# Patient Record
Sex: Female | Born: 1935
Health system: Southern US, Community
[De-identification: ages and names within clinical notes are randomized; demographics above are authoritative.]

## PROBLEM LIST (undated history)

## (undated) DIAGNOSIS — M4800 Spinal stenosis, site unspecified: Secondary | ICD-10-CM

## (undated) DIAGNOSIS — I493 Ventricular premature depolarization: Secondary | ICD-10-CM

## (undated) DIAGNOSIS — N1831 Chronic kidney disease, stage 3a: Secondary | ICD-10-CM

## (undated) DIAGNOSIS — R0600 Dyspnea, unspecified: Secondary | ICD-10-CM

## (undated) DIAGNOSIS — G629 Polyneuropathy, unspecified: Secondary | ICD-10-CM

## (undated) DIAGNOSIS — I447 Left bundle-branch block, unspecified: Secondary | ICD-10-CM

## (undated) DIAGNOSIS — E7800 Pure hypercholesterolemia, unspecified: Secondary | ICD-10-CM

## (undated) DIAGNOSIS — R7303 Prediabetes: Secondary | ICD-10-CM

## (undated) DIAGNOSIS — C801 Malignant (primary) neoplasm, unspecified: Secondary | ICD-10-CM

## (undated) DIAGNOSIS — T8859XA Other complications of anesthesia, initial encounter: Secondary | ICD-10-CM

## (undated) DIAGNOSIS — Z95810 Presence of automatic (implantable) cardiac defibrillator: Secondary | ICD-10-CM

## (undated) DIAGNOSIS — I509 Heart failure, unspecified: Secondary | ICD-10-CM

## (undated) DIAGNOSIS — I5022 Chronic systolic (congestive) heart failure: Secondary | ICD-10-CM

## (undated) DIAGNOSIS — I428 Other cardiomyopathies: Secondary | ICD-10-CM

## (undated) DIAGNOSIS — K219 Gastro-esophageal reflux disease without esophagitis: Secondary | ICD-10-CM

## (undated) DIAGNOSIS — Z950 Presence of cardiac pacemaker: Secondary | ICD-10-CM

## (undated) DIAGNOSIS — I10 Essential (primary) hypertension: Secondary | ICD-10-CM

## (undated) DIAGNOSIS — R079 Chest pain, unspecified: Secondary | ICD-10-CM

## (undated) DIAGNOSIS — E039 Hypothyroidism, unspecified: Secondary | ICD-10-CM

## (undated) DIAGNOSIS — M48062 Spinal stenosis, lumbar region with neurogenic claudication: Secondary | ICD-10-CM

## (undated) DIAGNOSIS — T4145XA Adverse effect of unspecified anesthetic, initial encounter: Secondary | ICD-10-CM

## (undated) DIAGNOSIS — T82198A Other mechanical complication of other cardiac electronic device, initial encounter: Secondary | ICD-10-CM

## (undated) DIAGNOSIS — F32A Depression, unspecified: Secondary | ICD-10-CM

## (undated) DIAGNOSIS — M1990 Unspecified osteoarthritis, unspecified site: Secondary | ICD-10-CM

## (undated) HISTORY — PX: KNEE ARTHROSCOPY: SHX127

## (undated) HISTORY — DX: Spinal stenosis, lumbar region with neurogenic claudication: M48.062

## (undated) HISTORY — DX: Other cardiomyopathies: I42.8

## (undated) HISTORY — PX: VAGINAL HYSTERECTOMY: SUR661

## (undated) HISTORY — PX: FOOT ARTHRODESIS, MODIFIED MCBRIDE: SUR52

## (undated) HISTORY — PX: EXCISIONAL HEMORRHOIDECTOMY: SHX1541

## (undated) HISTORY — PX: APPENDECTOMY: SHX54

## (undated) HISTORY — DX: Unspecified osteoarthritis, unspecified site: M19.90

## (undated) HISTORY — DX: Chest pain, unspecified: R07.9

## (undated) HISTORY — DX: Pure hypercholesterolemia, unspecified: E78.00

## (undated) HISTORY — DX: Spinal stenosis, site unspecified: M48.00

## (undated) HISTORY — DX: Other mechanical complication of other cardiac electronic device, initial encounter: T82.198A

## (undated) HISTORY — DX: Essential (primary) hypertension: I10

## (undated) HISTORY — PX: TONSILLECTOMY: SUR1361

## (undated) HISTORY — PX: BLEPHAROPLASTY: SUR158

## (undated) HISTORY — DX: Left bundle-branch block, unspecified: I44.7

## (undated) HISTORY — DX: Gastro-esophageal reflux disease without esophagitis: K21.9

## (undated) HISTORY — DX: Hypothyroidism, unspecified: E03.9

## (undated) HISTORY — PX: TUBAL LIGATION: SHX77

---

## 1997-08-13 ENCOUNTER — Ambulatory Visit (HOSPITAL_COMMUNITY): Admission: RE | Admit: 1997-08-13 | Discharge: 1997-08-13 | Payer: Self-pay | Admitting: Family Medicine

## 1997-12-03 ENCOUNTER — Ambulatory Visit (HOSPITAL_COMMUNITY): Admission: RE | Admit: 1997-12-03 | Discharge: 1997-12-03 | Payer: Self-pay | Admitting: Gastroenterology

## 1998-12-19 ENCOUNTER — Encounter: Admission: RE | Admit: 1998-12-19 | Discharge: 1998-12-19 | Payer: Self-pay | Admitting: Family Medicine

## 1998-12-19 ENCOUNTER — Encounter: Payer: Self-pay | Admitting: Family Medicine

## 1999-01-05 ENCOUNTER — Encounter: Payer: Self-pay | Admitting: Emergency Medicine

## 1999-01-05 ENCOUNTER — Inpatient Hospital Stay (HOSPITAL_COMMUNITY): Admission: EM | Admit: 1999-01-05 | Discharge: 1999-01-07 | Payer: Self-pay | Admitting: Emergency Medicine

## 2000-05-28 ENCOUNTER — Emergency Department (HOSPITAL_COMMUNITY): Admission: EM | Admit: 2000-05-28 | Discharge: 2000-05-28 | Payer: Self-pay

## 2000-05-29 ENCOUNTER — Emergency Department (HOSPITAL_COMMUNITY): Admission: EM | Admit: 2000-05-29 | Discharge: 2000-05-29 | Payer: Self-pay | Admitting: Emergency Medicine

## 2000-06-01 ENCOUNTER — Ambulatory Visit (HOSPITAL_COMMUNITY): Admission: RE | Admit: 2000-06-01 | Discharge: 2000-06-01 | Payer: Self-pay | Admitting: Family Medicine

## 2000-06-01 ENCOUNTER — Encounter: Payer: Self-pay | Admitting: Family Medicine

## 2001-01-13 ENCOUNTER — Other Ambulatory Visit: Admission: RE | Admit: 2001-01-13 | Discharge: 2001-01-13 | Payer: Self-pay | Admitting: *Deleted

## 2001-02-16 ENCOUNTER — Encounter: Payer: Self-pay | Admitting: Family Medicine

## 2001-02-16 ENCOUNTER — Encounter: Admission: RE | Admit: 2001-02-16 | Discharge: 2001-02-16 | Payer: Self-pay | Admitting: Family Medicine

## 2001-03-09 ENCOUNTER — Ambulatory Visit (HOSPITAL_COMMUNITY): Admission: RE | Admit: 2001-03-09 | Discharge: 2001-03-09 | Payer: Self-pay | Admitting: Gastroenterology

## 2001-03-09 ENCOUNTER — Encounter (INDEPENDENT_AMBULATORY_CARE_PROVIDER_SITE_OTHER): Payer: Self-pay | Admitting: Specialist

## 2001-09-29 ENCOUNTER — Encounter: Payer: Self-pay | Admitting: Family Medicine

## 2001-09-29 ENCOUNTER — Ambulatory Visit (HOSPITAL_COMMUNITY): Admission: RE | Admit: 2001-09-29 | Discharge: 2001-09-29 | Payer: Self-pay | Admitting: Family Medicine

## 2002-09-20 ENCOUNTER — Encounter: Payer: Self-pay | Admitting: General Surgery

## 2002-09-21 ENCOUNTER — Encounter: Payer: Self-pay | Admitting: General Surgery

## 2002-09-21 ENCOUNTER — Ambulatory Visit (HOSPITAL_COMMUNITY): Admission: RE | Admit: 2002-09-21 | Discharge: 2002-09-21 | Payer: Self-pay | Admitting: General Surgery

## 2002-09-26 ENCOUNTER — Encounter (INDEPENDENT_AMBULATORY_CARE_PROVIDER_SITE_OTHER): Payer: Self-pay

## 2002-09-26 ENCOUNTER — Ambulatory Visit (HOSPITAL_COMMUNITY): Admission: RE | Admit: 2002-09-26 | Discharge: 2002-09-26 | Payer: Self-pay | Admitting: General Surgery

## 2002-12-21 ENCOUNTER — Encounter: Admission: RE | Admit: 2002-12-21 | Discharge: 2002-12-21 | Payer: Self-pay | Admitting: Family Medicine

## 2004-02-14 ENCOUNTER — Other Ambulatory Visit: Admission: RE | Admit: 2004-02-14 | Discharge: 2004-02-14 | Payer: Self-pay | Admitting: Family Medicine

## 2004-03-03 ENCOUNTER — Ambulatory Visit (HOSPITAL_COMMUNITY): Admission: RE | Admit: 2004-03-03 | Discharge: 2004-03-03 | Payer: Self-pay | Admitting: Gastroenterology

## 2004-03-04 ENCOUNTER — Encounter: Admission: RE | Admit: 2004-03-04 | Discharge: 2004-03-04 | Payer: Self-pay | Admitting: Family Medicine

## 2005-03-11 ENCOUNTER — Encounter: Admission: RE | Admit: 2005-03-11 | Discharge: 2005-03-11 | Payer: Self-pay | Admitting: Family Medicine

## 2005-04-02 ENCOUNTER — Other Ambulatory Visit: Admission: RE | Admit: 2005-04-02 | Discharge: 2005-04-02 | Payer: Self-pay | Admitting: Family Medicine

## 2005-04-23 ENCOUNTER — Inpatient Hospital Stay (HOSPITAL_COMMUNITY): Admission: EM | Admit: 2005-04-23 | Discharge: 2005-04-29 | Payer: Self-pay | Admitting: Emergency Medicine

## 2005-04-27 ENCOUNTER — Encounter (INDEPENDENT_AMBULATORY_CARE_PROVIDER_SITE_OTHER): Payer: Self-pay | Admitting: *Deleted

## 2005-04-29 ENCOUNTER — Encounter: Payer: Self-pay | Admitting: *Deleted

## 2005-06-20 ENCOUNTER — Emergency Department (HOSPITAL_COMMUNITY): Admission: EM | Admit: 2005-06-20 | Discharge: 2005-06-20 | Payer: Self-pay | Admitting: Emergency Medicine

## 2006-03-12 ENCOUNTER — Encounter: Admission: RE | Admit: 2006-03-12 | Discharge: 2006-03-12 | Payer: Self-pay | Admitting: Family Medicine

## 2006-11-03 ENCOUNTER — Inpatient Hospital Stay (HOSPITAL_COMMUNITY): Admission: RE | Admit: 2006-11-03 | Discharge: 2006-11-07 | Payer: Self-pay | Admitting: Neurosurgery

## 2006-11-03 HISTORY — PX: LUMBAR LAMINECTOMY: SHX95

## 2007-04-21 ENCOUNTER — Encounter: Admission: RE | Admit: 2007-04-21 | Discharge: 2007-04-21 | Payer: Self-pay | Admitting: Family Medicine

## 2008-04-20 ENCOUNTER — Encounter: Admission: RE | Admit: 2008-04-20 | Discharge: 2008-04-20 | Payer: Self-pay | Admitting: Gastroenterology

## 2008-04-23 ENCOUNTER — Encounter: Admission: RE | Admit: 2008-04-23 | Discharge: 2008-04-23 | Payer: Self-pay | Admitting: Family Medicine

## 2009-03-25 ENCOUNTER — Encounter: Admission: RE | Admit: 2009-03-25 | Discharge: 2009-03-25 | Payer: Self-pay | Admitting: Family Medicine

## 2009-03-28 ENCOUNTER — Encounter: Admission: RE | Admit: 2009-03-28 | Discharge: 2009-03-28 | Payer: Self-pay | Admitting: Family Medicine

## 2009-05-15 ENCOUNTER — Encounter: Admission: RE | Admit: 2009-05-15 | Discharge: 2009-05-15 | Payer: Self-pay | Admitting: Family Medicine

## 2010-01-02 ENCOUNTER — Ambulatory Visit
Admission: RE | Admit: 2010-01-02 | Discharge: 2010-01-02 | Payer: Self-pay | Source: Home / Self Care | Admitting: Orthopedic Surgery

## 2010-01-02 ENCOUNTER — Encounter (INDEPENDENT_AMBULATORY_CARE_PROVIDER_SITE_OTHER): Payer: Self-pay | Admitting: Orthopedic Surgery

## 2010-01-08 ENCOUNTER — Emergency Department (HOSPITAL_COMMUNITY)
Admission: EM | Admit: 2010-01-08 | Discharge: 2010-01-08 | Payer: Self-pay | Source: Home / Self Care | Admitting: Emergency Medicine

## 2010-02-22 ENCOUNTER — Encounter: Payer: Self-pay | Admitting: Family Medicine

## 2010-04-14 LAB — CBC
HCT: 40.5 % (ref 36.0–46.0)
Hemoglobin: 13.8 g/dL (ref 12.0–15.0)
MCH: 30.8 pg (ref 26.0–34.0)
MCHC: 34.1 g/dL (ref 30.0–36.0)
MCV: 90.4 fL (ref 78.0–100.0)
Platelets: 192 10*3/uL (ref 150–400)
RBC: 4.48 MIL/uL (ref 3.87–5.11)
RDW: 13.4 % (ref 11.5–15.5)
WBC: 5.5 10*3/uL (ref 4.0–10.5)

## 2010-04-14 LAB — URINALYSIS, ROUTINE W REFLEX MICROSCOPIC
Bilirubin Urine: NEGATIVE
Glucose, UA: NEGATIVE mg/dL
Hgb urine dipstick: NEGATIVE
Nitrite: NEGATIVE
pH: 5.5 (ref 5.0–8.0)

## 2010-04-14 LAB — URINE CULTURE

## 2010-04-14 LAB — COMPREHENSIVE METABOLIC PANEL
ALT: 19 U/L (ref 0–35)
AST: 21 U/L (ref 0–37)
Albumin: 4.1 g/dL (ref 3.5–5.2)
CO2: 27 mEq/L (ref 19–32)
Chloride: 105 mEq/L (ref 96–112)
Creatinine, Ser: 0.94 mg/dL (ref 0.4–1.2)
GFR calc Af Amer: >60 mL/min (ref >60)
GFR calc non Af Amer: 58 mL/min — ABNORMAL LOW (ref >60)
Potassium: 4.5 mEq/L (ref 3.5–5.1)
Sodium: 140 mEq/L (ref 135–145)
Total Bilirubin: 0.9 mg/dL (ref 0.3–1.2)

## 2010-04-14 LAB — DIFFERENTIAL
Basophils Absolute: 0 10*3/uL (ref 0.0–0.1)
Eosinophils Absolute: 0.1 10*3/uL (ref 0.0–0.7)
Eosinophils Relative: 1 % (ref 0–5)
Lymphocytes Relative: 40 % (ref 12–46)
Monocytes Absolute: 0.6 10*3/uL (ref 0.1–1.0)

## 2010-06-17 NOTE — H&P (Signed)
NAMELANAE, FEDERER NO.:  [id number]   MEDICAL RECORD NO.:  [id number]          PATIENT TYPE:  INP   LOCATION:  3172                         FACILITY:  [facility]   PHYSICIAN:  Hilda Lias, M.D.   DATE OF BIRTH:  [birth year]   DATE OF ADMISSION:  11/03/2006  DATE OF DISCHARGE:                              HISTORY & PHYSICAL   HISTORY:  [name] is a lady who had been complaining of back pain  that radiates to both legs.  The patient would sit down for long period  of time and associated with weakness when she walks.  The pain is  getting worse to the point that she had been gaining weight due to low  activity.  She complains of pain in both knees.   PAST MEDICAL HISTORY:  1. Tubal ligation.  2. Foot surgery.   ALLERGIES:  BENADRYL.   SOCIAL HISTORY:  Negative.   FAMILY HISTORY:  Unremarkable.   REVIEW OF SYSTEMS:  Positive for high blood pressure, high cholesterol,  thyroid disease.   PHYSICAL EXAMINATION:  HEENT:  Normal.  NECK:  Normal.  LUNGS: Clear.  HEART: Sounds normal.  ABDOMEN:  Normal.  EXTREMITIES:  Normal.  NEUROLOGIC:  Showed that she had weakness on dorsiflexion and palpation  of both legs.  Straight leg raising (SLR) is positive about 45 degrees  with stretch, positive bilateral.   X-RAYS:  Lumbar x-ray as well as the MRI showed that she has lumbar  stenosis, from L3 down to L5-S1.  Question about the level L2-L3.   CLINICAL IMPRESSION:  Lumbar stenosis with a neurogenic claudication.   RECOMMENDATIONS:  The patient being admitted for surgery.  The procedure  will be bilateral L3, L4 L5 and  probably L2 laminectomy with a  posterolateral arthrodesis and autograft.  The surgery was well  explained to her and her husband with the possibility of infection, CSF  leak, damage to the distal vertebra and infection, need for further  surgery, and no improvement whatsoever.           ______________________________  Hilda Lias,  M.D.     EB/MEDQ  D:  11/03/2006  T:  11/03/2006  Job:  [id number]

## 2010-06-17 NOTE — Op Note (Signed)
NAMEARIEAL, CUOCO NO.:  [id number]   MEDICAL RECORD NO.:  [id number]          PATIENT TYPE:  INP   LOCATION:  3172                         FACILITY:  [facility]   PHYSICIAN:  Hilda Lias, M.D.   DATE OF BIRTH:  February 23, 1932   DATE OF PROCEDURE:  11/03/2006  DATE OF DISCHARGE:                               OPERATIVE REPORT   PREOPERATIVE DIAGNOSIS:  Lumbar stenosis, L3-L5, with neurogenic  claudication.   POSTOPERATIVE DIAGNOSIS:  Lumbar stenosis, L3-L5, with neurogenic  claudication.   PROCEDURE:  Bilateral 3, 4, 5 laminectomy, partial L2, decompression of  the thecal sac, foraminotomy, posterolateral arthrodesis L3-S1 with  autograft.   SURGEON:  Hilda Lias, M.D.   ASSISTANT:  Danae Orleans. Venetia Maxon, M.D.   CLINICAL HISTORY:  Heather Smith is admitted because of back pain  radiating to both legs.  She has a typical neurogenic claudication.  X-  rays show stenosis of L3 down to L5-S1.  It was moderate between L2 and  23.  Surgery was advised.  The risks were explained to her in the  history and physical.   PROCEDURE:  The patient was taken to the OR, and she was positioned in  prone manner.  The skin was cleaned with DuraPrep.  Drapes were applied.  Midline incision from the L2-L3 down to L5-S1 was made.  Muscles were  retracted laterally until we were able to see the lateral aspect of the  facet.  From then on with the [place], we removed the spinous process of  5, 4, 3 and partial of 2.  With the drill, we did a laminectomy.  The  worst area of narrowing was between 4-5 and 3-4 to the point that the  dura mater was quite thin.  The patient had quite a bit of overgrowth of  the yellow ligament, and using the Kerrison punch, we were able to  remove the ligament.  At the end, we had good wide decompression.  We  looked at the level of L2-3, and we undermined the spinous process of  the lamina of L2, and we were able to decompress the thecal sac in that  area.   Then, using the 2- and 3-mm Kerrison punch, we decompressed the  3, 4, 5 and S1 nerve root.  Having good decompression, the same bone  that was removed from the spinous process was cleaned.  The lateral  aspect of the facet of 3-4, 4-5, 5-1 were removed from the periosteum,  and the bone graft was used for posterolateral arthrodesis bilaterally.  The area was irrigated.  A Valsalva maneuver was negative.  Fentanyl was  left in the epidural space, and the wound was closed with Vicryl and  Steri-Strips.           ______________________________  Hilda Lias, M.D.     EB/MEDQ  D:  11/03/2006  T:  11/03/2006  Job:  [id number]

## 2010-06-17 NOTE — Discharge Summary (Signed)
NAMEZIMAL, WEISENSEL NO.:  [id number]   MEDICAL RECORD NO.:  [id number]          PATIENT TYPE:  INP   LOCATION:  5124                         FACILITY:  [facility]   PHYSICIAN:  Coletta Memos, M.D.     DATE OF BIRTH:  Jun 27, 1932   DATE OF ADMISSION:  11/03/2006  DATE OF DISCHARGE:  11/07/2006                               DISCHARGE SUMMARY   ADMITTING DIAGNOSIS:  Lumbar stenosis with neurogenic claudication.   PROCEDURES:  1. Lumbar decompression, L2-L3.  2. L4-L5 laminectomy.  3. L3-S1 posterolateral arthrodesis with morselized autograft.   COMPLICATIONS:  None.   DISPOSITION:  Heather Smith will be discharged home today.  She is  voiding and ambulating without difficulty.  She has pain in the lumbar  spine, but that is to be expected.  She is otherwise doing well.  She  will have return appointment to see Dr. Venetia Maxon in 3-4 weeks.  Wound is  clean, dry and no signs of infection.           ______________________________  Coletta Memos, M.D.     KC/MEDQ  D:  11/07/2006  T:  11/08/2006  Job:  [id number]

## 2010-06-20 NOTE — Consult Note (Signed)
NAMEHARLY, PIPKINS              ACCOUNT NO.:  [id number]   MEDICAL RECORD NO.:  [id number]          PATIENT TYPE:  OBV   LOCATION:  1011                         FACILITY:  [facility]   PHYSICIAN:  Pramod P. Pearlean Brownie, MD    DATE OF BIRTH:  07/04/1932   DATE OF CONSULTATION:  DATE OF DISCHARGE:                                   CONSULTATION   REASON FOR REFERRAL:  Dizziness.   HISTORY OF PRESENT ILLNESS:  Mrs. Heather Smith is a 77 year old pleasant  Caucasian lady who developed sudden onset of dizziness, nausea and vomiting  and gait ataxia with leaning to the right side on Monday evening.  She  noticed this when she was getting ready for bed and turning to the right.  She denied true vertigo, blurred vision, double vision, or focal extremity  weakness.  When her husband helped her walk to the car to come to the  hospital, he noticed that she was leaning to the right.  Subsequently, also,  when she got several times to go to the restroom in the hospital, she was  leaning consistently to the right side.  She had been treated for the last  couple of days with meclizine and Valium which seems to be helping and today  her dizziness is much better.  She has been seen by ENT who did not find any  evidence of labyrinthitis.  She has no complaints of ringing in the ears,  decreased hearing, or previous history of vertigo.  She has no known history  of stroke, but does have vascular  type of hypertension, hyperlipidemia.   PAST MEDICAL HISTORY:  Hypothyroidism, arthritis, gastroesophageal reflux  disease, hypertension, hyperlipidemia.   HOME MEDICATIONS:  Altace, Synthroid, Mobic, and aspirin.   MEDICATION ALLERGIES:  BENADRYL.   SOCIAL HISTORY:  Patient is married, lives with her husband, does not smoke  or drink.   FAMILY HISTORY:  Significant for ischemic heart disease, but nobody with a  stroke.   REVIEW OF SYSTEMS:  Not significant for any chest pain, fever, cough,  shortness of breath,  diarrheal illness.   PHYSICAL EXAM:  GENERAL:  Physical exam reveals a pleasant, elderly  Caucasian lady who is not in distress.  VITAL SIGNS:  She is afebrile.  Pulse rate is 78 per minute, regular.  Respiratory 16 per minute.  Distal pulses are well felt.  HEENT:  Head is nontraumatic.  Neck is supple without bruit.  ENT EXAM:  At present, is unremarkable.  CARDIAC EXAM:  Regular heart sounds.  NEUROLOGICAL EXAM:  She is pleasant, awake, alert, cooperative.  There is no  aphasia, apraxia or dysarthria.  Pupils are equal, reactive.  Eye movements  are full range without any nystagmus.  Head shaking was also negative for  any dizziness or vertigo or nystagmus.  Face is symmetric.  Palatal  movements are normal.  Tongue is midline.  Motor system exam reveals no  upper extremity deficits; symmetric strength, tone, reflexes, coordination,  sensation.  Plantars are downgoing.  She walks with a slow, cautious gait.  She is not unsteady or narrow-based while walking  on her heels or toes.  She  can stand on either foot unsupported.   DATA REVIEW:  MRI scan of the brain done yesterday reveals no acute infarct.  Mild degree of nonspecific white matter microangiopathic changes and  generalized atrophy seen.  A CSF __________ lesion is seen in the subinsular  region, which may represent a benign CSF cyst.  A small lacunar infarct was  noted in the ventral left thalamus.   IMPRESSION:  [age]-year-old lady with sudden onset of gait imbalance,  nausea, leaning to the right, and headache possibly from a small right-sided  brainstem or cerebellar infarct not visualized on the MRI. Vascular risk  factors of age, sex, hypertension, hyperlipidemia, and previous silent  cerebrovascular disease.  The etiology is most likely small vessel disease  from above.   PLAN:  I would recommend further evaluation with MRI of the brain and neck  to rule out any high-grade intracranial or extracranial vascular  stenosis.  Strict control of hypertension with systolic blood pressure goals below 130,  and hyperlipidemia with total cholesterol goal below 200, and LDL goal below  100.  Change aspirin to Aggrenox for secondary stroke prevention.  Also,  repeat a limited diffusion weighted imaging, MRI scan of the brain, to look  for small infarct missed on previous MRI.  I had a long discussion with the  patient and her husband.  She is to continue meclizine for symptomatic  relief and taper it gradually over the next several weeks as tolerated.  She  may follow up with me relatively as an outpatient in 2 months or call  earlier if necessary.  Thank you for the referral.           ______________________________  Sunny Schlein. Pearlean Brownie, MD     PPS/MEDQ  D:  04/23/2005  T:  04/25/2005  Job:  [id number]

## 2010-06-20 NOTE — Procedures (Signed)
[facility]  Patient:    Heather Smith, Heather Smith Visit Number: [id number] MRN: [id number]          Service Type: END Location: ENDO Attending Physician:  Orland Mustard Dictated by:   Llana Aliment. Randa Evens, M.D. Proc. Date: 03/09/01 Admit Date:  03/09/2001   CC:         Arvella Merles, M.D.   Procedure Report  PROCEDURE PERFORMED:  Colonoscopy and coagulation of polyp.  ENDOSCOPIST:  Llana Aliment. Randa Evens, M.D.  MEDICATIONS USED:  Versed 70 mg, fentanyl 125 mcg IV  INSTRUMENT:  INDICATIONS:  Rectal bleeding.  Strong family history of colon cancer.  DESCRIPTION OF PROCEDURE:  The procedure had been explained to the patient and consent obtained.  With the patient in the left lateral decubitus position, the Olympus pediatric video colonoscope was inserted and advanced under direct visualization.  The prep was quite good and we were able to advance to the cecum without difficulty.  The ileocecal valve and appendiceal orifice were seen.  The scope was withdrawn.  The cecum, ascending colon, hepatic flexure, transverse colon, splenic flexure, descending colon were seen well well.  In the middescending colon, a polyp was seen, 2 to 3 mm in diameter and this was cauterized.  The remainder of the descending colon and sigmoid colon were seen well and were unremarkable.  There were internal hemorrhoids seen in the rectum upon removal of the scope.  The scope withdrawn, patient tolerated the procedure well.  Maintained on low flow oxygen and pulse oximeter throughout the procedure.  ASSESSMENT:  Ascending colon polyp removed.  PLAN: 1. Routine post polypectomy instructions. 2. Will recommend repeating in three years due to patients strong family    history of colon cancer and anticipation this will prove to be an    adenomatous polyp. Dictated by:   Llana Aliment. Randa Evens, M.D. Attending Physician:  Orland Mustard DD:  03/09/01 TD:  03/09/01 Job:  [id number]

## 2010-06-20 NOTE — H&P (Signed)
Heather Smith, Heather Smith              ACCOUNT NO.:  [id number]   MEDICAL RECORD NO.:  [id number]          PATIENT TYPE:  INP   LOCATION:  1428                         FACILITY:  [facility]   PHYSICIAN:  Melissa L. Ladona Ridgel, MD  DATE OF BIRTH:  [birth year]   DATE OF ADMISSION:  04/22/2005  DATE OF DISCHARGE:  04/29/2005                                HISTORY & PHYSICAL   CHIEF COMPLAINT ON ADMISSION:  Dizziness.   DISCHARGE DIAGNOSES:  1.  Possible small right cerebellar stroke.  This is based on an exam      finding by neurology as gait disturbance on the right with dizziness.      Concurrent MRIs failed to show a true defect.  This is presumption based      on physical examination and symptomatology at the time of her complaint.      The patient was seen and evaluated by ENT and neurology for      symptomatology.  ENT did not feel this was an inner ear issue and noted      that the patient's blood pressure was quite elevated during the      episodes.  Neurology has decided that this was probable right cerebellar      cerebrovascular accident with complications of not tolerating AGGRENOX.      She, therefore, will be sent home on aspirin and intensive blood      pressure control as well as cholesterol control.  She can follow up with      Dr. Pearlean Brownie in the outpatient setting in 3 to 4 weeks.  His phone number      has been provided.  2.  Severe headaches.  This is associated with hypertensive urgency.  A      formal workup, therefore, was undertaken.  The differential diagnoses      included the [facility] which was discontinued.  The patient was placed on      aspirin only for possible stroke. Further workup of the headache and      hypertensive urgency included MRA of the kidneys to rule out renal      artery stenosis.  This was negative.  Cardiolite stress testing was      negative, and 2-D echocardiogram showed no significant defects.  3.  Hypertensive urgency. As stated, her renal artery  stenosis workup was      negative.  Cardiolite was essentially negative.  I have increased ACE      inhibitor to twice daily dosing to better control her blood pressure.  I      would appreciate if she could have a creatinine drawn next week to make      sure that her renal function is okay on the extra ACE inhibitor.  I      would like her to limit her NSAIDs to prevent renal failure.  4.  Hypercholesterolemia. We have increased Zocor to 40 mg nightly.  That      will continue.  5.  Constipation. We will continue her on Metamucil and over-the-counter      stool softeners.  6.  Hypothyroidism. She is to continue Synthroid.  7.  Gastroesophageal reflux disease.  She will resume Aciphex at home.  8.  Chronic left bundle branch block.  Cardiolite stress testing was      negative, and she can follow up with Dr. Fraser Din as an outpatient as      needed.   DISCHARGE MEDICATIONS:  1.  Altace 10 mg twice daily.  2.  Synthroid 50 mcg daily.  3.  Aciphex 20 mg.  4.  Xanax 0.25 mg every 6 hours p.r.n.  5.  Mobic as previously. I have requested that she please hold the Mobic if      she is using Toradol, and I am limiting the Toradol to only 2 days'      worth of medicine.  I would like to decrease the NSAID effect on her      kidney function.  6.  Metamucil can be used as needed.  7.  Aspirin 325 mg once daily.  8.  Zocor 40 mg once at bedtime.  9.  Toradol 10 mg every 4 hours p.r.n. as needed for headache.  She is not      exceed continuous use for more than 2 days.  I will only be providing a      limited supply.  10. I have asked her to hold Requip until she sees Dr. Tiburcio Pea.   She is instructed to follow up with Dr. Tiburcio Pea next week as a post hospital  visit.  She is instructed to see Dr. Pearlean Brownie in 3 to 4 weeks, phone number [phone].  She is instructed to follow up with Dr. Fraser Din as needed.   SPECIAL INSTRUCTIONS:  If she has further headaches, she is to please  contact Dr. Tiburcio Pea'  office or come to the emergency room.   CONSULTATIONS:  Meade Maw, M.D.  Pramod P. Pearlean Brownie, M.D.  Antony Contras, M.D.   PERTINENT STUDIES:  The patient underwent MRA of the abdomen which shows no  evidence for renal artery stenosis and no aortic aneurysmal disease.   The patient underwent initially on admission an MRI of the brain which  showed mild atrophy, scattered lacunes, prominent perivascular space and  chronic microvascular ischemic changes.  No acute stroke, no abnormal  intracranial enhancement, no temporal bone or paranasal sinus inflammatory  process.   Her CT head also showed no acute finding.   Her repeat MRI with MRA again suggested a possible 50% stenosis of the left  vertebral artery.  The remainder of the examination demonstrated patency of  the vessels examined.  There were no other abnormalities noted on the MR  angiogram.  There was no evidence of effusion, stenosis, dissection, or  intracranial aneurysm.  Repeat study showed no interval development of acute  ischemia.   Ultrasound of the kidneys were completed which showed normal studies.   A 2-D echocardiogram was completed which showed overall left ventricular  systolic function with lower limits of normal.  The study was inadequate for  evaluation of wall motion.  There was moderate dyssynergic motion of the  intraventricular septum consistent with conduction abnormality or paced  rhythm.  No obvious source for embolism was noted.   HISTORY OF PRESENT ILLNESS:  The patient is a 77 year old white female who  presented to the emergency room when she developed the acute onset of  imbalance after turning in bed.  The patient states she sat up and felt  faint as if she  were going to fall.  From that point forwards, she was  unable to walk because of falling to the side, mainly to the right. The patient felt nauseated and vomited.  She said the room was spinning, and  then she described feeling faint.  The  patient was noted in the emergency  room to have a blood pressure of 196/110.  She denied any changes of ringing  or ear pain.  She also denied visual changes.  In the emergency room, the  patient was noted to be hypertensive.  She was admitted for further workup  of her symptomatology.   The patient underwent CT scan of the head which showed no acute findings and  subsequently MRI and MRA of the had and neck.  Please note, physical  examination did show imbalance to the right, suggesting possible right  cerebellar lesion.  MRI/MRA, however, did not confirm acute infarct.  The  patient, therefore, was diagnosed with a probable small not detected on scan  right cerebellar infarct.  She was placed on Aggrenox, and secondary causes  were pursued.   Initially, the patient did see ear, nose, and throat to rule out possible  acute labyrinthitis.  Determination was that this was likely not the case,  and that is when further neurological workup was undertaken.   By March 24, which is the third day of her admission, the patient had  developed the acute onset of severe crushing head pain with associated  nausea, vomiting, and dizziness.  She also displayed left eye ptosis with  the finding also of hypertensive urgency related to this.  The pain was  responsive to narcotics and anti-inflammatory pain medications; however, the  cyclic nature of the headaches caused some suspicion as to the source.  Because of the suspicious nature of her symptomatology, further workup was  undertaken.  MRA of the abdomen was done to rule out possible renal artery  stenosis causing slightly hypertensive changes resulting in headaches.  The  MR angiogram was negative for renal artery stenosis. Subsequently the  patient was noted on monitor to have left bundle branch block which was not  previously mentioned, and this was in conjunction with a bradyarrhythmia.  The patient, therefore, had Aggrenox discontinued.   Labetalol was  discontinued, and she underwent further cardiac evaluation.  A 2-D  echocardiogram showed, as stated, intraventricular dyssynergy, possibly  consistent with a conduction abnormality.  She underwent stress testing  which was negative for acute ischemia.  After the discontinuation of the  Aggrenox, the patient's headache symptoms seemed to improve slightly.  She  did get some premonition headaches which was able to be controlled with  Toradol. The patient subsequently seemed well enough to discharge to home to  follow up with a headache journal and her primary care physician.   On the day of discharge, the patient was clinically significantly improved.  Her temperature was 98, blood pressure 139/81 with the last hypertensive  reading on March 27 at 1700.  ON the day of discharge, the patient seemed tired but in good spirits.  She was normocephalic and atraumatic.  Pupils  equal, round, and reactive to light.  Extraocular muscles appeared to be  intact.  She did have slight ptosis of the left eye at baseline which was  much worse during her hypertensive and headache episodes.  Mucous membranes  moist.  Neck supple.  No JVD, no lymphadenopathy, no carotid bruits.  Her  chest was clear to auscultation. There was  no rhonchi, rales, or wheeze.  Cardiovascular was regular rate and rhythm, positive S1 and S2.  No S3, S4,  murmur, rub, or gallop.  Heart sounds are distant . Abdomen soft, nontender,  nondistended with positive bowel sounds.  Extremities showed no clubbing,  cyanosis, or edema. Neurologically, the patient appeared grossly intact with  the exception of the left eye ptosis.   PERTINENT LABORATORY DATA:  The patient's TSH was 0.772.  Her discharging  BUN was 16 with a creatinine of 0.8, potassium of 3.8 and sodium of 141.  Homocysteine level was within normal limits at 10.7 . Lipid profile revealed  a total cholesterol of 108, triglycerides of 180, LDL of 132, HDL of  40.  Her hemoglobin A1c was within normal limits at 5.6.   At this time, the patient is deemed stable for discharge to follow up with  Dr. Tiburcio Pea, Dr. Pearlean Brownie, and [place] as directed.   DISPOSITION:  To home with her spouse.      Melissa L. Ladona Ridgel, MD  Electronically Signed     MLT/MEDQ  D:  04/29/2005  T:  04/29/2005  Job:  [id number]

## 2010-06-20 NOTE — Op Note (Signed)
Heather Smith, Heather Smith                        ACCOUNT NO.:  [id number]   MEDICAL RECORD NO.:  [id number]                   PATIENT TYPE:  AMB   LOCATION:  DAY                                  FACILITY:  [facility]   PHYSICIAN:  Timothy E. Earlene Plater, M.D.              DATE OF BIRTH:  09-29-1932   DATE OF PROCEDURE:  09/26/2002  DATE OF DISCHARGE:                                 OPERATIVE REPORT   PREOPERATIVE DIAGNOSIS:  Internal/external hemorrhoids.   POSTOPERATIVE DIAGNOSIS:  Internal/external hemorrhoids.   PROCEDURE:  Hemorrhoidectomy.   SURGEON:  Timothy E. Earlene Plater, M.D.   ANESTHESIA:  General.   INDICATIONS FOR PROCEDURE:  Ms. [name] is a 77 year old, otherwise healthy  with controlled hypertension, some arthritic complaints and hypothyroidism.  She has had hemorrhoids for years intermittently, and because of progressive  disease with prolapse and bleeding she wishes to proceed with surgical  repair (as has been carefully discussed with her and her husband).  She has  been treated in the office conservatively.  She does have a current  colonoscopy.   DESCRIPTION OF PROCEDURE:  The patient was identified, permit signed,  evaluated by anesthesia.   She was taken to the operating room and placed supine.  General endotracheal  anesthesia was administered.  She was placed in the lithotomy position;  prepped and draped in the usual fashion.  Hemorrhoids were prominent in the  left lateral and right anterior positions.  There was a third-degree  internal right posterior and an additional tag in the left posterior  position externally.   The anus was gently dilated to accept the operating anoscope, and then  Marcaine 0.25% with epinephrine mixed 9:1 with Wydase was injected round  about the anal orifice and massaged in well.  The large prolapsing right  anterior complex was removed as a single column.  The left posterior third-  degree hemorrhoid was removed as a complete column.   The left lateral  internal hemorrhoid was band ligated.  The right posterior internal  hemorrhoid was band ligated, and all four external tags were simply excised.   The two full-column excisions were closed with a running 2-0 Chromic to the  anoderm, and then all four of the external tag sites were closed with  running 4-0 Chromic.  The results were very satisfactory.  The sphincter was  not injured and was intact, and there was no bleeding.  Gelfoam gauze and a  dry sterile dressing applied.  She tolerated it well and was removed to the  recovery room in good condition.   Written and verbal instructions were given.  She will be seen and followed  in the office.  This included Percocet #[id number].  Timothy E. Earlene Plater, M.D.    TED/MEDQ  D:  09/26/2002  T:  09/26/2002  Job:  [id number]   cc:   Holley Bouche, M.D.  [address]  [place], [place] [address]  Fax: [phone]

## 2010-06-20 NOTE — Consult Note (Signed)
NAMETARIN, JOHNDROW NO.:  [id number]   MEDICAL RECORD NO.:  [id number]          PATIENT TYPE:  OBV   LOCATION:  0105                         FACILITY:  [facility]   PHYSICIAN:  Antony Contras, MD     DATE OF BIRTH:  12-19-1932   DATE OF CONSULTATION:  04/22/2005  DATE OF DISCHARGE:                                   CONSULTATION   REQUESTING SERVICE:  Hospitalist service.   CHIEF COMPLAINT:  Dizziness.   HISTORY OF PRESENT ILLNESS:  The patient is a 77 year old white female who  felt at her normal state of health until last evening when she acutely  developed imbalance after turning in bed.  She sat up in bed and felt faint,  like she was going to fall.  From that point forward, she has been unable to  walk because of imbalance.  Last evening, she felt nauseated and did vomit.  She denies a room-spinning sensation throughout this problem.  She does  describe poor balance and also feeling like she is going to faint.  She had  a head cold a couple of weeks ago that has resolved.  She denies hearing  changes, ringing in her ear, or ear pain.  She also denies visual changes.  She states that her blood pressure was 196/110 during her ambulance ride.   PAST MEDICAL HISTORY:  1.  Hypertension.  2.  Hypercholesterolemia.  3.  GERD.  4.  Thyroid disorder.   PAST SURGICAL HISTORY:  1.  Arthroscopic knee surgery.  2.  Tubal ligation.   MEDICATIONS:  1.  Altace.  2.  Fish oil.  3.  Supplements.  4.  Vitamins.  5.  Synthroid.   ALLERGIES:  BENADRYL causes jerkiness.   FAMILY HISTORY:  Her mother died of lung cancer and also heart attack.  She  had hypertension.  Her father died of an aneurysm.  She had two brothers  with cancer.  Other cancers in the family include colon and thyroid cancer.   SOCIAL HISTORY:  The patient lives in [place] with her husband.  She does  not smoke.  Does not drink alcohol.  Denies drug use.  She works for a  [employer]   REVIEW OF SYSTEMS:  Systems are otherwise negative except as in the HPI,  aside from the recent event of her husband having a massive heart attack  nine days ago.   PHYSICAL EXAMINATION:  VITAL SIGNS:  Temperature 97.6, pulse 66,  respirations 18, blood pressure 122/74.  Sat 97%.  GENERAL:  The patient is alert in no acute distress.  She is fully oriented  and calm.  EYES:  Extraocular movements are intact.  Pupils are equal, round and  reactive to light.  SKIN:  There are no skin lesions noted on the head and neck.  ORAL CAVITY:  Patient has no lesions in the mouth with extensive dental work  and good dentition otherwise.  She has a venous lake on her upper lip.  NOSE:  Nasal passages are patent without lesion.  Septum is relatively  midline.  EARS:  Tympanic membranes are intact with normal external auditory canals.  External ears are normal.  Middle ears are aerated.  NECK:  There are no masses or lymphadenopathy in the neck.  She has no neck  tenderness.   LABS:  White blood count 7.5, hemoglobin 13.4, platelets 196.  Sodium 139,  potassium 4.3, chloride 106, bicarb 28, BUN 22, creatinine 0.8, glucose 129,  calcium 8.8.   MRI of the head was performed, and the report describes no evidence of acute  stroke or tumor.  The ears and temporal bones are normal on the MRI.   ASSESSMENT:  The patient is a 77 year old white female with acute onset of  imbalance with vomiting last night.  This occurred with the recent history  of her husband having a massive MI.   PLAN:  By the way she describes her symptoms, this does not sound like an  inner ear problem.  Typically, inner ear vertigo is associated with the room-  spinning sensation.  She describes no trouble with hearing changes as well.  I am skeptical that her symptoms are coming from her inner ear.  Other  sources for her symptoms should be investigated further.  If further workup  is to be done, this may include a hearing test  that can be performed as an  outpatient.  I am not convinced that this will be fruitful without any  complaint about her hearing.  If symptoms persist, followup can be  scheduled.      Antony Contras, MD  Electronically Signed     DDB/MEDQ  D:  04/22/2005  T:  04/23/2005  Job:  [id number]

## 2010-06-20 NOTE — H&P (Signed)
NAMESUMER, MOOREHOUSE NO.:  [id number]   MEDICAL RECORD NO.:  [id number]          PATIENT TYPE:  EMS   LOCATION:  ED                           [facility]ACILITY[facility]  [facility]   PHYSICIAN:  Corinna L. Lendell Caprice, MDDATE OF BIRTH:  1932-10-04   DATE OF ADMISSION:  04/22/2005  DATE OF DISCHARGE:                                HISTORY & PHYSICAL   CHIEF COMPLAINT:  Dizziness.   HISTORY OF PRESENT ILLNESS:  Ms. Novitski is a pleasant 77 year old white  female patient of Dr. Tiburcio Pea who presents to the emergency room with sudden  onset of vertigo. She also vomited at home. She was given antiemetics and  meclizine here and feels a little bit better but is unable to ambulate.  She  had a cold recently.  She has no history of stroke.  She has no other  symptoms.   PAST MEDICAL HISTORY:  1.  Hypertension.  2.  Hypothyroidism.  3.  Arthritis.  4.  Gastroesophageal reflux disease.   SOCIAL HISTORY:  Patient is here with her husband and family members. She  does not smoke, drink or have any drug history.   FAMILY HISTORY:  Her brother died of lung cancer.  Her other brother died of  colon cancer.  Her mother died of thyroid cancer.  Her father died of a  thoracic aortic aneurysm complications.   REVIEW OF SYSTEMS:  As above, otherwise negative.   PHYSICAL EXAMINATION:  VITAL SIGNS:  Temperature 97.2, blood pressure  154/98, pulse 74, respiratory rate 20, oxygen saturation 95% on room air.  GENERAL APPEARANCE:  The patient is somewhat uncomfortable-appearing quiet  female.  HEENT:  Normocephalic, atraumatic. Pupils equal, round, reactive to light.  Tympanic membranes are clear.  No nystagmus.  Extraocular movements are  intact. Mucous membranes moist.  NECK:  Supple, no carotid bruits.  No thyromegaly.  LUNGS:  Clear to auscultation bilaterally without wheezes, rhonchi or rales.  CARDIOVASCULAR:  Regular rate and rhythm without murmurs, rubs or gallops.  ABDOMEN:  Normal bowel  sounds, soft, nontender, nondistended.  GENITOURINARY/RECTAL:  Examination's were deferred.  EXTREMITIES:  No cyanosis, clubbing or edema.  NEUROLOGICAL:  Oriented.  Cranial nerves II-XII intact.  Motor strength 5/5  throughout.  Finger-to-nose intact.  I did not stand her up to test Romberg  as she was so symptomatic.  PSYCHIATRIC:  Normal affect.   LABORATORY DATA:  CBC unremarkable.  Complete metabolic panel unremarkable.  Wet reading of the CT scan brain without contrast showed nothing acute.   ASSESSMENT/PLAN:  1.  Vertigo.  I suspect this is labyrinthitis, however, due to her age and      history of hypertension, I will also get an MRI of the brain to rule out      posterior circulation stroke.  She will be placed on 23 hour observation      and supportive care with Meclizine, antiemetics and benzodiazepine's as      needed.  She will also give intravenous hydration.  Diet as tolerated.  2.  Hypertension.  3.  Hypothyroidism.  4.  Gastroesophageal reflux disease.  Corinna L. Lendell Caprice, MD  Electronically Signed     CLS/MEDQ  D:  04/22/2005  T:  04/22/2005  Job:  [id number]   cc:   Dr. Loralee Pacas, [place]. Pract.

## 2010-06-20 NOTE — Op Note (Signed)
Heather Smith, [place]              ACCOUNT NO.:  [id number]   MEDICAL RECORD NO.:  [id number]          PATIENT TYPE:  AMB   LOCATION:  ENDO                         FACILITY:  [facility]   PHYSICIAN:  James L. Malon Kindle., M.D.DATE OF BIRTH:  1932/09/30   DATE OF PROCEDURE:  03/03/2004  DATE OF DISCHARGE:                                 OPERATIVE REPORT   PROCEDURE:  Colonoscopy.   MEDICATIONS:  Fentanyl 100 mcg, Versed 8.5 mg IV.   SCOPE:  Olympus pediatric adjustable colonoscope.   INDICATIONS:  History of previous polyps and a strong family history of  colon cancer.   DESCRIPTION OF PROCEDURE:  The procedure has been explained to the patient  and consent obtained.  With the patient in the left lateral decubitus  position, the Olympus scope was inserted and advanced.  The prep was  excellent.  I was able to reach the cecum without difficulty.  The ileocecal  valve and appendiceal orifice seen.  The scope withdrawn in the cecum.  The  ascending colon, transverse colon, descending and sigmoid colon were seen  well.  No significant diverticular disease.  No polyps seen.  The scope was  withdrawn.  The patient tolerated the procedure well.   ASSESSMENT:  Previous history of colon polyps, a strong family history of  colon cancer with negative colonoscopy at this time.  V12.72, V16.0.   PLAN:  Will recommend yearly Hemoccults and repeat colonoscopy in five  years.      JLE/MEDQ  D:  03/03/2004  T:  03/03/2004  Job:  [id number]   cc:   Holley Bouche, M.D.  [address]  [place], [place] [address]  Fax: [phone]

## 2010-06-20 NOTE — Consult Note (Signed)
[name], Smith NO.:  [id number]   MEDICAL RECORD NO.:  [id number]          PATIENT TYPE:  INP   LOCATION:  1428                         FACILITY:  [facility]   PHYSICIAN:  Meade Maw, M.D.    DATE OF BIRTH:  06/14/1932   DATE OF CONSULTATION:  DATE OF DISCHARGE:                                   CONSULTATION   INDICATION FOR CONSULT:  Abnormal EKG, abnormal rhythm.   HISTORY:  Heather Smith is a very pleasant, 77 year old female, who  presented on April 22, 2005 with sudden onset of dizziness, nausea, and  vomiting, initially felt to be related to inner ear.  Neurology consult was  obtained.  They felt that the most likely etiology was small vessel disease.  The patient has a history of hypertension for several years.  She  subsequently underwent MRA of her head.  There was no evidence of occlusion,  stenosis, dissection.  She was noted to have aneurysms of 5 mm or less.  MRI  of her brain revealed mild atrophy, scattered lacunars in some perivascular  spaces and chronic microvascular ischemic changes throughout the white  matter, related to hypertension.  The patient was noted to have a left  bundle-branch block and abnormal heart rhythm.  Cardiology consult was  subsequently obtained.  The patient has had no chest pain. 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  She was not involved in a  regular exercise program.  Her most strenuous activity was walking.  She had  no chest pain with this.  She states that her blood pressure has been well  controlled until this hospitalization.  The patient notes that she has had  increased stress at home.  Her husband had suffered a myocardial infarction  on April 12, 2005.  Her coronary risk factors are significant for  hypertension and dyslipidemia.   PAST MEDICAL HISTORY:  Significant for hypothyroidism, arthritis, GE reflux,  hypertension, dyslipidemia.   MEDICATIONS AT HOME PRIOR TO THIS  ADMISSION:  Altace, Synthroid, Mobic, and  aspirin.   CURRENT MEDICATIONS:  1.  Lovenox.  2.  Altace 10 mg daily.  3.  Synthroid 50 mg daily.  4.  Protonix 40 mg daily.  5.  Meclizine 50 mg p.o. q.8.  6.  Aggrenox has been discontinued.  7.  Tylenol p.r.n.  8.  Zocor 20 mg daily.  9.  Valium p.r.n.  10. Labetalol 5 mg IV q.4 p.r.n.  11. Toradol p.r.n.  12. Zofran p.r.n.   ALLERGIES:  BENADRYL.   SOCIAL HISTORY:  The patient is married.  She lives with her husband.  She  works in a [employer]  No history of tobacco, alcohol, or illicit  drug use.   FAMILY HISTORY:  Significant for ischemic heart disease and questionably  stroke.   REVIEW OF SYSTEMS:  She has had no chest pain, no fever, no cough, no  shortness of breath.  Activity was not limited prior to this presentation.   PHYSICAL EXAMINATION:  VITAL SIGNS:  Blood pressure has been ranging from  139-150.  She has  had isolated events of blood pressure of 190.  Diastolic  pressure is ranging 08-65.  Heart rate is ranging 57-68 beats per minute.  The patient is noted to have sinus rhythm with blocked PACs on her telemetry  accounting for the low heart rate.  HEENT:  Unremarkable.  She has good carotid upstrokes.  There are no carotid  bruits noted.  There is no neck vein distention noted.  PULMONARY EXAM:  Breath sounds which are equal and clear to auscultation.  There is no use of accessory muscles noted.  CARDIOVASCULAR EXAM:  A normal  S1.  There is a slight widened split S2.  There is a quiet systolic murmur noted.  Her PMI is not displaced.  ABDOMEN:  Soft, benign, nontender.  There are no unusual bruits or  pulsations noted.  EXTREMITIES:  No clubbing or edema.  SKIN:  Warm and dry.   LABORATORY DATA:  White count 5.5, hematocrit 36, platelet count 189,000.  Sodium 141, potassium 3.8, chloride 108, CO2 26, BUN 16, creatinine 0.8.  LDL 132, HDL 40.  Homocysteine 10.7.  ECG:  Left bundle-branch block,   otherwise normal  ECG.  Her ECG from August of 2004 was also reviewed.  At  this time, the patient had intraventricular conduction delay consistent with  left bundle-branch block.   IMPRESSION:  1.  Abnormal electrocardiograms.  This has been noted previously in August      of 2004, most likely related to her hypertension.  The patient is also      noted to have blocked PACs on her telemetry.  Serial cardiac enzymes      have not been performed.  Her transthoracic echocardiogram is pending.      At this time, would continue with her Altace at 10 mg daily.  Her blood      pressure appears to be adequately controlled with isolated spikes.  I      would not use labetalol at this time secondary to her bradycardia and      her PACs.  Should the patient require additional antihypertensive, I      would consider addition of Vasotec and the addition of      hydrochlorothiazide or a diuretic.  2.  Dyslipidemia.  The patient currently is on  Zocor.  Her LDL is noted to      be 132.  At this time, I would be most likely be more aggressive with      her LDL and increase her to Zocor 40 for an optimal goal of LDL of 100.  3.  Headaches.  I agree that are headaches are most likely related to her      Aggrenox.   Further recommendations pending the outcome of her echocardiogram and her  adenosine Cardiolite.      Meade Maw, M.D.  Electronically Signed     HP/MEDQ  D:  04/28/2005  T:  04/29/2005  Job:  [id number]   cc:   Molly Maduro A. Nicholos Johns, M.D.  Fax: [phone]

## 2010-06-20 NOTE — H&P (Signed)
[place]  Patient:    Heather Smith                      MRN: [id number] Adm. Date:  [id number] Attending:  Koren Bound CC:         Arvella Merles, M.D.             Alvia Grove., M.D.                         History and Physical  CHIEF COMPLAINT:  Chest pain.  HISTORY OF PRESENT ILLNESS:  This is a 77 year old Caucasian female admitted with chest pain.  She awoke at 0430 hours with severe precordial chest pain.  She had tingling in the left arm and was dyspneic.  She was frightened.  Her husband drove her to the emergency room. On the way in, she became worse and became nauseated, but no vomiting, and had profuse diaphoresis.  The husband was speeding, and a policeman pulled them over, and they transferred her to an ambulance to bring her the rest of the way to the hospital.  She has a past history of chest pain, but never anything this severe, and on March 26, 1998, had an adenosine Cardiolite by Dr. Vesta Mixer, Montez Hageman., which was negative for ischemia, and had an ejection fraction of 54%.  The patient does have risk factors for coronary artery disease, including a positive family history, and a history of elevated cholesterol.  She is not diabetic, and is not a smoker.  FAMILY HISTORY:  Reveals that her mother died of a myocardial infarction. Father died of a ruptured aneurysm.  Her brother had a ruptured aneurysm, but survived  surgery.  The family history is also positive for hypertension.  SOCIAL HISTORY:  She retired from [employer] and now works part-time at [employer]. he does not have any tobacco or alcohol usage.  ALLERGIES:  No known drug allergies.  CURRENT MEDICATIONS: 1. Estratest. 2. Synthroid. 3. Vioxx.  PAST MEDICAL HISTORY:  Positive for a hysterectomy and for knee surgery.  REVIEW OF SYSTEMS:  Gastrointestinal:  No known disease.  She had a negative upper GI one year ago.   Genitourinary:  Negative.  Respiratory:  Negative. Cardiovascular:  She had an ultrasound to rule out an abdominal aortic aneurysm  last month, and it was negative.  PHYSICAL EXAMINATION:  VITAL SIGNS:  Blood pressure 132/60, pulse 68 and regular, respirations normal.  SKIN:  Color is pale.  Skin is warm and dry.  HEENT/NECK:  Unremarkable.  The jugular venous pressure is normal.  Carotids are normal.  CHEST:  Clear.  HEART:  A normal first and second sound.  There is no murmur, gallop, rub, or click.  ABDOMEN:  Soft, without hepatosplenomegaly or masses.  EXTREMITIES:  No phlebitis or edema.  Pedal pulses are good.  Her electrocardiogram shows poor R-wave progression in V1 through V3, but no acute ST-T wave changes.  Chest x-ray is pending.  LABORATORY DATA:  Her initial CPK is 2.4, troponin is less than 0.03. Electrolytes are normal, with potassium of 3.6, BUN 20, creatinine 0.8, blood sugar 115.  DIAGNOSTIC IMPRESSION: 1. Chest pain, rule out myocardial infarction. 2. Compensated hypothyroidism. 3. Status post hysterectomy.  DISPOSITION:  Admit to telemetry.  Will start her on IV nitroglycerin, IV heparin. Serial enzymes and electrocardiograms will be obtained.   Will add a beta  blocker and continue with aspirin, and consider a cardiac catheterization in the a.m. by Dr. Elease Hashimoto.DD:  01/05/99 TD:  01/05/99 Job: [id number]

## 2010-08-18 ENCOUNTER — Encounter: Payer: Self-pay | Admitting: *Deleted

## 2010-08-22 ENCOUNTER — Encounter: Payer: Self-pay | Admitting: Cardiovascular Disease

## 2010-08-22 ENCOUNTER — Ambulatory Visit (INDEPENDENT_AMBULATORY_CARE_PROVIDER_SITE_OTHER): Payer: Medicare Other | Admitting: Cardiovascular Disease

## 2010-08-22 VITALS — BP 150/90 | HR 64 | Ht 67.5 in | Wt 185.6 lb

## 2010-08-22 DIAGNOSIS — E785 Hyperlipidemia, unspecified: Secondary | ICD-10-CM

## 2010-08-22 DIAGNOSIS — I10 Essential (primary) hypertension: Secondary | ICD-10-CM

## 2010-08-22 MED ORDER — HYDROCHLOROTHIAZIDE 25 MG PO TABS: 25.0000 mg | ORAL_TABLET | Freq: Every day | ORAL | Status: DC

## 2010-08-22 MED ORDER — POTASSIUM CHLORIDE ER 10 MEQ PO TBCR: 10.0000 meq | EXTENDED_RELEASE_TABLET | ORAL | Status: DC

## 2010-08-22 NOTE — Assessment & Plan Note (Signed)
We'll check a fasting lipid profile, basic metabolic profile, and hepatic profile today.

## 2010-08-22 NOTE — Progress Notes (Signed)
Heather Smith Date of Birth  1932-02-12 [facility] [address]     [address] [place], [place]  [address] [phone]  Fax  [phone]  History of Present Illness:  Heather Smith is 77 year old female with a history of hypertension, hypercholesterolemia, left bundle branch block, and hypothyroidism. She has done for a while since I last saw her a year ago. She has not had any episodes of chest pain or shortness of breath.  She does complain of some generalized fatigue. She's been under lots of stress due to family issues.  She has not been able to exercise much primarily due to knee pain.   Current Outpatient Prescriptions on File Prior to Visit  Medication Sig Dispense Refill  . Ascorbic Acid (VITAMIN C) 1000 MG tablet Take 1,000 mg by mouth daily.        . cholecalciferol (VITAMIN D) 1000 UNITS tablet Take 1,000 Units by mouth daily.        Marland Kitchen levothyroxine (SYNTHROID, LEVOTHROID) 50 MCG tablet Take 50 mcg by mouth daily.        Marland Kitchen lisinopril (PRINIVIL,ZESTRIL) 10 MG tablet Take 10 mg by mouth daily.        . Magnesium 250 MG TABS Take 250 mg by mouth daily.        . meloxicam (MOBIC) 15 MG tablet Take 15 mg by mouth daily.        . multivitamin (THERAGRAN) per tablet Take 1 tablet by mouth daily.        . nitroGLYCERIN (NITROSTAT) 0.4 MG SL tablet Place 0.4 mg under the tongue every 5 (five) minutes as needed.        Marland Kitchen omeprazole (PRILOSEC) 20 MG capsule Take 20 mg by mouth daily.          Allergies  Allergen Reactions  . Benadryl (Diphenhydramine Hcl)   . Crestor (Rosuvastatin Calcium)   . Lipitor (Atorvastatin Calcium)   . Pravastatin   . Simvastatin   . Niaspan (Niacin (Antihyperlipidemic))     High doses, causes hot flashes & aching     Past Medical History  Diagnosis Date  . Hypertension   . Hypercholesterolemia   . Left bundle branch block   . Hypothyroidism   . Osteoarthritis   . Spinal stenosis   . GERD (gastroesophageal  reflux disease)   . Vitamin D deficiency   . Neurogenic claudication due to lumbar spinal stenosis     Past Surgical History  Procedure Date  . Tubal ligation   . Foot arthrodesis, modified mcbride   . Knee arthroscopy     left  . Knee arthroscopy     right  . Cardiac catheterization 2000  . Ptca   . Lumbar laminectomy 11/03/2006     Bilateral 3, 4, 5 laminectomy, partial L2, decompression of the thecal sac, foraminotomy, posterolateral arthrodesis L3-S1 with autograft   -- SURGEON:  Hilda Lias, M.D.     History  Smoking status  . Never Smoker   Smokeless tobacco  . Not on file    History  Alcohol Use No    Family History  Problem Relation Age of Onset  . Heart attack Mother   . Thyroid disease Mother   . Lung cancer Mother   . Hyperlipidemia Mother   . Aneurysm Father   . Aneurysm Brother     has had an Aneurysm x3  . Colon cancer Brother   . Bone cancer Brother   . Lung  cancer Brother   . Stomach cancer Brother     Reviw of Systems:  Reviewed in the HPI.  All other systems are negative.  Physical Exam: BP 150/90  Pulse 64  Ht 5' 7.5" (1.715 m)  Wt 185 lb 9.6 oz (84.188 kg)  BMI 28.64 kg/m2 The patient is alert and oriented x 3.  The mood and affect are normal.   Skin: warm and dry.  Color is normal.    HEENT:   the sclera are nonicteric.  The mucous membranes are moist.  The carotids are 2+ without bruits.  There is no thyromegaly.  There is no JVD.    Lungs: clear.  The chest wall is non tender.    Heart: regular rate with a normal S1 and S2.  There are no murmurs, gallops, or rubs. The PMI is not displaced.     Abdomin: good bowel sounds.  There is no guarding or rebound.  There is no hepatosplenomegaly or tenderness.  There are no masses.   Extremities:  no clubbing, cyanosis, or edema.  The legs are without rashes.  The distal pulses are intact.   Neuro:  Cranial nerves II - XII are intact.  Motor and sensory functions are intact.    The  gait is normal.  Assessment / Plan:

## 2010-08-22 NOTE — Assessment & Plan Note (Signed)
Her blood pressure is moderately elevated today. For some reason she has discontinued her HCTZ and potassium. We have prescribed her 25 mg of HCTZ a day as well as K. Dur 10 mEq a day. We'll see her back in 12 months for followup office visit. We'll recheck laboratory bedtime.

## 2010-08-27 ENCOUNTER — Other Ambulatory Visit (INDEPENDENT_AMBULATORY_CARE_PROVIDER_SITE_OTHER): Payer: Medicare Other | Admitting: *Deleted

## 2010-08-27 LAB — BASIC METABOLIC PANEL
BUN: 34 mg/dL — ABNORMAL HIGH (ref 6–23)
CO2: 28 mEq/L (ref 19–32)
Calcium: 9.6 mg/dL (ref 8.4–10.5)
Chloride: 107 mEq/L (ref 96–112)
Creatinine, Ser: 1.2 mg/dL (ref 0.4–1.2)

## 2010-08-27 LAB — LIPID PANEL
Cholesterol: 175 mg/dL (ref 0–200)
LDL Cholesterol: 95 mg/dL (ref 0–99)
Total CHOL/HDL Ratio: 3

## 2010-08-27 LAB — HEPATIC FUNCTION PANEL
AST: 21 U/L (ref 0–37)
Alkaline Phosphatase: 69 U/L (ref 39–117)
Bilirubin, Direct: 0.1 mg/dL (ref 0.0–0.3)

## 2010-09-12 ENCOUNTER — Telehealth: Payer: Self-pay | Admitting: Cardiovascular Disease

## 2010-09-12 ENCOUNTER — Encounter: Payer: Self-pay | Admitting: Cardiology

## 2010-09-12 NOTE — Telephone Encounter (Signed)
Labs never reviewed/commented on.  Please advise

## 2010-09-12 NOTE — Telephone Encounter (Signed)
Pt wants to know the results of labs she had done on 0725 please call

## 2010-09-12 NOTE — Telephone Encounter (Signed)
Pt informed Dr Elease Hashimoto out of the office this week. Lab results to be reviewed by Dr. Elease Hashimoto then we will advise. Pt has no further questions.

## 2010-09-12 NOTE — Telephone Encounter (Signed)
Routed to wrong nurse, please fwd to amy sweeney

## 2010-09-15 ENCOUNTER — Other Ambulatory Visit: Payer: Self-pay | Admitting: Family Medicine

## 2010-09-15 DIAGNOSIS — Z1231 Encounter for screening mammogram for malignant neoplasm of breast: Secondary | ICD-10-CM

## 2010-09-16 NOTE — Progress Notes (Signed)
Patient called with lab results. Pt verbalized understanding. Pt decline setting up lab app currently, reminded next due 03/2011, Alfonso Ramus RN

## 2010-10-02 ENCOUNTER — Ambulatory Visit: Payer: Medicare Other

## 2010-10-10 ENCOUNTER — Ambulatory Visit
Admission: RE | Admit: 2010-10-10 | Discharge: 2010-10-10 | Disposition: A | Discharge status: Home / Self Care | Payer: Medicare Other | Source: Ambulatory Visit | Attending: Family Medicine | Admitting: Family Medicine

## 2010-10-10 DIAGNOSIS — Z1231 Encounter for screening mammogram for malignant neoplasm of breast: Secondary | ICD-10-CM

## 2010-10-13 ENCOUNTER — Other Ambulatory Visit: Payer: Self-pay | Admitting: Cardiology

## 2010-10-13 ENCOUNTER — Telehealth: Payer: Self-pay | Admitting: Cardiology

## 2010-10-13 MED ORDER — ROSUVASTATIN CALCIUM 5 MG PO TABS: ORAL_TABLET | ORAL | Status: DC

## 2010-10-13 NOTE — Telephone Encounter (Signed)
Med refill as advised per Dr Elease Hashimoto.

## 2010-10-13 NOTE — Telephone Encounter (Signed)
Spoke to the pt regarding request from pharmacy for Crestor. She is currently taking 1/2 of a 5 mg tablet. Crestor is listed as an allergy but she reports she does not tolerate high doses. She gets muscle aches. Informed the patient that I did not see it on her medication list that was reviewed with her at her July appt. She reports she is still taking the medication.  Awaiting Dr. Harvie Bridge approval for refill. If approved the pt uses [facility] and would like  30 day supply.

## 2010-10-13 NOTE — Telephone Encounter (Signed)
Go ahead and refill

## 2010-11-13 LAB — CBC
HCT: 39.7
MCHC: 35
MCV: 89.7
Platelets: 250
RDW: 12.8

## 2010-11-13 LAB — BASIC METABOLIC PANEL
BUN: 17
Chloride: 105
Glucose, Bld: 100 — ABNORMAL HIGH
Potassium: 4.7

## 2011-10-02 ENCOUNTER — Other Ambulatory Visit: Payer: Self-pay | Admitting: Family Medicine

## 2011-10-02 DIAGNOSIS — Z1231 Encounter for screening mammogram for malignant neoplasm of breast: Secondary | ICD-10-CM

## 2011-10-29 ENCOUNTER — Ambulatory Visit
Admission: RE | Admit: 2011-10-29 | Discharge: 2011-10-29 | Disposition: A | Discharge status: Home / Self Care | Payer: Medicare HMO | Source: Ambulatory Visit | Attending: Family Medicine | Admitting: Family Medicine

## 2011-10-29 DIAGNOSIS — Z1231 Encounter for screening mammogram for malignant neoplasm of breast: Secondary | ICD-10-CM

## 2012-05-05 ENCOUNTER — Telehealth: Payer: Self-pay | Admitting: Cardiovascular Disease

## 2012-05-05 NOTE — Telephone Encounter (Signed)
New problem   Pt is experiencing SOB for 2 months and want to speak to a nurse concerning this matter.

## 2012-05-05 NOTE — Telephone Encounter (Signed)
C/o being stressed out and no energy for 2 months, SOB with activities, has not taken bp in awhile but 2 months ago her dentist did and she thinks it was 70/50, told her that those numbers are very low and was she sure that was what was said, she said they told her her bp was low but unsure of numbers. Denies LE edema, no abd weight gain. occ CP mid chest when stressed but no other SX. Told her to see her pcp Dr Tiburcio Pea and he will refer her here if she is needed to be seen, she agreed to plan.

## 2012-05-16 ENCOUNTER — Other Ambulatory Visit: Payer: Self-pay | Admitting: Family Medicine

## 2012-05-16 ENCOUNTER — Encounter: Payer: Self-pay | Admitting: Nurse Practitioner

## 2012-05-16 ENCOUNTER — Ambulatory Visit (INDEPENDENT_AMBULATORY_CARE_PROVIDER_SITE_OTHER): Payer: Medicare HMO | Admitting: Nurse Practitioner

## 2012-05-16 ENCOUNTER — Ambulatory Visit
Admission: RE | Admit: 2012-05-16 | Discharge: 2012-05-16 | Disposition: A | Discharge status: Home / Self Care | Payer: Medicare HMO | Source: Ambulatory Visit | Attending: Family Medicine | Admitting: Family Medicine

## 2012-05-16 VITALS — BP 128/78 | HR 69 | Ht 67.0 in | Wt 186.8 lb

## 2012-05-16 DIAGNOSIS — R079 Chest pain, unspecified: Secondary | ICD-10-CM

## 2012-05-16 DIAGNOSIS — R05 Cough: Secondary | ICD-10-CM

## 2012-05-16 DIAGNOSIS — E785 Hyperlipidemia, unspecified: Secondary | ICD-10-CM

## 2012-05-16 DIAGNOSIS — R059 Cough, unspecified: Secondary | ICD-10-CM

## 2012-05-16 DIAGNOSIS — I10 Essential (primary) hypertension: Secondary | ICD-10-CM

## 2012-05-16 MED ORDER — NITROGLYCERIN 0.4 MG SL SUBL: 0.4000 mg | SUBLINGUAL_TABLET | SUBLINGUAL | Status: DC | PRN
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
## 2012-05-16 NOTE — Patient Instructions (Addendum)
NT[facility] RX SENT IN TODAY TO [facility]  Your physician has requested that you have a lexiscan myoview. For further information please visit [email or web address] Please follow instruction sheet, as given.  No changes with medications  PLEASE FOLLOW UP WITH DR. Elease Hashimoto IN 2 MONTHS

## 2012-05-26 ENCOUNTER — Ambulatory Visit (HOSPITAL_COMMUNITY): Payer: Medicare HMO | Attending: Cardiology | Admitting: Radiology

## 2012-05-26 VITALS — Ht 67.0 in | Wt 186.0 lb

## 2012-05-26 DIAGNOSIS — Z8249 Family history of ischemic heart disease and other diseases of the circulatory system: Secondary | ICD-10-CM | POA: Insufficient documentation

## 2012-05-26 DIAGNOSIS — R000 Tachycardia, unspecified: Secondary | ICD-10-CM | POA: Insufficient documentation

## 2012-05-26 DIAGNOSIS — E785 Hyperlipidemia, unspecified: Secondary | ICD-10-CM

## 2012-05-26 DIAGNOSIS — R0989 Other specified symptoms and signs involving the circulatory and respiratory systems: Secondary | ICD-10-CM | POA: Insufficient documentation

## 2012-05-26 DIAGNOSIS — I10 Essential (primary) hypertension: Secondary | ICD-10-CM | POA: Insufficient documentation

## 2012-05-26 DIAGNOSIS — R61 Generalized hyperhidrosis: Secondary | ICD-10-CM | POA: Insufficient documentation

## 2012-05-26 DIAGNOSIS — R079 Chest pain, unspecified: Secondary | ICD-10-CM

## 2012-05-26 DIAGNOSIS — I447 Left bundle-branch block, unspecified: Secondary | ICD-10-CM | POA: Insufficient documentation

## 2012-05-26 DIAGNOSIS — R0602 Shortness of breath: Secondary | ICD-10-CM

## 2012-05-26 DIAGNOSIS — R002 Palpitations: Secondary | ICD-10-CM | POA: Insufficient documentation

## 2012-05-26 DIAGNOSIS — R0609 Other forms of dyspnea: Secondary | ICD-10-CM | POA: Insufficient documentation

## 2012-05-26 DIAGNOSIS — R5381 Other malaise: Secondary | ICD-10-CM | POA: Insufficient documentation

## 2012-05-26 MED ORDER — TECHNETIUM TC 99M SESTAMIBI GENERIC - CARDIOLITE
33.0000 | Freq: Once | INTRAVENOUS | Status: AC | PRN
  Administered 2012-05-26: 33 via INTRAVENOUS

## 2012-05-26 MED ORDER — ADENOSINE (DIAGNOSTIC) 3 MG/ML IV SOLN
0.5600 mg/kg | Freq: Once | INTRAVENOUS | Status: AC
  Administered 2012-05-26: 47.4 mg via INTRAVENOUS

## 2012-05-26 MED ORDER — TECHNETIUM TC 99M SESTAMIBI GENERIC - CARDIOLITE
11.0000 | Freq: Once | INTRAVENOUS | Status: AC | PRN
  Administered 2012-05-26: 11 via INTRAVENOUS

## 2012-05-26 NOTE — Progress Notes (Signed)
[facility] SITE 3 NUCLEAR MED [address] [place], [place] [address] [phone]    Cardiology Nuclear Med Study  Heather Smith is a 79 y.o. female     MRN : [id number]     DOB: 06-04-1932  Procedure Date: 05/26/2012  Nuclear Med Background Indication for Stress Test:  Evaluation for Ischemia History: No prior known history of CAD, 1990's Cath: normal coronaries, '07 Echo: EF= lower limits of normal, and 3-07 Myocardial Perfusion Study-? Ischemia vs attenuation apical segment of anteromedial , EF=54% Cardiac Risk Factors: Family History - CAD, Hypertension, LBBB and Lipids  Symptoms: Chest pain with/without exertion (last occurrence last week), Diaphoresis, DOE, Fatigue, Fatigue with Exertion, Palpitations and Rapid HR   Nuclear Pre-Procedure Caffeine/Decaff Intake:  None > 12 HRS NPO After: 5:30AM   Lungs:  clear O2 Sat: 98% on room air. IV 0.9% NS with Angio Cath:  22g  IV Site: R Antecubital X 1, tolerated well IV Started by:  Irean Hong, RN  Chest Size (in):  40 Cup Size: C  Height: 5\' 7"  (1.702 m)  Weight:  186 lb (84.369 kg)  BMI:  Body mass index is 29.12 kg/(m^2). Tech Comments:  Lisinopril this am    Nuclear Med Study 1 or 2 day study: 1 day  Stress Test Type:  Adenosine  Reading MD: Marca Ancona, MD  Order Authorizing Provider:  Kristeen Miss, MD  Resting Radionuclide: Technetium 21m Sestamibi  Resting Radionuclide Dose: 11.0 mCi   Stress Radionuclide:  Technetium 20m Sestamibi  Stress Radionuclide Dose: 33.0 mCi           Stress Protocol Rest HR: 75 Stress HR: 91  Rest BP: 146/95 Stress BP: 149/91  Exercise Time (min): n/a METS: n/a   Predicted Max HR: 142 bpm % Max HR: 64.08 bpm Rate Pressure Product: 21308   Dose of Adenosine (mg):  47.3 Dose of Lexiscan: n/a mg  Dose of Atropine (mg): n/a Dose of Dobutamine: n/a mcg/kg/min (at max HR)  Stress Test Technologist: Irean Hong, RN  Nuclear Technologist:  Domenic Polite, CNMT      Rest Procedure:  Myocardial perfusion imaging was performed at rest 45 minutes following the intravenous administration of Technetium 92m Sestamibi. Rest ECG: NSR-LBBB  Stress Procedure:  The patient received IV adenosine at 140 mcg/kg/min for 4 minutes.  The patient complained of marked fatigue, and chest pain with adenosine.Technetium 19m Sestamibi was injected at the 2 minute mark and quantitative spect images were obtained after a 45 minute delay. Stress ECG: Frequent PVCs.  No other changes.   QPS Raw Data Images:  Normal; no motion artifact; normal heart/lung ratio. Stress Images:  Small, mild apical septal perfusion defect.  Rest Images:  Small, mild apical septal perfusion defect. Subtraction (SDS):  Fixed, small mild apical septal perfusion defect.  Transient Ischemic Dilatation (Normal <1.22):  1.05 Lung/Heart Ratio (Normal <0.45):  0.37  Quantitative Gated Spect Images QGS EDV:  n/a QGS ESV:  n/a  Impression Exercise Capacity:  Adenosine study with no exercise. BP Response:  Normal blood pressure response. Clinical Symptoms:  chest pain, flushing.  ECG Impression:  Baseline:  LBBB.  EKG uninterpretable due to LBBB at rest and stress.  Frequent PVCs.  Comparison with Prior Nuclear Study: Similar to report of prior nuclear study.   Overall Impression:  Low risk stress nuclear study.  There is a small, fixed mild apical septal perfusion defect.  This may be attenuation due to LBBB.  No ischemia.  LV Ejection Fraction: Study not gated.  LV Wall Motion:  Study not gated  [employer] 05/26/2012

## 2012-05-31 ENCOUNTER — Telehealth: Payer: Self-pay | Admitting: *Deleted

## 2012-05-31 NOTE — Telephone Encounter (Signed)
Advised patient

## 2012-05-31 NOTE — Telephone Encounter (Signed)
Message copied by Burnell Blanks on Tue May 31, 2012  9:05 AM ------      Message from: Vesta Mixer      Created: Fri May 27, 2012  2:38 PM       Low risk myoview. Continue current treatment ------

## 2012-07-26 ENCOUNTER — Encounter: Payer: Self-pay | Admitting: Cardiovascular Disease

## 2012-07-27 ENCOUNTER — Encounter: Payer: Self-pay | Admitting: Cardiovascular Disease

## 2012-07-29 ENCOUNTER — Ambulatory Visit: Payer: Medicare HMO | Admitting: Cardiovascular Disease

## 2012-10-03 ENCOUNTER — Observation Stay (HOSPITAL_COMMUNITY)
Admission: EM | Admit: 2012-10-03 | Discharge: 2012-10-05 | Disposition: A | Discharge status: Home / Self Care | Payer: Medicare HMO | Attending: Cardiology | Admitting: Cardiology

## 2012-10-03 ENCOUNTER — Emergency Department (HOSPITAL_COMMUNITY): Payer: Medicare HMO

## 2012-10-03 ENCOUNTER — Encounter (HOSPITAL_COMMUNITY): Payer: Self-pay | Admitting: Neurology

## 2012-10-03 DIAGNOSIS — R112 Nausea with vomiting, unspecified: Secondary | ICD-10-CM | POA: Insufficient documentation

## 2012-10-03 DIAGNOSIS — R55 Syncope and collapse: Secondary | ICD-10-CM | POA: Insufficient documentation

## 2012-10-03 DIAGNOSIS — M48062 Spinal stenosis, lumbar region with neurogenic claudication: Secondary | ICD-10-CM | POA: Insufficient documentation

## 2012-10-03 DIAGNOSIS — E785 Hyperlipidemia, unspecified: Secondary | ICD-10-CM

## 2012-10-03 DIAGNOSIS — R0989 Other specified symptoms and signs involving the circulatory and respiratory systems: Secondary | ICD-10-CM | POA: Insufficient documentation

## 2012-10-03 DIAGNOSIS — Z79899 Other long term (current) drug therapy: Secondary | ICD-10-CM | POA: Insufficient documentation

## 2012-10-03 DIAGNOSIS — E039 Hypothyroidism, unspecified: Secondary | ICD-10-CM | POA: Insufficient documentation

## 2012-10-03 DIAGNOSIS — I509 Heart failure, unspecified: Secondary | ICD-10-CM | POA: Insufficient documentation

## 2012-10-03 DIAGNOSIS — I10 Essential (primary) hypertension: Secondary | ICD-10-CM | POA: Diagnosis present

## 2012-10-03 DIAGNOSIS — M1990 Unspecified osteoarthritis, unspecified site: Secondary | ICD-10-CM | POA: Insufficient documentation

## 2012-10-03 DIAGNOSIS — I5022 Chronic systolic (congestive) heart failure: Secondary | ICD-10-CM | POA: Insufficient documentation

## 2012-10-03 DIAGNOSIS — R0609 Other forms of dyspnea: Secondary | ICD-10-CM | POA: Insufficient documentation

## 2012-10-03 DIAGNOSIS — R079 Chest pain, unspecified: Secondary | ICD-10-CM

## 2012-10-03 DIAGNOSIS — R072 Precordial pain: Principal | ICD-10-CM | POA: Insufficient documentation

## 2012-10-03 DIAGNOSIS — K219 Gastro-esophageal reflux disease without esophagitis: Secondary | ICD-10-CM | POA: Insufficient documentation

## 2012-10-03 DIAGNOSIS — E7800 Pure hypercholesterolemia, unspecified: Secondary | ICD-10-CM | POA: Insufficient documentation

## 2012-10-03 DIAGNOSIS — I447 Left bundle-branch block, unspecified: Secondary | ICD-10-CM | POA: Insufficient documentation

## 2012-10-03 DIAGNOSIS — I200 Unstable angina: Secondary | ICD-10-CM

## 2012-10-03 DIAGNOSIS — E559 Vitamin D deficiency, unspecified: Secondary | ICD-10-CM | POA: Insufficient documentation

## 2012-10-03 DIAGNOSIS — I428 Other cardiomyopathies: Secondary | ICD-10-CM

## 2012-10-03 LAB — CBC WITH DIFFERENTIAL/PLATELET
Eosinophils Absolute: 0.1 10*3/uL (ref 0.0–0.7)
Hemoglobin: 14 g/dL (ref 12.0–15.0)
Lymphocytes Relative: 29 % (ref 12–46)
Lymphs Abs: 1.6 10*3/uL (ref 0.7–4.0)
MCH: 30.7 pg (ref 26.0–34.0)
Monocytes Relative: 7 % (ref 3–12)
Neutro Abs: 3.6 10*3/uL (ref 1.7–7.7)
Neutrophils Relative %: 64 % (ref 43–77)
RBC: 4.56 MIL/uL (ref 3.87–5.11)
WBC: 5.7 10*3/uL (ref 4.0–10.5)

## 2012-10-03 LAB — TROPONIN I: Troponin I: <0.3 ng/mL (ref <0.30)

## 2012-10-03 LAB — URINALYSIS, ROUTINE W REFLEX MICROSCOPIC
Bilirubin Urine: NEGATIVE
Hgb urine dipstick: NEGATIVE
Ketones, ur: NEGATIVE mg/dL
Nitrite: NEGATIVE
Urobilinogen, UA: 0.2 mg/dL (ref 0.0–1.0)

## 2012-10-03 LAB — URINE MICROSCOPIC-ADD ON

## 2012-10-03 LAB — CBC
HCT: 42.1 % (ref 36.0–46.0)
MCV: 89 fL (ref 78.0–100.0)
Platelets: 172 10*3/uL (ref 150–400)
RBC: 4.73 MIL/uL (ref 3.87–5.11)
WBC: 6.7 10*3/uL (ref 4.0–10.5)

## 2012-10-03 LAB — PROTIME-INR
INR: 0.85 (ref 0.00–1.49)
Prothrombin Time: 11.5 seconds — ABNORMAL LOW (ref 11.6–15.2)

## 2012-10-03 LAB — COMPREHENSIVE METABOLIC PANEL
ALT: 13 U/L (ref 0–35)
Alkaline Phosphatase: 68 U/L (ref 39–117)
BUN: 25 mg/dL — ABNORMAL HIGH (ref 6–23)
CO2: 24 mEq/L (ref 19–32)
Chloride: 102 mEq/L (ref 96–112)
GFR calc Af Amer: 53 mL/min — ABNORMAL LOW (ref >90)
GFR calc non Af Amer: 46 mL/min — ABNORMAL LOW (ref >90)
Glucose, Bld: 139 mg/dL — ABNORMAL HIGH (ref 70–99)
Potassium: 4.5 mEq/L (ref 3.5–5.1)
Total Bilirubin: 0.4 mg/dL (ref 0.3–1.2)

## 2012-10-03 LAB — CREATININE, SERUM
GFR calc Af Amer: 67 mL/min — ABNORMAL LOW (ref >90)
GFR calc non Af Amer: 58 mL/min — ABNORMAL LOW (ref >90)

## 2012-10-03 MED ORDER — VITAMIN D3 25 MCG (1000 UNIT) PO TABS
1000.0000 [IU] | ORAL_TABLET | Freq: Every day | ORAL | Status: DC
  Administered 2012-10-04 – 2012-10-05 (×2): 1000 [IU] via ORAL
  Filled 2012-10-03 (×3): qty 1

## 2012-10-03 MED ORDER — HEPARIN SODIUM (PORCINE) 5000 UNIT/ML IJ SOLN
5000.0000 [IU] | Freq: Three times a day (TID) | INTRAMUSCULAR | Status: DC
  Administered 2012-10-03 – 2012-10-04 (×2): 5000 [IU] via SUBCUTANEOUS
  Filled 2012-10-03 (×5): qty 1

## 2012-10-03 MED ORDER — ASPIRIN EC 81 MG PO TBEC
81.0000 mg | DELAYED_RELEASE_TABLET | Freq: Every day | ORAL | Status: DC
  Administered 2012-10-05: 81 mg via ORAL
  Filled 2012-10-03: qty 1

## 2012-10-03 MED ORDER — SODIUM CHLORIDE 0.9 % IV SOLN
INTRAVENOUS | Status: DC
  Administered 2012-10-03: 23:00:00 via INTRAVENOUS

## 2012-10-03 MED ORDER — ACETAMINOPHEN 325 MG PO TABS
650.0000 mg | ORAL_TABLET | ORAL | Status: DC | PRN
  Administered 2012-10-04: 650 mg via ORAL

## 2012-10-03 MED ORDER — POLYVINYL ALCOHOL 1.4 % OP SOLN
2.0000 [drp] | Freq: Two times a day (BID) | OPHTHALMIC | Status: DC | PRN
  Administered 2012-10-03: 2 [drp] via OPHTHALMIC
  Filled 2012-10-03: qty 15

## 2012-10-03 MED ORDER — LISINOPRIL 10 MG PO TABS
10.0000 mg | ORAL_TABLET | Freq: Every day | ORAL | Status: DC
  Administered 2012-10-04 – 2012-10-05 (×2): 10 mg via ORAL
  Filled 2012-10-03 (×3): qty 1

## 2012-10-03 MED ORDER — POLYETHYL GLYCOL-PROPYL GLYCOL 0.4-0.3 % OP SOLN: 2.0000 [drp] | Freq: Two times a day (BID) | OPHTHALMIC | Status: DC | PRN

## 2012-10-03 MED ORDER — LEVOTHYROXINE SODIUM 50 MCG PO TABS
50.0000 ug | ORAL_TABLET | Freq: Every day | ORAL | Status: DC
  Administered 2012-10-04 – 2012-10-05 (×2): 50 ug via ORAL
  Filled 2012-10-03 (×3): qty 1

## 2012-10-03 MED ORDER — PANTOPRAZOLE SODIUM 40 MG PO TBEC
40.0000 mg | DELAYED_RELEASE_TABLET | Freq: Every day | ORAL | Status: DC
  Administered 2012-10-04 – 2012-10-05 (×2): 40 mg via ORAL
  Filled 2012-10-03 (×2): qty 1

## 2012-10-03 MED ORDER — NITROGLYCERIN 0.4 MG SL SUBL: 0.4000 mg | SUBLINGUAL_TABLET | SUBLINGUAL | Status: DC | PRN

## 2012-10-03 MED ORDER — ASPIRIN 81 MG PO CHEW
324.0000 mg | CHEWABLE_TABLET | ORAL | Status: AC
  Administered 2012-10-04: 324 mg via ORAL
  Filled 2012-10-03: qty 4

## 2012-10-03 MED ORDER — MULTIVITAMINS PO TABS: 1.0000 | ORAL_TABLET | Freq: Every day | ORAL | Status: DC

## 2012-10-03 MED ORDER — ALPRAZOLAM 0.25 MG PO TABS
0.2500 mg | ORAL_TABLET | Freq: Two times a day (BID) | ORAL | Status: DC | PRN
  Administered 2012-10-04: 0.25 mg via ORAL

## 2012-10-03 MED ORDER — ADULT MULTIVITAMIN W/MINERALS CH
1.0000 | ORAL_TABLET | Freq: Every day | ORAL | Status: DC
  Administered 2012-10-04 – 2012-10-05 (×2): 1 via ORAL
  Filled 2012-10-03 (×2): qty 1

## 2012-10-03 MED ORDER — ONDANSETRON HCL 4 MG/2ML IJ SOLN: 4.0000 mg | Freq: Four times a day (QID) | INTRAMUSCULAR | Status: DC | PRN

## 2012-10-03 NOTE — H&P (Signed)
Patient ID: Heather Smith MRN: [id number], DOB/AGE: [birth year]   Admit date: 10/03/2012  Primary Physician: Johny Blamer, MD Primary Cardiologist: Katherina Right, MD  Pt. Profile:  79 y/o female with h/o c/p and nl cath in the 90's and low-risk MV in April, who presented to the ED today following an episode of exertional chest pain associated with dyspnea, nausea, vomiting, and presyncope.  Problem List  Past Medical History  Diagnosis Date  . Hypertension   . Hypercholesterolemia   . Left bundle branch block   . Hypothyroidism   . Osteoarthritis   . Spinal stenosis   . GERD (gastroesophageal reflux disease)   . Vitamin D deficiency   . Neurogenic claudication due to lumbar spinal stenosis   . Chest pain     a. Nl cath in the 90's;  b. 04/2005: Myoview: subtle areao of ? reversibility in apical segment of anteromedial LV - ? attenuation, EF 54%;  c. 05/2012 MV: small, fixed mild apical septal perfusion defect, ? attenuation due to lbbb, no ischemia.    Past Surgical History  Procedure Laterality Date  . Tubal ligation    . Foot arthrodesis, modified mcbride    . Knee arthroscopy      left  . Knee arthroscopy      right  . Cardiac catheterization  2000  . Ptca    . Lumbar laminectomy  11/03/2006     Bilateral 3, 4, 5 laminectomy, partial L2, decompression of the thecal sac, foraminotomy, posterolateral arthrodesis L3-S1 with autograft   -- SURGEON:  Hilda Lias, M.D.     Allergies  Allergies  Allergen Reactions  . Statins Other (See Comments)    "Makes her bones hurt and very sore"  . Crestor [Rosuvastatin Calcium]   . Lipitor [Atorvastatin Calcium]   . Pravastatin   . Simvastatin   . Benadryl [Diphenhydramine Hcl] Other (See Comments)    Very jittery  . Niaspan [Niacin Er]     High doses, causes hot flashes & aching    HPI  79 y/o female with the above problem list.  She has a h/o chest pain dating back to the 1990's.  She underwent cath at that time, which  was reportedly nonobstructive.  In the spring of this year, she began to experience fatigue and also intermittent chest discomfort.  She underwent stress testing, which was felt to be low-risk and she was medically managed.  She did well following stress testing and hasn't been having much in the way of chest pain, though she has been dealing with right shoulder discomfort, which is worse with mvmt of the right arm.  She has also continued to have fatigue.  Today, she was out working in her yard, when she developed chest pressure associated with dyspnea and lightheadedness.  She went inside to alert her husband as the chest pressure worsened and he called EMS.  She felt presyncopal and laid down on the couch.  Her husband called EMS and on the operator's advice, he gave her 2 sl ntg w/o relief of chest pain.  He then gave 325mg  of ASA.  Upon drinking H2O to swallow the ASA, she became acutely nauseated and vomited.  After vomiting, she began feeling better, though continued to feel fatigued.  EMS arrived and she was taken into the [facility] ED.  Here, ECG shows chronic LBBB w/o acute changes.  Initial troponin is normal. BUN/Creat are mildly elevated.  She is currently pain free.  Home Medications  Prior  to Admission medications   Medication Sig Start Date End Date Taking? Authorizing Provider  Ascorbic Acid (VITAMIN C) 1000 MG tablet Take 1,000 mg by mouth daily.      Historical Provider, MD  Biotin (BIOTIN 5000) 5 MG CAPS Take 5,000 mg by mouth daily.    Historical Provider, MD  cholecalciferol (VITAMIN D) 1000 UNITS tablet Take 1,000 Units by mouth daily.      Historical Provider, MD  levothyroxine (SYNTHROID, LEVOTHROID) 50 MCG tablet Take 50 mcg by mouth daily.      Historical Provider, MD  lisinopril (PRINIVIL,ZESTRIL) 10 MG tablet Take 10 mg by mouth daily.      Historical Provider, MD  Magnesium 250 MG TABS Take 250 mg by mouth daily.      Historical Provider, MD  multivitamin [facility]) per tablet  Take 1 tablet by mouth daily.      Historical Provider, MD  nitroGLYCERIN (NITROSTAT) 0.4 MG SL tablet Place 1 tablet (0.4 mg total) under the tongue every 5 (five) minutes as needed. 05/16/12   Ok Anis, NP  omeprazole (PRILOSEC) 20 MG capsule Take 40 mg by mouth daily.     Historical Provider, MD  traMADol (ULTRAM) 50 MG tablet Take 50 mg by mouth as needed. 05/13/12   Historical Provider, MD  vitamin E 400 UNIT capsule Take 400 Units by mouth daily.    Historical Provider, MD   Family History  Family History  Problem Relation Age of Onset  . Heart attack Mother     died @ 54.  . Thyroid disease Mother   . Lung cancer Mother   . Hyperlipidemia Mother   . Aneurysm Father     died @ 13.  . Aneurysm Brother     has had an Aneurysm x3  . Colon cancer Brother   . Bone cancer Brother   . Lung cancer Brother   . Stomach cancer Brother    Social History  History   Social History  . Marital Status: Married    Spouse Name: N/A    Number of Children: N/A  . Years of Education: N/A   Occupational History  . Not on file.   Social History Main Topics  . Smoking status: Never Smoker   . Smokeless tobacco: Not on file  . Alcohol Use: No  . Drug Use: No  . Sexual Activity: Not on file   Other Topics Concern  . Not on file   Social History Narrative   Lives in [place], [place] with husband.  Active around the house.    Review of Systems General:  No chills, fever, night sweats or weight changes.  Cardiovascular:  +++ chest pain, dyspnea, lightheadedness/presyncope.  No edema, orthopnea, palpitations, paroxysmal nocturnal dyspnea. Dermatological: No rash, lesions/masses Respiratory: No cough, +++ dyspnea Urologic: No hematuria, dysuria Abdominal:   No nausea, vomiting, diarrhea, bright red blood per rectum, melena, or hematemesis Neurologic:  No visual changes, wkns, changes in mental status. All other systems reviewed and are otherwise negative except as noted  above.  Physical Exam  Blood pressure 127/72, pulse 73, temperature 98.4 F (36.9 C), temperature source Oral, resp. rate 15, SpO2 95.00%.  General: Pleasant, NAD Psych: Normal affect. Neuro: Alert and oriented X 3. Moves all extremities spontaneously. HEENT: Normal  Neck: Supple without bruits or JVD. Lungs:  Resp regular and unlabored, CTA. Heart: RRR no s3, s4, or murmurs. Abdomen: Soft, non-tender, non-distended, BS + x 4.  Extremities: No clubbing, cyanosis or edema. DP/PT/Radials  2+ and equal bilaterally.  Labs  Recent Labs  10/03/12 1612  TROPONINI <0.30   Lab Results  Component Value Date   WBC 5.7 10/03/2012   HGB 14.0 10/03/2012   HCT 40.8 10/03/2012   MCV 89.5 10/03/2012   PLT 164 10/03/2012     Recent Labs Lab 10/03/12 1611  NA 138  K 4.5  CL 102  CO2 24  BUN 25*  CREATININE 1.12*  CALCIUM 9.5  PROT 7.2  BILITOT 0.4  ALKPHOS 68  ALT 13  AST 23  GLUCOSE 139*   Lab Results  Component Value Date   CHOL 175 08/22/2010   HDL 51.50 08/22/2010   LDLCALC 95 08/22/2010   TRIG 142.0 08/22/2010   Radiology/Studies  Dg Chest Portable 1 View  10/03/2012   CLINICAL DATA:  Chest pain.  EXAM: PORTABLE CHEST - 1 VIEW  COMPARISON:  05/16/2012  FINDINGS: Heart is borderline in size. Minimal left base atelectasis. Right lung is clear. No effusions or acute bony abnormality.  IMPRESSION: Minimal left base atelectasis.   Electronically Signed   By: Charlett Nose   On: 10/03/2012 16:49   ECG  Rsr, 73, lbbb, no acute st/t changes.  ASSESSMENT AND PLAN  1.  [place]:  Pt presented following an episode of exertional chest pain associated with dyspnea, diaphoresis, nausea, and vomiting.  Initial troponin is normal.  ECG with chronic LBBB.  She is currently pain free.  We will plan to admit and cycle CE.  Add ASA.  Ss are concerning for angina.  She had a low-risk MV in April.  Will plan on diagnostic catheterization in the AM to define coronary anatomy.  2.  HTN:  Stable.    3.   Hypothyroidism:  Cont synthroid.  4.  Mild Renal Insuff:  Likely 2/2 dehydration in setting of working outside today.  Hydrate.  Signed, Nicolasa Ducking, NP 10/03/2012, 6:48 PM   History and all data above reviewed.  Patient examined.  I agree with the findings as above. She presents with a severe episode of chest pressure "like somebody sitting on me."  This is unlike her previous presentation earlier this year when she had a low risk perfusion study.  She has also had profound weakness for several months and decreased activity tolerance The patient exam reveals COR:RRR  ,  Lungs: Clear  ,  Abd: Positive bowel sounds, no rebound no guarding, Ext No edema  .  All available labs, radiology testing, previous records reviewed. Agree with documented assessment and plan. Given the recurrent presentation for chest pain and the moderate pretest probability for obstructive CAD with unstable angina cardiac cath is indicated.  The patient understands that risks included but are not limited to stroke (1 in 1000), death (1 in 1000), kidney failure [usually temporary] (1 in 500), bleeding (1 in 200), allergic reaction [possibly serious] (1 in 200).  The patient understands and agrees to proceed.   Fayrene Fearing Amina Menchaca  7:22 PM  10/03/2012

## 2012-10-03 NOTE — ED Notes (Signed)
Pt provided apple juice per cardiology who reports it is okay for her to eat and drink. Cardiology remains at bedside.

## 2012-10-03 NOTE — ED Notes (Signed)
Per EMS- Pt was outside painting, had sudden onset across her chest 10/10. Given 324 aspirin, took 2 nitro. Did have n/v/diaphoresis. When EMS arrived she pain painfree. 18 to left wrist saline lock. EKG showing LBBB with hx. HR 70's with frequent PVCs. BP 140/80. Pt is a x 4

## 2012-10-03 NOTE — ED Provider Notes (Signed)
CSN: [id number]     Arrival date & time 10/03/12  1549 History   First MD Initiated Contact with Patient 10/03/12 1559     Chief Complaint  Patient presents with  . Chest Pain   (Consider location/radiation/quality/duration/timing/severity/associated sxs/prior Treatment) HPI Comments: Patient presents with substernal chest discomfort onset while she was outside painting. This is substernal lasting 25 minutes and improved with nitroglycerin x2. It is associated with nausea diaphoresis and shortness of breath. She is now pain-free. Reports no cardiac history. Negative stress test this past April. She has not had this pain in the past. She denies any abdominal pain or back pain. She did have 2 episodes of vomiting with the pain today. She denies any cough or fever.  The history is provided by the patient.    Past Medical History  Diagnosis Date  . Hypertension   . Hypercholesterolemia   . Left bundle branch block   . Hypothyroidism   . Osteoarthritis   . Spinal stenosis   . GERD (gastroesophageal reflux disease)   . Vitamin D deficiency   . Neurogenic claudication due to lumbar spinal stenosis   . Chest pain     a. Nl cath in the 90's;  b. 04/2005: Myoview: subtle areao of ? reversibility in apical segment of anteromedial LV - ? attenuation, EF 54%;  c. 05/2012 MV: small, fixed mild apical septal perfusion defect, ? attenuation due to lbbb, no ischemia.   Past Surgical History  Procedure Laterality Date  . Tubal ligation    . Foot arthrodesis, modified mcbride    . Knee arthroscopy      left  . Knee arthroscopy      right  . Cardiac catheterization  2000  . Ptca    . Lumbar laminectomy  11/03/2006     Bilateral 3, 4, 5 laminectomy, partial L2, decompression of the thecal sac, foraminotomy, posterolateral arthrodesis L3-S1 with autograft   -- SURGEON:  Hilda Lias, M.D.    Family History  Problem Relation Age of Onset  . Heart attack Mother     died @ 61.  . Thyroid disease  Mother   . Lung cancer Mother   . Hyperlipidemia Mother   . Aneurysm Father     died @ 32.  . Aneurysm Brother     has had an Aneurysm x3  . Colon cancer Brother   . Bone cancer Brother   . Lung cancer Brother   . Stomach cancer Brother    History  Substance Use Topics  . Smoking status: Never Smoker   . Smokeless tobacco: Not on file  . Alcohol Use: No   OB History   Grav Para Term Preterm Abortions TAB SAB Ect Mult Living                 Review of Systems  Constitutional: Positive for activity change and appetite change. Negative for fever and fatigue.  HENT: Negative for congestion and rhinorrhea.   Respiratory: Positive for chest tightness and shortness of breath. Negative for cough.   Cardiovascular: Positive for chest pain.  Gastrointestinal: Positive for nausea and vomiting. Negative for abdominal pain.  Genitourinary: Negative for dysuria, hematuria, vaginal bleeding and vaginal discharge.  Musculoskeletal: Negative for myalgias, back pain and arthralgias.  Skin: Negative for rash.  Neurological: Positive for dizziness and light-headedness. Negative for headaches.  A complete 10 system review of systems was obtained and all systems are negative except as noted in the HPI and PMH.  Allergies  Niaspan; Statins; and Benadryl  Home Medications   Current Outpatient Rx  Name  Route  Sig  Dispense  Refill  . Ascorbic Acid (VITAMIN C) 1000 MG tablet   Oral   Take 1,000 mg by mouth at bedtime.          . Biotin (BIOTIN 5000) 5 MG CAPS   Oral   Take 5,000 mg by mouth daily.         . cholecalciferol (VITAMIN D) 1000 UNITS tablet   Oral   Take 1,000 Units by mouth daily.           Marland Kitchen Dextromethorphan-Guaifenesin (ROBITUSSIN COUGH+CHEST CONG DM) 10-200 MG CAPS   Oral   Take 1 capsule by mouth daily as needed (for cold).         Marland Kitchen levothyroxine (SYNTHROID, LEVOTHROID) 50 MCG tablet   Oral   Take 50 mcg by mouth daily.           Marland Kitchen lisinopril  (PRINIVIL,ZESTRIL) 10 MG tablet   Oral   Take 10 mg by mouth daily.           . Magnesium 250 MG TABS   Oral   Take 250 mg by mouth daily.          . meloxicam (MOBIC) 15 MG tablet   Oral   Take 15 mg by mouth daily.         . multivitamin (THERAGRAN) per tablet   Oral   Take 1 tablet by mouth daily.           . nitroGLYCERIN (NITROSTAT) 0.4 MG SL tablet   Sublingual   Place 1 tablet (0.4 mg total) under the tongue every 5 (five) minutes as needed.   25 tablet   3   . omeprazole (PRILOSEC) 20 MG capsule   Oral   Take 40 mg by mouth daily.          Bertram Gala Glycol-Propyl Glycol (SYSTANE) 0.4-0.3 % SOLN   Both Eyes   Place 2 drops into both eyes 2 (two) times daily as needed (for dry eyes).         . vitamin E 400 UNIT capsule   Oral   Take 400 Units by mouth daily.          BP 133/108  Pulse 50  Temp(Src) 98.4 F (36.9 C) (Oral)  Resp 17  SpO2 99% Physical Exam  Constitutional: She is oriented to person, place, and time. She appears well-developed and well-nourished. No distress.  HENT:  Head: Normocephalic and atraumatic.  Mouth/Throat: Oropharynx is clear and moist. No oropharyngeal exudate.  Eyes: Conjunctivae and EOM are normal. Pupils are equal, round, and reactive to light.  Neck: Normal range of motion. Neck supple.  Cardiovascular: Normal rate, regular rhythm and normal heart sounds.   No murmur heard. +2 DP, PT, radial pulses  Pulmonary/Chest: Effort normal and breath sounds normal. No respiratory distress.  Abdominal: Soft. There is no tenderness. There is no rebound and no guarding.  Musculoskeletal: Normal range of motion. She exhibits no edema and no tenderness.  Neurological: She is alert and oriented to person, place, and time. No cranial nerve deficit. She exhibits normal muscle tone. Coordination normal.  CN 2-12 intact, no ataxia on finger to nose, no nystagmus, 5/5 strength throughout, no pronator drift, Romberg negative,  normal gait.   Skin: Skin is warm.    ED Course  Procedures (including critical care time) Labs Review Labs Reviewed  COMPREHENSIVE METABOLIC PANEL - Abnormal; Notable for the following:    Glucose, Bld 139 (*)    BUN 25 (*)    Creatinine, Ser 1.12 (*)    GFR calc non Af Amer 46 (*)    GFR calc Af Amer 53 (*)    All other components within normal limits  URINALYSIS, ROUTINE W REFLEX MICROSCOPIC - Abnormal; Notable for the following:    APPearance HAZY (*)    Leukocytes, UA MODERATE (*)    All other components within normal limits  URINE MICROSCOPIC-ADD ON - Abnormal; Notable for the following:    Squamous Epithelial / LPF FEW (*)    Bacteria, UA FEW (*)    Casts HYALINE CASTS (*)    All other components within normal limits  URINE CULTURE  CBC WITH DIFFERENTIAL  TROPONIN I   Imaging Review Dg Chest Portable 1 View  10/03/2012   CLINICAL DATA:  Chest pain.  EXAM: PORTABLE CHEST - 1 VIEW  COMPARISON:  05/16/2012  FINDINGS: Heart is borderline in size. Minimal left base atelectasis. Right lung is clear. No effusions or acute bony abnormality.  IMPRESSION: Minimal left base atelectasis.   Electronically Signed   By: Charlett Nose   On: 10/03/2012 16:49    MDM   1. Chest pain   2. Chest pain on exertion    Episode of chest pain with nausea vomiting diaphoresis now resolved. EKG shows unchanged left bundle branch block.  Initial troponin negative. Patient chest pain-free after nitroglycerin. Her symptoms are concerning for angina. This was discussed with cardiology who has evaluated the patient and will admit her for catheterization tomorrow.   Date: 10/03/2012  Rate: 73  Rhythm: normal sinus rhythm  QRS Axis: normal  Intervals: normal  ST/T Wave abnormalities: nonspecific ST/T changes  Conduction Disutrbances:left bundle branch block  Narrative Interpretation:   Old EKG Reviewed: unchanged    Glynn Octave, MD 10/03/12 1934

## 2012-10-03 NOTE — ED Notes (Signed)
Pt undressed, in gown, on monitor, continuous pulse oximetry and blood pressure cuff; vitals and EKG performed 

## 2012-10-04 ENCOUNTER — Encounter (HOSPITAL_COMMUNITY)
Admission: EM | Disposition: A | Discharge status: Home / Self Care | Payer: Commercial Managed Care - HMO | Source: Home / Self Care | Attending: Emergency Medicine

## 2012-10-04 DIAGNOSIS — R079 Chest pain, unspecified: Secondary | ICD-10-CM

## 2012-10-04 HISTORY — PX: LEFT HEART CATHETERIZATION WITH CORONARY ANGIOGRAM: SHX5451

## 2012-10-04 LAB — CBC
HCT: 38.5 % (ref 36.0–46.0)
Hemoglobin: 13.7 g/dL (ref 12.0–15.0)
MCV: 87.7 fL (ref 78.0–100.0)
WBC: 4.4 10*3/uL (ref 4.0–10.5)

## 2012-10-04 LAB — CREATININE, SERUM
GFR calc Af Amer: 74 mL/min — ABNORMAL LOW (ref >90)
GFR calc non Af Amer: 64 mL/min — ABNORMAL LOW (ref >90)

## 2012-10-04 LAB — BASIC METABOLIC PANEL
CO2: 26 mEq/L (ref 19–32)
Calcium: 8.9 mg/dL (ref 8.4–10.5)
Chloride: 105 mEq/L (ref 96–112)
GFR calc Af Amer: 73 mL/min — ABNORMAL LOW (ref >90)
Sodium: 140 mEq/L (ref 135–145)

## 2012-10-04 SURGERY — LEFT HEART CATHETERIZATION WITH CORONARY ANGIOGRAM
Anesthesia: LOCAL

## 2012-10-04 MED ORDER — MIDAZOLAM HCL 2 MG/2ML IJ SOLN
INTRAMUSCULAR | Status: AC
  Filled 2012-10-04: qty 2

## 2012-10-04 MED ORDER — VERAPAMIL HCL 2.5 MG/ML IV SOLN
INTRAVENOUS | Status: AC
  Filled 2012-10-04: qty 2

## 2012-10-04 MED ORDER — NITROGLYCERIN 0.2 MG/ML ON CALL CATH LAB
INTRAVENOUS | Status: AC
  Filled 2012-10-04: qty 1

## 2012-10-04 MED ORDER — HEPARIN SODIUM (PORCINE) 5000 UNIT/ML IJ SOLN
5000.0000 [IU] | Freq: Three times a day (TID) | INTRAMUSCULAR | Status: DC
  Administered 2012-10-05: 5000 [IU] via SUBCUTANEOUS
  Filled 2012-10-04 (×4): qty 1

## 2012-10-04 MED ORDER — HEPARIN (PORCINE) IN NACL 2-0.9 UNIT/ML-% IJ SOLN
INTRAMUSCULAR | Status: AC
  Filled 2012-10-04: qty 1000

## 2012-10-04 MED ORDER — CARVEDILOL 6.25 MG PO TABS
6.2500 mg | ORAL_TABLET | Freq: Two times a day (BID) | ORAL | Status: DC
  Administered 2012-10-05: 6.25 mg via ORAL
  Filled 2012-10-04 (×3): qty 1

## 2012-10-04 MED ORDER — DOCUSATE SODIUM 100 MG PO CAPS
100.0000 mg | ORAL_CAPSULE | Freq: Every day | ORAL | Status: DC | PRN
  Administered 2012-10-04: 100 mg via ORAL
  Filled 2012-10-04: qty 1

## 2012-10-04 MED ORDER — SODIUM CHLORIDE 0.9 % IV SOLN: 1.0000 mL/kg/h | INTRAVENOUS | Status: AC

## 2012-10-04 MED ORDER — LIDOCAINE HCL (PF) 1 % IJ SOLN
INTRAMUSCULAR | Status: AC
  Filled 2012-10-04: qty 30

## 2012-10-04 MED ORDER — FENTANYL CITRATE 0.05 MG/ML IJ SOLN
INTRAMUSCULAR | Status: AC
  Filled 2012-10-04: qty 2

## 2012-10-04 MED ORDER — ALPRAZOLAM 0.25 MG PO TABS
ORAL_TABLET | ORAL | Status: AC
  Filled 2012-10-04: qty 1

## 2012-10-04 MED ORDER — NITROGLYCERIN 0.4 MG/SPRAY TL SOLN: 1.0000 | Status: DC | PRN

## 2012-10-04 MED ORDER — ZOLPIDEM TARTRATE 5 MG PO TABS
5.0000 mg | ORAL_TABLET | Freq: Every evening | ORAL | Status: DC | PRN
  Administered 2012-10-04: 5 mg via ORAL
  Filled 2012-10-04: qty 1

## 2012-10-04 NOTE — CV Procedure (Signed)
   Cardiac Catheterization Procedure Note  Name: Heather Smith MRN: [id number] DOB: 01-Oct-1932  Procedure: Left Heart Cath, Selective Coronary Angiography, LV angiography  Indication: 79 year old white female presented with an episode of prolonged chest pain concerning for unstable angina. She has a history of hypertension and a left bundle branch block. Myoview study in April 2014 showed no significant ischemia. It was not gated.   Procedural details: The right wrist was prepped, draped, and anesthetized with 1% lidocaine. Using a modified Seldinger technique we were able to access the right radial artery without difficulty. We were unable to pass the wire past the elbow. Angiography demonstrated a small caliber radial artery with a tight radial loop with very acute angulation into the brachial artery. I did not feel that we could negotiate this with a catheter without risk of damage to the artery. The right groin was prepped, draped, and anesthetized with 1% lidocaine. Using modified Seldinger technique, a 5 French sheath was introduced into the right femoral artery. Standard Judkins catheters were used for coronary angiography and left ventriculography. Catheter exchanges were performed over a guidewire. There were no immediate procedural complications. The patient was transferred to the post catheterization recovery area for further monitoring.  Procedural Findings: Hemodynamics:  AO 146/75 with a mean of 104 mmHg LV 147/23 mmHg   Coronary angiography: Coronary dominance: right  Left mainstem: Normal.  Left anterior descending (LAD): The left anterior descending artery is a large vessel that extends to the apex. It is very tortuous and is normal. It gives off a large first diagonal branch which is also normal.  Left circumflex (LCx): Normal.  Right coronary artery (RCA): This is a dominant vessel. It is very tortuous and is normal.  Left ventriculography: Left ventricular size is  increased. There is severe global hypokinesis with overall ejection fraction estimated at 25-30%. There is no significant mitral insufficiency.  Final Conclusions:   1. Normal coronary anatomy. 2. Severe left ventricular dysfunction.  Recommendations: This patient's LV dysfunction is new compared to previous echocardiogram and Myoview study in 2010. She is already on an ACE inhibitor. We'll add carvedilol. Consider the addition of Aldactone. We'll obtain an echocardiogram.  Judi Cong 10/04/2012, 4:00 PM

## 2012-10-04 NOTE — Progress Notes (Signed)
Utilization review complete. Cabrina Shiroma RN CCM Case Mgmt phone [phone] 

## 2012-10-04 NOTE — Interval H&P Note (Signed)
History and Physical Interval Note:  10/04/2012 3:18 PM  Heather Smith  has presented today for surgery, with the diagnosis of cp  The various methods of treatment have been discussed with the patient and family. After consideration of risks, benefits and other options for treatment, the patient has consented to  Procedure(s): LEFT HEART CATHETERIZATION WITH CORONARY ANGIOGRAM (N/A) as a surgical intervention .  The patient's history has been reviewed, patient examined, no change in status, stable for surgery.  I have reviewed the patient's chart and labs.  Questions were answered to the patient's satisfaction.    Cath Lab Visit (complete for each Cath Lab visit)  Clinical Evaluation Leading to the Procedure:   ACS: yes  Non-ACS:    Anginal Classification: CCS III  Anti-ischemic medical therapy: No Therapy  Non-Invasive Test Results: Low-risk stress test findings: cardiac mortality <1%/year  Prior CABG: No previous CABG       Theron Arista [facility] 10/04/2012 3:18 PM

## 2012-10-05 ENCOUNTER — Encounter: Payer: Self-pay | Admitting: Cardiovascular Disease

## 2012-10-05 DIAGNOSIS — I428 Other cardiomyopathies: Secondary | ICD-10-CM

## 2012-10-05 DIAGNOSIS — I517 Cardiomegaly: Secondary | ICD-10-CM

## 2012-10-05 DIAGNOSIS — R079 Chest pain, unspecified: Secondary | ICD-10-CM

## 2012-10-05 DIAGNOSIS — R072 Precordial pain: Principal | ICD-10-CM | POA: Diagnosis present

## 2012-10-05 LAB — BASIC METABOLIC PANEL
CO2: 27 mEq/L (ref 19–32)
Chloride: 103 mEq/L (ref 96–112)
GFR calc Af Amer: 70 mL/min — ABNORMAL LOW (ref >90)
Potassium: 3.7 mEq/L (ref 3.5–5.1)

## 2012-10-05 LAB — URINE CULTURE: Colony Count: >=100000

## 2012-10-05 LAB — TSH: TSH: 1.448 u[IU]/mL (ref 0.350–4.500)

## 2012-10-05 MED ORDER — SPIRONOLACTONE 12.5 MG HALF TABLET
12.5000 mg | ORAL_TABLET | Freq: Every day | ORAL | Status: DC
  Administered 2012-10-05: 12.5 mg via ORAL
  Filled 2012-10-05: qty 1

## 2012-10-05 MED ORDER — CARVEDILOL 6.25 MG PO TABS: 6.2500 mg | ORAL_TABLET | Freq: Two times a day (BID) | ORAL | Status: DC

## 2012-10-05 MED ORDER — SPIRONOLACTONE 25 MG PO TABS: 12.5000 mg | ORAL_TABLET | Freq: Every day | ORAL | Status: DC

## 2012-10-05 NOTE — Discharge Summary (Signed)
CARDIOLOGY DISCHARGE SUMMARY   Patient ID: Heather Smith MRN: [id number] DOB/AGE: 03/20/1932 79 y.o.  Admit date: 10/03/2012 Discharge date: 10/05/2012  Primary Discharge Diagnosis:     Precordial pain Secondary Discharge Diagnosis:    HTN (hypertension)   Hyperlipemia  Procedures:  Left Heart Cath, Selective Coronary Angiography, LV angiography, 2-D echocardiogram  Hospital Course: Heather Smith is a 79 y.o. female with no history of CAD. She had a remote cath showing no CAD. She had DOE, chest pain and presyncope. She came to the hospital and was admitted for further evaluation and treatment.   Her cardiac enzymes were negative for MI. There was concern for CAD/angina so she was taken to the cath lab on 10/05/2012. The results are below. She had no CAD, but her EF was decreased. She had been on an ACE inhibitor and this will be continued. She is being started on a BB and potassium-sparing diuretic as well. There was no significant abnormality on her labs or ECG. An echo was performed, results below. She had diffuse hypokinesis with an EF 30-35%, but no RWMA. Her LA was dilated. This will be followed as an outpatient.   On 10/05/2012, Mx. Heather Smith was seen by Dr. Elease Hashimoto. She was feeling well and ambulating without chest pain or SOB. Dr. Elease Hashimoto evaluated her and reviewed the data. One of her PTA symptoms was DOE. He added Aldactone to her medication regimen and recommended she get a BMET at her f/u visit. He felt she had chronic systolic CHF but she is not currently volume overloaded. He considered her stable for discharge, to follow up as an outpatient.   Labs:  Lab Results  Component Value Date   WBC 4.4 10/04/2012   HGB 13.7 10/04/2012   HCT 38.5 10/04/2012   MCV 87.7 10/04/2012   PLT 146* 10/04/2012    Recent Labs Lab 10/03/12 1611  10/05/12 1055  NA 138  < > 138  K 4.5  < > 3.7  CL 102  < > 103  CO2 24  < > 27  BUN 25*  < > 18  CREATININE 1.12*  < > 0.89  CALCIUM 9.5  < > 9.1    PROT 7.2  --   --   BILITOT 0.4  --   --   ALKPHOS 68  --   --   ALT 13  --   --   AST 23  --   --   GLUCOSE 139*  < > 111*  < > = values in this interval not displayed.  Recent Labs  10/03/12 2140 10/04/12 0302 10/04/12 2223  TROPONINI <0.30 <0.30 <0.30    Recent Labs  10/03/12 2100  INR 0.85      Radiology: Dg Chest Portable 1 View 10/03/2012   CLINICAL DATA:  Chest pain.  EXAM: PORTABLE CHEST - 1 VIEW  COMPARISON:  05/16/2012  FINDINGS: Heart is borderline in size. Minimal left base atelectasis. Right lung is clear. No effusions or acute bony abnormality.  IMPRESSION: Minimal left base atelectasis.   Electronically Signed   By: Charlett Nose   On: 10/03/2012 16:49   Cardiac Cath: 10/04/2012 Left mainstem: Normal.  Left anterior descending (LAD): The left anterior descending artery is a large vessel that extends to the apex. It is very tortuous and is normal. It gives off a large first diagonal branch which is also normal.  Left circumflex (LCx): Normal.  Right coronary artery (RCA): This is a dominant vessel. It  is very tortuous and is normal.  Left ventriculography: Left ventricular size is increased. There is severe global hypokinesis with overall ejection fraction estimated at 25-30%. There is no significant mitral insufficiency.  Final Conclusions:  1. Normal coronary anatomy.  2. Severe left ventricular dysfunction. Recommendations: This patient's LV dysfunction is new compared to previous echocardiogram and Myoview study in 2010. She is already on an ACE inhibitor. We'll add carvedilol. Consider the addition of Aldactone.   EKG: SR, no acute ischemic changes; LBBB is old.  Echo: 10/05/2012 Study Conclusions - Left ventricle: The cavity size was moderately dilated. Wall thickness was normal. Systolic function was moderately to severely reduced. The estimated ejection fraction was in the range of 30% to 35%. Diffuse hypokinesis. - Left atrium: The atrium was  moderately dilated.  FOLLOW UP PLANS AND APPOINTMENTS Allergies  Allergen Reactions  . Niaspan [Niacin Er]     High doses, causes hot flashes & aching   . Statins Other (See Comments)    "Makes her bones hurt and very sore"  . Benadryl [Diphenhydramine Hcl] Other (See Comments)    Very jittery     Medication List    STOP taking these medications       nitroGLYCERIN 0.4 MG SL tablet  Commonly known as:  NITROSTAT      TAKE these medications       BIOTIN 5000 5 MG Caps  Generic drug:  Biotin  Take 5,000 mg by mouth daily.     carvedilol 6.25 MG tablet  Commonly known as:  COREG  Take 1 tablet (6.25 mg total) by mouth 2 (two) times daily with a meal.     cholecalciferol 1000 UNITS tablet  Commonly known as:  VITAMIN D  Take 1,000 Units by mouth daily.     levothyroxine 50 MCG tablet  Commonly known as:  SYNTHROID, LEVOTHROID  Take 50 mcg by mouth daily.     lisinopril 10 MG tablet  Commonly known as:  PRINIVIL,ZESTRIL  Take 10 mg by mouth daily.     Magnesium 250 MG Tabs  Take 250 mg by mouth daily.     meloxicam 15 MG tablet  Commonly known as:  MOBIC  Take 15 mg by mouth daily.     multivitamin per tablet  Take 1 tablet by mouth daily.     omeprazole 20 MG capsule  Commonly known as:  PRILOSEC  Take 40 mg by mouth daily.     ROBITUSSIN COUGH+CHEST CONG DM 10-200 MG Caps  Generic drug:  Dextromethorphan-Guaifenesin  Take 1 capsule by mouth daily as needed (for cold).     spironolactone 25 MG tablet  Commonly known as:  ALDACTONE  Take 0.5 tablets (12.5 mg total) by mouth daily.     SYSTANE 0.4-0.3 % Soln  Generic drug:  Polyethyl Glycol-Propyl Glycol  Place 2 drops into both eyes 2 (two) times daily as needed (for dry eyes).     vitamin C 1000 MG tablet  Take 1,000 mg by mouth at bedtime.     vitamin E 400 UNIT capsule  Take 400 Units by mouth daily.        Discharge Orders   Future Appointments Provider Department Dept Phone   10/19/2012  3:40 PM Beatrice Lecher, PA-C  [facility] Main Office [place]) [phone]   11/02/2012 10:45 AM Vesta Mixer, MD [facility] Main Office [place]) [phone]   Future Orders Complete By Expires   (HEART FAILURE PATIENTS) Call MD:  Anytime you have  any of the following symptoms: 1) 3 pound weight gain in 24 hours or 5 pounds in 1 week 2) shortness of breath, with or without a dry hacking cough 3) swelling in the hands, feet or stomach 4) if you have to sleep on extra pillows at night in order to breathe.  As directed    Diet - low sodium heart healthy  As directed    Increase activity slowly  As directed      Follow-up Information   Follow up with Tereso Newcomer, PA-C On 10/19/2012. (See for Dr. Elease Hashimoto at 3:40 pm.)    Specialty:  Physician Assistant   Contact information:   [address] [place] [address] [phone]       BRING ALL MEDICATIONS WITH YOU TO FOLLOW UP APPOINTMENTS  Time spent with patient to include physician time: 36 min Signed: Theodore Demark, PA-C 10/05/2012, 2:41 PM Co-Sign MD  Attending Note:   The patient was seen and examined.  Agree with assessment and plan as noted above.  Changes made to the above note as needed.  See my note from day of discharge.  She is stable from a cardiac standpoint.   Vesta Mixer, Montez Hageman., MD, [facility] 10/06/2012, 8:58 AM

## 2012-10-05 NOTE — Progress Notes (Signed)
PROGRESS NOTE  Subjective:   Heather Smith had a cath yesterday that revealed normal coronaries.  Her EF was decreased - 25-35%.  Coreg was added.   Objective:    Vital Signs:   Temp:  [97.5 F (36.4 C)-98.2 F (36.8 C)] 97.9 F (36.6 C) (09/03 0519) Pulse Rate:  [61-84] 61 (09/03 0519) Resp:  [16-20] 16 (09/03 0519) BP: (128-158)/(69-106) 132/81 mmHg (09/03 0519) SpO2:  [93 %-99 %] 99 % (09/03 0519)  Last BM Date: 10/03/12   24-hour weight change: Weight change:   Weight trends: Filed Weights   10/03/12 2036 10/03/12 2039  Weight: 179 lb 7.3 oz (81.4 kg) 179 lb 7.3 oz (81.4 kg)    Intake/Output:        Physical Exam: BP 132/81  Pulse 61  Temp(Src) 97.9 F (36.6 C) (Oral)  Resp 16  Ht 5\' 7"  (1.702 m)  Wt 179 lb 7.3 oz (81.4 kg)  BMI 28.1 kg/m2  SpO2 99%  General: Vital signs reviewed and noted.   Head: Normocephalic, atraumatic.  Eyes: conjunctivae/corneas clear.  EOM's intact.   Throat: normal  Neck:  normal  Lungs:    clear  Heart:  RR, normal S1, S2  Abdomen:  Soft, non-tender, non-distended    Extremities: Right radial cath site is ok   Neurologic: A&O X3, CN II - XII are grossly intact.   Psych: Normal     Labs: BMET:  Recent Labs  10/03/12 1611  10/04/12 0855 10/04/12 1830  NA 138  --  140  --   K 4.5  --  3.8  --   CL 102  --  105  --   CO2 24  --  26  --   GLUCOSE 139*  --  96  --   BUN 25*  --  19  --   CREATININE 1.12*  < > 0.86 0.85  CALCIUM 9.5  --  8.9  --   < > = values in this interval not displayed.  Liver function tests:  Recent Labs  10/03/12 1611  AST 23  ALT 13  ALKPHOS 68  BILITOT 0.4  PROT 7.2  ALBUMIN 3.9   No results found for this basename: LIPASE, AMYLASE,  in the last 72 hours  CBC:  Recent Labs  10/03/12 1611 10/03/12 2100 10/04/12 1830  WBC 5.7 6.7 4.4  NEUTROABS 3.6  --   --   HGB 14.0 15.0 13.7  HCT 40.8 42.1 38.5  MCV 89.5 89.0 87.7  PLT 164 172 146*    Cardiac Enzymes:  Recent  Labs  10/03/12 1612 10/03/12 2140 10/04/12 0302 10/04/12 2223  TROPONINI <0.30 <0.30 <0.30 <0.30    Coagulation Studies:  Recent Labs  10/03/12 2100  LABPROT 11.5*  INR 0.85    Other: No components found with this basename: POCBNP,  No results found for this basename: DDIMER,  in the last 72 hours No results found for this basename: HGBA1C,  in the last 72 hours No results found for this basename: CHOL, HDL, LDLCALC, TRIG, CHOLHDL,  in the last 72 hours  Recent Labs  10/04/12 1830  TSH 1.448   No results found for this basename: VITAMINB12, FOLATE, FERRITIN, TIBC, IRON, RETICCTPCT,  in the last 72 hours   Other results:  Tele:  nsr  Medications:    Infusions:    Scheduled Medications: . aspirin EC  81 mg Oral Daily  . carvedilol  6.25 mg Oral BID WC  . cholecalciferol  1,000  Units Oral Daily  . heparin  5,000 Units Subcutaneous Q8H  . levothyroxine  50 mcg Oral QAC breakfast  . lisinopril  10 mg Oral Daily  . multivitamin with minerals  1 tablet Oral Daily  . pantoprazole  40 mg Oral Daily    Assessment/ Plan:    1. Chronic systolic CHF:  Newly diagnoses.  Her coronaries are normal. On Coreg, Lisinopril.  Will add aldactone 12. 5 a day.    She is to have an echo today. She should be able to go home today after the ehco. Will need OV with the PA ( with BMP) in several weeks.  Will consider adding lasix if she has more symptoms.  Disposition:   Length of Stay: 2  Vesta Mixer, Montez Hageman., MD, [facility] 10/05/2012, 10:16 AM Office [phone] Pager [phone]

## 2012-10-05 NOTE — Progress Notes (Signed)
8119 We received order per PCI. Pt has normal coronaries and did not have PCI. We will not see pt unless new order written. Luetta Nutting RNBSN

## 2012-10-05 NOTE — Progress Notes (Signed)
  Echocardiogram 2D Echocardiogram has been performed.  Jorje Guild 10/05/2012, 12:18 PM

## 2012-10-19 ENCOUNTER — Ambulatory Visit (INDEPENDENT_AMBULATORY_CARE_PROVIDER_SITE_OTHER): Payer: Medicare HMO | Admitting: Physician Assistant

## 2012-10-19 ENCOUNTER — Encounter: Payer: Self-pay | Admitting: Physician Assistant

## 2012-10-19 VITALS — BP 138/82 | HR 64 | Ht 67.0 in | Wt 180.0 lb

## 2012-10-19 DIAGNOSIS — I10 Essential (primary) hypertension: Secondary | ICD-10-CM

## 2012-10-19 DIAGNOSIS — I428 Other cardiomyopathies: Secondary | ICD-10-CM

## 2012-10-19 DIAGNOSIS — I5022 Chronic systolic (congestive) heart failure: Secondary | ICD-10-CM

## 2012-10-19 MED ORDER — LISINOPRIL 10 MG PO TABS: 10.0000 mg | ORAL_TABLET | Freq: Two times a day (BID) | ORAL | Status: DC

## 2012-10-19 NOTE — Patient Instructions (Signed)
INCREASE LISINOPRIL TO 10MG  TWICE DAILY. A REFILL RX HAS BEEN SENT TO YOUR PHARMACY  LAB TODAY: BMET  LAB IN 2 WEEKS: BMET  Your physician has requested that you have an echocardiogram. Echocardiography is a painless test that uses sound waves to create images of your heart. It provides your doctor with information about the size and shape of your heart and how well your heart's chambers and valves are working. This procedure takes approximately one hour. There are no restrictions for this procedure.  ECHO TO BE SCHEDULED AFTER 01/04/2013  Your physician recommends that you schedule a follow-up appointment in: 4-6 WEEKS WITH DR.NAHSER

## 2012-10-19 NOTE — Progress Notes (Signed)
[address] [place], [place]  [address] Phone: [phone] Fax:  [phone]  Date:  10/19/2012   ID:  ANTONIETA SLAVEN, DOB 14-Feb-1932, MRN [id number]  PCP:  Johny Blamer, MD  Cardiologist:  Dr. Delane Ginger     History of Present Illness: Heather Smith is a 79 y.o. female who returns for follow up after a recent admission to the hospital with CP and SOB.   She has a hx of normal cardiac catheterization in the 1990s, HTN, HL, LBBB, hypothyroidism, GERD.  Myoview 4/14: Low risk, no ischemia, small, fixed mild apical septal perfusion defect; not gated. She was admitted 9/1-9/3 after presenting with chest pain, dyspnea and near syncope. She ruled out for myocardial infarction by enzymes. LHC 10/04/12: Normal coronary arteries, EF 25-30% with global HK.  Echocardiogram 10/05/12: EF 30-35%, diffuse HK, moderate LAE.  She was diagnosed with nonischemic cardiomyopathy. Medications were adjusted.  Since d/c she has been fatigued.  No CP.  No significant dyspnea.  She is NYHA Class II.  No orthopnea, PND, edema.   No syncope.  She had had more pain with DJD since having to stop Meloxicam.    Labs (9/14):  K 3.7, Cr 0.89, ALT 13, Hgb 13.7, TSH 1.448  Wt Readings from Last 3 Encounters:  10/19/12 180 lb (81.647 kg)  10/03/12 179 lb 7.3 oz (81.4 kg)  10/03/12 179 lb 7.3 oz (81.4 kg)     Past Medical History  Diagnosis Date  . Hypertension   . Hypercholesterolemia   . Left bundle branch block   . Hypothyroidism   . Osteoarthritis   . Spinal stenosis   . GERD (gastroesophageal reflux disease)   . Vitamin D deficiency   . Neurogenic claudication due to lumbar spinal stenosis   . Chest pain     a. Nl cath in the 90's;  b. 04/2005: Myoview: subtle areao of ? reversibility in apical segment of anteromedial LV - ? attenuation, EF 54%;  c. 05/2012 MV: small, fixed mild apical septal perfusion defect, ? attenuation due to lbbb, no ischemia.  . Non-ischemic cardiomyopathy     LHC  10/04/12: Normal coronary arteries, EF 25-30% with global HK.  Echocardiogram 10/05/12: EF 30-35%, diffuse HK, moderate LAE.   Marland Kitchen Chronic systolic heart failure     Current Outpatient Prescriptions  Medication Sig Dispense Refill  . Ascorbic Acid (VITAMIN C) 1000 MG tablet Take 1,000 mg by mouth at bedtime.       . Biotin (BIOTIN 5000) 5 MG CAPS Take 5,000 mg by mouth daily.      . carvedilol (COREG) 6.25 MG tablet Take 1 tablet (6.25 mg total) by mouth 2 (two) times daily with a meal.  60 tablet  11  . cholecalciferol (VITAMIN D) 1000 UNITS tablet Take 1,000 Units by mouth daily.        Marland Kitchen levothyroxine (SYNTHROID, LEVOTHROID) 50 MCG tablet Take 50 mcg by mouth daily.        Marland Kitchen lisinopril (PRINIVIL,ZESTRIL) 10 MG tablet Take 10 mg by mouth daily.        . Magnesium 250 MG TABS Take 250 mg by mouth daily.       . multivitamin (THERAGRAN) per tablet Take 1 tablet by mouth daily.        Marland Kitchen omeprazole (PRILOSEC) 20 MG capsule Take 40 mg by mouth daily.       Bertram Gala Glycol-Propyl Glycol (SYSTANE) 0.4-0.3 % SOLN Place 2 drops into both eyes  2 (two) times daily as needed (for dry eyes).      Marland Kitchen spironolactone (ALDACTONE) 25 MG tablet Take 0.5 tablets (12.5 mg total) by mouth daily.  30 tablet  11  . vitamin E 400 UNIT capsule Take 400 Units by mouth daily.       No current facility-administered medications for this visit.    Allergies:    Allergies  Allergen Reactions  . Niaspan [Niacin Er]     High doses, causes hot flashes & aching   . Statins Other (See Comments)    "Makes her bones hurt and very sore"  . Benadryl [Diphenhydramine Hcl] Other (See Comments)    Very jittery    Social History:  The patient  reports that she has never smoked. She does not have any smokeless tobacco history on file. She reports that she does not drink alcohol or use illicit drugs.   ROS:  Please see the history of present illness.   All other systems reviewed and negative.   PHYSICAL EXAM: VS:  BP 138/82   Pulse 64  Ht 5\' 7"  (1.702 m)  Wt 180 lb (81.647 kg)  BMI 28.19 kg/m2 Well nourished, well developed, in no acute distress HEENT: normal Neck: no JVD Cardiac:  normal S1, S2; RRR; no murmur Lungs:  clear to auscultation bilaterally, no wheezing, rhonchi or rales Abd: soft, nontender, no hepatomegaly Ext: no edema; right wrist without hematoma or mass  Skin: warm and dry Neuro:  CNs 2-12 intact, no focal abnormalities noted  EKG:  NSR, HR 64, LBBB     ASSESSMENT AND PLAN:  1. Chronic Systolic CHF:  Volume stable.  Continue current dose of beta blocker and spironolactone.  Increase Lisinopril to 10 bid.  Check BMET today and again in 2 weeks.  We discussed the importance of weighing daily and avoiding NaCl.   2. Non-Ischemic CM:  As above.  Plan follow up echo in 3 mos.  3. Hypertension:  Controlled. 4. DJD: f/u with PCP for management.  I advised her to try Tylenol. 5. Disposition:  F/u with Dr. Delane Ginger in 4-6 weeks.   Signed, Tereso Newcomer, PA-C  10/19/2012 4:05 PM

## 2012-10-20 ENCOUNTER — Telehealth: Payer: Self-pay | Admitting: *Deleted

## 2012-10-20 LAB — BASIC METABOLIC PANEL
CO2: 26 mEq/L (ref 19–32)
Chloride: 102 mEq/L (ref 96–112)
Glucose, Bld: 96 mg/dL (ref 70–99)
Potassium: 4.5 mEq/L (ref 3.5–5.1)
Sodium: 137 mEq/L (ref 135–145)

## 2012-10-20 NOTE — Telephone Encounter (Signed)
pt notified about lab results with verbal understanding  

## 2012-11-02 ENCOUNTER — Ambulatory Visit: Payer: Medicare HMO | Admitting: Cardiovascular Disease

## 2012-11-02 ENCOUNTER — Other Ambulatory Visit: Payer: Medicare HMO

## 2012-11-03 ENCOUNTER — Other Ambulatory Visit (INDEPENDENT_AMBULATORY_CARE_PROVIDER_SITE_OTHER): Payer: Medicare HMO

## 2012-11-03 DIAGNOSIS — I428 Other cardiomyopathies: Secondary | ICD-10-CM

## 2012-11-03 DIAGNOSIS — I10 Essential (primary) hypertension: Secondary | ICD-10-CM

## 2012-11-03 DIAGNOSIS — I5022 Chronic systolic (congestive) heart failure: Secondary | ICD-10-CM

## 2012-11-03 LAB — BASIC METABOLIC PANEL
BUN: 24 mg/dL — ABNORMAL HIGH (ref 6–23)
CO2: 30 mEq/L (ref 19–32)
Chloride: 101 mEq/L (ref 96–112)
Glucose, Bld: 86 mg/dL (ref 70–99)
Potassium: 4.6 mEq/L (ref 3.5–5.1)

## 2012-12-08 ENCOUNTER — Other Ambulatory Visit: Payer: Self-pay

## 2012-12-15 ENCOUNTER — Encounter: Payer: Self-pay | Admitting: Cardiovascular Disease

## 2012-12-15 ENCOUNTER — Ambulatory Visit (INDEPENDENT_AMBULATORY_CARE_PROVIDER_SITE_OTHER): Payer: Medicare HMO | Admitting: Cardiovascular Disease

## 2012-12-15 VITALS — BP 144/80 | HR 59 | Ht 67.0 in | Wt 180.1 lb

## 2012-12-15 DIAGNOSIS — I5022 Chronic systolic (congestive) heart failure: Secondary | ICD-10-CM | POA: Insufficient documentation

## 2012-12-15 DIAGNOSIS — I10 Essential (primary) hypertension: Secondary | ICD-10-CM

## 2012-12-15 DIAGNOSIS — E785 Hyperlipidemia, unspecified: Secondary | ICD-10-CM

## 2012-12-15 DIAGNOSIS — I5023 Acute on chronic systolic (congestive) heart failure: Secondary | ICD-10-CM

## 2012-12-15 DIAGNOSIS — I509 Heart failure, unspecified: Secondary | ICD-10-CM

## 2012-12-15 LAB — BASIC METABOLIC PANEL
BUN: 25 mg/dL — ABNORMAL HIGH (ref 6–23)
CO2: 27 mEq/L (ref 19–32)
Calcium: 9.6 mg/dL (ref 8.4–10.5)
Creatinine, Ser: 1 mg/dL (ref 0.4–1.2)
Glucose, Bld: 122 mg/dL — ABNORMAL HIGH (ref 70–99)
Sodium: 138 mEq/L (ref 135–145)

## 2012-12-15 MED ORDER — LISINOPRIL 20 MG PO TABS: 20.0000 mg | ORAL_TABLET | Freq: Two times a day (BID) | ORAL | Status: DC

## 2012-12-15 NOTE — Patient Instructions (Addendum)
Your physician recommends that you return for lab work in: today bmet  Your physician recommends that you return for lab work in: 3 weeks  Your physician recommends that you schedule a follow-up appointment in: January 2015   Your physician has recommended you make the following change in your medication:  Increase lisinopril to 20 mg twice daily 12 hours apart

## 2012-12-15 NOTE — Assessment & Plan Note (Signed)
Heather Smith still is not feeling as well as she would like.  We have been gradually increasing ACE-i.  Her hR is in the 50s so we cannot increae coreg .  Will increase lisinopril to 20 bid and check BMP in 3 weeks.    She is scheduled for repeat echo in Dec.  i will see her again in January.  She has a LBBB.  If her EF remains low after we have her on max medical therapy, she will be referred for ICD / Bi-V pacer.

## 2012-12-15 NOTE — Progress Notes (Signed)
[address] [place], [place]  [address] Phone: [phone] Fax:  [phone]  Date:  12/15/2012   ID:  Heather Smith, DOB 03/17/1932, MRN [id number]  PCP:  Johny Blamer, MD  Cardiologist:  Dr. Delane Ginger      Problem List 1. Acute on chronic systlic CHf- normal cors by cath 2. LBBB 3. HTN 4. Hypothyroidism  History of Present Illness: Heather Smith is a 79 y.o. female who returns for follow up after a recent admission to the hospital with CP and SOB.   She has a hx of normal cardiac catheterization in the 1990s, HTN, HL, LBBB, hypothyroidism, GERD.  Myoview 4/14: Low risk, no ischemia, small, fixed mild apical septal perfusion defect; not gated. She was admitted 9/1-9/3 after presenting with chest pain, dyspnea and near syncope. She ruled out for myocardial infarction by enzymes. LHC 10/04/12: Normal coronary arteries, EF 25-30% with global HK.  Echocardiogram 10/05/12: EF 30-35%, diffuse HK, moderate LAE.  She was diagnosed with nonischemic cardiomyopathy. Medications were adjusted.  Since d/c she has been fatigued.  No CP.  No significant dyspnea.  She is NYHA Class II.  No orthopnea, PND, edema.   No syncope.  She had had more pain with DJD since having to stop Meloxicam.    Nov. 13, 2014:  She is feeling poorly - fatigued.  Also is having lots of arthritis problems because she has not been able to take her arthritis meds because of her systolic chf.    Wt Readings from Last 3 Encounters:  12/15/12 180 lb 1.9 oz (81.702 kg)  10/19/12 180 lb (81.647 kg)  10/03/12 179 lb 7.3 oz (81.4 kg)     Past Medical History  Diagnosis Date  . Hypertension   . Hypercholesterolemia   . Left bundle branch block   . Hypothyroidism   . Osteoarthritis   . Spinal stenosis   . GERD (gastroesophageal reflux disease)   . Vitamin D deficiency   . Neurogenic claudication due to lumbar spinal stenosis   . Chest pain     a. Nl cath in the 90's;  b. 04/2005:  Myoview: subtle areao of ? reversibility in apical segment of anteromedial LV - ? attenuation, EF 54%;  c. 05/2012 MV: small, fixed mild apical septal perfusion defect, ? attenuation due to lbbb, no ischemia.  . Non-ischemic cardiomyopathy     LHC 10/04/12: Normal coronary arteries, EF 25-30% with global HK.  Echocardiogram 10/05/12: EF 30-35%, diffuse HK, moderate LAE.   Marland Kitchen Chronic systolic heart failure     Current Outpatient Prescriptions  Medication Sig Dispense Refill  . Ascorbic Acid (VITAMIN C) 1000 MG tablet Take 1,000 mg by mouth at bedtime.       . Biotin (BIOTIN 5000) 5 MG CAPS Take 5,000 mg by mouth daily.      . carvedilol (COREG) 6.25 MG tablet Take 1 tablet (6.25 mg total) by mouth 2 (two) times daily with a meal.  60 tablet  11  . cholecalciferol (VITAMIN D) 1000 UNITS tablet Take 1,000 Units by mouth daily.        Marland Kitchen levothyroxine (SYNTHROID, LEVOTHROID) 50 MCG tablet Take 50 mcg by mouth daily.        Marland Kitchen lisinopril (PRINIVIL,ZESTRIL) 10 MG tablet Take 1 tablet (10 mg total) by mouth 2 (two) times daily.  60 tablet  11  . Magnesium 250 MG TABS Take 250 mg by mouth daily.       Marland Kitchen  multivitamin (THERAGRAN) per tablet Take 1 tablet by mouth daily.        Marland Kitchen omeprazole (PRILOSEC) 20 MG capsule Take 40 mg by mouth daily.       Bertram Gala Glycol-Propyl Glycol (SYSTANE) 0.4-0.3 % SOLN Place 2 drops into both eyes 2 (two) times daily as needed (for dry eyes).      Marland Kitchen spironolactone (ALDACTONE) 25 MG tablet Take 0.5 tablets (12.5 mg total) by mouth daily.  30 tablet  11  . traMADol (ULTRAM) 50 MG tablet Take 1 tablet by mouth as needed.      . vitamin E 400 UNIT capsule Take 400 Units by mouth daily.       No current facility-administered medications for this visit.    Allergies:    Allergies  Allergen Reactions  . Niaspan [Niacin Er]     High doses, causes hot flashes & aching   . Statins Other (See Comments)    "Makes her bones hurt and very sore"  . Benadryl [Diphenhydramine Hcl]  Other (See Comments)    Very jittery    Social History:  The patient  reports that she has never smoked. She does not have any smokeless tobacco history on file. She reports that she does not drink alcohol or use illicit drugs.   ROS:  Please see the history of present illness.   All other systems reviewed and negative.   PHYSICAL EXAM: VS:  BP 144/80  Pulse 59  Ht 5\' 7"  (1.702 m)  Wt 180 lb 1.9 oz (81.702 kg)  BMI 28.20 kg/m2 Well nourished, well developed, in no acute distress HEENT: normal Neck: no JVD Cardiac:  normal S1, S2; RRR; no murmur Lungs:  clear to auscultation bilaterally, no wheezing, rhonchi or rales Abd: soft, nontender, no hepatomegaly Ext: no edema; right wrist without hematoma or mass  Skin: warm and dry Neuro:  CNs 2-12 intact, no focal abnormalities noted  EKG: Nov. 13, 2014:  NSR at 80 with frequent PVCs, LBBB     ASSESSMENT AND PLAN:

## 2013-01-05 ENCOUNTER — Ambulatory Visit (INDEPENDENT_AMBULATORY_CARE_PROVIDER_SITE_OTHER): Payer: Medicare HMO | Admitting: *Deleted

## 2013-01-05 DIAGNOSIS — I10 Essential (primary) hypertension: Secondary | ICD-10-CM

## 2013-01-05 LAB — BASIC METABOLIC PANEL
BUN: 30 mg/dL — ABNORMAL HIGH (ref 6–23)
Calcium: 9.5 mg/dL (ref 8.4–10.5)
Chloride: 102 mEq/L (ref 96–112)
Creatinine, Ser: 1 mg/dL (ref 0.4–1.2)

## 2013-01-11 ENCOUNTER — Encounter: Payer: Self-pay | Admitting: Cardiovascular Disease

## 2013-01-12 ENCOUNTER — Ambulatory Visit (HOSPITAL_COMMUNITY): Payer: Medicare HMO | Attending: Cardiovascular Disease | Admitting: Radiology

## 2013-01-12 ENCOUNTER — Encounter: Payer: Self-pay | Admitting: Physician Assistant

## 2013-01-12 ENCOUNTER — Encounter: Payer: Self-pay | Admitting: Cardiovascular Disease

## 2013-01-12 DIAGNOSIS — E785 Hyperlipidemia, unspecified: Secondary | ICD-10-CM | POA: Insufficient documentation

## 2013-01-12 DIAGNOSIS — I10 Essential (primary) hypertension: Secondary | ICD-10-CM | POA: Insufficient documentation

## 2013-01-12 DIAGNOSIS — I5022 Chronic systolic (congestive) heart failure: Secondary | ICD-10-CM

## 2013-01-12 DIAGNOSIS — I428 Other cardiomyopathies: Secondary | ICD-10-CM

## 2013-01-12 DIAGNOSIS — I509 Heart failure, unspecified: Secondary | ICD-10-CM | POA: Insufficient documentation

## 2013-01-12 DIAGNOSIS — I447 Left bundle-branch block, unspecified: Secondary | ICD-10-CM | POA: Insufficient documentation

## 2013-01-12 DIAGNOSIS — I079 Rheumatic tricuspid valve disease, unspecified: Secondary | ICD-10-CM | POA: Insufficient documentation

## 2013-01-12 NOTE — Progress Notes (Signed)
Echocardiogram performed.  

## 2013-01-17 ENCOUNTER — Encounter: Payer: Self-pay | Admitting: Cardiovascular Disease

## 2013-01-17 ENCOUNTER — Ambulatory Visit (INDEPENDENT_AMBULATORY_CARE_PROVIDER_SITE_OTHER): Payer: Commercial Managed Care - HMO | Admitting: Cardiovascular Disease

## 2013-01-17 VITALS — BP 108/70 | HR 68 | Ht 67.0 in | Wt 178.0 lb

## 2013-01-17 DIAGNOSIS — I509 Heart failure, unspecified: Secondary | ICD-10-CM

## 2013-01-17 DIAGNOSIS — I5023 Acute on chronic systolic (congestive) heart failure: Secondary | ICD-10-CM

## 2013-01-17 NOTE — Patient Instructions (Signed)
You have been referred to Dr Graciela Husbands for discussion of a Bi-V ICD/ please make an app ASAP per Dr Nahser/ Dr Graciela Husbands aware.  Your physician recommends that you schedule a follow-up appointment in: 3 months with Dr Elease Hashimoto  Your physician recommends that you continue on your current medications as directed. Please refer to the Current Medication list given to you today.

## 2013-01-17 NOTE — Progress Notes (Signed)
[address] [place], [place]  [address] Phone: [phone] Fax:  [phone]  Date:  01/17/2013   ID:  Heather Smith, DOB 1932/04/14, MRN [id number]  PCP:  Johny Blamer, MD  Cardiologist:  Dr. Delane Ginger      Problem List 1. Acute on chronic systlic CHf- normal cors by cath 2. LBBB 3. HTN 4. Hypothyroidism  History of Present Illness: Heather Smith is a 79 y.o. female who returns for follow up after a recent admission to the hospital with CP and SOB.   She has a hx of normal cardiac catheterization in the 1990s, HTN, HL, LBBB, hypothyroidism, GERD.  Myoview 4/14: Low risk, no ischemia, small, fixed mild apical septal perfusion defect; not gated. She was admitted 9/1-9/3 after presenting with chest pain, dyspnea and near syncope. She ruled out for myocardial infarction by enzymes. LHC 10/04/12: Normal coronary arteries, EF 25-30% with global HK.  Echocardiogram 10/05/12: EF 30-35%, diffuse HK, moderate LAE.  She was diagnosed with nonischemic cardiomyopathy. Medications were adjusted.  Since d/c she has been fatigued.  No CP.  No significant dyspnea.  She is NYHA Class II.  No orthopnea, PND, edema.   No syncope.  She had had more pain with DJD since having to stop Meloxicam.    Nov. 13, 2014:  She is feeling poorly - fatigued.  Also is having lots of arthritis problems because she has not been able to take her arthritis meds because of her systolic chf.  Dec. 16, 2014:  Heather Smith  Continues to have dyspnea.  Recent echo shows:  Persistently depressed left ventricular systolic function with an ejection fraction of 20-25%.  She is till limited in her activities.  She has some mild lightheadedness.   Wt Readings from Last 3 Encounters:  01/17/13 178 lb (80.74 kg)  12/15/12 180 lb 1.9 oz (81.702 kg)  10/19/12 180 lb (81.647 kg)     Past Medical History  Diagnosis Date  . Hypertension   . Hypercholesterolemia   . Left bundle branch block   .  Hypothyroidism   . Osteoarthritis   . Spinal stenosis   . GERD (gastroesophageal reflux disease)   . Vitamin D deficiency   . Neurogenic claudication due to lumbar spinal stenosis   . Chest pain     a. Nl cath in the 90's;  b. 04/2005: Myoview: subtle areao of ? reversibility in apical segment of anteromedial LV - ? attenuation, EF 54%;  c. 05/2012 MV: small, fixed mild apical septal perfusion defect, ? attenuation due to lbbb, no ischemia.  . Non-ischemic cardiomyopathy     LHC 10/04/12: Normal coronary arteries, EF 25-30% with global HK.  Echocardiogram 10/05/12: EF 30-35%, diffuse HK, moderate LAE. Echocardiogram (01/2013): Lateral to septal wall dyssynchrony EF 20-25%, diffuse HK  . Chronic systolic heart failure     Current Outpatient Prescriptions  Medication Sig Dispense Refill  . Ascorbic Acid (VITAMIN C) 1000 MG tablet Take 1,000 mg by mouth at bedtime.       . Biotin (BIOTIN 5000) 5 MG CAPS Take 5,000 mg by mouth daily.      . carvedilol (COREG) 6.25 MG tablet Take 1 tablet (6.25 mg total) by mouth 2 (two) times daily with a meal.  60 tablet  11  . cholecalciferol (VITAMIN D) 1000 UNITS tablet Take 1,000 Units by mouth daily.        Marland Kitchen levothyroxine (SYNTHROID, LEVOTHROID) 50 MCG tablet Take 50 mcg  by mouth daily.        Marland Kitchen lisinopril (PRINIVIL,ZESTRIL) 20 MG tablet Take 1 tablet (20 mg total) by mouth 2 (two) times daily.  60 tablet  11  . Magnesium 250 MG TABS Take 250 mg by mouth daily.       . multivitamin (THERAGRAN) per tablet Take 1 tablet by mouth daily.        Marland Kitchen omeprazole (PRILOSEC) 20 MG capsule Take 40 mg by mouth daily.       Marland Kitchen spironolactone (ALDACTONE) 25 MG tablet Take 0.5 tablets (12.5 mg total) by mouth daily.  30 tablet  11  . traMADol (ULTRAM) 50 MG tablet Take 1 tablet by mouth as needed.      . vitamin E 400 UNIT capsule Take 400 Units by mouth daily.       No current facility-administered medications for this visit.    Allergies:    Allergies  Allergen  Reactions  . Niaspan [Niacin Er]     High doses, causes hot flashes & aching   . Statins Other (See Comments)    "Makes her bones hurt and very sore"  . Benadryl [Diphenhydramine Hcl] Other (See Comments)    Very jittery    Social History:  The patient  reports that she has never smoked. She does not have any smokeless tobacco history on file. She reports that she does not drink alcohol or use illicit drugs.   ROS:  Please see the history of present illness.   All other systems reviewed and negative.   PHYSICAL EXAM: VS:  BP 108/70  Pulse 68  Ht 5\' 7"  (1.702 m)  Wt 178 lb (80.74 kg)  BMI 27.87 kg/m2 Well nourished, well developed, in no acute distress HEENT: normal Neck: no JVD Cardiac:  normal S1, S2; RRR; no murmur Lungs:  clear to auscultation bilaterally, no wheezing, rhonchi or rales Abd: soft, nontender, no hepatomegaly Ext: no edema; right wrist without hematoma or mass  Skin: warm and dry Neuro:  CNs 2-12 intact, no focal abnormalities noted  EKG: Nov. 13, 2014:  NSR at 28 with frequent PVCs, LBBB     ASSESSMENT AND PLAN:

## 2013-01-17 NOTE — Assessment & Plan Note (Signed)
Heather Smith continues to have symptoms consistent with chronic systolic congestive heart failure. She started have some orthostasis. She's on maximal dose of carvedilol, lisinopril and on a moderate dose of Aldactone. Her symptoms are limiting any further increases of her medications at this time.  I discussed the case with Dr. Graciela Husbands. We will refer her to electrophysiology. I suspect that she'll need a biventricular pacemaker/ICD.

## 2013-02-08 ENCOUNTER — Ambulatory Visit (INDEPENDENT_AMBULATORY_CARE_PROVIDER_SITE_OTHER): Payer: Commercial Managed Care - HMO | Admitting: Internal Medicine

## 2013-02-08 ENCOUNTER — Encounter: Payer: Self-pay | Admitting: *Deleted

## 2013-02-08 ENCOUNTER — Encounter: Payer: Self-pay | Admitting: Internal Medicine

## 2013-02-08 VITALS — BP 125/71 | HR 59 | Ht 67.0 in | Wt 177.8 lb

## 2013-02-08 DIAGNOSIS — I428 Other cardiomyopathies: Secondary | ICD-10-CM

## 2013-02-08 DIAGNOSIS — R001 Bradycardia, unspecified: Secondary | ICD-10-CM | POA: Insufficient documentation

## 2013-02-08 DIAGNOSIS — Z01812 Encounter for preprocedural laboratory examination: Secondary | ICD-10-CM

## 2013-02-08 DIAGNOSIS — I4949 Other premature depolarization: Secondary | ICD-10-CM

## 2013-02-08 DIAGNOSIS — I493 Ventricular premature depolarization: Secondary | ICD-10-CM | POA: Insufficient documentation

## 2013-02-08 DIAGNOSIS — I509 Heart failure, unspecified: Secondary | ICD-10-CM

## 2013-02-08 DIAGNOSIS — I5023 Acute on chronic systolic (congestive) heart failure: Secondary | ICD-10-CM

## 2013-02-08 DIAGNOSIS — I10 Essential (primary) hypertension: Secondary | ICD-10-CM

## 2013-02-08 DIAGNOSIS — I447 Left bundle-branch block, unspecified: Secondary | ICD-10-CM | POA: Insufficient documentation

## 2013-02-08 DIAGNOSIS — I498 Other specified cardiac arrhythmias: Secondary | ICD-10-CM

## 2013-02-08 MED ORDER — LISINOPRIL 20 MG PO TABS: 20.0000 mg | ORAL_TABLET | Freq: Every day | ORAL | Status: DC

## 2013-02-08 MED ORDER — CITALOPRAM HYDROBROMIDE 10 MG PO TABS: 10.0000 mg | ORAL_TABLET | Freq: Every day | ORAL | Status: DC

## 2013-02-08 NOTE — Patient Instructions (Addendum)
Your physician has recommended you make the following change in your medication:  1) Decrease Lisinopril to 20 mg daily at bedtime. 2) Start Celexa 10 mg daily   CRT or cardiac resynchronization therapy is a treatment used to correct heart failure. When you have heart failure your heart is weakened and doesn't pump as well as it should. This therapy may help reduce symptoms and improve the quality of life.  Please see the handout/brochure given to you today to get more information of the different options of therapy.  You are scheduled for 02/22/13    You will have pre-procedure lab work done 02/17/13 (letter of instructions will be available to pick up at front desk when you come for your lab work_

## 2013-02-08 NOTE — Progress Notes (Signed)
ELECTROPHYSIOLOGY CONSULT NOTE  Patient ID: Heather Smith, MRN: [id number], DOB/AGE: [birth year] 80 y.o. Admit date: (Not on file) Date of Consult: 02/08/2013  Primary Physician: Shirline Frees, MD Primary Cardiologist [facility]  Chief Complaint:  ?  CRT   HPI Heather Smith is a 80 y.o. female  Referred for consideration of CRT. She is 80 year old woman with a history of nonischemic heart disease confirmed by catheterization 9/14. She presented with chest pain and dyspnea. Ejection fraction was found to be 25-35% Repeat echo 12/14 showed ejection fraction of 20-25% despite guidelines directed medical therapy with carvedilol lisinopril and spironolactone.  She continues to struggle with fatigue and exercise intolerance manifessted by DOE < 100yds and stairs  No edema or PND, no palpitations or syncope       Past Medical History  Diagnosis Date  . Hypertension   . Hypercholesterolemia   . Left bundle branch block   . Hypothyroidism   . Osteoarthritis   . Spinal stenosis   . GERD (gastroesophageal reflux disease)   . Vitamin D deficiency   . Neurogenic claudication due to lumbar spinal stenosis   . Chest pain     a. Nl cath in the 90's;  b. 04/2005: Myoview: subtle areao of ? reversibility in apical segment of anteromedial LV - ? attenuation, EF 54%;  c. 05/2012 MV: small, fixed mild apical septal perfusion defect, ? attenuation due to lbbb, no ischemia.  . Non-ischemic cardiomyopathy     LHC 10/04/12: Normal coronary arteries, EF 25-30% with global HK.  Echocardiogram 10/05/12: EF 30-35%, diffuse HK, moderate LAE. Echocardiogram (01/2013): Lateral to septal wall dyssynchrony EF 20-25%, diffuse HK  . Chronic systolic heart failure       Surgical History:  Past Surgical History  Procedure Laterality Date  . Tubal ligation    . Foot arthrodesis, modified mcbride    . Knee arthroscopy      left  . Knee arthroscopy      right  . Cardiac catheterization  2000   . Ptca    . Lumbar laminectomy  11/03/2006     Bilateral 3, 4, 5 laminectomy, partial L2, decompression of the thecal sac, foraminotomy, posterolateral arthrodesis L3-S1 with autograft   -- SURGEON:  Leeroy Cha, M.D.      Home Meds: Prior to Admission medications   Medication Sig Start Date End Date Taking? Authorizing Provider  Ascorbic Acid (VITAMIN C) 1000 MG tablet Take 1,000 mg by mouth at bedtime.    Yes Historical Provider, MD  Biotin (BIOTIN 5000) 5 MG CAPS Take 5,000 mg by mouth daily.   Yes Historical Provider, MD  carvedilol (COREG) 6.25 MG tablet Take 1 tablet (6.25 mg total) by mouth 2 (two) times daily with a meal. 10/05/12  Yes Rhonda G Barrett, PA-C  cholecalciferol (VITAMIN D) 1000 UNITS tablet Take 1,000 Units by mouth daily.     Yes Historical Provider, MD  levothyroxine (SYNTHROID, LEVOTHROID) 50 MCG tablet Take 50 mcg by mouth daily.     Yes Historical Provider, MD  lisinopril (PRINIVIL,ZESTRIL) 20 MG tablet Take 1 tablet (20 mg total) by mouth 2 (two) times daily. 12/15/12  Yes Thayer Headings, MD  Magnesium 250 MG TABS Take 250 mg by mouth daily.    Yes Historical Provider, MD  multivitamin [facility]) per tablet Take 1 tablet by mouth daily.     Yes Historical Provider, MD  omeprazole (PRILOSEC) 20 MG capsule Take 40 mg by mouth daily.  Yes Historical Provider, MD  spironolactone (ALDACTONE) 25 MG tablet Take 0.5 tablets (12.5 mg total) by mouth daily. 10/05/12  Yes Rhonda G Barrett, PA-C  traMADol (ULTRAM) 50 MG tablet Take 1 tablet by mouth as needed. 10/06/12  Yes Historical Provider, MD  vitamin E 400 UNIT capsule Take 400 Units by mouth daily.   Yes Historical Provider, MD    Inpatient Medications:     Allergies:  Allergies  Allergen Reactions  . Niaspan [Niacin Er]     High doses, causes hot flashes & aching   . Statins Other (See Comments)    "Makes her bones hurt and very sore"  . Benadryl [Diphenhydramine Hcl] Other (See Comments)    Very jittery     History   Social History  . Marital Status: Married    Spouse Name: N/A    Number of Children: N/A  . Years of Education: N/A   Occupational History  . Not on file.   Social History Main Topics  . Smoking status: Never Smoker   . Smokeless tobacco: Not on file  . Alcohol Use: No  . Drug Use: No  . Sexual Activity: Not on file   Other Topics Concern  . Not on file   Social History Narrative   Lives in [place], [place] with husband.  Active around the house.     Family History  Problem Relation Age of Onset  . Heart attack Mother     died @ 78.  . Thyroid disease Mother   . Lung cancer Mother   . Hyperlipidemia Mother   . Aneurysm Father     died @ 82.  . Aneurysm Brother     has had an Aneurysm x3  . Colon cancer Brother   . Bone cancer Brother   . Lung cancer Brother   . Stomach cancer Brother      ROS:  Please see the history of present illness.    All other systems reviewed and negative.    Physical Exam: Blood pressure 125/71, pulse 59, height 5\' 7"  (1.702 m), weight 177 lb 12.8 oz (80.65 kg). General: Well developed, well nourished female in no acute distress. Head: Normocephalic, atraumatic, sclera non-icteric, no xanthomas, nares are without discharge. EENT: normal Lymph Nodes:  none Back: without scoliosis/kyphosis, no CVA tendersness Neck: Negative for carotid bruits. JVD not elevated. Lungs: Clear bilaterally to auscultation without wheezes, rales, or rhonchi. Breathing is unlabored. Heart: RRR with S1 S2. PMI displaced Abdomen: Soft, non-tender, non-distended with normoactive bowel sounds. No hepatomegaly. No rebound/guarding. No obvious abdominal masses. Msk:  Strength and tone appear normal for age. Extremities: No clubbing or cyanosis. No edema.  Distal pedal pulses are 2+ and equal bilaterally. Skin: Warm and Dry Neuro: Alert and oriented X 3. CN III-XII intact Grossly normal sensory and motor function . Psych:  Responds to questions  appropriately with a sad and tearful affect.      Labs: Cardiac Enzymes No results found for this basename: CKTOTAL, CKMB, TROPONINI,  in the last 72 hours CBC Lab Results  Component Value Date   WBC 4.4 10/04/2012   HGB 13.7 10/04/2012   HCT 38.5 10/04/2012   MCV 87.7 10/04/2012   PLT 146* 10/04/2012   PROTIME: No results found for this basename: LABPROT, INR,  in the last 72 hours Chemistry No results found for this basename: NA, K, CL, CO2, BUN, CREATININE, CALCIUM, LABALBU, PROT, BILITOT, ALKPHOS, ALT, AST, GLUCOSE,  in the last 168 hours Lipids Lab  Results  Component Value Date   CHOL 175 08/22/2010   HDL 51.50 08/22/2010   LDLCALC 95 08/22/2010   TRIG 142.0 08/22/2010   BNP No results found for this basename: probnp   Miscellaneous No results found for this basename: DDIMER    Radiology/Studies:  No results found.  EKG: Sinus rhythm with left bundle branch block ECG 11/14 demonstrated 3 PVCs. Prior ECGs did not demonstrate any ventricular ectopy  Assessment and Plan:    Nonischemic cardiac myopathy  Left bundle branch block  Congestive heart failure-chronic-systolic-class III  Sinus bradycardia  PVCs  Dizziness  Depression  The patient presented in September with acute systolic heart failure and was found to have severe depression of LVEF and normal coronary arteries. This however had occurred in the context of a 2-3 year history of gradually worsening exercise tolerance.  She also has sinus bradycardia which may be partly related to her carvedilol and dizziness which may related to her Lotensin, a lot of which we will cut in half.  Her ejection fraction remains depressed not withstanding guideline directed medical therapy is initiated appropriately considered for CRT. Nonischemic female, she is likely to respond more so than other cohorts. HCT ablation does not occur with CRT.  We also had a lengthy discussion regarding ICD therapy. At the time of device  implantation she would like to proceed with ICD CRT.  Her bradycardia may be further compromise exercise tolerance and so we will follow this along following device implantation. The presence of PVCs on one ECG was not  supported by others or on auscultation today so it is unlikely that she has a PVC cardiomyopathy.  Have reviewed the potential benefits and risks of ICD implantation including but not limited to death, perforation of heart or lung, lead dislodgement, infection,  device malfunction and inappropriate shocks.  The patient and family express understanding  and are willing to proceed.    In addition, this has been very distressing to her heart and soul;   we will begin her on Celexa at 20 mg. We have reviewed the potential for side effects.  Virl Axe

## 2013-02-09 ENCOUNTER — Encounter (HOSPITAL_COMMUNITY): Payer: Self-pay

## 2013-02-17 ENCOUNTER — Other Ambulatory Visit (INDEPENDENT_AMBULATORY_CARE_PROVIDER_SITE_OTHER): Payer: Medicare HMO

## 2013-02-17 DIAGNOSIS — I5023 Acute on chronic systolic (congestive) heart failure: Secondary | ICD-10-CM

## 2013-02-17 DIAGNOSIS — Z01812 Encounter for preprocedural laboratory examination: Secondary | ICD-10-CM

## 2013-02-17 DIAGNOSIS — I509 Heart failure, unspecified: Secondary | ICD-10-CM

## 2013-02-17 LAB — BASIC METABOLIC PANEL
BUN: 22 mg/dL (ref 6–23)
CO2: 30 mEq/L (ref 19–32)
Calcium: 9.4 mg/dL (ref 8.4–10.5)
Chloride: 106 mEq/L (ref 96–112)
Creatinine, Ser: 1 mg/dL (ref 0.4–1.2)
GFR: 58.22 mL/min — AB (ref >60.00)
Glucose, Bld: 91 mg/dL (ref 70–99)
Potassium: 4.3 mEq/L (ref 3.5–5.1)
SODIUM: 143 meq/L (ref 135–145)

## 2013-02-17 LAB — CBC WITH DIFFERENTIAL/PLATELET
BASOS PCT: 0.5 % (ref 0.0–3.0)
Basophils Absolute: 0 10*3/uL (ref 0.0–0.1)
Eosinophils Absolute: 0 10*3/uL (ref 0.0–0.7)
Eosinophils Relative: 0.8 % (ref 0.0–5.0)
HCT: 35.7 % — ABNORMAL LOW (ref 36.0–46.0)
Hemoglobin: 12.4 g/dL (ref 12.0–15.0)
LYMPHS ABS: 1.4 10*3/uL (ref 0.7–4.0)
Lymphocytes Relative: 35.2 % (ref 12.0–46.0)
MCHC: 34.6 g/dL (ref 30.0–36.0)
MCV: 90.5 fl (ref 78.0–100.0)
Monocytes Absolute: 0.4 10*3/uL (ref 0.1–1.0)
Monocytes Relative: 9.7 % (ref 3.0–12.0)
Neutro Abs: 2.2 10*3/uL (ref 1.4–7.7)
Neutrophils Relative %: 53.8 % (ref 43.0–77.0)
PLATELETS: 173 10*3/uL (ref 150.0–400.0)
RBC: 3.94 Mil/uL (ref 3.87–5.11)
RDW: 13.7 % (ref 11.5–14.6)
WBC: 4.1 10*3/uL — ABNORMAL LOW (ref 4.5–10.5)

## 2013-02-21 MED ORDER — CEFAZOLIN SODIUM-DEXTROSE 2-3 GM-% IV SOLR
2.0000 g | INTRAVENOUS | Status: DC
  Filled 2013-02-21: qty 50

## 2013-02-21 MED ORDER — CHLORHEXIDINE GLUCONATE 4 % EX LIQD
60.0000 mL | Freq: Once | CUTANEOUS | Status: DC
  Filled 2013-02-21: qty 60

## 2013-02-21 MED ORDER — SODIUM CHLORIDE 0.9 % IR SOLN
80.0000 mg | Status: DC
  Filled 2013-02-21: qty 2

## 2013-02-22 ENCOUNTER — Ambulatory Visit (HOSPITAL_COMMUNITY)
Admission: RE | Admit: 2013-02-22 | Discharge: 2013-02-23 | Disposition: A | Discharge status: Home / Self Care | Payer: Medicare HMO | Source: Ambulatory Visit | Attending: Internal Medicine | Admitting: Internal Medicine

## 2013-02-22 ENCOUNTER — Encounter (HOSPITAL_COMMUNITY): Payer: Self-pay | Admitting: General Practice

## 2013-02-22 ENCOUNTER — Encounter (HOSPITAL_COMMUNITY)
Admission: RE | Disposition: A | Discharge status: Home / Self Care | Payer: Self-pay | Source: Ambulatory Visit | Attending: Internal Medicine

## 2013-02-22 DIAGNOSIS — I447 Left bundle-branch block, unspecified: Secondary | ICD-10-CM

## 2013-02-22 DIAGNOSIS — R072 Precordial pain: Secondary | ICD-10-CM

## 2013-02-22 DIAGNOSIS — I509 Heart failure, unspecified: Secondary | ICD-10-CM

## 2013-02-22 DIAGNOSIS — I428 Other cardiomyopathies: Secondary | ICD-10-CM

## 2013-02-22 DIAGNOSIS — K219 Gastro-esophageal reflux disease without esophagitis: Secondary | ICD-10-CM | POA: Insufficient documentation

## 2013-02-22 DIAGNOSIS — R001 Bradycardia, unspecified: Secondary | ICD-10-CM

## 2013-02-22 DIAGNOSIS — I493 Ventricular premature depolarization: Secondary | ICD-10-CM

## 2013-02-22 DIAGNOSIS — I10 Essential (primary) hypertension: Secondary | ICD-10-CM

## 2013-02-22 DIAGNOSIS — E039 Hypothyroidism, unspecified: Secondary | ICD-10-CM | POA: Insufficient documentation

## 2013-02-22 DIAGNOSIS — I5022 Chronic systolic (congestive) heart failure: Secondary | ICD-10-CM

## 2013-02-22 DIAGNOSIS — E785 Hyperlipidemia, unspecified: Secondary | ICD-10-CM

## 2013-02-22 DIAGNOSIS — I498 Other specified cardiac arrhythmias: Secondary | ICD-10-CM | POA: Insufficient documentation

## 2013-02-22 DIAGNOSIS — M4800 Spinal stenosis, site unspecified: Secondary | ICD-10-CM | POA: Insufficient documentation

## 2013-02-22 HISTORY — PX: BI-VENTRICULAR IMPLANTABLE CARDIOVERTER DEFIBRILLATOR: SHX5459

## 2013-02-22 HISTORY — DX: Heart failure, unspecified: I50.9

## 2013-02-22 LAB — SURGICAL PCR SCREEN
MRSA, PCR: NEGATIVE
Staphylococcus aureus: NEGATIVE

## 2013-02-22 SURGERY — BI-VENTRICULAR IMPLANTABLE CARDIOVERTER DEFIBRILLATOR  (CRT-D)
Anesthesia: LOCAL

## 2013-02-22 MED ORDER — MUPIROCIN 2 % EX OINT
TOPICAL_OINTMENT | CUTANEOUS | Status: AC
  Administered 2013-02-22: 10:00:00
  Filled 2013-02-22: qty 22

## 2013-02-22 MED ORDER — TRAMADOL HCL 50 MG PO TABS
50.0000 mg | ORAL_TABLET | Freq: Two times a day (BID) | ORAL | Status: DC | PRN
  Administered 2013-02-22 – 2013-02-23 (×2): 50 mg via ORAL
  Filled 2013-02-22 (×2): qty 1

## 2013-02-22 MED ORDER — SODIUM CHLORIDE 0.9 % IV SOLN
INTRAVENOUS | Status: DC
  Administered 2013-02-22: 50 mL/h via INTRAVENOUS

## 2013-02-22 MED ORDER — ONDANSETRON HCL 4 MG/2ML IJ SOLN: 4.0000 mg | Freq: Four times a day (QID) | INTRAMUSCULAR | Status: DC | PRN

## 2013-02-22 MED ORDER — DIAZEPAM 5 MG PO TABS
ORAL_TABLET | ORAL | Status: AC
  Administered 2013-02-22: 5 mg via OROMUCOSAL
  Filled 2013-02-22: qty 1

## 2013-02-22 MED ORDER — CITALOPRAM HYDROBROMIDE 10 MG PO TABS
10.0000 mg | ORAL_TABLET | Freq: Every day | ORAL | Status: DC
  Administered 2013-02-22 – 2013-02-23 (×2): 10 mg via ORAL
  Filled 2013-02-22 (×2): qty 1

## 2013-02-22 MED ORDER — LORATADINE 10 MG PO TABS
10.0000 mg | ORAL_TABLET | Freq: Once | ORAL | Status: AC
  Administered 2013-02-22: 22:00:00 10 mg via ORAL
  Filled 2013-02-22: qty 1

## 2013-02-22 MED ORDER — FENTANYL CITRATE 0.05 MG/ML IJ SOLN
INTRAMUSCULAR | Status: AC
  Filled 2013-02-22: qty 2

## 2013-02-22 MED ORDER — VITAMIN D3 25 MCG (1000 UNIT) PO TABS
1000.0000 [IU] | ORAL_TABLET | Freq: Every day | ORAL | Status: DC
  Administered 2013-02-22 – 2013-02-23 (×2): 1000 [IU] via ORAL
  Filled 2013-02-22 (×2): qty 1

## 2013-02-22 MED ORDER — LEVOTHYROXINE SODIUM 50 MCG PO TABS
50.0000 ug | ORAL_TABLET | Freq: Every day | ORAL | Status: DC
  Administered 2013-02-22 – 2013-02-23 (×2): 50 ug via ORAL
  Filled 2013-02-22 (×2): qty 1

## 2013-02-22 MED ORDER — MIDAZOLAM HCL 5 MG/5ML IJ SOLN
INTRAMUSCULAR | Status: AC
  Filled 2013-02-22: qty 5

## 2013-02-22 MED ORDER — ACETAMINOPHEN 325 MG PO TABS
325.0000 mg | ORAL_TABLET | ORAL | Status: DC | PRN
Start: 2013-02-22 — End: 2013-02-23
  Administered 2013-02-22: 650 mg via ORAL
  Filled 2013-02-22: qty 2

## 2013-02-22 MED ORDER — SODIUM CHLORIDE 0.9 % IV SOLN: INTRAVENOUS | Status: AC

## 2013-02-22 MED ORDER — VITAMIN E 180 MG (400 UNIT) PO CAPS
400.0000 [IU] | ORAL_CAPSULE | Freq: Every day | ORAL | Status: DC
  Administered 2013-02-22 – 2013-02-23 (×2): 400 [IU] via ORAL
  Filled 2013-02-22 (×2): qty 1

## 2013-02-22 MED ORDER — ADULT MULTIVITAMIN W/MINERALS CH
1.0000 | ORAL_TABLET | Freq: Every day | ORAL | Status: DC
  Administered 2013-02-22 – 2013-02-23 (×2): 1 via ORAL
  Filled 2013-02-22 (×2): qty 1

## 2013-02-22 MED ORDER — DIAZEPAM 5 MG PO TABS: 10.0000 mg | ORAL_TABLET | Freq: Once | ORAL | Status: DC

## 2013-02-22 MED ORDER — LISINOPRIL 20 MG PO TABS
20.0000 mg | ORAL_TABLET | Freq: Every day | ORAL | Status: DC
  Filled 2013-02-22 (×2): qty 1

## 2013-02-22 MED ORDER — SPIRONOLACTONE 12.5 MG HALF TABLET
12.5000 mg | ORAL_TABLET | Freq: Every day | ORAL | Status: DC
  Administered 2013-02-22 – 2013-02-23 (×2): 12.5 mg via ORAL
  Filled 2013-02-22 (×2): qty 1

## 2013-02-22 MED ORDER — DIAZEPAM 5 MG PO TABS
ORAL_TABLET | ORAL | Status: AC
  Administered 2013-02-22: 5 mg
  Filled 2013-02-22: qty 1

## 2013-02-22 MED ORDER — MUPIROCIN 2 % EX OINT
TOPICAL_OINTMENT | Freq: Once | CUTANEOUS | Status: AC
  Administered 2013-02-22: 10:00:00 via NASAL

## 2013-02-22 MED ORDER — MUPIROCIN 2 % EX OINT: TOPICAL_OINTMENT | Freq: Two times a day (BID) | CUTANEOUS | Status: DC

## 2013-02-22 MED ORDER — LIDOCAINE HCL (PF) 1 % IJ SOLN
INTRAMUSCULAR | Status: AC
  Filled 2013-02-22: qty 60

## 2013-02-22 MED ORDER — YOU HAVE A PACEMAKER BOOK
Freq: Once | Status: AC
  Administered 2013-02-22: 22:00:00
  Filled 2013-02-22: qty 1

## 2013-02-22 MED ORDER — PANTOPRAZOLE SODIUM 40 MG PO TBEC
40.0000 mg | DELAYED_RELEASE_TABLET | Freq: Every day | ORAL | Status: DC
  Administered 2013-02-22 – 2013-02-23 (×2): 40 mg via ORAL
  Filled 2013-02-22 (×2): qty 1

## 2013-02-22 MED ORDER — CARVEDILOL 6.25 MG PO TABS
6.2500 mg | ORAL_TABLET | Freq: Two times a day (BID) | ORAL | Status: DC
  Administered 2013-02-22 – 2013-02-23 (×2): 6.25 mg via ORAL
  Filled 2013-02-22 (×4): qty 1

## 2013-02-22 MED ORDER — CEFAZOLIN SODIUM 1-5 GM-% IV SOLN
1.0000 g | Freq: Four times a day (QID) | INTRAVENOUS | Status: AC
  Administered 2013-02-22 – 2013-02-23 (×3): 1 g via INTRAVENOUS
  Filled 2013-02-22 (×3): qty 50

## 2013-02-22 NOTE — Discharge Summary (Signed)
ELECTROPHYSIOLOGY PROCEDURE DISCHARGE SUMMARY    Patient ID: Heather Smith,  MRN: [id number], DOB/AGE: March 08, 1932 80 y.o.  Admit date: 02/22/2013 Discharge date: 02/23/2013  Primary Care Physician: Shirline Frees, MD Primary Cardiologist: Acie Fredrickson, MD Electrophysiologist: Caryl Comes, MD  Primary Discharge Diagnosis:  Non ischemic cardiomyopathy, congestive heart failure and LBBB status post CRT-D implantation  Secondary Discharge Diagnoses:  1.  Hypertension 2.  Hyperlipidemia 3.  Hypothyroidism 4.  GERD 5.  Spinal stenosis 6.  Sinus bradycardia  Procedures This Admission:  1.  Implantation of a Medtronic CRT-D on 02-22-2013 by Dr Caryl Comes.  RA lead - Medtronic 5076 active fixation atrial lead, serial number [id number]  RV lead - Medtronic [id number] single coil active fixation defibrillator lead, serial number [id number]  LV lead - Medtronic model number [id number], serial number [id number] Device - Medtronic Viva ICD, serial number [id number]  Brief HPI: Heather Smith is a 80 y.o. female was referred to electrophysiology in the outpatient setting for consideration of ICD implantation.  Past medical history includes non ischemic cardiomyopathy, congestive heart failure, LBBB, and sinus bradycardia.  The patient has persistent LV dysfunction despite guideline directed therapy.  Risks, benefits, and alternatives to CRTD implantation were reviewed with the patient who wished to proceed.   Hospital Course:  Heather Smith was admitted and underwent implantation of a Medtronic CRTD with details as outlined above.   She was monitored on telemetry overnight which demonstrated A sensed V paced rhythm without arrhythmias.  Left chest / implant site is intact without hematoma or ecchymosis.  The device was interrogated and found to be functioning normally.  CXR was obtained and demonstrated no pneumothorax status post device implantation.  Wound care, arm mobility, and restrictions were reviewed with  the patient.  Dr. Caryl Comes examined the patient and considered them stable for discharge to home today. She will follow-up in 10 days for wound check. She will see Dr. Acie Fredrickson in 6 weeks for HF follow-up and BMET. She will see Dr. Caryl Comes in 3 months. Of note, her discharge medications include an ACE-I (Lisinopril) and beta blocker (Coreg).   Discharge Vitals: Blood pressure 142/65, pulse 61, temperature 98 F (36.7 C), temperature source Oral, resp. rate 18, height 5\' 7"  (1.702 m), weight 183 lb 6.8 oz (83.2 kg), SpO2 95.00%.    Labs: Lab Results  Component Value Date   WBC 4.1* 02/17/2013   HGB 12.4 02/17/2013   HCT 35.7* 02/17/2013   MCV 90.5 02/17/2013   PLT 173.0 02/17/2013     Recent Labs Lab 02/17/13 0959  NA 143  K 4.3  CL 106  CO2 30  BUN 22  CREATININE 1.0  CALCIUM 9.4  GLUCOSE 91    Discharge Medications:    Medication List    STOP taking these medications       Magnesium 250 MG Tabs      TAKE these medications       BIOTIN 5000 5 MG Caps  Generic drug:  Biotin  Take 5,000 mg by mouth daily.     carvedilol 6.25 MG tablet  Commonly known as:  COREG  Take 1 tablet (6.25 mg total) by mouth 2 (two) times daily with a meal.     cholecalciferol 1000 UNITS tablet  Commonly known as:  VITAMIN D  Take 1,000 Units by mouth daily.     citalopram 10 MG tablet  Commonly known as:  CELEXA  Take 1 tablet (10 mg total) by mouth daily.  levothyroxine 50 MCG tablet  Commonly known as:  SYNTHROID, LEVOTHROID  Take 50 mcg by mouth daily.     lisinopril 20 MG tablet  Commonly known as:  PRINIVIL,ZESTRIL  Take 1 tablet (20 mg total) by mouth at bedtime.     multivitamin per tablet  Take 1 tablet by mouth daily.     omeprazole 20 MG capsule  Commonly known as:  PRILOSEC  Take 40 mg by mouth daily.     spironolactone 25 MG tablet  Commonly known as:  ALDACTONE  Take 0.5 tablets (12.5 mg total) by mouth daily.     traMADol 50 MG tablet  Commonly known as:  ULTRAM    Take 1 tablet by mouth every 12 (twelve) hours as needed for moderate pain.     vitamin C 1000 MG tablet  Take 1,000 mg by mouth at bedtime.     vitamin E 400 UNIT capsule  Take 400 Units by mouth daily.       Disposition:  Discharge Orders   Future Appointments Provider Department Dept Phone   03/02/2013 10:00 AM [facility] [place] Office [phone]   04/20/2013 9:30 AM Thayer Headings, MD [facility] [phone]   Future Orders Complete By Expires   Diet - low sodium heart healthy  As directed    Increase activity slowly  As directed      Follow-up Information   Follow up with [facility] On 03/02/2013. (At 10:00 AM for wound check)    Specialty:  Cardiology   Contact information:   [address], [place] [address] [phone]      Follow up with Darden Amber., MD On 04/20/2013. (At 9:30 AM )    Specialty:  Cardiology   Contact information:   [address] [place] [address] [phone]       Follow up with Virl Axe, MD In 3 months. (Our office will contact you with your appointment date and time)    Specialty:  Cardiology   Contact information:   [address] [place] [address] [phone]      Duration of Discharge Encounter: Greater than 30 minutes including physician time.  Signed,

## 2013-02-22 NOTE — Interval H&P Note (Signed)
ICD Criteria  Current LVEF (within 6 months):15-20%  NYHA Functional Classification: Class III  Heart Failure History:  Yes, Duration of heart failure since onset is >9 months  Non-Ischemic Dilated Cardiomyopathy History:  Yes, timeframe is 3 to 9 months  Atrial Fibrillation/Atrial Flutter:  No.  Ventricular Tachycardia History:  No.  Cardiac Arrest History:  No  History of Syndromes with Risk of Sudden Death:  No.  Previous ICD:  No.  Electrophysiology Study: No.  Prior MI: No.  PPM: No.  OSA:  No  Patient Life Expectancy of >=1 year: Yes.  Anticoagulation Therapy:  Patient is NOT on anticoagulation therapy.   Beta Blocker Therapy:  Yes.   Ace Inhibitor/ARB Therapy:  Yes.History and Physical Interval Note:  02/22/2013 9:51 AM  Heather Smith  has presented today for surgery, with the diagnosis of Cardio myopathy  The various methods of treatment have been discussed with the patient and family. After consideration of risks, benefits and other options for treatment, the patient has consented to  Procedure(s): BI-VENTRICULAR IMPLANTABLE CARDIOVERTER DEFIBRILLATOR  (CRT-D) (N/A) as a surgical intervention .  The patient's history has been reviewed, patient examined, no change in status, stable for surgery.  I have reviewed the patient's chart and labs.  Questions were answered to the patient's satisfaction.     Virl Axe

## 2013-02-22 NOTE — H&P (View-Only) (Signed)
ELECTROPHYSIOLOGY CONSULT NOTE  Patient ID: Heather Smith, MRN: [id number], DOB/AGE: [id number] 79 y.o. Admit date: (Not on file) Date of Consult: 02/08/2013  Primary Physician: Shirline Frees, MD Primary Cardiologist [facility]  Chief Complaint:  ?  CRT   HPI Heather Smith is a 80 y.o. female  Referred for consideration of CRT. She is 80 year old woman with a history of nonischemic heart disease confirmed by catheterization 9/14. She presented with chest pain and dyspnea. Ejection fraction was found to be 25-35% Repeat echo 12/14 showed ejection fraction of 20-25% despite guidelines directed medical therapy with carvedilol lisinopril and spironolactone.  She continues to struggle with fatigue and exercise intolerance manifessted by DOE < 100yds and stairs  No edema or PND, no palpitations or syncope       Past Medical History  Diagnosis Date  . Hypertension   . Hypercholesterolemia   . Left bundle branch block   . Hypothyroidism   . Osteoarthritis   . Spinal stenosis   . GERD (gastroesophageal reflux disease)   . Vitamin D deficiency   . Neurogenic claudication due to lumbar spinal stenosis   . Chest pain     a. Nl cath in the 90's;  b. 04/2005: Myoview: subtle areao of ? reversibility in apical segment of anteromedial LV - ? attenuation, EF 54%;  c. 05/2012 MV: small, fixed mild apical septal perfusion defect, ? attenuation due to lbbb, no ischemia.  . Non-ischemic cardiomyopathy     LHC 10/04/12: Normal coronary arteries, EF 25-30% with global HK.  Echocardiogram 10/05/12: EF 30-35%, diffuse HK, moderate LAE. Echocardiogram (01/2013): Lateral to septal wall dyssynchrony EF 20-25%, diffuse HK  . Chronic systolic heart failure       Surgical History:  Past Surgical History  Procedure Laterality Date  . Tubal ligation    . Foot arthrodesis, modified mcbride    . Knee arthroscopy      left  . Knee arthroscopy      right  . Cardiac catheterization  2000   . Ptca    . Lumbar laminectomy  11/03/2006     Bilateral 3, 4, 5 laminectomy, partial L2, decompression of the thecal sac, foraminotomy, posterolateral arthrodesis L3-S1 with autograft   -- SURGEON:  Leeroy Cha, M.D.      Home Meds: Prior to Admission medications   Medication Sig Start Date End Date Taking? Authorizing Provider  Ascorbic Acid (VITAMIN C) 1000 MG tablet Take 1,000 mg by mouth at bedtime.    Yes Historical Provider, MD  Biotin (BIOTIN 5000) 5 MG CAPS Take 5,000 mg by mouth daily.   Yes Historical Provider, MD  carvedilol (COREG) 6.25 MG tablet Take 1 tablet (6.25 mg total) by mouth 2 (two) times daily with a meal. 10/05/12  Yes Rhonda G Barrett, PA-C  cholecalciferol (VITAMIN D) 1000 UNITS tablet Take 1,000 Units by mouth daily.     Yes Historical Provider, MD  levothyroxine (SYNTHROID, LEVOTHROID) 50 MCG tablet Take 50 mcg by mouth daily.     Yes Historical Provider, MD  lisinopril (PRINIVIL,ZESTRIL) 20 MG tablet Take 1 tablet (20 mg total) by mouth 2 (two) times daily. 12/15/12  Yes Thayer Headings, MD  Magnesium 250 MG TABS Take 250 mg by mouth daily.    Yes Historical Provider, MD  multivitamin [facility]) per tablet Take 1 tablet by mouth daily.     Yes Historical Provider, MD  omeprazole (PRILOSEC) 20 MG capsule Take 40 mg by mouth daily.  Yes Historical Provider, MD  spironolactone (ALDACTONE) 25 MG tablet Take 0.5 tablets (12.5 mg total) by mouth daily. 10/05/12  Yes Rhonda G Barrett, PA-C  traMADol (ULTRAM) 50 MG tablet Take 1 tablet by mouth as needed. 10/06/12  Yes Historical Provider, MD  vitamin E 400 UNIT capsule Take 400 Units by mouth daily.   Yes Historical Provider, MD    Inpatient Medications:     Allergies:  Allergies  Allergen Reactions  . Niaspan [Niacin Er]     High doses, causes hot flashes & aching   . Statins Other (See Comments)    "Makes her bones hurt and very sore"  . Benadryl [Diphenhydramine Hcl] Other (See Comments)    Very jittery     History   Social History  . Marital Status: Married    Spouse Name: N/A    Number of Children: N/A  . Years of Education: N/A   Occupational History  . Not on file.   Social History Main Topics  . Smoking status: Never Smoker   . Smokeless tobacco: Not on file  . Alcohol Use: No  . Drug Use: No  . Sexual Activity: Not on file   Other Topics Concern  . Not on file   Social History Narrative   Lives in [place], [place] with husband.  Active around the house.     Family History  Problem Relation Age of Onset  . Heart attack Mother     died @ 78.  . Thyroid disease Mother   . Lung cancer Mother   . Hyperlipidemia Mother   . Aneurysm Father     died @ 82.  . Aneurysm Brother     has had an Aneurysm x3  . Colon cancer Brother   . Bone cancer Brother   . Lung cancer Brother   . Stomach cancer Brother      ROS:  Please see the history of present illness.    All other systems reviewed and negative.    Physical Exam: Blood pressure 125/71, pulse 59, height 5\' 7"  (1.702 m), weight 177 lb 12.8 oz (80.65 kg). General: Well developed, well nourished female in no acute distress. Head: Normocephalic, atraumatic, sclera non-icteric, no xanthomas, nares are without discharge. EENT: normal Lymph Nodes:  none Back: without scoliosis/kyphosis, no CVA tendersness Neck: Negative for carotid bruits. JVD not elevated. Lungs: Clear bilaterally to auscultation without wheezes, rales, or rhonchi. Breathing is unlabored. Heart: RRR with S1 S2. PMI displaced Abdomen: Soft, non-tender, non-distended with normoactive bowel sounds. No hepatomegaly. No rebound/guarding. No obvious abdominal masses. Msk:  Strength and tone appear normal for age. Extremities: No clubbing or cyanosis. No edema.  Distal pedal pulses are 2+ and equal bilaterally. Skin: Warm and Dry Neuro: Alert and oriented X 3. CN III-XII intact Grossly normal sensory and motor function . Psych:  Responds to questions  appropriately with a sad and tearful affect.      Labs: Cardiac Enzymes No results found for this basename: CKTOTAL, CKMB, TROPONINI,  in the last 72 hours CBC Lab Results  Component Value Date   WBC 4.4 10/04/2012   HGB 13.7 10/04/2012   HCT 38.5 10/04/2012   MCV 87.7 10/04/2012   PLT 146* 10/04/2012   PROTIME: No results found for this basename: LABPROT, INR,  in the last 72 hours Chemistry No results found for this basename: NA, K, CL, CO2, BUN, CREATININE, CALCIUM, LABALBU, PROT, BILITOT, ALKPHOS, ALT, AST, GLUCOSE,  in the last 168 hours Lipids Lab  Results  Component Value Date   CHOL 175 08/22/2010   HDL 51.50 08/22/2010   LDLCALC 95 08/22/2010   TRIG 142.0 08/22/2010   BNP No results found for this basename: probnp   Miscellaneous No results found for this basename: DDIMER    Radiology/Studies:  No results found.  EKG: Sinus rhythm with left bundle branch block ECG 11/14 demonstrated 3 PVCs. Prior ECGs did not demonstrate any ventricular ectopy  Assessment and Plan:    Nonischemic cardiac myopathy  Left bundle branch block  Congestive heart failure-chronic-systolic-class III  Sinus bradycardia  PVCs  Dizziness  Depression  The patient presented in September with acute systolic heart failure and was found to have severe depression of LVEF and normal coronary arteries. This however had occurred in the context of a 2-3 year history of gradually worsening exercise tolerance.  She also has sinus bradycardia which may be partly related to her carvedilol and dizziness which may related to her Lotensin, a lot of which we will cut in half.  Her ejection fraction remains depressed not withstanding guideline directed medical therapy is initiated appropriately considered for CRT. Nonischemic female, she is likely to respond more so than other cohorts. HCT ablation does not occur with CRT.  We also had a lengthy discussion regarding ICD therapy. At the time of device  implantation she would like to proceed with ICD CRT.  Her bradycardia may be further compromise exercise tolerance and so we will follow this along following device implantation. The presence of PVCs on one ECG was not  supported by others or on auscultation today so it is unlikely that she has a PVC cardiomyopathy.  Have reviewed the potential benefits and risks of ICD implantation including but not limited to death, perforation of heart or lung, lead dislodgement, infection,  device malfunction and inappropriate shocks.  The patient and family express understanding  and are willing to proceed.    In addition, this has been very distressing to her heart and soul;   we will begin her on Celexa at 20 mg. We have reviewed the potential for side effects.  Virl Axe

## 2013-02-22 NOTE — CV Procedure (Signed)
Heather Smith [id number]  [id number]  Preop YW:VPXT LBBB CHF Postop Dx same/   Cx: none apparent    Procedure: dual  chamber ICD implantation with LV lead palacment and HV lead testin  Following the obtaining of informed consent the patient was brought to the electrophysiology laboratory in place of the fluoroscopic table in the supine position.  After routine prep and drape, lidocaine was infiltrated in the prepectoral subclavicular region and an incision was made and carried down to the layer of the prepectoral fascia using electrocautery and sharp dissection. A pocket was formed similarly.  Thereafter  attention was turned to gaining access to the extrathoracic left subclavian vein which was accomplished without  difficulty and without the aspiration of air or puncture of the artery.Three separate venipunctures were accomplished  Sequentially  A 9 French sheath (.5 sheath And 77F sheath were placed through which were   passed a  singlel coil   active fixation defibrillator lead, model Medtronic 0626 serial number  [id number] , A MEDTRONIC LV MB2 delivery system  and a Medtronic 5076 active fixation atrial lead, serial number [id number] .  The RV lead was   passed under fluoroscopic guidance to the right ventricular apex     In its location the bipolar R wave was 6.4 millivolts, impedance was 682 ohms, the pacing threshold was 0.6 volts at 0.5 msec. Current at threshold was 1.0 mA.  There was no diaphragmatic pacing at 10 V. The current of injury was brisk .  TheCS was cannulated without difficulty but the high lateral branch, the only vein had about 180- degree take off requiring the use of a subselection sheath  In its location the bipolar Lwave was 18.3 millivolts, impedance was 1082 ohms, the pacing threshold was 0.9 volts at 0.5 msec. Current at threshold was 1.0 mA.  There was no diaphragmatic pacing at 10 V.    THE DELIVERY SYSTEM WAS REMOVED WITHOUT difficulty    The bipolar P wave  was 2.2 millivolts, impedance was 781 ohms, the pacing threshold was 1.2 volts at 0.5 msec. Current at threshold was 1.8 mA.  There was no diaphragmatic pacing at 10 V. The current of injury was brisk .   The leads were secured to the prepectoral fascia and then attached to a [employer] [place] ICD, serial number  [id number].  Through the device, the bipolar R wave was 10 millivolts, impedance was 722 ohms, the pacing threshold was 0.5 volts at 0.4 msec.  The bipolar P wave was 1.6 millivolts, impedance was 513 ohms, the pacing threshold was 0.75 volts at 0.4 msec. High-voltage impedance was  74 ohms. Through the device, the bipolar LV lead   impedance was 570 ohms, the pacing threshold was 1.75 volts at 0.4 msec.       The pocket was copiously irrigated with antibiotic containing saline solution. Hemostasis was assured, and the device and the leads were placed in the pocket and secured to the prepectoral fascia.  The wound was closed in 2 layers in normal fashion. The wound was washed dried and a Dermabonc dressing was then applied. Needle counts, sponge counts and instrument counts were correct at the end of the procedure according to the staff.

## 2013-02-23 ENCOUNTER — Ambulatory Visit (HOSPITAL_COMMUNITY): Payer: Medicare HMO

## 2013-02-23 DIAGNOSIS — I428 Other cardiomyopathies: Secondary | ICD-10-CM

## 2013-02-23 NOTE — Discharge Instructions (Signed)
° °  Supplemental Discharge Instructions for  Pacemaker/Defibrillator Patients  Activity No heavy lifting or vigorous activity with your left/right arm for 6 to 8 weeks.  Do not raise your left/right arm above your head for one week.  Gradually raise your affected arm as drawn below.           01/24                      01/25                       01/26                      01/27       NO DRIVING for 1 week; you may begin driving on 11/94/1740. WOUND CARE   Keep the wound area clean and dry. You may shower but no tub baths, swimming pool or hot tub for 7-10 days until wound completely healed.    The Dermabond (glue) on your wound will fall off on its own; do not pull it off.  No bandage is needed on the site.  DO  NOT apply any creams, oils, or ointments to the wound area.   If you notice any drainage or discharge from the wound, any swelling or bruising at the site, or you develop a fever > 101? F after you are discharged home, call the office at once.  Special Instructions   You are still able to use cellular telephones; use the ear opposite the side where you have your pacemaker/defibrillator.  Avoid carrying your cellular phone near your device.   When traveling through airports, show security personnel your identification card to avoid being screened in the metal detectors.  Ask the security personnel to use the hand wand.   Avoid arc welding equipment, MRI testing (magnetic resonance imaging), TENS units (transcutaneous nerve stimulators).  Call the office for questions about other devices.   Avoid electrical appliances that are in poor condition or are not properly grounded.   Microwave ovens are safe to be near or to operate.  Additional information for defibrillator patients should your device go off:   If your device goes off ONCE and you feel fine afterward, notify the device clinic nurses.   If your device goes off ONCE and you do not feel well afterward, call 911.   If your  device goes off TWICE, call 911.   If your device goes off THREE times in one day, call 911.  DO NOT DRIVE YOURSELF OR A FAMILY MEMBER WITH A DEFIBRILLATOR TO THE HOSPITAL--CALL 911.

## 2013-02-23 NOTE — Progress Notes (Signed)
Also complaining of runny nose and eyes  ? Allergic  Reviewed instructions

## 2013-02-23 NOTE — Progress Notes (Signed)
    Patient: Heather Smith Date of Encounter: 02/23/2013, 6:55 AM Admit date: 02/22/2013     Subjective  Heather Smith reports mild soreness at implant site but is otherwise without complaint. She is eager to go home.   Objective  Physical Exam: Vitals: BP 142/66  Pulse 64  Temp(Src) 98.1 F (36.7 C) (Oral)  Resp 20  Ht 5\' 7"  (1.702 m)  Wt 183 lb 6.8 oz (83.2 kg)  BMI 28.72 kg/m2  SpO2 97% General: Well developed, well appearing 80 year old female in no acute distress. Neck: Supple. JVD not elevated. Lungs: Clear bilaterally to auscultation without wheezes, rales, or rhonchi. Breathing is unlabored. Heart: RRR S1 S2 without murmurs, rubs, or gallops.  Abdomen: Soft, non-distended. Extremities: No clubbing or cyanosis. No edema.  Distal pedal pulses are 2+ and equal bilaterally. Neuro: Alert and oriented X 3. Moves all extremities spontaneously. No focal deficits. Skin: Left upper chest / implant site intact without bleeding or hematoma.  Intake/Output:  Intake/Output Summary (Last 24 hours) at 02/23/13 0655 Last data filed at 02/23/13 0500  Gross per 24 hour  Intake    170 ml  Output   1500 ml  Net  -1330 ml    Inpatient Medications:  . carvedilol  6.25 mg Oral BID WC  .  ceFAZolin (ANCEF) IV  1 g Intravenous Q6H  . cholecalciferol  1,000 Units Oral Daily  . citalopram  10 mg Oral Daily  . diazepam  10 mg Oral Once  . levothyroxine  50 mcg Oral Daily  . lisinopril  20 mg Oral QHS  . multivitamin with minerals  1 tablet Oral Daily  . pantoprazole  40 mg Oral Daily  . spironolactone  12.5 mg Oral Daily  . vitamin E  400 Units Oral Daily    Labs:    Component Value Date/Time   WBC 4.1* 02/17/2013 0959   RBC 3.94 02/17/2013 0959   HGB 12.4 02/17/2013 0959   HCT 35.7* 02/17/2013 0959   PLT 173.0 02/17/2013 0959   MCV 90.5 02/17/2013 0959   MCH 31.2 10/04/2012 1830   MCHC 34.6 02/17/2013 0959   RDW 13.7 02/17/2013 0959   LYMPHSABS 1.4 02/17/2013 0959   MONOABS 0.4  02/17/2013 0959   EOSABS 0.0 02/17/2013 0959   BASOSABS 0.0 02/17/2013 0959      Component Value Date/Time   NA 143 02/17/2013 0959   K 4.3 02/17/2013 0959   CL 106 02/17/2013 0959   CO2 30 02/17/2013 0959   GLUCOSE 91 02/17/2013 0959   BUN 22 02/17/2013 0959   CREATININE 1.0 02/17/2013 0959   CALCIUM 9.4 02/17/2013 0959   GFRNONAA 60* 10/05/2012 1055   GFRAA 70* 10/05/2012 1055    Radiology/Studies: Chest x-ray done - images and report pending Device interrogation: pending Telemetry: A sensed V paced; no arrhythmias   Assessment and Plan  1. NICM with chronic systolic HF and LBBB, now s/p CRT-D implant Heather Smith is doing well post implant. Wound is intact without bleeding or hematoma. Chest x-ray and device interrogation pending. Reviewed wound care, activity restrictions and follow-up with her. Plan for home this AM if chest x-ray and device interrogation are normal.  Signed, Devlyn Parish PA-C

## 2013-03-02 ENCOUNTER — Encounter: Payer: Self-pay | Admitting: Internal Medicine

## 2013-03-02 ENCOUNTER — Ambulatory Visit (INDEPENDENT_AMBULATORY_CARE_PROVIDER_SITE_OTHER): Payer: Medicare HMO | Admitting: *Deleted

## 2013-03-02 DIAGNOSIS — I5022 Chronic systolic (congestive) heart failure: Secondary | ICD-10-CM

## 2013-03-02 DIAGNOSIS — I428 Other cardiomyopathies: Secondary | ICD-10-CM

## 2013-03-02 DIAGNOSIS — R001 Bradycardia, unspecified: Secondary | ICD-10-CM

## 2013-03-02 DIAGNOSIS — I498 Other specified cardiac arrhythmias: Secondary | ICD-10-CM

## 2013-03-02 LAB — MDC_IDC_ENUM_SESS_TYPE_INCLINIC
Brady Statistic AP VP Percent: 20.98 %
Brady Statistic AP VS Percent: 0.38 %
Brady Statistic AS VS Percent: 1.9 %
Brady Statistic RA Percent Paced: 21.37 %
Brady Statistic RV Percent Paced: 95.21 %
Date Time Interrogation Session: 20150129113032
HIGH POWER IMPEDANCE MEASURED VALUE: 64 Ohm
HighPow Impedance: 209 Ohm
Lead Channel Impedance Value: 494 Ohm
Lead Channel Impedance Value: 646 Ohm
Lead Channel Pacing Threshold Amplitude: 0.5 V
Lead Channel Pacing Threshold Amplitude: 0.75 V
Lead Channel Pacing Threshold Amplitude: 4.75 V
Lead Channel Pacing Threshold Pulse Width: 0.4 ms
Lead Channel Sensing Intrinsic Amplitude: 13.375 mV
Lead Channel Sensing Intrinsic Amplitude: 17.625 mV
Lead Channel Setting Pacing Amplitude: 3.5 V
Lead Channel Setting Pacing Amplitude: 6 V
Lead Channel Setting Pacing Pulse Width: 0.4 ms
Lead Channel Setting Sensing Sensitivity: 0.3 mV
MDC IDC MSMT BATTERY REMAINING LONGEVITY: 47 mo
MDC IDC MSMT BATTERY VOLTAGE: 3.03 V
MDC IDC MSMT LEADCHNL LV PACING THRESHOLD PULSEWIDTH: 0.4 ms
MDC IDC MSMT LEADCHNL RA PACING THRESHOLD PULSEWIDTH: 0.4 ms
MDC IDC MSMT LEADCHNL RA SENSING INTR AMPL: 3 mV
MDC IDC MSMT LEADCHNL RA SENSING INTR AMPL: 3.125 mV
MDC IDC SET LEADCHNL LV PACING PULSEWIDTH: 0.4 ms
MDC IDC SET LEADCHNL RA PACING AMPLITUDE: 3.5 V
MDC IDC SET ZONE DETECTION INTERVAL: 250 ms
MDC IDC SET ZONE DETECTION INTERVAL: 300 ms
MDC IDC STAT BRADY AS VP PERCENT: 76.73 %
Zone Setting Detection Interval: 350 ms
Zone Setting Detection Interval: 450 ms

## 2013-03-02 NOTE — Progress Notes (Signed)
Wound check appointment. No steri-strips, surgical glue present. Wound without redness or edema. Incision edges approximated, wound well healed. Normal device function. Thresholds, sensing, and impedances consistent with implant measurements. Device programmed at 3.5V for extra safety margin until 3 month visit (6.0V for LV). Histogram distribution appropriate for patient and level of activity. No mode switches or ventricular arrhythmias noted. Patient educated about wound care, arm mobility, lifting restrictions, shock plan.   ROV w/ SK 06/01/13 @ 4:30.

## 2013-03-08 ENCOUNTER — Encounter: Payer: Self-pay | Admitting: Internal Medicine

## 2013-04-20 ENCOUNTER — Ambulatory Visit (INDEPENDENT_AMBULATORY_CARE_PROVIDER_SITE_OTHER): Payer: Commercial Managed Care - HMO | Admitting: *Deleted

## 2013-04-20 ENCOUNTER — Ambulatory Visit
Admission: RE | Admit: 2013-04-20 | Discharge: 2013-04-20 | Disposition: A | Discharge status: Home / Self Care | Payer: Commercial Managed Care - HMO | Source: Ambulatory Visit | Attending: Cardiovascular Disease | Admitting: Cardiovascular Disease

## 2013-04-20 ENCOUNTER — Ambulatory Visit (INDEPENDENT_AMBULATORY_CARE_PROVIDER_SITE_OTHER): Payer: Commercial Managed Care - HMO | Admitting: Cardiovascular Disease

## 2013-04-20 ENCOUNTER — Encounter: Payer: Self-pay | Admitting: Cardiovascular Disease

## 2013-04-20 VITALS — BP 126/72 | HR 60 | Ht 67.0 in

## 2013-04-20 DIAGNOSIS — I10 Essential (primary) hypertension: Secondary | ICD-10-CM

## 2013-04-20 DIAGNOSIS — I428 Other cardiomyopathies: Secondary | ICD-10-CM

## 2013-04-20 DIAGNOSIS — I5022 Chronic systolic (congestive) heart failure: Secondary | ICD-10-CM

## 2013-04-20 DIAGNOSIS — T82190A Other mechanical complication of cardiac electrode, initial encounter: Secondary | ICD-10-CM

## 2013-04-20 DIAGNOSIS — T82110A Breakdown (mechanical) of cardiac electrode, initial encounter: Secondary | ICD-10-CM

## 2013-04-20 LAB — MDC_IDC_ENUM_SESS_TYPE_INCLINIC
Brady Statistic AP VP Percent: 26.36 %
Brady Statistic AP VS Percent: 0.49 %
Brady Statistic AS VP Percent: 70.47 %
Brady Statistic AS VS Percent: 2.68 %
Date Time Interrogation Session: 20150319113140
HIGH POWER IMPEDANCE MEASURED VALUE: 190 Ohm
HIGH POWER IMPEDANCE MEASURED VALUE: 71 Ohm
Lead Channel Impedance Value: 494 Ohm
Lead Channel Pacing Threshold Amplitude: 0.625 V
Lead Channel Pacing Threshold Amplitude: 1.25 V
Lead Channel Pacing Threshold Amplitude: 2.25 V
Lead Channel Pacing Threshold Amplitude: 6 V
Lead Channel Pacing Threshold Pulse Width: 0.4 ms
Lead Channel Pacing Threshold Pulse Width: 0.4 ms
Lead Channel Pacing Threshold Pulse Width: 0.4 ms
Lead Channel Sensing Intrinsic Amplitude: 4.125 mV
Lead Channel Sensing Intrinsic Amplitude: 4.25 mV
Lead Channel Setting Pacing Amplitude: 3.5 V
Lead Channel Setting Pacing Amplitude: 3.5 V
Lead Channel Setting Pacing Pulse Width: 0.4 ms
Lead Channel Setting Sensing Sensitivity: 0.3 mV
MDC IDC MSMT BATTERY REMAINING LONGEVITY: 90 mo
MDC IDC MSMT BATTERY VOLTAGE: 2.99 V
MDC IDC MSMT LEADCHNL LV PACING THRESHOLD PULSEWIDTH: 0.4 ms
MDC IDC MSMT LEADCHNL RV IMPEDANCE VALUE: 589 Ohm
MDC IDC MSMT LEADCHNL RV PACING THRESHOLD AMPLITUDE: 0.5 V
MDC IDC MSMT LEADCHNL RV PACING THRESHOLD PULSEWIDTH: 0.4 ms
MDC IDC MSMT LEADCHNL RV SENSING INTR AMPL: 14.375 mV
MDC IDC SET LEADCHNL LV PACING AMPLITUDE: 2.5 V
MDC IDC SET LEADCHNL RV PACING PULSEWIDTH: 0.4 ms
MDC IDC SET ZONE DETECTION INTERVAL: 300 ms
MDC IDC STAT BRADY RA PERCENT PACED: 26.85 %
MDC IDC STAT BRADY RV PERCENT PACED: 93.77 %
Zone Setting Detection Interval: 250 ms
Zone Setting Detection Interval: 350 ms
Zone Setting Detection Interval: 450 ms

## 2013-04-20 MED ORDER — SPIRONOLACTONE 25 MG PO TABS: 25.0000 mg | ORAL_TABLET | Freq: Every day | ORAL | Status: DC

## 2013-04-20 NOTE — Progress Notes (Signed)
CRT-D device check in office. Sensing consistent with previous device measurements. Lead impedance trends stable over time.  LV no capture  programmed LV4->RV coil.  Patient c/o no energy.  CXR to be done.  LV reprogrammed LV2->RV coil with threshold 1.25@ 0.4.   No mode switch episodes recorded. No ventricular arrhythmia episodes recorded.  Device programmed with appropriate safety margins. Heart failure diagnostics reviewed and trends are stable for patient. Audible/vibratory alerts demonstrated for patient. No changes made this session. Estimated longevity 3.3 years.  Patient enrolled in remote follow up. Plan to check device remotely in 3 months and see in office in 6 months. Patient education completed including shock plan.  Follow up as scheduled with Dr. Caryl Comes in April.

## 2013-04-20 NOTE — Assessment & Plan Note (Signed)
Continue with aggressive medical management.

## 2013-04-20 NOTE — Assessment & Plan Note (Addendum)
She is s/p Bi-V ICD.  She really has not had a lot of improvement after placement of the device.   We had her biventricular pacemaker interrogated. We found that her LV lead was not capturing at all. Paula reprogram the pacer and was able to achieve capture at a fairly acceptable voltage. We will continue with these settings.  We'll get a chest x-ray to make sure that her leads are still in the right place. I would like to increase her Aldactone to 25 mg a day. She will followup with Dr. Caryl Comes in the next month or so and I'll see her in 3 months.

## 2013-04-20 NOTE — Progress Notes (Signed)
[facility]. [address] [place], [place]  [address] Phone: [phone] Fax:  [phone]  Date:  04/20/2013   ID:  Heather Smith, DOB 09-16-1932, MRN [id number]  PCP:  Shirline Frees, MD  Cardiologist:  Dr. Liam Rogers    Problem List 1. Acute on chronic systlic CHf- normal cors by cath 2. LBBB 3. HTN 4. Hypothyroidism  History of Present Illness: Heather Smith is a 79 y.o. female who returns for follow up after a recent admission to the hospital with CP and SOB.   She has a hx of normal cardiac catheterization in the 1990s, HTN, HL, LBBB, hypothyroidism, GERD.  Myoview 4/14: Low risk, no ischemia, small, fixed mild apical septal perfusion defect; not gated. She was admitted 9/1-9/3 after presenting with chest pain, dyspnea and near syncope. She ruled out for myocardial infarction by enzymes. LHC 10/04/12: Normal coronary arteries, EF 25-30% with global HK.  Echocardiogram 10/05/12: EF 30-35%, diffuse HK, moderate LAE.  She was diagnosed with nonischemic cardiomyopathy. Medications were adjusted.  Since d/c she has been fatigued.  No CP.  No significant dyspnea.  She is NYHA Class II.  No orthopnea, PND, edema.   No syncope.  She had had more pain with DJD since having to stop Meloxicam.    Nov. 13, 2014:  She is feeling poorly - fatigued.  Also is having lots of arthritis problems because she has not been able to take her arthritis meds because of her systolic chf.  Dec. 16, 4008:  Heather Smith  Continues to have dyspnea.  Recent echo shows:  Persistently depressed left ventricular systolic function with an ejection fraction of 20-25%.  She is till limited in her activities.  She has some mild lightheadedness.   April 20, 2013:  She has had a Bi-V ICD placed Jan. 15, 2015.   She does not feel all that much better since the ICD placement.  She feels about the same.     Wt Readings from Last 3 Encounters:  02/23/13 183 lb 6.8 oz (83.2 kg)  02/23/13 183 lb 6.8 oz  (83.2 kg)  02/08/13 177 lb 12.8 oz (80.65 kg)     Past Medical History  Diagnosis Date  . Hypertension   . Hypercholesterolemia   . Left bundle branch block   . Hypothyroidism   . Spinal stenosis   . GERD (gastroesophageal reflux disease)   . Vitamin D deficiency   . Neurogenic claudication due to lumbar spinal stenosis   . Chest pain     a. Nl cath in the 90's;  b. 04/2005: Myoview: subtle areao of ? reversibility in apical segment of anteromedial LV - ? attenuation, EF 54%;  c. 05/2012 MV: small, fixed mild apical septal perfusion defect, ? attenuation due to lbbb, no ischemia.  . Non-ischemic cardiomyopathy     LHC 10/04/12: Normal coronary arteries, EF 25-30% with global HK.  Echocardiogram 10/05/12: EF 30-35%, diffuse HK, moderate LAE. Echocardiogram (01/2013): Lateral to septal wall dyssynchrony EF 20-25%, diffuse HK  . Chronic systolic heart failure   . Automatic implantable cardioverter-defibrillator in situ   . CHF (congestive heart failure)   . Osteoarthritis     "about all over" (02/22/2013)    Current Outpatient Prescriptions  Medication Sig Dispense Refill  . Ascorbic Acid (VITAMIN C) 1000 MG tablet Take 1,000 mg by mouth at bedtime.       . Biotin (BIOTIN 5000) 5 MG CAPS Take 5,000 mg by mouth  daily.      . carvedilol (COREG) 6.25 MG tablet Take 1 tablet (6.25 mg total) by mouth 2 (two) times daily with a meal.  60 tablet  11  . cholecalciferol (VITAMIN D) 1000 UNITS tablet Take 1,000 Units by mouth daily.        . citalopram (CELEXA) 10 MG tablet Take 1 tablet (10 mg total) by mouth daily.  30 tablet  11  . levothyroxine (SYNTHROID, LEVOTHROID) 50 MCG tablet Take 50 mcg by mouth daily.        Marland Kitchen lisinopril (PRINIVIL,ZESTRIL) 20 MG tablet Take 1 tablet (20 mg total) by mouth at bedtime.  30 tablet  11  . multivitamin (THERAGRAN) per tablet Take 1 tablet by mouth daily.        Marland Kitchen omeprazole (PRILOSEC) 20 MG capsule Take 40 mg by mouth daily.       Marland Kitchen spironolactone (ALDACTONE)  25 MG tablet Take 0.5 tablets (12.5 mg total) by mouth daily.  30 tablet  11  . traMADol (ULTRAM) 50 MG tablet Take 1 tablet by mouth every 12 (twelve) hours as needed for moderate pain.       . vitamin E 400 UNIT capsule Take 400 Units by mouth daily.       No current facility-administered medications for this visit.    Allergies:    Allergies  Allergen Reactions  . Niaspan [Niacin Er]     High doses, causes hot flashes & aching   . Statins Other (See Comments)    "Makes her bones hurt and very sore"    Social History:  The patient  reports that she has never smoked. She has never used smokeless tobacco. She reports that she does not drink alcohol or use illicit drugs.   ROS:  Please see the history of present illness.   All other systems reviewed and negative.   PHYSICAL EXAM: VS:  BP 126/72  Pulse 60  Ht 5\' 7"  (1.702 m) Well nourished, well developed, in no acute distress HEENT: normal Neck: no JVD Cardiac:  normal S1, S2; RRR; no murmur, ICD site looks good.   Lungs:  clear to auscultation bilaterally, no wheezing, rhonchi or rales Abd: soft, nontender, no hepatomegaly Ext: no edema; right wrist without hematoma or mass  Skin: warm and dry Neuro:  CNs 2-12 intact, no focal abnormalities noted  EKG:      ASSESSMENT AND PLAN:

## 2013-04-20 NOTE — Patient Instructions (Addendum)
Your physician has recommended you make the following change in your medication:  INCREASE SPIRONOLACTONE TO 25 MG DAILY  A chest x-ray takes a picture of the organs and structures inside the chest, including the heart, lungs, and blood vessels. This test can show several things, including, whether the heart is enlarges; whether fluid is building up in the lungs; and whether pacemaker / defibrillator leads are still in place.  Your physician recommends that you return for lab work in: [facility] DRAW//YOU CAN EAT/ COME BETWEEN 7:30 AM  - 5 PM    Your physician recommends that you schedule a follow-up appointment in: 3 months

## 2013-04-27 ENCOUNTER — Other Ambulatory Visit (INDEPENDENT_AMBULATORY_CARE_PROVIDER_SITE_OTHER): Payer: Commercial Managed Care - HMO

## 2013-04-27 DIAGNOSIS — I5022 Chronic systolic (congestive) heart failure: Secondary | ICD-10-CM

## 2013-04-27 LAB — BASIC METABOLIC PANEL
BUN: 24 mg/dL — AB (ref 6–23)
CHLORIDE: 104 meq/L (ref 96–112)
CO2: 26 mEq/L (ref 19–32)
Calcium: 9.4 mg/dL (ref 8.4–10.5)
Creatinine, Ser: 1.1 mg/dL (ref 0.4–1.2)
GFR: 49.37 mL/min — ABNORMAL LOW (ref >60.00)
GLUCOSE: 89 mg/dL (ref 70–99)
POTASSIUM: 4.7 meq/L (ref 3.5–5.1)
SODIUM: 137 meq/L (ref 135–145)

## 2013-05-02 ENCOUNTER — Encounter: Payer: Self-pay | Admitting: Internal Medicine

## 2013-05-12 ENCOUNTER — Telehealth: Payer: Self-pay | Admitting: Internal Medicine

## 2013-05-12 NOTE — Telephone Encounter (Signed)
New Prob    Pt has some concerns regarding her device and some recent symptoms she has been experiencing. Requesting to speak to a nurse. Please call.

## 2013-05-12 NOTE — Telephone Encounter (Signed)
Spoke with pt who reports she has appt with Dr. Caryl Comes at the end of April but is requesting sooner appt as she does not feel any better since device put in.  She reports she continues to have shortness of breath with activity. Very tired. Pt states she saw Dr. Acie Fredrickson on 3/19 with same complaints. Device was reprogrammed but pt reports she does not notice any improvement.  Will send to Dr. Caryl Comes and Venida Jarvis regarding possible earlier appt.

## 2013-05-13 NOTE — Telephone Encounter (Signed)
i can see her in [place] office on tues at 915 but otherwise will be gone until the end of the month Thanks

## 2013-05-15 NOTE — Telephone Encounter (Signed)
Offered [facility] appt tomorrow - pt hesitant, states she will wait until the scheduled appt on 4/28 in [place] office.  Advised her if complaints worsen or problematic to call us back to be seen sooner by another doctor in office. She is agreeable.

## 2013-05-30 ENCOUNTER — Ambulatory Visit (INDEPENDENT_AMBULATORY_CARE_PROVIDER_SITE_OTHER): Payer: Commercial Managed Care - HMO | Admitting: Internal Medicine

## 2013-05-30 ENCOUNTER — Encounter: Payer: Self-pay | Admitting: Internal Medicine

## 2013-05-30 VITALS — BP 138/77 | HR 60 | Ht 67.0 in | Wt 185.0 lb

## 2013-05-30 DIAGNOSIS — I428 Other cardiomyopathies: Secondary | ICD-10-CM

## 2013-05-30 DIAGNOSIS — T82198A Other mechanical complication of other cardiac electronic device, initial encounter: Secondary | ICD-10-CM

## 2013-05-30 DIAGNOSIS — Z95810 Presence of automatic (implantable) cardiac defibrillator: Secondary | ICD-10-CM | POA: Insufficient documentation

## 2013-05-30 DIAGNOSIS — I5022 Chronic systolic (congestive) heart failure: Secondary | ICD-10-CM

## 2013-05-30 HISTORY — DX: Other mechanical complication of other cardiac electronic device, initial encounter: T82.198A

## 2013-05-30 LAB — MDC_IDC_ENUM_SESS_TYPE_INCLINIC
Battery Remaining Longevity: 62 mo
Battery Voltage: 3 V
Brady Statistic AP VP Percent: 25.26 %
Brady Statistic AS VP Percent: 72.7 %
Brady Statistic RA Percent Paced: 25.76 %
Brady Statistic RV Percent Paced: 95.81 %
Date Time Interrogation Session: 20150428121339
HIGH POWER IMPEDANCE MEASURED VALUE: 209 Ohm
HIGH POWER IMPEDANCE MEASURED VALUE: 70 Ohm
Lead Channel Impedance Value: 646 Ohm
Lead Channel Pacing Threshold Amplitude: 0.5 V
Lead Channel Pacing Threshold Amplitude: 0.75 V
Lead Channel Pacing Threshold Amplitude: 1.625 V
Lead Channel Pacing Threshold Pulse Width: 0.4 ms
Lead Channel Sensing Intrinsic Amplitude: 11.875 mV
Lead Channel Sensing Intrinsic Amplitude: 11.875 mV
Lead Channel Sensing Intrinsic Amplitude: 3.25 mV
Lead Channel Setting Pacing Amplitude: 3.5 V
Lead Channel Setting Pacing Amplitude: 3.5 V
Lead Channel Setting Pacing Pulse Width: 0.7 ms
MDC IDC MSMT LEADCHNL LV PACING THRESHOLD PULSEWIDTH: 0.4 ms
MDC IDC MSMT LEADCHNL RA IMPEDANCE VALUE: 494 Ohm
MDC IDC MSMT LEADCHNL RA PACING THRESHOLD PULSEWIDTH: 0.4 ms
MDC IDC MSMT LEADCHNL RA SENSING INTR AMPL: 3.25 mV
MDC IDC SET LEADCHNL RV PACING AMPLITUDE: 3.5 V
MDC IDC SET LEADCHNL RV PACING PULSEWIDTH: 0.4 ms
MDC IDC SET LEADCHNL RV SENSING SENSITIVITY: 0.3 mV
MDC IDC SET ZONE DETECTION INTERVAL: 350 ms
MDC IDC STAT BRADY AP VS PERCENT: 0.5 %
MDC IDC STAT BRADY AS VS PERCENT: 1.53 %
Zone Setting Detection Interval: 250 ms
Zone Setting Detection Interval: 300 ms
Zone Setting Detection Interval: 450 ms

## 2013-05-30 MED ORDER — CITALOPRAM HYDROBROMIDE 10 MG PO TABS: 20.0000 mg | ORAL_TABLET | Freq: Every day | ORAL | Status: DC

## 2013-05-30 NOTE — Patient Instructions (Signed)
Your physician has recommended you make the following change in your medication:  1) INCREASE Citalopram to 20 mg daily  Your physician has recommended that you have an AV optimization echo. During this procedure, an echocardiogram is performed to optimize the timing of your device using ultrasound and a device programmer. Changes will be made to the device settings to help the heart chambers pump more efficiently. This procedure takes approximately one hour.  You have been referred to CHF clinic

## 2013-05-30 NOTE — Progress Notes (Signed)
ELECTROPHYSIOLOGY CONSULT NOTE  Patient ID: Heather Smith, MRN: [id number], DOB/AGE: [birth year] 78 y.o. Admit date: (Not on file) Date of Consult: 05/30/2013  Primary Physician: Shirline Frees, MD Primary Cardiologist [facility]  Chief Complaint:  ?  CRT   HPI Heather Smith is a 80 y.o. female  Seen in followup for CRT implantation for nonischemic cardiomyopathy and depressed LV function. This was accomplished fall 2014  Interval partial lead dislodgment noted after she saw Dr. Mariana Single in March and an x-ray was obtained and reviewed.  There is been no interval improvement in her symptoms of fatigue shortness of breath. She remains depressed. We started her on citalopram last time without any improvement.   There is no edema or nocturnal dyspnea.     Past Medical History  Diagnosis Date  . Hypertension   . Hypercholesterolemia   . Left bundle branch block   . Hypothyroidism   . Spinal stenosis   . GERD (gastroesophageal reflux disease)   . Vitamin D deficiency   . Neurogenic claudication due to lumbar spinal stenosis   . Chest pain     a. Nl cath in the 90's;  b. 04/2005: Myoview: subtle areao of ? reversibility in apical segment of anteromedial LV - ? attenuation, EF 54%;  c. 05/2012 MV: small, fixed mild apical septal perfusion defect, ? attenuation due to lbbb, no ischemia.  . Non-ischemic cardiomyopathy     LHC 10/04/12: Normal coronary arteries, EF 25-30% with global HK.  Echocardiogram 10/05/12: EF 30-35%, diffuse HK, moderate LAE. Echocardiogram (01/2013): Lateral to septal wall dyssynchrony EF 20-25%, diffuse HK  . Chronic systolic heart failure   . Automatic implantable cardioverter-defibrillator in situ   . CHF (congestive heart failure)   . Osteoarthritis     "about all over" (02/22/2013)      Surgical History:  Past Surgical History  Procedure Laterality Date  . Tubal ligation    . Foot arthrodesis, modified mcbride Bilateral   . Knee arthroscopy  Left   . Knee arthroscopy Right   . Cardiac catheterization  2000  . Lumbar laminectomy  11/03/2006     Bilateral 3, 4, 5 laminectomy, partial L2, decompression of the thecal sac, foraminotomy, posterolateral arthrodesis L3-S1 with autograft   -- SURGEON:  Leeroy Cha, M.D.   . Cardiac defibrillator placement  02/22/2013    with LV lead placement and HV lead testing/notes 02/22/2013  . Appendectomy    . Excisional hemorrhoidectomy    . Tonsillectomy    . Blepharoplasty Bilateral   . Vaginal hysterectomy       Home Meds: Prior to Admission medications   Medication Sig Start Date End Date Taking? Authorizing Provider  Ascorbic Acid (VITAMIN C) 1000 MG tablet Take 1,000 mg by mouth at bedtime.    Yes Historical Provider, MD  Biotin (BIOTIN 5000) 5 MG CAPS Take 5,000 mg by mouth daily.   Yes Historical Provider, MD  carvedilol (COREG) 6.25 MG tablet Take 1 tablet (6.25 mg total) by mouth 2 (two) times daily with a meal. 10/05/12  Yes Rhonda G Barrett, PA-C  cholecalciferol (VITAMIN D) 1000 UNITS tablet Take 1,000 Units by mouth daily.     Yes Historical Provider, MD  levothyroxine (SYNTHROID, LEVOTHROID) 50 MCG tablet Take 50 mcg by mouth daily.     Yes Historical Provider, MD  lisinopril (PRINIVIL,ZESTRIL) 20 MG tablet Take 1 tablet (20 mg total) by mouth 2 (two) times daily. 12/15/12  Yes Thayer Headings, MD  Magnesium  250 MG TABS Take 250 mg by mouth daily.    Yes Historical Provider, MD  multivitamin [facility]) per tablet Take 1 tablet by mouth daily.     Yes Historical Provider, MD  omeprazole (PRILOSEC) 20 MG capsule Take 40 mg by mouth daily.    Yes Historical Provider, MD  spironolactone (ALDACTONE) 25 MG tablet Take 0.5 tablets (12.5 mg total) by mouth daily. 10/05/12  Yes Rhonda G Barrett, PA-C  traMADol (ULTRAM) 50 MG tablet Take 1 tablet by mouth as needed. 10/06/12  Yes Historical Provider, MD  vitamin E 400 UNIT capsule Take 400 Units by mouth daily.   Yes Historical Provider, MD     Inpatient Medications:     Allergies:  Allergies  Allergen Reactions  . Niaspan [Niacin Er]     High doses, causes hot flashes & aching   . Statins Other (See Comments)    "Makes her bones hurt and very sore"    History   Social History  . Marital Status: Married    Spouse Name: N/A    Number of Children: N/A  . Years of Education: N/A   Occupational History  . Not on file.   Social History Main Topics  . Smoking status: Never Smoker   . Smokeless tobacco: Never Used  . Alcohol Use: No  . Drug Use: No  . Sexual Activity: Not Currently   Other Topics Concern  . Not on file   Social History Narrative   Lives in [place], [place] with husband.  Active around the house.     Family History  Problem Relation Age of Onset  . Heart attack Mother     died @ 74.  . Thyroid disease Mother   . Lung cancer Mother   . Hyperlipidemia Mother   . Aneurysm Father     died @ 61.  . Aneurysm Brother     has had an Aneurysm x3  . Colon cancer Brother   . Bone cancer Brother   . Lung cancer Brother   . Stomach cancer Brother      ROS:  Please see the history of present illness.    All other systems reviewed and negative.    Physical Exam: Blood pressure 138/77, pulse 60, height 5\' 7"  (1.702 m), weight 185 lb (83.915 kg). Well developed and nourished in no acute distress HENT normal Neck supple with JVP-flat Carotids brisk and full without bruits Clear Device pocket well healed; without hematoma or erythema.  There is no tethering  Regular rate and rhythm, no murmurs or gallops Abd-soft with active BS without hepatomegaly No Clubbing cyanosis edema Skin-warm and dry A & Oriented  Grossly normal sensory and motor function     Labs: Cardiac Enzymes No results found for this basename: CKTOTAL, CKMB, TROPONINI,  in the last 72 hours CBC Lab Results  Component Value Date   WBC 4.1* 02/17/2013   HGB 12.4 02/17/2013   HCT 35.7* 02/17/2013   MCV 90.5 02/17/2013   PLT  173.0 02/17/2013   PROTIME: No results found for this basename: LABPROT, INR,  in the last 72 hours Chemistry No results found for this basename: NA, K, CL, CO2, BUN, CREATININE, CALCIUM, LABALBU, PROT, BILITOT, ALKPHOS, ALT, AST, GLUCOSE,  in the last 168 hours Lipids Lab Results  Component Value Date   CHOL 175 08/22/2010   HDL 51.50 08/22/2010   LDLCALC 95 08/22/2010   TRIG 142.0 08/22/2010   BNP No results found for this basename: probnp  Miscellaneous No results found for this basename: DDIMER    Radiology/Studies:  No results found.  EKG: Sinus rhythm  P. synchronous pacing intervals 14/14/46   Assessment and Plan:    Nonischemic cardiac myopathy  Left bundle branch block  Congestive heart failure-chronic-systolic-class III  Sinus bradycardia  PVCs  Dizziness  CRT-D.-Medtronic  Lead dislodgement  Depression  She continues with class 2-3 symptoms. There is also significant fatigue. We have reprogrammed her device to capture pacing from the most distal tips because of lead dislodgment  We'll undertake an echo to look for AV optimization as well as repeat chest x-ray to make sure there has been no further dislodgment and her lead.  Will increase her citalopram 10--20 that after a follow up with her PCP regarding this.  We will refer her to heart failure  e clinic for their assistance     Deboraha Sprang

## 2013-06-01 ENCOUNTER — Encounter: Payer: Medicare HMO | Admitting: Internal Medicine

## 2013-06-05 ENCOUNTER — Encounter (HOSPITAL_COMMUNITY): Payer: Self-pay

## 2013-06-05 ENCOUNTER — Ambulatory Visit (HOSPITAL_COMMUNITY)
Admission: RE | Admit: 2013-06-05 | Discharge: 2013-06-05 | Disposition: A | Discharge status: Home / Self Care | Payer: Medicare HMO | Source: Ambulatory Visit | Attending: Cardiology | Admitting: Cardiology

## 2013-06-05 VITALS — BP 124/62 | HR 67 | Ht 67.5 in | Wt 184.8 lb

## 2013-06-05 DIAGNOSIS — E785 Hyperlipidemia, unspecified: Secondary | ICD-10-CM | POA: Insufficient documentation

## 2013-06-05 DIAGNOSIS — I5023 Acute on chronic systolic (congestive) heart failure: Secondary | ICD-10-CM

## 2013-06-05 DIAGNOSIS — I447 Left bundle-branch block, unspecified: Secondary | ICD-10-CM | POA: Insufficient documentation

## 2013-06-05 DIAGNOSIS — M549 Dorsalgia, unspecified: Secondary | ICD-10-CM | POA: Insufficient documentation

## 2013-06-05 DIAGNOSIS — I10 Essential (primary) hypertension: Secondary | ICD-10-CM

## 2013-06-05 DIAGNOSIS — F3289 Other specified depressive episodes: Secondary | ICD-10-CM | POA: Insufficient documentation

## 2013-06-05 DIAGNOSIS — F329 Major depressive disorder, single episode, unspecified: Secondary | ICD-10-CM

## 2013-06-05 DIAGNOSIS — I5022 Chronic systolic (congestive) heart failure: Secondary | ICD-10-CM | POA: Insufficient documentation

## 2013-06-05 DIAGNOSIS — F32A Depression, unspecified: Secondary | ICD-10-CM

## 2013-06-05 DIAGNOSIS — I252 Old myocardial infarction: Secondary | ICD-10-CM | POA: Insufficient documentation

## 2013-06-05 DIAGNOSIS — M1990 Unspecified osteoarthritis, unspecified site: Secondary | ICD-10-CM | POA: Insufficient documentation

## 2013-06-05 DIAGNOSIS — I428 Other cardiomyopathies: Secondary | ICD-10-CM | POA: Insufficient documentation

## 2013-06-05 DIAGNOSIS — Z79899 Other long term (current) drug therapy: Secondary | ICD-10-CM | POA: Insufficient documentation

## 2013-06-05 DIAGNOSIS — I509 Heart failure, unspecified: Secondary | ICD-10-CM | POA: Insufficient documentation

## 2013-06-05 DIAGNOSIS — Z95810 Presence of automatic (implantable) cardiac defibrillator: Secondary | ICD-10-CM | POA: Insufficient documentation

## 2013-06-05 MED ORDER — FUROSEMIDE 20 MG PO TABS: 20.0000 mg | ORAL_TABLET | Freq: Every day | ORAL | Status: DC

## 2013-06-05 MED ORDER — LISINOPRIL 20 MG PO TABS: ORAL_TABLET | ORAL | Status: DC

## 2013-06-05 NOTE — Patient Instructions (Signed)
Increase Lisinopril to 20 mg (1 tab) in AM and 10 mg (1/2 tab) in PM  Start Furosemide (lasix) 20 mg daily  Labs on 5/15 at Dr Olin Pia office  Your physician has recommended that you have a cardiopulmonary stress test (CPX). CPX testing is a non-invasive measurement of heart and lung function. It replaces a traditional treadmill stress test. This type of test provides a tremendous amount of information that relates not only to your present condition but also for future outcomes. This test combines measurements of you ventilation, respiratory gas exchange in the lungs, electrocardiogram (EKG), blood pressure and physical response before, during, and following an exercise protocol.  Your physician recommends that you schedule a follow-up appointment in: 3 weeks

## 2013-06-06 DIAGNOSIS — F329 Major depressive disorder, single episode, unspecified: Secondary | ICD-10-CM | POA: Insufficient documentation

## 2013-06-06 DIAGNOSIS — F32A Depression, unspecified: Secondary | ICD-10-CM | POA: Insufficient documentation

## 2013-06-06 NOTE — Progress Notes (Signed)
Patient ID: Heather Smith, female   DOB: September 14, 1932, 80 y.o.   MRN: [id number] PCP: Dr. Kenton Kingfisher Cardiology: Caryl Comes, [place]  80 yo with history of nonischemic cardiomyopathy presents for CHF clinic evaluation.  Patient has felt like her energy level has been low for about 2 years now.  In 9/14, she was admitted to [facility] with chest pain.  Cardiac cath was done, showing no significant coronary disease.  However, echo showed EF 30-35%.  She was started on meds for CHF, and echo was repeated in 12/14, EF was read as 20-25%.  She had a Medtonic CRT-D device placed (had baseline LBBB).  In 3/15, the LV lead was not capturing properly so device had to be reprogrammed.  She is planned for AV-optimization with Dr. Caryl Comes soon.  She has also been started on medication for depression recently.   She really has not seen much improvement with CRT, even after the device reprogramming in 3/15.  She is short of breath and tired after walking < 1 block or walking up steps.  No lightheadedness with standing.  She does have bendopnea.  No orthopnea/PND.  No chest pain.   Optivol was interrogated today.  Fluid index is near threshold and thoracic impedence is falling.    Labs (9/14): TSH normal Labs (3/15): K 4.7, creatinine 1.1  PMH: 1. Chronic systolic CHF: Nonischemic cardiomyopathy.  LHC (9/14) with no significant coronary disease, EF 25-30% with global hypokinesis.  Echo (9/14) with EF 30-35%, moderate LV dilation, diffuse hypokinesis.  Echo (12/14) with EF 20-25%.  She does not drink ETOH.  She had Medtronic CRT-D device implantation in 1/15.  2. Back pain s/p lumbar laminectomy 3. OA 4. Hyperlipidemia 5. Depression  SH: Lives [place], married, never smoked, no ETOH.    FH: Mother with MI at 28, possible ischemic cardiomyopathy.  Father with cardiomyopathy, died at 20.  She has 5 brothers, none with significant coronary disease.    ROS: All systems reviewed and negative except as per HPI.    Current Outpatient Prescriptions  Medication Sig Dispense Refill  . Ascorbic Acid (VITAMIN C) 1000 MG tablet Take 1,000 mg by mouth at bedtime.       . Biotin (BIOTIN 5000) 5 MG CAPS Take 5,000 mg by mouth daily.      . carvedilol (COREG) 6.25 MG tablet Take 1 tablet (6.25 mg total) by mouth 2 (two) times daily with a meal.  60 tablet  11  . cholecalciferol (VITAMIN D) 1000 UNITS tablet Take 1,000 Units by mouth daily.        . citalopram (CELEXA) 10 MG tablet Take 2 tablets (20 mg total) by mouth daily.  60 tablet  1  . levothyroxine (SYNTHROID, LEVOTHROID) 50 MCG tablet Take 50 mcg by mouth daily.        Marland Kitchen lisinopril (PRINIVIL,ZESTRIL) 20 MG tablet Take 1 tab in AM and 1/2 tab in PM  45 tablet  3  . Magnesium Oxide (MAG-200 PO) Take by mouth.      . multivitamin (THERAGRAN) per tablet Take 1 tablet by mouth daily.        Marland Kitchen omeprazole (PRILOSEC) 20 MG capsule Take 40 mg by mouth daily.       Marland Kitchen spironolactone (ALDACTONE) 25 MG tablet Take 1 tablet (25 mg total) by mouth daily.  30 tablet  11  . traMADol (ULTRAM) 50 MG tablet Take 1 tablet by mouth every 12 (twelve) hours as needed for moderate pain.       Marland Kitchen  vitamin E 400 UNIT capsule Take 400 Units by mouth daily.      . furosemide (LASIX) 20 MG tablet Take 1 tablet (20 mg total) by mouth daily.  30 tablet  3   No current facility-administered medications for this encounter.    BP 124/62  Pulse 67  Ht 5' 7.5" (1.715 m)  Wt 184 lb 12.8 oz (83.825 kg)  BMI 28.50 kg/m2  SpO2 98% General: NAD Neck: JVP 8 cm, no thyromegaly or thyroid nodule.  Lungs: Clear to auscultation bilaterally with normal respiratory effort. CV: Nondisplaced PMI.  Heart regular S1/S2, no S3/S4, 2/6 HSM at apex.  No peripheral edema.  No carotid bruit.  Normal pedal pulses.  Abdomen: Soft, nontender, no hepatosplenomegaly, no distention.  Skin: Intact without lesions or rashes.  Neurologic: Alert and oriented x 3.  Psych: Normal affect. Extremities: No clubbing  or cyanosis.  HEENT: Normal.   Assessment/Plan: 1. Chronic systolic CHF: Nonischemic cardiomyopathy.  She does not use ETOH or drugs.  She does have a family history of cardiomyopathy of uncertain etiology (father) but none of her siblings developed a cardiomyopathy.  Familial cardiomyopathy is a consideration.  Viral myocarditis is also a possible etiology.   NYHA class II-III symptoms.  She has mild volume overload on exam, this is corroborated by Optivol. - Add Lasix 20 mg daily with BMET/BNP today and in 3 wks.   - Increase lisinopril to 20 qam, 10 qpm. - Continue current Coreg and spironolactone.  - I am going to arrange for a CPX to get an idea of her functional capacity.  - In terms of advanced therapies, she would not be a transplant candidate given age but LVAD would be a possibility if needed.  - Low sodium diet was counseled.  - AV optimization planned soon by Dr. Caryl Comes.  She has not had much symptomatic improvement from CRT so far. 2. Depression:  Celexa recently increased by Dr. Caryl Comes.  She says she may feel a little better in terms of mood.   Larey Dresser 06/06/2013

## 2013-06-14 ENCOUNTER — Ambulatory Visit (HOSPITAL_COMMUNITY): Payer: Medicare HMO | Attending: Cardiology

## 2013-06-14 DIAGNOSIS — I5022 Chronic systolic (congestive) heart failure: Secondary | ICD-10-CM

## 2013-06-16 ENCOUNTER — Ambulatory Visit (INDEPENDENT_AMBULATORY_CARE_PROVIDER_SITE_OTHER): Payer: Commercial Managed Care - HMO | Admitting: *Deleted

## 2013-06-16 ENCOUNTER — Ambulatory Visit (HOSPITAL_COMMUNITY): Payer: Medicare HMO | Attending: Family Medicine | Admitting: Radiology

## 2013-06-16 ENCOUNTER — Encounter: Payer: Self-pay | Admitting: Internal Medicine

## 2013-06-16 ENCOUNTER — Other Ambulatory Visit: Payer: Self-pay | Admitting: Internal Medicine

## 2013-06-16 DIAGNOSIS — R42 Dizziness and giddiness: Secondary | ICD-10-CM | POA: Insufficient documentation

## 2013-06-16 DIAGNOSIS — I5022 Chronic systolic (congestive) heart failure: Secondary | ICD-10-CM

## 2013-06-16 DIAGNOSIS — I509 Heart failure, unspecified: Secondary | ICD-10-CM | POA: Insufficient documentation

## 2013-06-16 DIAGNOSIS — I10 Essential (primary) hypertension: Secondary | ICD-10-CM | POA: Insufficient documentation

## 2013-06-16 DIAGNOSIS — R5383 Other fatigue: Secondary | ICD-10-CM

## 2013-06-16 DIAGNOSIS — R5381 Other malaise: Secondary | ICD-10-CM | POA: Insufficient documentation

## 2013-06-16 DIAGNOSIS — I428 Other cardiomyopathies: Secondary | ICD-10-CM | POA: Insufficient documentation

## 2013-06-16 DIAGNOSIS — E785 Hyperlipidemia, unspecified: Secondary | ICD-10-CM | POA: Insufficient documentation

## 2013-06-16 LAB — MDC_IDC_ENUM_SESS_TYPE_INCLINIC
Battery Remaining Longevity: 50 mo
Brady Statistic AP VS Percent: 0.43 %
Brady Statistic AS VP Percent: 78.94 %
Brady Statistic AS VS Percent: 1.58 %
Brady Statistic RA Percent Paced: 19.48 %
Brady Statistic RV Percent Paced: 96.34 %
HighPow Impedance: 209 Ohm
HighPow Impedance: 69 Ohm
Lead Channel Impedance Value: 494 Ohm
Lead Channel Pacing Threshold Amplitude: 0.625 V
Lead Channel Pacing Threshold Amplitude: 3 V
Lead Channel Pacing Threshold Pulse Width: 0.4 ms
Lead Channel Pacing Threshold Pulse Width: 0.7 ms
Lead Channel Sensing Intrinsic Amplitude: 13 mV
Lead Channel Sensing Intrinsic Amplitude: 13.375 mV
Lead Channel Sensing Intrinsic Amplitude: 3.375 mV
Lead Channel Setting Pacing Amplitude: 1.5 V
Lead Channel Setting Pacing Amplitude: 2 V
Lead Channel Setting Pacing Pulse Width: 0.4 ms
Lead Channel Setting Sensing Sensitivity: 0.3 mV
MDC IDC MSMT BATTERY VOLTAGE: 2.97 V
MDC IDC MSMT LEADCHNL RA SENSING INTR AMPL: 2.75 mV
MDC IDC MSMT LEADCHNL RV IMPEDANCE VALUE: 703 Ohm
MDC IDC MSMT LEADCHNL RV PACING THRESHOLD AMPLITUDE: 0.5 V
MDC IDC MSMT LEADCHNL RV PACING THRESHOLD PULSEWIDTH: 0.4 ms
MDC IDC SESS DTM: 20150515134815
MDC IDC SET LEADCHNL LV PACING AMPLITUDE: 4.5 V
MDC IDC SET LEADCHNL LV PACING PULSEWIDTH: 0.7 ms
MDC IDC SET ZONE DETECTION INTERVAL: 250 ms
MDC IDC STAT BRADY AP VP PERCENT: 19.05 %
Zone Setting Detection Interval: 300 ms
Zone Setting Detection Interval: 350 ms
Zone Setting Detection Interval: 450 ms

## 2013-06-16 LAB — BASIC METABOLIC PANEL
BUN: 29 mg/dL — ABNORMAL HIGH (ref 6–23)
CALCIUM: 9.6 mg/dL (ref 8.4–10.5)
CO2: 29 mEq/L (ref 19–32)
Chloride: 101 mEq/L (ref 96–112)
Creatinine, Ser: 1.1 mg/dL (ref 0.4–1.2)
GFR: 53.13 mL/min — AB (ref >60.00)
Glucose, Bld: 91 mg/dL (ref 70–99)
Potassium: 4.9 mEq/L (ref 3.5–5.1)
SODIUM: 138 meq/L (ref 135–145)

## 2013-06-16 NOTE — Progress Notes (Signed)
Limited Echocardiogram performed with AV optimization.

## 2013-06-17 LAB — PRO B NATRIURETIC PEPTIDE: PRO B NATRI PEPTIDE: 44.32 pg/mL (ref <451)

## 2013-06-19 ENCOUNTER — Encounter: Payer: Self-pay | Admitting: Internal Medicine

## 2013-06-19 ENCOUNTER — Telehealth: Payer: Self-pay | Admitting: *Deleted

## 2013-06-19 NOTE — Telephone Encounter (Signed)
Advised patient of lab results  

## 2013-06-19 NOTE — Telephone Encounter (Signed)
Message copied by Earvin Hansen on Mon Jun 19, 2013  8:54 AM ------      Message from: Larey Dresser      Created: Sun Jun 18, 2013 10:31 PM       ok ------

## 2013-06-21 ENCOUNTER — Encounter (HOSPITAL_COMMUNITY): Payer: Self-pay

## 2013-06-21 ENCOUNTER — Ambulatory Visit (HOSPITAL_COMMUNITY)
Admission: RE | Admit: 2013-06-21 | Discharge: 2013-06-21 | Disposition: A | Discharge status: Home / Self Care | Payer: Medicare HMO | Source: Ambulatory Visit | Attending: Cardiology | Admitting: Cardiology

## 2013-06-21 VITALS — BP 106/54 | HR 74 | Wt 185.0 lb

## 2013-06-21 DIAGNOSIS — E785 Hyperlipidemia, unspecified: Secondary | ICD-10-CM | POA: Insufficient documentation

## 2013-06-21 DIAGNOSIS — F32A Depression, unspecified: Secondary | ICD-10-CM

## 2013-06-21 DIAGNOSIS — M549 Dorsalgia, unspecified: Secondary | ICD-10-CM | POA: Insufficient documentation

## 2013-06-21 DIAGNOSIS — Z8249 Family history of ischemic heart disease and other diseases of the circulatory system: Secondary | ICD-10-CM | POA: Insufficient documentation

## 2013-06-21 DIAGNOSIS — I5022 Chronic systolic (congestive) heart failure: Secondary | ICD-10-CM | POA: Insufficient documentation

## 2013-06-21 DIAGNOSIS — I509 Heart failure, unspecified: Secondary | ICD-10-CM | POA: Insufficient documentation

## 2013-06-21 DIAGNOSIS — F329 Major depressive disorder, single episode, unspecified: Secondary | ICD-10-CM

## 2013-06-21 DIAGNOSIS — M1990 Unspecified osteoarthritis, unspecified site: Secondary | ICD-10-CM | POA: Insufficient documentation

## 2013-06-21 DIAGNOSIS — I428 Other cardiomyopathies: Secondary | ICD-10-CM | POA: Insufficient documentation

## 2013-06-21 DIAGNOSIS — F3289 Other specified depressive episodes: Secondary | ICD-10-CM

## 2013-06-21 MED ORDER — FUROSEMIDE 20 MG PO TABS: 40.0000 mg | ORAL_TABLET | Freq: Every day | ORAL | Status: DC

## 2013-06-21 NOTE — Progress Notes (Signed)
Patient ID: Heather Smith, female   DOB: [birth year], 80 y.o.   MRN: [id number] PCP: Dr. Kenton Kingfisher Cardiology: Caryl Comes, [place]  80 yo with history of nonischemic cardiomyopathy presents for CHF clinic evaluation.  Patient has felt like her energy level has been low for about 2 years now.  In 9/14, she was admitted to [facility] with chest pain.  Cardiac cath was done, showing no significant coronary disease.  However, echo showed EF 30-35%.  She was started on meds for CHF, and echo was repeated in 12/14, EF was read as 20-25%.  She had a Medtonic CRT-D device placed (had baseline LBBB).  In 3/15, the LV lead was not capturing properly so device had to be reprogrammed.  She had AV optimization in 5/15.  Limited echo at that time showed improvement in EF to 40-45%.  CPX in 5/15 was submaximal but suggested low normal to mildly reduced functional capacity.    She was started on Lasix at last appointment.  She feels like she has a bit more energy but is not markedly changed.  She can walk for about 5 minutes before feeling dyspneic.  She is short of breath with inclines and stairs.  No chest pain. No orthopnea or PND. Weight is stable. She still feels like she has too much fluid.   Optivol was checked today.  Fluid index is below threshold and stable.  Fluid index dropped and impedence rose after starting Lasix at last appointment.   Labs (9/14): TSH normal Labs (3/15): K 4.7, creatinine 1.1 Labs (5/15): K 4.9, creatinine 1.1, BNP 44  PMH: 1. Chronic systolic CHF: Nonischemic cardiomyopathy.  LHC (9/14) with no significant coronary disease, EF 25-30% with global hypokinesis.  Echo (9/14) with EF 30-35%, moderate LV dilation, diffuse hypokinesis.  Echo (12/14) with EF 20-25%.  She does not drink ETOH.  She had Medtronic CRT-D device implantation in 1/15. Echo (5/15) with EF 40-45%, mild LVH.  CPX (5/15) was submaximal with RER 0.99, peak VO2 14, VE/VCO2 37.3; low normal functional capacity may be limited by  loss of BiV pacing at higher HR and by deconditioning but interpretation somewhat limited since study was submaximal.  2. Back pain s/p lumbar laminectomy 3. OA 4. Hyperlipidemia 5. Depression  SH: Lives [place], married, never smoked, no ETOH.    FH: Mother with MI at 40, possible ischemic cardiomyopathy.  Father with cardiomyopathy, died at 50.  She has 5 brothers, none with significant coronary disease.    ROS: All systems reviewed and negative except as per HPI.   Current Outpatient Prescriptions  Medication Sig Dispense Refill  . Ascorbic Acid (VITAMIN C) 1000 MG tablet Take 1,000 mg by mouth at bedtime.       . Biotin (BIOTIN 5000) 5 MG CAPS Take 5,000 mg by mouth daily.      . carvedilol (COREG) 6.25 MG tablet Take 1 tablet (6.25 mg total) by mouth 2 (two) times daily with a meal.  60 tablet  11  . cholecalciferol (VITAMIN D) 1000 UNITS tablet Take 1,000 Units by mouth daily.        . citalopram (CELEXA) 10 MG tablet Take 2 tablets (20 mg total) by mouth daily.  60 tablet  1  . furosemide (LASIX) 20 MG tablet Take 2 tablets (40 mg total) by mouth daily.  30 tablet  3  . levothyroxine (SYNTHROID, LEVOTHROID) 50 MCG tablet Take 50 mcg by mouth daily.        Marland Kitchen lisinopril (PRINIVIL,ZESTRIL)  20 MG tablet Take 1 tab in AM and 1/2 tab in PM  45 tablet  3  . Magnesium Oxide (MAG-200 PO) Take by mouth.      . multivitamin (THERAGRAN) per tablet Take 1 tablet by mouth daily.        Marland Kitchen omeprazole (PRILOSEC) 20 MG capsule Take 40 mg by mouth daily.       Marland Kitchen spironolactone (ALDACTONE) 25 MG tablet Take 1 tablet (25 mg total) by mouth daily.  30 tablet  11  . traMADol (ULTRAM) 50 MG tablet Take 1 tablet by mouth every 12 (twelve) hours as needed for moderate pain.       . vitamin E 400 UNIT capsule Take 400 Units by mouth daily.       No current facility-administered medications for this encounter.    BP 106/54  Pulse 74  Wt 185 lb (83.915 kg)  SpO2 94% General: NAD Neck: JVP 7-8  cm, no thyromegaly or thyroid nodule.  Lungs: Clear to auscultation bilaterally with normal respiratory effort. CV: Nondisplaced PMI.  Heart regular S1/S2, no S3/S4, 2/6 HSM at apex.  No peripheral edema.  No carotid bruit.  Normal pedal pulses.  Abdomen: Soft, nontender, no hepatosplenomegaly, no distention.  Skin: Intact without lesions or rashes.  Neurologic: Alert and oriented x 3.  Psych: Normal affect. Extremities: No clubbing or cyanosis.   Assessment/Plan: 1. Chronic systolic CHF: Nonischemic cardiomyopathy.  She does not use ETOH or drugs.  She does have a family history of cardiomyopathy of uncertain etiology (father) but none of her siblings developed a cardiomyopathy.  Familial cardiomyopathy is a consideration.  Viral myocarditis is also a possible etiology.   NYHA class II-III symptoms.  Optivol suggests improved volume status though she still feels like she has fluid on board.  CPX suggests low normal to mildly decreased functional capacity though it was a submaximal study.  Most recent echo in 5/15 (limited study) showed some improvement in EF (40-45%).   - I will try increasing Lasix to 40 daily x 4 days.  We will call her at that time.  If she feels better symptomatically, continue Lasix 40 daily.  If no effect on symptoms, decrease back to 20 mg daily.  She will need BMET/BNP in 2 wks.   - Continue current lisinopril and spironolactone (will not titrate lisinopril with high normal K).  - Continue current Coreg, will try increasing Coreg at next appointment.  - CPX did not suggest markedly reduced functional capacity.   - Low sodium diet was counseled.  - I encouraged her to increase her exercise level.   2. Depression:  She is on Celexa.    Larey Dresser 06/21/2013

## 2013-06-21 NOTE — Patient Instructions (Signed)
Increase Furosemide (Lasix) to 40 mg daily for 4 days, call us on Monday and let us know if you are feeling better on higher dose or not  Your physician discussed the importance of regular exercise and recommended that you increase your exercise program (walking) for good health.  Labs in 2 weeks  Your physician recommends that you schedule a follow-up appointment in: 1 month

## 2013-07-05 ENCOUNTER — Ambulatory Visit (INDEPENDENT_AMBULATORY_CARE_PROVIDER_SITE_OTHER): Payer: Commercial Managed Care - HMO

## 2013-07-05 DIAGNOSIS — I5022 Chronic systolic (congestive) heart failure: Secondary | ICD-10-CM

## 2013-07-05 LAB — BASIC METABOLIC PANEL
BUN: 37 mg/dL — ABNORMAL HIGH (ref 6–23)
CO2: 29 meq/L (ref 19–32)
Calcium: 9.6 mg/dL (ref 8.4–10.5)
Chloride: 102 mEq/L (ref 96–112)
Creatinine, Ser: 1.4 mg/dL — ABNORMAL HIGH (ref 0.4–1.2)
GFR: 38.22 mL/min — ABNORMAL LOW (ref >60.00)
Glucose, Bld: 90 mg/dL (ref 70–99)
Potassium: 5.1 mEq/L (ref 3.5–5.1)
Sodium: 138 mEq/L (ref 135–145)

## 2013-07-05 NOTE — Addendum Note (Signed)
Encounter addended by: Scarlette Calico, RN on: 07/05/2013 10:49 AM<BR>     Documentation filed: Orders

## 2013-07-06 LAB — PRO B NATRIURETIC PEPTIDE: Pro B Natriuretic peptide (BNP): 39.15 pg/mL (ref <451)

## 2013-07-12 ENCOUNTER — Encounter: Payer: Self-pay | Admitting: Cardiology

## 2013-07-19 ENCOUNTER — Ambulatory Visit (HOSPITAL_COMMUNITY)
Admission: RE | Admit: 2013-07-19 | Discharge: 2013-07-19 | Disposition: A | Discharge status: Home / Self Care | Payer: Medicare HMO | Source: Ambulatory Visit | Attending: Anesthesiology | Admitting: Anesthesiology

## 2013-07-19 ENCOUNTER — Ambulatory Visit (HOSPITAL_COMMUNITY)
Admission: RE | Admit: 2013-07-19 | Discharge: 2013-07-19 | Disposition: A | Discharge status: Home / Self Care | Payer: Medicare HMO | Source: Ambulatory Visit | Attending: Internal Medicine | Admitting: Internal Medicine

## 2013-07-19 ENCOUNTER — Encounter (HOSPITAL_COMMUNITY): Payer: Self-pay

## 2013-07-19 VITALS — BP 125/64 | HR 68 | Resp 18 | Wt 184.2 lb

## 2013-07-19 DIAGNOSIS — Z45018 Encounter for adjustment and management of other part of cardiac pacemaker: Secondary | ICD-10-CM | POA: Insufficient documentation

## 2013-07-19 DIAGNOSIS — I5022 Chronic systolic (congestive) heart failure: Secondary | ICD-10-CM

## 2013-07-19 DIAGNOSIS — F329 Major depressive disorder, single episode, unspecified: Secondary | ICD-10-CM | POA: Insufficient documentation

## 2013-07-19 DIAGNOSIS — F3289 Other specified depressive episodes: Secondary | ICD-10-CM | POA: Insufficient documentation

## 2013-07-19 DIAGNOSIS — Z79899 Other long term (current) drug therapy: Secondary | ICD-10-CM | POA: Insufficient documentation

## 2013-07-19 DIAGNOSIS — I428 Other cardiomyopathies: Secondary | ICD-10-CM | POA: Insufficient documentation

## 2013-07-19 DIAGNOSIS — Z95810 Presence of automatic (implantable) cardiac defibrillator: Secondary | ICD-10-CM | POA: Insufficient documentation

## 2013-07-19 DIAGNOSIS — E785 Hyperlipidemia, unspecified: Secondary | ICD-10-CM | POA: Insufficient documentation

## 2013-07-19 DIAGNOSIS — I509 Heart failure, unspecified: Secondary | ICD-10-CM | POA: Insufficient documentation

## 2013-07-19 DIAGNOSIS — T82198A Other mechanical complication of other cardiac electronic device, initial encounter: Secondary | ICD-10-CM

## 2013-07-19 LAB — BASIC METABOLIC PANEL
BUN: 40 mg/dL — ABNORMAL HIGH (ref 6–23)
CALCIUM: 9.4 mg/dL (ref 8.4–10.5)
CO2: 23 mEq/L (ref 19–32)
Chloride: 104 mEq/L (ref 96–112)
Creatinine, Ser: 1.53 mg/dL — ABNORMAL HIGH (ref 0.50–1.10)
GFR calc Af Amer: 36 mL/min — ABNORMAL LOW (ref >90)
GFR, EST NON AFRICAN AMERICAN: 31 mL/min — AB (ref >90)
Glucose, Bld: 99 mg/dL (ref 70–99)
Potassium: 5.3 mEq/L (ref 3.7–5.3)
Sodium: 140 mEq/L (ref 137–147)

## 2013-07-19 MED ORDER — CARVEDILOL 6.25 MG PO TABS: ORAL_TABLET | ORAL | Status: DC

## 2013-07-19 NOTE — Progress Notes (Signed)
Patient ID: Heather Smith, female   DOB: [birth year], 80 y.o.   MRN: [id number]  PCP: Dr. Kenton Kingfisher Cardiology: Caryl Comes, [place]  80 yo with history of nonischemic cardiomyopathy and chronic systolic HF.  Patient has felt like her energy level has been low for about 2 years now.  In 9/14, she was admitted to [facility] with chest pain.  Cardiac cath was done, showing no significant coronary disease.  However, echo showed EF 30-35%.  She was started on meds for CHF, and echo was repeated in 12/14, EF was read as 20-25%.  She had a Medtonic CRT-D device placed (had baseline LBBB).  In 3/15, the LV lead was not capturing properly so device had to be reprogrammed.  She had AV optimization in 5/15.  Limited echo at that time showed improvement in EF to 40-45%.  CPX in 5/15 was submaximal but suggested low normal to mildly reduced functional capacity.    Follow up for Heart Failure: Last visit increased lasix to 40 mg x 4 days and then to continue is she felt better. She reported she felt better but then labs checked and Cr elevated to 1.4 and lasix cut back to 20 mg daily. Patient reports fatigued. She was started on Lasix at last appointment.  She feels like she has a bit more energy but is not markedly changed.  She can walk for about 5 minutes before feeling dyspneic.  She is short of breath with inclines and stairs.  No chest pain. No orthopnea or PND. Weight is stable. She still feels like she has too much fluid.   Optivol was checked today.  Fluid index is below threshold and stable. Thoracic impedence up. No afib. Patient activity ~ 3 hrs a day. Drop in Biv pacing.  Labs (9/14): TSH normal Labs (3/15): K 4.7, creatinine 1.1 Labs (5/15): K 4.9, creatinine 1.1, BNP 44 Labs (6/15): K 5.1, creatinine 1.4, pro-BNP 39  PMH: 1. Chronic systolic CHF: Nonischemic cardiomyopathy.  LHC (9/14) with no significant coronary disease, EF 25-30% with global hypokinesis.  Echo (9/14) with EF 30-35%, moderate LV  dilation, diffuse hypokinesis.  Echo (12/14) with EF 20-25%.  She does not drink ETOH.  She had Medtronic CRT-D device implantation in 1/15. Echo (5/15) with EF 40-45%, mild LVH.  CPX (5/15) was submaximal with RER 0.99, peak VO2 14, VE/VCO2 37.3; low normal functional capacity may be limited by loss of BiV pacing at higher HR and by deconditioning but interpretation somewhat limited since study was submaximal.  2. Back pain s/p lumbar laminectomy 3. OA 4. Hyperlipidemia 5. Depression  SH: Lives [place], married, never smoked, no ETOH.    FH: Mother with MI at 35, possible ischemic cardiomyopathy.  Father with cardiomyopathy, died at 13.  She has 5 brothers, none with significant coronary disease.    ROS: All systems reviewed and negative except as per HPI.   Current Outpatient Prescriptions  Medication Sig Dispense Refill  . Ascorbic Acid (VITAMIN C) 1000 MG tablet Take 1,000 mg by mouth at bedtime.       . Biotin (BIOTIN 5000) 5 MG CAPS Take 5,000 mg by mouth daily.      . carvedilol (COREG) 6.25 MG tablet Take 1 tablet (6.25 mg total) by mouth 2 (two) times daily with a meal.  60 tablet  11  . cholecalciferol (VITAMIN D) 1000 UNITS tablet Take 1,000 Units by mouth daily.        . citalopram (CELEXA) 10 MG tablet Take  2 tablets (20 mg total) by mouth daily.  60 tablet  1  . furosemide (LASIX) 20 MG tablet Take 20 mg by mouth daily.      Marland Kitchen levothyroxine (SYNTHROID, LEVOTHROID) 50 MCG tablet Take 50 mcg by mouth daily.        Marland Kitchen lisinopril (PRINIVIL,ZESTRIL) 20 MG tablet Take 1 tab in AM and 1/2 tab in PM  45 tablet  3  . Magnesium Oxide (MAG-200 PO) Take by mouth.      . multivitamin (THERAGRAN) per tablet Take 1 tablet by mouth daily.        Marland Kitchen omeprazole (PRILOSEC) 20 MG capsule Take 40 mg by mouth daily.       Marland Kitchen spironolactone (ALDACTONE) 25 MG tablet Take 1 tablet (25 mg total) by mouth daily.  30 tablet  11  . traMADol (ULTRAM) 50 MG tablet Take 1 tablet by mouth every 12  (twelve) hours as needed for moderate pain.       . vitamin E 400 UNIT capsule Take 400 Units by mouth daily.       No current facility-administered medications for this encounter.   Filed Vitals:   07/19/13 1444  BP: 125/64  Pulse: 68  Resp: 18  Weight: 184 lb 4 oz (83.575 kg)  SpO2: 98%    General: NAD, husband present Neck: JVP 7-8 cm, no thyromegaly or thyroid nodule.  Lungs: Clear to auscultation bilaterally with normal respiratory effort. CV: Nondisplaced PMI.  Heart regular S1/S2, no S3/S4, 2/6 HSM at apex.  No peripheral edema.  No carotid bruit.  Normal pedal pulses.  Abdomen: Soft, nontender, no hepatosplenomegaly, no distention.  Skin: Intact without lesions or rashes.  Neurologic: Alert and oriented x 3.  Psych: Normal affect. Extremities: No clubbing or cyanosis.   Assessment/Plan: 1. Chronic systolic CHF: Nonischemic cardiomyopathy s/p CRT-D. EF 40-45% (06/2013)  She does not use ETOH or drugs.  She does have a family history of cardiomyopathy of uncertain etiology (father) but none of her siblings developed a cardiomyopathy.  Familial cardiomyopathy is a consideration.  Viral myocarditis is also a possible etiology.  - NYHA III symptoms and volume status stable. Optivol below threshold and thoracic impedence up. Continue lasix 20 mg daily. Check BMET today. - CPX suggests low normal to mildly decreased functional capacity though it was a submaximal study.  - remains very fatigued. Will cut back coreg to 3.125 mg q am and 6.25 mg q pm. - Concern for lead dislodgement on CRT-D. She has lost pacing per Optivol report. Dr. Haroldine Laws contacted Dr. Caryl Comes and we will get CXR today and he will see her next week.  - Continue current lisinopril and spironolactone (will not titrate lisinopril with high normal K).  - Reinforced the need and importance of daily weights, a low sodium diet, and fluid restriction (less than 2 L a day). Instructed to call the HF clinic if weight increases  more than 3 lbs overnight or 5 lbs in a week.  2. Depression: Continue celexa 40 mg daily.    F/U 6 weeks Rande Brunt NP-C 07/19/2013  Patient seen and examined with Junie Bame, NP. We discussed all aspects of the encounter. I agree with the assessment and plan as stated above. HF relatively stable though now with evidence of CRT lead dislodgement.We will get CXR to further evaluate.  I spoke with Dr. Caryl Comes who will get her scheduled to be seen in [facility] to adjust. Will not titrate HF meds at this  time.  CPX test reviewed.   Daniel Bensimhon,MD 11:09 PM

## 2013-07-19 NOTE — Patient Instructions (Signed)
Cut your coreg back to 3.125 mg (1/2 tablet) in the morning and 6.25 mg (1 tablet) in the evening.  Follow up with Dr. Caryl Comes  Follow up in clinic 6 weeks.  Do the following things EVERYDAY: 1) Weigh yourself in the morning before breakfast. Write it down and keep it in a log. 2) Take your medicines as prescribed 3) Eat low salt foods-Limit salt (sodium) to 2000 mg per day.  4) Stay as active as you can everyday 5) Limit all fluids for the day to less than 2 liters 6)

## 2013-07-20 ENCOUNTER — Telehealth: Payer: Self-pay | Admitting: Internal Medicine

## 2013-07-20 ENCOUNTER — Telehealth (HOSPITAL_COMMUNITY): Payer: Self-pay

## 2013-07-20 ENCOUNTER — Encounter (HOSPITAL_COMMUNITY): Payer: Commercial Managed Care - HMO

## 2013-07-20 NOTE — Telephone Encounter (Signed)
Attempted to reach patient regarding lab results.  Needs to hold lasix x 2 days, then reduce to 20mg  once daily.  Will attempt to recall later today. Renee Pain

## 2013-07-20 NOTE — Telephone Encounter (Signed)
New Message:   PT states she [facility] and they told her she needs an appt next week w/ Dr. Caryl Comes. Dr. Caryl Comes does not have a free appt until July 31st. Pt does not want to wait that long. Pt is requesting she be worked in next week and wants to speak with Sherri. States something is wrong with her defib.

## 2013-07-20 NOTE — Telephone Encounter (Signed)
Instructed to hold lasix x 2 days, then resumse at 20mg  once daily.  Labs rechecked next week at [facility] appointment.  Aware and agreeable. Renee Pain

## 2013-07-20 NOTE — Telephone Encounter (Signed)
Pt scheduled to see Dr. Caryl Comes on 6/24 at 4pm.  Pt is agreeable to this.

## 2013-07-26 ENCOUNTER — Encounter: Payer: Self-pay | Admitting: Internal Medicine

## 2013-07-26 ENCOUNTER — Ambulatory Visit (INDEPENDENT_AMBULATORY_CARE_PROVIDER_SITE_OTHER): Payer: Commercial Managed Care - HMO | Admitting: Internal Medicine

## 2013-07-26 ENCOUNTER — Encounter: Payer: Self-pay | Admitting: Cardiovascular Disease

## 2013-07-26 ENCOUNTER — Encounter: Payer: Self-pay | Admitting: Anesthesiology

## 2013-07-26 ENCOUNTER — Ambulatory Visit (INDEPENDENT_AMBULATORY_CARE_PROVIDER_SITE_OTHER): Payer: Commercial Managed Care - HMO | Admitting: Cardiovascular Disease

## 2013-07-26 VITALS — BP 126/80 | HR 70 | Ht 67.5 in | Wt 183.4 lb

## 2013-07-26 VITALS — BP 126/80 | HR 70 | Ht 67.0 in | Wt 183.0 lb

## 2013-07-26 DIAGNOSIS — I5022 Chronic systolic (congestive) heart failure: Secondary | ICD-10-CM

## 2013-07-26 DIAGNOSIS — I10 Essential (primary) hypertension: Secondary | ICD-10-CM

## 2013-07-26 DIAGNOSIS — E785 Hyperlipidemia, unspecified: Secondary | ICD-10-CM

## 2013-07-26 LAB — MDC_IDC_ENUM_SESS_TYPE_INCLINIC
Battery Remaining Longevity: 64 mo
Battery Voltage: 2.97 V
Brady Statistic AS VP Percent: 92.45 %
Brady Statistic RA Percent Paced: 5.76 %
Brady Statistic RV Percent Paced: 95.82 %
Date Time Interrogation Session: 20150624201847
HIGH POWER IMPEDANCE MEASURED VALUE: 247 Ohm
HIGH POWER IMPEDANCE MEASURED VALUE: 72 Ohm
Lead Channel Impedance Value: 646 Ohm
Lead Channel Pacing Threshold Amplitude: 0.5 V
Lead Channel Pacing Threshold Pulse Width: 0.4 ms
Lead Channel Pacing Threshold Pulse Width: 0.4 ms
Lead Channel Pacing Threshold Pulse Width: 0.7 ms
Lead Channel Sensing Intrinsic Amplitude: 13.625 mV
Lead Channel Sensing Intrinsic Amplitude: 3.625 mV
Lead Channel Setting Pacing Amplitude: 1.5 V
Lead Channel Setting Pacing Amplitude: 2 V
Lead Channel Setting Pacing Pulse Width: 0.7 ms
Lead Channel Setting Sensing Sensitivity: 0.3 mV
MDC IDC MSMT LEADCHNL LV PACING THRESHOLD AMPLITUDE: 2.75 V
MDC IDC MSMT LEADCHNL RA IMPEDANCE VALUE: 513 Ohm
MDC IDC MSMT LEADCHNL RA PACING THRESHOLD AMPLITUDE: 0.625 V
MDC IDC MSMT LEADCHNL RA SENSING INTR AMPL: 3.5 mV
MDC IDC MSMT LEADCHNL RV SENSING INTR AMPL: 16 mV
MDC IDC SET LEADCHNL LV PACING AMPLITUDE: 3.5 V
MDC IDC SET LEADCHNL RV PACING PULSEWIDTH: 0.4 ms
MDC IDC SET ZONE DETECTION INTERVAL: 300 ms
MDC IDC SET ZONE DETECTION INTERVAL: 350 ms
MDC IDC SET ZONE DETECTION INTERVAL: 450 ms
MDC IDC STAT BRADY AP VP PERCENT: 5.61 %
MDC IDC STAT BRADY AP VS PERCENT: 0.14 %
MDC IDC STAT BRADY AS VS PERCENT: 1.79 %
Zone Setting Detection Interval: 250 ms

## 2013-07-26 NOTE — Assessment & Plan Note (Signed)
She has chronic systolic congestive heart failure. He has a left bundle branch block and has had a biventricular ICD placed. Unfortunately, the LV lead is not working properly. She's been seen in the heart failure clinic and will be following up with him. She's also been seen by Dr. Caryl Comes. I'll see her in 6 months for followup visit. We'll plan on getting fasting lipids, liver enzymes, and basic metabolic profile at that time.

## 2013-07-26 NOTE — Patient Instructions (Signed)
Your physician wants you to follow-up in:  6 MONTHS WITH  DR KLEIN  You will receive a reminder letter in the mail two months in advance. If you don't receive a letter, please call our office to schedule the follow-up appointment. Your physician recommends that you continue on your current medications as directed. Please refer to the Current Medication list given to you today.  

## 2013-07-26 NOTE — Progress Notes (Signed)
[facility]. [address], [place]  [address] Phone: [phone] Fax:  [phone]  Date:  07/26/2013   ID:  Heather Smith, DOB [birth year], MRN [id number]  PCP:  Shirline Frees, MD  Cardiologist:  Dr. Liam Rogers    Problem List 1. Acute on chronic systlic CHf- normal cors by cath 2. LBBB 3. HTN 4. Hypothyroidism  History of Present Illness: Heather Smith is a 80 y.o. female who returns for follow up after a recent admission to the hospital with CP and SOB.   She has a hx of normal cardiac catheterization in the 1990s, HTN, HL, LBBB, hypothyroidism, GERD.  Myoview 4/14: Low risk, no ischemia, small, fixed mild apical septal perfusion defect; not gated. She was admitted 9/1-9/3 after presenting with chest pain, dyspnea and near syncope. She ruled out for myocardial infarction by enzymes. LHC 10/04/12: Normal coronary arteries, EF 25-30% with global HK.  Echocardiogram 10/05/12: EF 30-35%, diffuse HK, moderate LAE.  She was diagnosed with nonischemic cardiomyopathy. Medications were adjusted.  Since d/c she has been fatigued.  No CP.  No significant dyspnea.  She is NYHA Class II.  No orthopnea, PND, edema.   No syncope.  She had had more pain with DJD since having to stop Meloxicam.    Nov. 13, 2014:  She is feeling poorly - fatigued.  Also is having lots of arthritis problems because she has not been able to take her arthritis meds because of her systolic chf.  Dec. 16, 6962:  Heather Smith  Continues to have dyspnea.  Recent echo shows:  Persistently depressed left ventricular systolic function with an ejection fraction of 20-25%.  She is till limited in her activities.  She has some mild lightheadedness.   April 20, 2013:  She has had a Bi-V ICD placed Jan. 15, 2015.   She does not feel all that much better since the ICD placement.  She feels about the same.   July 26, 2013:  Heather Smith is not feeling well.  She has malfunction of her LV lead. She has had  persistent NYHA class II - III.    Wt Readings from Last 3 Encounters:  07/26/13 183 lb 6.4 oz (83.19 kg)  07/26/13 183 lb (83.008 kg)  07/19/13 184 lb 4 oz (83.575 kg)     Past Medical History  Diagnosis Date  . Hypertension   . Hypercholesterolemia   . Left bundle branch block   . Hypothyroidism   . Spinal stenosis   . GERD (gastroesophageal reflux disease)   . Vitamin D deficiency   . Neurogenic claudication due to lumbar spinal stenosis   . Chest pain     a. Nl cath in the 90's;  b. 04/2005: Myoview: subtle areao of ? reversibility in apical segment of anteromedial LV - ? attenuation, EF 54%;  c. 05/2012 MV: small, fixed mild apical septal perfusion defect, ? attenuation due to lbbb, no ischemia.  . Non-ischemic cardiomyopathy     LHC 10/04/12: Normal coronary arteries, EF 25-30% with global HK.  Echocardiogram 10/05/12: EF 30-35%, diffuse HK, moderate LAE. Echocardiogram (01/2013): Lateral to septal wall dyssynchrony EF 20-25%, diffuse HK  . Chronic systolic heart failure   . Automatic implantable cardioverter-defibrillator in situ   . CHF (congestive heart failure)   . Osteoarthritis     "about all over" (02/22/2013)  . LV lead partial dislodgment 05/30/2013  . Biventricular defibrillator  Medtronic 05/30/2013    Current Outpatient Prescriptions  Medication Sig Dispense Refill  . Ascorbic Acid (VITAMIN C) 1000 MG tablet Take 1,000 mg by mouth at bedtime.       . Biotin (BIOTIN 5000) 5 MG CAPS Take 5,000 mg by mouth daily.      . carvedilol (COREG) 6.25 MG tablet Take 3.125 mg (1/2 tablet) in the morning and 6.25 mg (1 tablet) in the evening.  45 tablet  6  . cholecalciferol (VITAMIN D) 1000 UNITS tablet Take 1,000 Units by mouth daily.        . citalopram (CELEXA) 10 MG tablet Take 2 tablets (20 mg total) by mouth daily.  60 tablet  1  . furosemide (LASIX) 20 MG tablet Take 20 mg by mouth daily.      Marland Kitchen levothyroxine (SYNTHROID, LEVOTHROID) 50 MCG tablet Take 50 mcg by mouth daily.         Marland Kitchen lisinopril (PRINIVIL,ZESTRIL) 20 MG tablet Take 1 tab in AM and 1/2 tab in PM  45 tablet  3  . Magnesium Oxide (MAG-200 PO) Take by mouth.      . multivitamin (THERAGRAN) per tablet Take 1 tablet by mouth daily.        Marland Kitchen omeprazole (PRILOSEC) 20 MG capsule Take 40 mg by mouth daily.       Marland Kitchen spironolactone (ALDACTONE) 25 MG tablet Take 1 tablet (25 mg total) by mouth daily.  30 tablet  11  . traMADol (ULTRAM) 50 MG tablet Take 1 tablet by mouth every 12 (twelve) hours as needed for moderate pain.       . vitamin E 400 UNIT capsule Take 400 Units by mouth daily.       No current facility-administered medications for this visit.    Allergies:    Allergies  Allergen Reactions  . Niaspan [Niacin Er]     High doses, causes hot flashes & aching   . Statins Other (See Comments)    "Makes her bones hurt and very sore"    Social History:  The patient  reports that she has never smoked. She has never used smokeless tobacco. She reports that she does not drink alcohol or use illicit drugs.   ROS:  Please see the history of present illness.   All other systems reviewed and negative.   PHYSICAL EXAM: VS:  BP 126/80  Pulse 70  Ht 5' 7.5" (1.715 m)  Wt 183 lb 6.4 oz (83.19 kg)  BMI 28.28 kg/m2 Well nourished, well developed, in no acute distress HEENT: normal Neck: no JVD Cardiac:  normal S1, S2; RRR; no murmur, ICD site looks good.   Lungs:  clear to auscultation bilaterally, no wheezing, rhonchi or rales Abd: soft, nontender, no hepatomegaly Ext: no edema; right wrist without hematoma or mass  Skin: warm and dry Neuro:  CNs 2-12 intact, no focal abnormalities noted  EKG:      ASSESSMENT AND PLAN:

## 2013-07-26 NOTE — Progress Notes (Signed)
ELECTROPHYSIOLOGY OFFICE NOTE  Patient ID: Heather Smith, MRN: [id number], DOB/AGE: 1932/08/10 80 y.o. Admit date: (Not on file) Date of Consult: 07/26/2013  Primary Physician: Shirline Frees, MD Primary Cardiologist [facility]  Chief Complaint:  ?  CRT   HPI Heather Smith is a 80 y.o. female  Seen in followup for CRT implantation for nonischemic cardiomyopathy and depressed LV function. This was accomplished fall 2014  Interval partial lead dislodgment noted after she saw Dr. Mariana Single in March and an x-ray was obtained and reviewed.  There is been no interval improvement in her symptoms of fatigue shortness of breath. She remains depressed. We started her on citalopram last time without any improvement.   There is no edema or nocturnal dyspnea.    Repeat CXR demonstrated no interval further dislodgment and reasonable position in the basilar segment of the posterolateral wall  Interval echo has shown EF>>40%     Past Medical History  Diagnosis Date  . Hypertension   . Hypercholesterolemia   . Left bundle branch block   . Hypothyroidism   . Spinal stenosis   . GERD (gastroesophageal reflux disease)   . Vitamin D deficiency   . Neurogenic claudication due to lumbar spinal stenosis   . Chest pain     a. Nl cath in the 90's;  b. 04/2005: Myoview: subtle areao of ? reversibility in apical segment of anteromedial LV - ? attenuation, EF 54%;  c. 05/2012 MV: small, fixed mild apical septal perfusion defect, ? attenuation due to lbbb, no ischemia.  . Non-ischemic cardiomyopathy     LHC 10/04/12: Normal coronary arteries, EF 25-30% with global HK.  Echocardiogram 10/05/12: EF 30-35%, diffuse HK, moderate LAE. Echocardiogram (01/2013): Lateral to septal wall dyssynchrony EF 20-25%, diffuse HK  . Chronic systolic heart failure   . Automatic implantable cardioverter-defibrillator in situ   . CHF (congestive heart failure)   . Osteoarthritis     "about all over" (02/22/2013)   . LV lead partial dislodgment 05/30/2013  . Biventricular defibrillator  Medtronic 05/30/2013      Surgical History:  Past Surgical History  Procedure Laterality Date  . Tubal ligation    . Foot arthrodesis, modified mcbride Bilateral   . Knee arthroscopy Left   . Knee arthroscopy Right   . Cardiac catheterization  2000  . Lumbar laminectomy  11/03/2006     Bilateral 3, 4, 5 laminectomy, partial L2, decompression of the thecal sac, foraminotomy, posterolateral arthrodesis L3-S1 with autograft   -- SURGEON:  Leeroy Cha, M.D.   . Cardiac defibrillator placement  02/22/2013    with LV lead placement and HV lead testing/notes 02/22/2013  . Appendectomy    . Excisional hemorrhoidectomy    . Tonsillectomy    . Blepharoplasty Bilateral   . Vaginal hysterectomy       Home Meds: Prior to Admission medications   Medication Sig Start Date End Date Taking? Authorizing Tasia Liz  Ascorbic Acid (VITAMIN C) 1000 MG tablet Take 1,000 mg by mouth at bedtime.    Yes Historical Lasonia Casino, MD  Biotin (BIOTIN 5000) 5 MG CAPS Take 5,000 mg by mouth daily.   Yes Historical Reese Stockman, MD  carvedilol (COREG) 6.25 MG tablet Take 1 tablet (6.25 mg total) by mouth 2 (two) times daily with a meal. 10/05/12  Yes Rhonda G Barrett, PA-C  cholecalciferol (VITAMIN D) 1000 UNITS tablet Take 1,000 Units by mouth daily.     Yes Historical Schyler Counsell, MD  levothyroxine (SYNTHROID, LEVOTHROID) 50 MCG tablet  Take 50 mcg by mouth daily.     Yes Historical Jaiya Mooradian, MD  lisinopril (PRINIVIL,ZESTRIL) 20 MG tablet Take 1 tablet (20 mg total) by mouth 2 (two) times daily. 12/15/12  Yes Thayer Headings, MD  Magnesium 250 MG TABS Take 250 mg by mouth daily.    Yes Historical Toniqua Melamed, MD  multivitamin [facility]) per tablet Take 1 tablet by mouth daily.     Yes Historical Coalton Arch, MD  omeprazole (PRILOSEC) 20 MG capsule Take 40 mg by mouth daily.    Yes Historical Grier Czerwinski, MD  spironolactone (ALDACTONE) 25 MG tablet Take 0.5  tablets (12.5 mg total) by mouth daily. 10/05/12  Yes Rhonda G Barrett, PA-C  traMADol (ULTRAM) 50 MG tablet Take 1 tablet by mouth as needed. 10/06/12  Yes Historical Brigit Doke, MD  vitamin E 400 UNIT capsule Take 400 Units by mouth daily.   Yes Historical Mertha Clyatt, MD    Inpatient Medications:     Allergies:  Allergies  Allergen Reactions  . Niaspan [Niacin Er]     High doses, causes hot flashes & aching   . Statins Other (See Comments)    "Makes her bones hurt and very sore"    History   Social History  . Marital Status: Married    Spouse Name: N/A    Number of Children: N/A  . Years of Education: N/A   Occupational History  . Not on file.   Social History Main Topics  . Smoking status: Never Smoker   . Smokeless tobacco: Never Used  . Alcohol Use: No  . Drug Use: No  . Sexual Activity: Not Currently   Other Topics Concern  . Not on file   Social History Narrative   Lives in [place], [place] with husband.  Active around the house.     Family History  Problem Relation Age of Onset  . Heart attack Mother     died @ 47.  . Thyroid disease Mother   . Lung cancer Mother   . Hyperlipidemia Mother   . Aneurysm Father     died @ 77.  . Aneurysm Brother     has had an Aneurysm x3  . Colon cancer Brother   . Bone cancer Brother   . Lung cancer Brother   . Stomach cancer Brother      ROS:  Please see the history of present illness.    All other systems reviewed and negative.    Physical Exam: Blood pressure 126/80, pulse 70, height 5\' 7"  (1.702 m), weight 183 lb (83.008 kg). Well developed and nourished in no acute distress HENT normal Neck supple with JVP-flat Carotids brisk and full without bruits Clear Device pocket well healed; without hematoma or erythema.  There is no tethering  Regular rate and rhythm, no murmurs or gallops Abd-soft with active BS without hepatomegaly No Clubbing cyanosis edema Skin-warm and dry A & Oriented  Grossly normal sensory and  motor function     Labs: Cardiac Enzymes No results found for this basename: CKTOTAL, CKMB, TROPONINI,  in the last 72 hours CBC Lab Results  Component Value Date   WBC 4.1* 02/17/2013   HGB 12.4 02/17/2013   HCT 35.7* 02/17/2013   MCV 90.5 02/17/2013   PLT 173.0 02/17/2013   PROTIME: No results found for this basename: LABPROT, INR,  in the last 72 hours Chemistry No results found for this basename: NA, K, CL, CO2, BUN, CREATININE, CALCIUM, LABALBU, PROT, BILITOT, ALKPHOS, ALT, AST, GLUCOSE,  in the  last 168 hours Lipids Lab Results  Component Value Date   CHOL 175 08/22/2010   HDL 51.50 08/22/2010   LDLCALC 95 08/22/2010   TRIG 142.0 08/22/2010   BNP Pro B Natriuretic peptide (BNP)  Date/Time Value Ref Range Status  07/05/2013 10:59 AM 39.15  <451 pg/mL Final  06/16/2013 11:17 AM 44.32  <451 pg/mL Final   Miscellaneous No results found for this basename: DDIMER    Radiology/Studies:  No results found.  EKG: Sinus rhythm  P. synchronous pacing intervals 14/14/46   Assessment and Plan:    Nonischemic cardiac myopathy  Left bundle branch block  Congestive heart failure-chronic-systolic-class III  Sinus bradycardia  PVCs  Dizziness  CRT-D.-Medtronic  Lead dislodgement  Depression  She continues with class 2-3 symptoms. There is also significant fatigue. We had reprogrammed her device to capture pacing from the most distal tips because of lead dislodgment    we undertook  an echo to look for AV optimization and EF was improved   THe lead position is adequate , and i dont think there is any way to predict an improved response with lead repositioning. Further there is concurrent significant improvement in LV function which may be causally related to the lead position. I have told her i would reposition the lead if she were insistent, but would do so reluctantly  Her CPX also makes me less eager to undertake this given the low normalfunctional capacity even with a  limited/submaximal study  She has been treated with  citalopram but without signifincant improvement  May need assistance from PCP for alternative antidepressant therapy  I have recommended [facility] for counseling support  We spent almost an hour     Virl Axe

## 2013-07-26 NOTE — Patient Instructions (Signed)

## 2013-07-26 NOTE — Assessment & Plan Note (Addendum)
BP is ok Continue current meds.  WE may have some room to increase her coreg and Lisinopril .  I would like to get her BI-V pacer working properly first.

## 2013-07-27 ENCOUNTER — Encounter: Payer: Self-pay | Admitting: Internal Medicine

## 2013-08-30 ENCOUNTER — Ambulatory Visit (HOSPITAL_COMMUNITY)
Admission: RE | Admit: 2013-08-30 | Discharge: 2013-08-30 | Disposition: A | Discharge status: Home / Self Care | Payer: Medicare HMO | Source: Ambulatory Visit | Attending: Cardiology | Admitting: Cardiology

## 2013-08-30 ENCOUNTER — Encounter (HOSPITAL_COMMUNITY): Payer: Self-pay

## 2013-08-30 VITALS — BP 112/58 | HR 74 | Resp 16 | Wt 183.1 lb

## 2013-08-30 DIAGNOSIS — M549 Dorsalgia, unspecified: Secondary | ICD-10-CM | POA: Insufficient documentation

## 2013-08-30 DIAGNOSIS — E785 Hyperlipidemia, unspecified: Secondary | ICD-10-CM | POA: Diagnosis not present

## 2013-08-30 DIAGNOSIS — F32A Depression, unspecified: Secondary | ICD-10-CM

## 2013-08-30 DIAGNOSIS — F3289 Other specified depressive episodes: Secondary | ICD-10-CM | POA: Diagnosis not present

## 2013-08-30 DIAGNOSIS — F329 Major depressive disorder, single episode, unspecified: Secondary | ICD-10-CM | POA: Diagnosis not present

## 2013-08-30 DIAGNOSIS — I10 Essential (primary) hypertension: Secondary | ICD-10-CM

## 2013-08-30 DIAGNOSIS — I5022 Chronic systolic (congestive) heart failure: Secondary | ICD-10-CM

## 2013-08-30 DIAGNOSIS — I428 Other cardiomyopathies: Secondary | ICD-10-CM | POA: Insufficient documentation

## 2013-08-30 DIAGNOSIS — M1990 Unspecified osteoarthritis, unspecified site: Secondary | ICD-10-CM | POA: Diagnosis not present

## 2013-08-30 DIAGNOSIS — I509 Heart failure, unspecified: Secondary | ICD-10-CM | POA: Insufficient documentation

## 2013-08-30 LAB — BASIC METABOLIC PANEL
Anion gap: 14 (ref 5–15)
BUN: 51 mg/dL — AB (ref 6–23)
CALCIUM: 9.3 mg/dL (ref 8.4–10.5)
CO2: 26 mEq/L (ref 19–32)
CREATININE: 2.43 mg/dL — AB (ref 0.50–1.10)
Chloride: 100 mEq/L (ref 96–112)
GFR calc Af Amer: 21 mL/min — ABNORMAL LOW (ref >90)
GFR, EST NON AFRICAN AMERICAN: 18 mL/min — AB (ref >90)
Glucose, Bld: 87 mg/dL (ref 70–99)
Potassium: 5.6 mEq/L — ABNORMAL HIGH (ref 3.7–5.3)
Sodium: 140 mEq/L (ref 137–147)

## 2013-08-30 MED ORDER — FUROSEMIDE 20 MG PO TABS: 20.0000 mg | ORAL_TABLET | ORAL | Status: DC | PRN

## 2013-08-30 NOTE — Progress Notes (Signed)
Patient ID: QUANNA WITTKE, female   DOB: [birth year], 80 y.o.   MRN: [id number]  PCP: Dr. Kenton Kingfisher Cardiology: Caryl Comes, Nahser  80 yo with history of nonischemic cardiomyopathy and chronic systolic HF.  Patient has felt like her energy level has been low for about 2 years now.  In 9/14, she was admitted to [facility] with chest pain.  Cardiac cath was done, showing no significant coronary disease.  However, echo showed EF 30-35%.  She was started on meds for CHF, and echo was repeated in 12/14, EF was read as 20-25%.  She had a Medtonic CRT-D device placed (had baseline LBBB).  In 3/15, the LV lead was not capturing properly so device had to be reprogrammed.  She had AV optimization in 5/15.  Limited echo at that time showed improvement in EF to 40-45%.  CPX in 5/15 was submaximal but suggested low normal to mildly reduced functional capacity.    Follow up for Heart Failure: Saw Dr. Caryl Comes for concerns of lead dislodgement, however he did not feel as if predict an improved response with lead repositioning. Overall does not feel that great and just feels blah. When she bends over has dizziness and feels like she can pass out. +SOB with minimal activity. Has not been walking any in the past couple of weeks because after she is just completely worn out. Denies CP, orthopnea or PND. Not able to go upstairs easily. Following a low salt diet and drinking less than 2L a day.   Labs (9/14): TSH normal Labs (3/15): K 4.7, creatinine 1.1 Labs (5/15): K 4.9, creatinine 1.1, BNP 44 Labs (6/15): K 5.1, creatinine 1.4, pro-BNP 39 Labs (6/15): K 5.3, creatinine 1.53  PMH: 1. Chronic systolic CHF: Nonischemic cardiomyopathy.  LHC (9/14) with no significant coronary disease, EF 25-30% with global hypokinesis.  Echo (9/14) with EF 30-35%, moderate LV dilation, diffuse hypokinesis.  Echo (12/14) with EF 20-25%.  She does not drink ETOH.  She had Medtronic CRT-D device implantation in 1/15. Echo (5/15) with EF 40-45%,  mild LVH.  CPX (5/15) was submaximal with RER 0.99, peak VO2 14, VE/VCO2 37.3; low normal functional capacity may be limited by loss of BiV pacing at higher HR and by deconditioning but interpretation somewhat limited since study was submaximal.  2. Back pain s/p lumbar laminectomy 3. OA 4. Hyperlipidemia 5. Depression  SH: [place], married, never smoked, no ETOH.    FH: Mother with MI at 52, possible ischemic cardiomyopathy.  Father with cardiomyopathy, died at 46.  She has 5 brothers, none with significant coronary disease.    ROS: All systems reviewed and negative except as per HPI.   Current Outpatient Prescriptions  Medication Sig Dispense Refill  . Ascorbic Acid (VITAMIN C) 1000 MG tablet Take 1,000 mg by mouth at bedtime.       . Biotin (BIOTIN 5000) 5 MG CAPS Take 5,000 mg by mouth daily.      . carvedilol (COREG) 6.25 MG tablet Take 3.125 mg (1/2 tablet) in the morning and 6.25 mg (1 tablet) in the evening.  45 tablet  6  . cholecalciferol (VITAMIN D) 1000 UNITS tablet Take 1,000 Units by mouth daily.        . citalopram (CELEXA) 10 MG tablet Take 2 tablets (20 mg total) by mouth daily.  60 tablet  1  . furosemide (LASIX) 20 MG tablet Take 20 mg by mouth daily.      Marland Kitchen levothyroxine (SYNTHROID, LEVOTHROID) 50 MCG tablet  Take 50 mcg by mouth daily.        Marland Kitchen lisinopril (PRINIVIL,ZESTRIL) 20 MG tablet Take 1 tab in AM and 1/2 tab in PM  45 tablet  3  . Magnesium Oxide (MAG-200 PO) Take by mouth.      . multivitamin (THERAGRAN) per tablet Take 1 tablet by mouth daily.        Marland Kitchen omeprazole (PRILOSEC) 20 MG capsule Take 40 mg by mouth daily.       Marland Kitchen spironolactone (ALDACTONE) 25 MG tablet Take 1 tablet (25 mg total) by mouth daily.  30 tablet  11  . traMADol (ULTRAM) 50 MG tablet Take 1 tablet by mouth every 12 (twelve) hours as needed for moderate pain.       . vitamin E 400 UNIT capsule Take 400 Units by mouth daily.       No current facility-administered medications for  this encounter.   Filed Vitals:   08/30/13 1441  BP: 112/58  Pulse: 74  Resp: 16  Weight: 183 lb 2 oz (83.065 kg)  SpO2: 94%    General: NAD, husband present Neck: JVP flat, no thyromegaly or thyroid nodule.  Lungs: Clear to auscultation bilaterally with normal respiratory effort. CV: Nondisplaced PMI.  Heart regular S1/S2, no S3/S4, 2/6 HSM at apex.  No peripheral edema.  No carotid bruit.  Normal pedal pulses.  Abdomen: Soft, nontender, no hepatosplenomegaly, no distention.  Skin: Intact without lesions or rashes.  Neurologic: Alert and oriented x 3.  Psych: Normal affect. Extremities: No clubbing or cyanosis.   Assessment/Plan:  1. Chronic systolic CHF: Nonischemic cardiomyopathy s/p CRT-D. EF 40-45% (06/2013). She does have a family history of cardiomyopathy of uncertain etiology (father) but none of her siblings developed a cardiomyopathy.  Familial cardiomyopathy is a consideration.  Viral myocarditis is also a possible etiology.  - NYHA III symptoms and volume status stable. Optivol below threshold and thoracic impedence up. Patient activity ~2-3 hours a day. No afib. Will change lasix to PRN for weight gain of 3 lbs in a day or 5 lbs in week.  - CPX suggests low normal to mildly decreased functional capacity though it was a submaximal study, could be related to deconditioning. - Continue coreg 3.125 mg qam and 6.25 mg q pm. Will not titrate with fatigue and dizziness. - Continue current lisinopril and spironolactone (will not titrate lisinopril with high normal K).  - Saw Dr.Klein about AV optimization and he did not feel as if he could improve response with lead repositioning.  - Could consider RHC next time if patient is still having symptoms. We discussed that there are risks and possible complications with this since invasive, but if she felt like she wanted to know defintely that her heart is doing well we could go down this route.  - Reinforced the need and importance of  daily weights, a low sodium diet, and fluid restriction (less than 2 L a day). Instructed to call the HF clinic if weight increases more than 3 lbs overnight or 5 lbs in a week.  2. Depression: Continue celexa 40 mg daily. We had a lengthy conversation today that I am concerned that some of her SOB and fatigue is related to anxiety and deconditioning. Patient admits to being depressed and difficult to adjust to having HF. Encouraged her to try and start exercising and to call PCP about possibly adjusting medications. Also gave her the option of talking to our CSW.    F/U 3 months Boyce Medici,  Deatra Canter B NP-C 08/30/2013

## 2013-08-30 NOTE — Patient Instructions (Signed)
Doing great from Heart Failure standpoint.  Stop lasix and just take as needed. Take if your weight increases more than 3 lbs in a day or 5 lbs in a week.  Try to start walking a little each day and increase by a little each week.  Call if dizziness gets worse.  Call Primary Care Doctor to discuss possibly changing medications for depression/anxiety.  Call if you would like to set up a time to talk to our Education officer, museum.   F/U 3 months  Do the following things EVERYDAY: 1) Weigh yourself in the morning before breakfast. Write it down and keep it in a log. 2) Take your medicines as prescribed 3) Eat low salt foods-Limit salt (sodium) to 2000 mg per day.  4) Stay as active as you can everyday 5) Limit all fluids for the day to less than 2 liters 6)

## 2013-09-04 ENCOUNTER — Ambulatory Visit (HOSPITAL_COMMUNITY)
Admission: RE | Admit: 2013-09-04 | Discharge: 2013-09-04 | Disposition: A | Discharge status: Home / Self Care | Payer: Medicare HMO | Source: Ambulatory Visit | Attending: Internal Medicine | Admitting: Internal Medicine

## 2013-09-04 DIAGNOSIS — I5022 Chronic systolic (congestive) heart failure: Secondary | ICD-10-CM | POA: Insufficient documentation

## 2013-09-04 LAB — BASIC METABOLIC PANEL
Anion gap: 11 (ref 5–15)
BUN: 28 mg/dL — ABNORMAL HIGH (ref 6–23)
CO2: 27 meq/L (ref 19–32)
Calcium: 9.2 mg/dL (ref 8.4–10.5)
Chloride: 105 mEq/L (ref 96–112)
Creatinine, Ser: 1.41 mg/dL — ABNORMAL HIGH (ref 0.50–1.10)
GFR calc Af Amer: 40 mL/min — ABNORMAL LOW (ref >90)
GFR calc non Af Amer: 34 mL/min — ABNORMAL LOW (ref >90)
GLUCOSE: 121 mg/dL — AB (ref 70–99)
POTASSIUM: 4.3 meq/L (ref 3.7–5.3)
SODIUM: 143 meq/L (ref 137–147)

## 2013-09-19 ENCOUNTER — Telehealth (HOSPITAL_COMMUNITY): Payer: Self-pay | Admitting: *Deleted

## 2013-09-19 DIAGNOSIS — I10 Essential (primary) hypertension: Secondary | ICD-10-CM

## 2013-09-19 DIAGNOSIS — I5023 Acute on chronic systolic (congestive) heart failure: Secondary | ICD-10-CM

## 2013-09-19 MED ORDER — LISINOPRIL 20 MG PO TABS: 20.0000 mg | ORAL_TABLET | Freq: Every day | ORAL | Status: DC

## 2013-09-19 NOTE — Telephone Encounter (Signed)
Pt called to f/u on lab results, she states she was told to hold Lis and Arlyce Harman and repeat labs, but she has not heard back whether to restart meds or not, reviewed with Junie Bame, NP she would like for pt to restart Lis 20 mg daily and recheck bmet in 1 week, pt is aware and agreeable, recheck lab 8/25

## 2013-09-26 ENCOUNTER — Ambulatory Visit (HOSPITAL_COMMUNITY)
Admission: RE | Admit: 2013-09-26 | Discharge: 2013-09-26 | Disposition: A | Discharge status: Home / Self Care | Payer: Commercial Managed Care - HMO | Source: Ambulatory Visit | Attending: Cardiology | Admitting: Cardiology

## 2013-09-26 ENCOUNTER — Telehealth (HOSPITAL_COMMUNITY): Payer: Self-pay | Admitting: Anesthesiology

## 2013-09-26 ENCOUNTER — Encounter (HOSPITAL_COMMUNITY): Payer: Self-pay

## 2013-09-26 VITALS — Wt 190.1 lb

## 2013-09-26 DIAGNOSIS — I509 Heart failure, unspecified: Secondary | ICD-10-CM | POA: Insufficient documentation

## 2013-09-26 DIAGNOSIS — I5023 Acute on chronic systolic (congestive) heart failure: Secondary | ICD-10-CM

## 2013-09-26 DIAGNOSIS — I10 Essential (primary) hypertension: Secondary | ICD-10-CM

## 2013-09-26 DIAGNOSIS — I5022 Chronic systolic (congestive) heart failure: Secondary | ICD-10-CM | POA: Diagnosis present

## 2013-09-26 LAB — BASIC METABOLIC PANEL
Anion gap: 12 (ref 5–15)
BUN: 15 mg/dL (ref 6–23)
CO2: 24 meq/L (ref 19–32)
Calcium: 9.4 mg/dL (ref 8.4–10.5)
Chloride: 103 mEq/L (ref 96–112)
Creatinine, Ser: 0.97 mg/dL (ref 0.50–1.10)
GFR calc Af Amer: 63 mL/min — ABNORMAL LOW (ref >90)
GFR, EST NON AFRICAN AMERICAN: 54 mL/min — AB (ref >90)
GLUCOSE: 86 mg/dL (ref 70–99)
Potassium: 4.8 mEq/L (ref 3.7–5.3)
SODIUM: 139 meq/L (ref 137–147)

## 2013-09-26 LAB — PRO B NATRIURETIC PEPTIDE: PRO B NATRI PEPTIDE: 57.1 pg/mL (ref 0–450)

## 2013-09-26 MED ORDER — FUROSEMIDE 20 MG PO TABS: 20.0000 mg | ORAL_TABLET | ORAL | Status: DC

## 2013-09-26 MED ORDER — LISINOPRIL 20 MG PO TABS: 20.0000 mg | ORAL_TABLET | Freq: Two times a day (BID) | ORAL | Status: DC

## 2013-09-26 NOTE — Addendum Note (Signed)
Encounter addended by: Renee Pain, RN on: 09/26/2013  9:43 AM<BR>     Documentation filed: Orders, Notes Section, Vitals Section

## 2013-09-26 NOTE — Telephone Encounter (Signed)
Labs were stable. Creatinine much improved. Patient was originally on lisinopril 40 mg daily, spironolactone 25 mg daily and lasix 20 mg daily.   She current is not on spironolactone and is just on lisinopril 20 mg daily. Will increase lisinopril back to 20 mg BID and continue to hold spiro. Weight is up 5-7 lbs and instructed to take lasix 20 mg M, W, F and repeat labs next Tuesday.  Patient aware.   Junie Bame B 2:25 PM

## 2013-09-26 NOTE — Progress Notes (Signed)
Weight up 5-7lbs from baseline.  Labs drawn.  Patient took 1 spiro 25mg  tablet yesterday and another today.  Will follow up with NP and further instruct patient fromt here once labs resulted. Renee Pain

## 2013-09-27 ENCOUNTER — Encounter: Payer: Self-pay | Admitting: Internal Medicine

## 2013-10-03 ENCOUNTER — Ambulatory Visit (HOSPITAL_COMMUNITY)
Admission: RE | Admit: 2013-10-03 | Discharge: 2013-10-03 | Disposition: A | Discharge status: Home / Self Care | Payer: Medicare HMO | Source: Ambulatory Visit | Attending: Cardiology | Admitting: Cardiology

## 2013-10-03 DIAGNOSIS — I5022 Chronic systolic (congestive) heart failure: Secondary | ICD-10-CM | POA: Diagnosis not present

## 2013-10-03 DIAGNOSIS — I509 Heart failure, unspecified: Secondary | ICD-10-CM | POA: Diagnosis not present

## 2013-10-03 LAB — BASIC METABOLIC PANEL
ANION GAP: 14 (ref 5–15)
BUN: 31 mg/dL — ABNORMAL HIGH (ref 6–23)
CHLORIDE: 104 meq/L (ref 96–112)
CO2: 26 meq/L (ref 19–32)
Calcium: 9.3 mg/dL (ref 8.4–10.5)
Creatinine, Ser: 1.32 mg/dL — ABNORMAL HIGH (ref 0.50–1.10)
GFR calc non Af Amer: 37 mL/min — ABNORMAL LOW (ref >90)
GFR, EST AFRICAN AMERICAN: 43 mL/min — AB (ref >90)
Glucose, Bld: 103 mg/dL — ABNORMAL HIGH (ref 70–99)
Potassium: 4.6 mEq/L (ref 3.7–5.3)
Sodium: 144 mEq/L (ref 137–147)

## 2013-10-25 ENCOUNTER — Encounter: Payer: Commercial Managed Care - HMO | Admitting: *Deleted

## 2013-10-25 ENCOUNTER — Telehealth: Payer: Self-pay | Admitting: Cardiology

## 2013-10-25 NOTE — Telephone Encounter (Signed)
Spoke with pt and reminded pt of remote transmission that is due today. Pt verbalized understanding.   

## 2013-10-26 ENCOUNTER — Encounter: Payer: Self-pay | Admitting: Cardiology

## 2013-11-03 ENCOUNTER — Ambulatory Visit (INDEPENDENT_AMBULATORY_CARE_PROVIDER_SITE_OTHER): Payer: Commercial Managed Care - HMO | Admitting: *Deleted

## 2013-11-03 DIAGNOSIS — I5022 Chronic systolic (congestive) heart failure: Secondary | ICD-10-CM

## 2013-11-03 DIAGNOSIS — I428 Other cardiomyopathies: Secondary | ICD-10-CM

## 2013-11-03 DIAGNOSIS — I429 Cardiomyopathy, unspecified: Secondary | ICD-10-CM

## 2013-11-03 NOTE — Progress Notes (Signed)
Remote ICD transmission.   

## 2013-11-06 LAB — MDC_IDC_ENUM_SESS_TYPE_REMOTE
Battery Remaining Longevity: 39 mo
Brady Statistic AP VP Percent: 7.57 %
Brady Statistic AS VS Percent: 2.86 %
Brady Statistic RV Percent Paced: 94.6 %
Date Time Interrogation Session: 20151002193732
HighPow Impedance: 209 Ohm
HighPow Impedance: 66 Ohm
Lead Channel Impedance Value: 456 Ohm
Lead Channel Impedance Value: 703 Ohm
Lead Channel Pacing Threshold Amplitude: 0.5 V
Lead Channel Pacing Threshold Amplitude: 0.625 V
Lead Channel Pacing Threshold Amplitude: 3.75 V
Lead Channel Pacing Threshold Pulse Width: 0.4 ms
Lead Channel Pacing Threshold Pulse Width: 0.7 ms
Lead Channel Sensing Intrinsic Amplitude: 3.375 mV
Lead Channel Setting Pacing Amplitude: 1.5 V
Lead Channel Setting Pacing Amplitude: 2 V
Lead Channel Setting Sensing Sensitivity: 0.3 mV
MDC IDC MSMT BATTERY VOLTAGE: 2.96 V
MDC IDC MSMT LEADCHNL RA SENSING INTR AMPL: 3.375 mV
MDC IDC MSMT LEADCHNL RV PACING THRESHOLD PULSEWIDTH: 0.4 ms
MDC IDC MSMT LEADCHNL RV SENSING INTR AMPL: 16.25 mV
MDC IDC MSMT LEADCHNL RV SENSING INTR AMPL: 16.25 mV
MDC IDC SET LEADCHNL LV PACING AMPLITUDE: 4.75 V
MDC IDC SET LEADCHNL LV PACING PULSEWIDTH: 0.7 ms
MDC IDC SET LEADCHNL RV PACING PULSEWIDTH: 0.4 ms
MDC IDC SET ZONE DETECTION INTERVAL: 450 ms
MDC IDC STAT BRADY AP VS PERCENT: 0.22 %
MDC IDC STAT BRADY AS VP PERCENT: 89.35 %
MDC IDC STAT BRADY RA PERCENT PACED: 7.79 %
Zone Setting Detection Interval: 250 ms
Zone Setting Detection Interval: 300 ms
Zone Setting Detection Interval: 350 ms

## 2013-11-21 ENCOUNTER — Encounter: Payer: Self-pay | Admitting: Cardiology

## 2013-11-30 ENCOUNTER — Encounter (HOSPITAL_COMMUNITY): Payer: Self-pay

## 2013-11-30 ENCOUNTER — Ambulatory Visit (HOSPITAL_COMMUNITY)
Admission: RE | Admit: 2013-11-30 | Discharge: 2013-11-30 | Disposition: A | Discharge status: Home / Self Care | Payer: Medicare HMO | Source: Ambulatory Visit | Attending: Internal Medicine | Admitting: Internal Medicine

## 2013-11-30 VITALS — BP 136/64 | HR 64 | Resp 18 | Wt 189.2 lb

## 2013-11-30 DIAGNOSIS — I5022 Chronic systolic (congestive) heart failure: Secondary | ICD-10-CM

## 2013-11-30 DIAGNOSIS — F329 Major depressive disorder, single episode, unspecified: Secondary | ICD-10-CM

## 2013-11-30 DIAGNOSIS — F32A Depression, unspecified: Secondary | ICD-10-CM

## 2013-11-30 LAB — BASIC METABOLIC PANEL
Anion gap: 11 (ref 5–15)
BUN: 18 mg/dL (ref 6–23)
CALCIUM: 9.1 mg/dL (ref 8.4–10.5)
CO2: 27 meq/L (ref 19–32)
Chloride: 103 mEq/L (ref 96–112)
Creatinine, Ser: 0.92 mg/dL (ref 0.50–1.10)
GFR calc Af Amer: 67 mL/min — ABNORMAL LOW (ref >90)
GFR calc non Af Amer: 58 mL/min — ABNORMAL LOW (ref >90)
GLUCOSE: 90 mg/dL (ref 70–99)
Potassium: 4.2 mEq/L (ref 3.7–5.3)
Sodium: 141 mEq/L (ref 137–147)

## 2013-11-30 LAB — PRO B NATRIURETIC PEPTIDE: Pro B Natriuretic peptide (BNP): 87.3 pg/mL (ref 0–450)

## 2013-11-30 NOTE — Patient Instructions (Addendum)
Doing great.  Have a wonderful Christmas and New Year.  Call any issues.  Take 20 mg of lasix today and Saturday and then go back to M/W/F.   Do the following things EVERYDAY: 1) Weigh yourself in the morning before breakfast. Write it down and keep it in a log. 2) Take your medicines as prescribed 3) Eat low salt foods-Limit salt (sodium) to 2000 mg per day.  4) Stay as active as you can everyday 5) Limit all fluids for the day to less than 2 liters 6)

## 2013-11-30 NOTE — Progress Notes (Signed)
Patient ID: Heather Smith, female   DOB: 1932/05/19, 80 y.o.   MRN: [id number]  PCP: Dr. Kenton Kingfisher Cardiology: Caryl Comes, Nahser  80 yo with history of nonischemic cardiomyopathy and chronic systolic HF.  Patient has felt like her energy level has been low for about 2 years now.  In 9/14, she was admitted to [facility] with chest pain.  Cardiac cath was done, showing no significant coronary disease.  However, echo showed EF 30-35%.  She was started on meds for CHF, and echo was repeated in 12/14, EF was read as 20-25%.  She had a Medtonic CRT-D device placed (had baseline LBBB).  In 3/15, the LV lead was not capturing properly so device had to be reprogrammed.  She had AV optimization in 5/15.  Limited echo at that time showed improvement in EF to 40-45%.  CPX in 5/15 was submaximal but suggested low normal to mildly reduced functional capacity.    Follow up for Heart Failure: Since last visit was supposed to increase lisinopril to 20 mg BID, however is only taking 20 mg daily. Feeling somewhat better. Denies SOB, CP or orthopnea. +  Minimal LE edema. Still gets SOB with going up stairs and SOB after walking 1/4 mile. Weight at home 184 lbs. No dizziness. Following a low salt diet and drinking less than 2L a day.   Labs (9/14): TSH normal Labs (3/15): K 4.7, creatinine 1.1 Labs (5/15): K 4.9, creatinine 1.1, BNP 44 Labs (6/15): K 5.1, creatinine 1.4, pro-BNP 39 Labs (6/15): K 5.3, creatinine 1.53  PMH: 1. Chronic systolic CHF: Nonischemic cardiomyopathy.  LHC (9/14) with no significant coronary disease, EF 25-30% with global hypokinesis.  Echo (9/14) with EF 30-35%, moderate LV dilation, diffuse hypokinesis.  Echo (12/14) with EF 20-25%.  She does not drink ETOH.  She had Medtronic CRT-D device implantation in 1/15. Echo (5/15) with EF 40-45%, mild LVH.  CPX (5/15) was submaximal with RER 0.99, peak VO2 14, VE/VCO2 37.3; low normal functional capacity may be limited by loss of BiV pacing at higher HR and  by deconditioning but interpretation somewhat limited since study was submaximal.  2. Back pain s/p lumbar laminectomy 3. OA 4. Hyperlipidemia 5. Depression  SH: Lives [place], married, never smoked, no ETOH.    FH: Mother with MI at 29, possible ischemic cardiomyopathy.  Father with cardiomyopathy, died at 33.  She has 5 brothers, none with significant coronary disease.    ROS: All systems reviewed and negative except as per HPI.   Current Outpatient Prescriptions  Medication Sig Dispense Refill  . Ascorbic Acid (VITAMIN C) 1000 MG tablet Take 1,000 mg by mouth at bedtime.       . carvedilol (COREG) 6.25 MG tablet Take 3.125 mg (1/2 tablet) in the morning and 6.25 mg (1 tablet) in the evening.  45 tablet  6  . cholecalciferol (VITAMIN D) 1000 UNITS tablet Take 1,000 Units by mouth daily.        . citalopram (CELEXA) 10 MG tablet Take 2 tablets (20 mg total) by mouth daily.  60 tablet  1  . furosemide (LASIX) 20 MG tablet Take 1 tablet (20 mg total) by mouth every Monday, Wednesday, and Friday.  12 tablet  3  . levothyroxine (SYNTHROID, LEVOTHROID) 50 MCG tablet Take 50 mcg by mouth daily.        Marland Kitchen lisinopril (PRINIVIL,ZESTRIL) 20 MG tablet Take 1 tablet (20 mg total) by mouth 2 (two) times daily.  60 tablet  3  .  Magnesium Oxide (MAG-200 PO) Take by mouth.      . multivitamin (THERAGRAN) per tablet Take 1 tablet by mouth daily.        Marland Kitchen omeprazole (PRILOSEC) 20 MG capsule Take 40 mg by mouth daily.       . traMADol (ULTRAM) 50 MG tablet Take 1 tablet by mouth every 12 (twelve) hours as needed for moderate pain.       . vitamin E 400 UNIT capsule Take 400 Units by mouth daily.      . Biotin (BIOTIN 5000) 5 MG CAPS Take 5,000 mg by mouth daily.       No current facility-administered medications for this encounter.   Filed Vitals:   11/30/13 1457  BP: 136/64  Pulse: 64  Resp: 18  Weight: 189 lb 4 oz (85.843 kg)  SpO2: 98%    General: NAD, husband present Neck: JVP 8-9,  no thyromegaly or thyroid nodule.  Lungs: Clear to auscultation bilaterally with normal respiratory effort. CV: Nondisplaced PMI.  Heart regular S1/S2, no S3/S4, 2/6 HSM at apex.  No peripheral edema.  No carotid bruit.  Normal pedal pulses.  Abdomen: Soft, nontender, no hepatosplenomegaly, no distention.  Skin: Intact without lesions or rashes.  Neurologic: Alert and oriented x 3.  Psych: Normal affect. Extremities: No clubbing or cyanosis.   Assessment/Plan:  1. Chronic systolic CHF: Nonischemic cardiomyopathy s/p CRT-D. EF 40-45% (06/2013). She does have a family history of cardiomyopathy of uncertain etiology (father) but none of her siblings developed a cardiomyopathy.  Familial cardiomyopathy is a consideration.  Viral myocarditis is also a possible etiology.  - NYHA II-IIIsymptoms and volume status mildly elevated. She currently is taking lasix 20 mg on M/W/F. Will have her take a dose today and Saturday and then go back to M/W/F. Optivol shows fluid above threshold and thoracic impedence down. Patient activity ~3-4 hrs a day.  - Check BMET today.  - Continue coreg 3.125 mg qam and 6.25 mg q pm.  - Continue current lisinopril and spironolactone.  - Reinforced the need and importance of daily weights, a low sodium diet, and fluid restriction (less than 2 L a day). Instructed to call the HF clinic if weight increases more than 3 lbs overnight or 5 lbs in a week.  2. Depression: Continue celexa 40 mg daily. Seems to be doing better since last visit.   F/U 4 months Rande Brunt NP-C 11/30/2013

## 2013-12-06 ENCOUNTER — Encounter: Payer: Self-pay | Admitting: Cardiology

## 2013-12-08 ENCOUNTER — Encounter: Payer: Self-pay | Admitting: Internal Medicine

## 2013-12-19 ENCOUNTER — Encounter: Payer: Self-pay | Admitting: Internal Medicine

## 2014-01-11 ENCOUNTER — Encounter (HOSPITAL_COMMUNITY): Payer: Self-pay | Admitting: Cardiology

## 2014-01-17 ENCOUNTER — Telehealth (HOSPITAL_COMMUNITY): Payer: Self-pay | Admitting: Vascular Surgery

## 2014-01-17 NOTE — Telephone Encounter (Signed)
Pt called for the 2 or 3 days pt has not been feeling well .Marland Kitchen Pt has been feeling Fatigue, tired, pt bp is low and she loss 10lbs.. Please advise

## 2014-01-18 NOTE — Telephone Encounter (Signed)
Patient c/o "not feeling right", hard to get specifics verbalized.  States she has lost 10 lbs in the past 2-3 weeks slowly over time, mostly because she states she does not have an appetite.  C/o cold sweats, nausea.  No vomiting, diarrhea noted.  Has not been seen or had any heart failure medications or diuretics altered in 2 months.  Denies dizziness, chest pain, and shortness of breath.  Says "when I lay down I can hear my heart beating".  When asked if this is new since other symptoms occurred, she says no that this has been going on for some time, that her heart is not racing our bounding but she can just hear it.  Advised to see if primary MD can fit her in as this may be viral in nature since she is not running fever but has "cold sweats, nausea, and no appetite".  No HF s/s noted.  Advised if they can not see her this week to seek care from urgent care or ED.  Added her on for next available appointment which is Monday at 10 am.  Junie Bame NP-C and patient aware and agreeable of plan.  Renee Pain

## 2014-01-22 ENCOUNTER — Encounter (HOSPITAL_COMMUNITY): Payer: Commercial Managed Care - HMO

## 2014-01-30 ENCOUNTER — Emergency Department (HOSPITAL_COMMUNITY)
Admission: EM | Admit: 2014-01-30 | Discharge: 2014-01-30 | Disposition: A | Discharge status: Home / Self Care | Payer: Commercial Managed Care - HMO | Attending: Emergency Medicine | Admitting: Emergency Medicine

## 2014-01-30 ENCOUNTER — Encounter (HOSPITAL_COMMUNITY): Payer: Self-pay | Admitting: Emergency Medicine

## 2014-01-30 ENCOUNTER — Emergency Department (HOSPITAL_COMMUNITY): Payer: Commercial Managed Care - HMO

## 2014-01-30 ENCOUNTER — Telehealth: Payer: Self-pay | Admitting: Cardiovascular Disease

## 2014-01-30 DIAGNOSIS — I10 Essential (primary) hypertension: Secondary | ICD-10-CM | POA: Diagnosis not present

## 2014-01-30 DIAGNOSIS — I5022 Chronic systolic (congestive) heart failure: Secondary | ICD-10-CM | POA: Diagnosis not present

## 2014-01-30 DIAGNOSIS — Z95810 Presence of automatic (implantable) cardiac defibrillator: Secondary | ICD-10-CM | POA: Diagnosis not present

## 2014-01-30 DIAGNOSIS — Z87828 Personal history of other (healed) physical injury and trauma: Secondary | ICD-10-CM | POA: Diagnosis not present

## 2014-01-30 DIAGNOSIS — Z8739 Personal history of other diseases of the musculoskeletal system and connective tissue: Secondary | ICD-10-CM | POA: Insufficient documentation

## 2014-01-30 DIAGNOSIS — E039 Hypothyroidism, unspecified: Secondary | ICD-10-CM | POA: Insufficient documentation

## 2014-01-30 DIAGNOSIS — R079 Chest pain, unspecified: Secondary | ICD-10-CM | POA: Diagnosis not present

## 2014-01-30 DIAGNOSIS — Z79899 Other long term (current) drug therapy: Secondary | ICD-10-CM | POA: Insufficient documentation

## 2014-01-30 DIAGNOSIS — E780 Pure hypercholesterolemia: Secondary | ICD-10-CM | POA: Diagnosis not present

## 2014-01-30 DIAGNOSIS — K219 Gastro-esophageal reflux disease without esophagitis: Secondary | ICD-10-CM | POA: Diagnosis not present

## 2014-01-30 DIAGNOSIS — R5381 Other malaise: Secondary | ICD-10-CM | POA: Diagnosis not present

## 2014-01-30 DIAGNOSIS — E559 Vitamin D deficiency, unspecified: Secondary | ICD-10-CM | POA: Diagnosis not present

## 2014-01-30 DIAGNOSIS — R531 Weakness: Secondary | ICD-10-CM | POA: Diagnosis not present

## 2014-01-30 DIAGNOSIS — Z9889 Other specified postprocedural states: Secondary | ICD-10-CM | POA: Diagnosis not present

## 2014-01-30 LAB — CBC WITH DIFFERENTIAL/PLATELET
BASOS ABS: 0 10*3/uL (ref 0.0–0.1)
BASOS PCT: 0 % (ref 0–1)
Eosinophils Absolute: 0.1 10*3/uL (ref 0.0–0.7)
Eosinophils Relative: 2 % (ref 0–5)
HCT: 41.5 % (ref 36.0–46.0)
Hemoglobin: 13.8 g/dL (ref 12.0–15.0)
Lymphocytes Relative: 27 % (ref 12–46)
Lymphs Abs: 1.9 10*3/uL (ref 0.7–4.0)
MCH: 29.8 pg (ref 26.0–34.0)
MCHC: 33.3 g/dL (ref 30.0–36.0)
MCV: 89.6 fL (ref 78.0–100.0)
MONO ABS: 0.7 10*3/uL (ref 0.1–1.0)
Monocytes Relative: 10 % (ref 3–12)
NEUTROS PCT: 61 % (ref 43–77)
Neutro Abs: 4.1 10*3/uL (ref 1.7–7.7)
Platelets: 153 10*3/uL (ref 150–400)
RBC: 4.63 MIL/uL (ref 3.87–5.11)
RDW: 13.5 % (ref 11.5–15.5)
WBC: 6.8 10*3/uL (ref 4.0–10.5)

## 2014-01-30 LAB — URINE MICROSCOPIC-ADD ON

## 2014-01-30 LAB — I-STAT TROPONIN, ED
TROPONIN I, POC: 0.01 ng/mL (ref 0.00–0.08)
Troponin i, poc: 0.09 ng/mL (ref 0.00–0.08)

## 2014-01-30 LAB — BASIC METABOLIC PANEL
Anion gap: 7 (ref 5–15)
BUN: 20 mg/dL (ref 6–23)
CALCIUM: 9.6 mg/dL (ref 8.4–10.5)
CHLORIDE: 104 meq/L (ref 96–112)
CO2: 30 mmol/L (ref 19–32)
CREATININE: 1.03 mg/dL (ref 0.50–1.10)
GFR calc Af Amer: 58 mL/min — ABNORMAL LOW (ref >90)
GFR calc non Af Amer: 50 mL/min — ABNORMAL LOW (ref >90)
GLUCOSE: 94 mg/dL (ref 70–99)
Potassium: 4.4 mmol/L (ref 3.5–5.1)
Sodium: 141 mmol/L (ref 135–145)

## 2014-01-30 LAB — URINALYSIS, ROUTINE W REFLEX MICROSCOPIC
BILIRUBIN URINE: NEGATIVE
Glucose, UA: NEGATIVE mg/dL
Hgb urine dipstick: NEGATIVE
Ketones, ur: NEGATIVE mg/dL
NITRITE: NEGATIVE
PROTEIN: NEGATIVE mg/dL
Specific Gravity, Urine: 1.027 (ref 1.005–1.030)
UROBILINOGEN UA: 0.2 mg/dL (ref 0.0–1.0)
pH: 5 (ref 5.0–8.0)

## 2014-01-30 LAB — PROTIME-INR
INR: 1 (ref 0.00–1.49)
PROTHROMBIN TIME: 13.3 s (ref 11.6–15.2)

## 2014-01-30 LAB — TROPONIN I: Troponin I: <0.03 ng/mL (ref <0.031)

## 2014-01-30 LAB — APTT: aPTT: 31 seconds (ref 24–37)

## 2014-01-30 LAB — BRAIN NATRIURETIC PEPTIDE: B NATRIURETIC PEPTIDE 5: 26.8 pg/mL (ref 0.0–100.0)

## 2014-01-30 NOTE — ED Notes (Signed)
Lilia Pro, RN at bedside to interrogate pacemaker.

## 2014-01-30 NOTE — ED Notes (Signed)
Opal Sidles, EMT collected blood.

## 2014-01-30 NOTE — ED Notes (Signed)
Pt c/o CP, nausea and SOB x 3 weeks that is not improving; pt sts some diaphoresis

## 2014-01-30 NOTE — ED Provider Notes (Signed)
Face-to-face evaluation   History: She presents for very mild chest pain, rated 1-2/10, intermittently over the last several weeks.  She also has a sensation of generalized malaise.  She has not had a defibrillator firings, near-syncope, or syncope.  She is taking her usual medications, without relief.   Physical exam: Alert, elderly female who appears depressed.  Regular rate and rhythm, no murmur.  Lungs clear to auscultation.  ED evaluation for cardiac ischemia, is neg. - Delta Troponin  Review of records indicate normal coronary arteries at catheter, September 2014.   Results for orders placed or performed during the hospital encounter of 03/47/42  Basic metabolic panel  Result Value Ref Range   Sodium 141 135 - 145 mmol/L   Potassium 4.4 3.5 - 5.1 mmol/L   Chloride 104 96 - 112 mEq/L   CO2 30 19 - 32 mmol/L   Glucose, Bld 94 70 - 99 mg/dL   BUN 20 6 - 23 mg/dL   Creatinine, Ser 1.03 0.50 - 1.10 mg/dL   Calcium 9.6 8.4 - 10.5 mg/dL   GFR calc non Af Amer 50 (L) >90 mL/min   GFR calc Af Amer 58 (L) >90 mL/min   Anion gap 7 5 - 15  BNP (order ONLY if patient complains of dyspnea/SOB AND you have documented it for THIS visit)  Result Value Ref Range   B Natriuretic Peptide 26.8 0.0 - 100.0 pg/mL  CBC with Differential  Result Value Ref Range   WBC 6.8 4.0 - 10.5 K/uL   RBC 4.63 3.87 - 5.11 MIL/uL   Hemoglobin 13.8 12.0 - 15.0 g/dL   HCT 41.5 36.0 - 46.0 %   MCV 89.6 78.0 - 100.0 fL   MCH 29.8 26.0 - 34.0 pg   MCHC 33.3 30.0 - 36.0 g/dL   RDW 13.5 11.5 - 15.5 %   Platelets 153 150 - 400 K/uL   Neutrophils Relative % 61 43 - 77 %   Neutro Abs 4.1 1.7 - 7.7 K/uL   Lymphocytes Relative 27 12 - 46 %   Lymphs Abs 1.9 0.7 - 4.0 K/uL   Monocytes Relative 10 3 - 12 %   Monocytes Absolute 0.7 0.1 - 1.0 K/uL   Eosinophils Relative 2 0 - 5 %   Eosinophils Absolute 0.1 0.0 - 0.7 K/uL   Basophils Relative 0 0 - 1 %   Basophils Absolute 0.0 0.0 - 0.1 K/uL  Protime-INR  Result  Value Ref Range   Prothrombin Time 13.3 11.6 - 15.2 seconds   INR 1.00 0.00 - 1.49  APTT  Result Value Ref Range   aPTT 31 24 - 37 seconds  Urinalysis, Routine w reflex microscopic  Result Value Ref Range   Color, Urine YELLOW YELLOW   APPearance CLEAR CLEAR   Specific Gravity, Urine 1.027 1.005 - 1.030   pH 5.0 5.0 - 8.0   Glucose, UA NEGATIVE NEGATIVE mg/dL   Hgb urine dipstick NEGATIVE NEGATIVE   Bilirubin Urine NEGATIVE NEGATIVE   Ketones, ur NEGATIVE NEGATIVE mg/dL   Protein, ur NEGATIVE NEGATIVE mg/dL   Urobilinogen, UA 0.2 0.0 - 1.0 mg/dL   Nitrite NEGATIVE NEGATIVE   Leukocytes, UA MODERATE (A) NEGATIVE  Troponin I  Result Value Ref Range   Troponin I <0.03 <0.031 ng/mL  Urine microscopic-add on  Result Value Ref Range   Squamous Epithelial / LPF FEW (A) RARE   WBC, UA 7-10 <3 WBC/hpf   RBC / HPF 0-2 <3 RBC/hpf   Bacteria, UA  RARE RARE   Urine-Other MUCOUS PRESENT   I-stat troponin, ED (not at [facility])  Result Value Ref Range   Troponin i, poc 0.09 (HH) 0.00 - 0.08 ng/mL   Comment NOTIFIED PHYSICIAN    Comment 3          I-stat troponin, ED  Result Value Ref Range   Troponin i, poc 0.01 0.00 - 0.08 ng/mL   Comment 3           Dg Chest 2 View  01/30/2014   CLINICAL DATA:  Chest pain and weight loss.  Difficulty breathing  EXAM: CHEST  2 VIEW  COMPARISON:  July 19, 2013  FINDINGS: There is no edema or consolidation. The heart size and pulmonary vascularity are normal. Pacemaker lead tips are attached to the right atrium, right ventricle, and left ventricle. Aorta is mildly tortuous but stable. No pneumothorax. No adenopathy. There is degenerative change in the thoracic spine.  IMPRESSION: No edema or consolidation.  No change in cardiac silhouette.   Electronically Signed   By: Lowella Grip M.D.   On: 01/30/2014 14:35    Nursing Notes Reviewed/ Care Coordinated Applicable Imaging Reviewed Interpretation of Laboratory Data incorporated into ED treatment  The  patient appears reasonably screened and/or stabilized for discharge and I doubt any other medical condition or other [facility] requiring further screening, evaluation, or treatment in the ED at this time prior to discharge.  Plan: Home Medications- usual; Home Treatments- rest, fluids; return here if the recommended treatment, does not improve the symptoms; Recommended follow up- PCP 1 week and prn  Medical screening examination/treatment/procedure(s) were conducted as a shared visit with non-physician practitioner(s) and myself.  I personally evaluated the patient during the encounter  Richarda Blade, MD 01/30/14 1659

## 2014-01-30 NOTE — ED Notes (Signed)
Troponin results given to the charge nurse [place]

## 2014-01-30 NOTE — Discharge Instructions (Signed)
Chest Pain (Nonspecific) °It is often hard to give a specific diagnosis for the cause of chest pain. There is always a chance that your pain could be related to something serious, such as a heart attack or a blood clot in the lungs. You need to follow up with your health care provider for further evaluation. °CAUSES  °· Heartburn. °· Pneumonia or bronchitis. °· Anxiety or stress. °· Inflammation around your heart (pericarditis) or lung (pleuritis or pleurisy). °· A blood clot in the lung. °· A collapsed lung (pneumothorax). It can develop suddenly on its own (spontaneous pneumothorax) or from trauma to the chest. °· Shingles infection (herpes zoster virus). °The chest wall is composed of bones, muscles, and cartilage. Any of these can be the source of the pain. °· The bones can be bruised by injury. °· The muscles or cartilage can be strained by coughing or overwork. °· The cartilage can be affected by inflammation and become sore (costochondritis). °DIAGNOSIS  °Lab tests or other studies may be needed to find the cause of your pain. Your health care provider may have you take a test called an ambulatory electrocardiogram (ECG). An ECG records your heartbeat patterns over a 24-hour period. You may also have other tests, such as: °· Transthoracic echocardiogram (TTE). During echocardiography, sound waves are used to evaluate how blood flows through your heart. °· Transesophageal echocardiogram (TEE). °· Cardiac monitoring. This allows your health care provider to monitor your heart rate and rhythm in real time. °· Holter monitor. This is a portable device that records your heartbeat and can help diagnose heart arrhythmias. It allows your health care provider to track your heart activity for several days, if needed. °· Stress tests by exercise or by giving medicine that makes the heart beat faster. °TREATMENT  °· Treatment depends on what may be causing your chest pain. Treatment may include: °· Acid blockers for  heartburn. °· Anti-inflammatory medicine. °· Pain medicine for inflammatory conditions. °· Antibiotics if an infection is present. °· You may be advised to change lifestyle habits. This includes stopping smoking and avoiding alcohol, caffeine, and chocolate. °· You may be advised to keep your head raised (elevated) when sleeping. This reduces the chance of acid going backward from your stomach into your esophagus. °Most of the time, nonspecific chest pain will improve within 2-3 days with rest and mild pain medicine.  °HOME CARE INSTRUCTIONS  °· If antibiotics were prescribed, take them as directed. Finish them even if you start to feel better. °· For the next few days, avoid physical activities that bring on chest pain. Continue physical activities as directed. °· Do not use any tobacco products, including cigarettes, chewing tobacco, or electronic cigarettes. °· Avoid drinking alcohol. °· Only take medicine as directed by your health care provider. °· Follow your health care provider's suggestions for further testing if your chest pain does not go away. °· Keep any follow-up appointments you made. If you do not go to an appointment, you could develop lasting (chronic) problems with pain. If there is any problem keeping an appointment, call to reschedule. °SEEK MEDICAL CARE IF:  °· Your chest pain does not go away, even after treatment. °· You have a rash with blisters on your chest. °· You have a fever. °SEEK IMMEDIATE MEDICAL CARE IF:  °· You have increased chest pain or pain that spreads to your arm, neck, jaw, back, or abdomen. °· You have shortness of breath. °· You have an increasing cough, or you cough   up blood. °· You have severe back or abdominal pain. °· You feel nauseous or vomit. °· You have severe weakness. °· You faint. °· You have chills. °This is an emergency. Do not wait to see if the pain will go away. Get medical help at once. Call your local emergency services (911 in [place] Do not drive  yourself to the hospital. °MAKE SURE YOU:  °· Understand these instructions. °· Will watch your condition. °· Will get help right away if you are not doing well or get worse. °Document Released: 10/29/2004 Document Revised: 01/24/2013 Document Reviewed: 08/25/2007 °ExitCare® Patient Information ©2015 [employer]. This information is not intended to replace advice given to you by your health care provider. Make sure you discuss any questions you have with your health care provider. °Fatigue °Fatigue is a feeling of tiredness, lack of energy, lack of motivation, or feeling tired all the time. Having enough rest, good nutrition, and reducing stress will normally reduce fatigue. Consult your caregiver if it persists. The nature of your fatigue will help your caregiver to find out its cause. The treatment is based on the cause.  °CAUSES  °There are many causes for fatigue. Most of the time, fatigue can be traced to one or more of your habits or routines. Most causes fit into one or more of three general areas. They are: °Lifestyle problems °· Sleep disturbances. °· Overwork. °· Physical exertion. °· Unhealthy habits. °¨ Poor eating habits or eating disorders. °¨ Alcohol and/or drug use . °¨ Lack of proper nutrition (malnutrition). °Psychological problems °· Stress and/or anxiety problems. °· Depression. °· Grief. °· Boredom. °Medical Problems or Conditions °· Anemia. °· Pregnancy. °· Thyroid gland problems. °· Recovery from major surgery. °· Continuous pain. °· Emphysema or asthma that is not well controlled °· Allergic conditions. °· Diabetes. °· Infections (such as mononucleosis). °· Obesity. °· Sleep disorders, such as sleep apnea. °· Heart failure or other heart-related problems. °· Cancer. °· Kidney disease. °· Liver disease. °· Effects of certain medicines such as antihistamines, cough and cold remedies, prescription pain medicines, heart and blood pressure medicines, drugs used for treatment of cancer, and some  antidepressants. °SYMPTOMS  °The symptoms of fatigue include:  °· Lack of energy. °· Lack of drive (motivation). °· Drowsiness. °· Feeling of indifference to the surroundings. °DIAGNOSIS  °The details of how you feel help guide your caregiver in finding out what is causing the fatigue. You will be asked about your present and past health condition. It is important to review all medicines that you take, including prescription and non-prescription items. A thorough exam will be done. You will be questioned about your feelings, habits, and normal lifestyle. Your caregiver may suggest blood tests, urine tests, or other tests to look for common medical causes of fatigue.  °TREATMENT  °Fatigue is treated by correcting the underlying cause. For example, if you have continuous pain or depression, treating these causes will improve how you feel. Similarly, adjusting the dose of certain medicines will help in reducing fatigue.  °HOME CARE INSTRUCTIONS  °· Try to get the required amount of good sleep every night. °· Eat a healthy and nutritious diet, and drink enough water throughout the day. °· Practice ways of relaxing (including yoga or meditation). °· Exercise regularly. °· Make plans to change situations that cause stress. Act on those plans so that stresses decrease over time. Keep your work and personal routine reasonable. °· Avoid street drugs and minimize use of alcohol. °· Start taking   a daily multivitamin after consulting your caregiver. °SEEK MEDICAL CARE IF:  °· You have persistent tiredness, which cannot be accounted for. °· You have fever. °· You have unintentional weight loss. °· You have headaches. °· You have disturbed sleep throughout the night. °· You are feeling sad. °· You have constipation. °· You have dry skin. °· You have gained weight. °· You are taking any new or different medicines that you suspect are causing fatigue. °· You are unable to sleep at night. °· You develop any unusual swelling of your  legs or other parts of your body. °SEEK IMMEDIATE MEDICAL CARE IF:  °· You are feeling confused. °· Your vision is blurred. °· You feel faint or pass out. °· You develop severe headache. °· You develop severe abdominal, pelvic, or back pain. °· You develop chest pain, shortness of breath, or an irregular or fast heartbeat. °· You are unable to pass a normal amount of urine. °· You develop abnormal bleeding such as bleeding from the rectum or you vomit blood. °· You have thoughts about harming yourself or committing suicide. °· You are worried that you might harm someone else. °MAKE SURE YOU:  °· Understand these instructions. °· Will watch your condition. °· Will get help right away if you are not doing well or get worse. °Document Released: 11/16/2006 Document Revised: 04/13/2011 Document Reviewed: 05/23/2013 °ExitCare® Patient Information ©2015 [employer]. This information is not intended to replace advice given to you by your health care provider. Make sure you discuss any questions you have with your health care provider. ° °

## 2014-01-30 NOTE — ED Provider Notes (Signed)
CSN: [id number]     Arrival date & time 01/30/14  1216 History   First MD Initiated Contact with Patient 01/30/14 1330     Chief Complaint  Patient presents with  . Chest Pain  . Nausea     (Consider location/radiation/quality/duration/timing/severity/associated sxs/prior Treatment) HPI Comments: The patient is a 80 year old female with a past history of systolic heart failure (defibulator, hypertension, hypothyroidism, reflux, presenting to emergency room chief complaint of left-sided chest discomfort for 3 weeks. Patient reports left-sided chest discomfort as intermittent, nonradiating, dull. She denies aggravating or relieving symptoms. Patient reports associated weakness denies shortness of breath. Denies lower extremity edema. Denies defibrillation from biventricular device/ICD.  Patient denies fever, chills, cough, no known sick contacts. Patient reports low abdominal discomfort yesterday with associated diarrhea.  PCP: Dr. Kenton Kingfisher Cardiology: Caryl Comes, [place]  Patient is a 80 y.o. female presenting with chest pain. The history is provided by the patient. No language interpreter was used.  Chest Pain   Past Medical History  Diagnosis Date  . Hypertension   . Hypercholesterolemia   . Left bundle branch block   . Hypothyroidism   . Spinal stenosis   . GERD (gastroesophageal reflux disease)   . Vitamin D deficiency   . Neurogenic claudication due to lumbar spinal stenosis   . Chest pain     a. Nl cath in the 90's;  b. 04/2005: Myoview: subtle areao of ? reversibility in apical segment of anteromedial LV - ? attenuation, EF 54%;  c. 05/2012 MV: small, fixed mild apical septal perfusion defect, ? attenuation due to lbbb, no ischemia.  . Non-ischemic cardiomyopathy     LHC 10/04/12: Normal coronary arteries, EF 25-30% with global HK.  Echocardiogram 10/05/12: EF 30-35%, diffuse HK, moderate LAE. Echocardiogram (01/2013): Lateral to septal wall dyssynchrony EF 20-25%, diffuse HK  . Chronic  systolic heart failure   . Automatic implantable cardioverter-defibrillator in situ   . CHF (congestive heart failure)   . Osteoarthritis     "about all over" (02/22/2013)  . LV lead partial dislodgment 05/30/2013  . Biventricular defibrillator  Medtronic 05/30/2013   Past Surgical History  Procedure Laterality Date  . Tubal ligation    . Foot arthrodesis, modified mcbride Bilateral   . Knee arthroscopy Left   . Knee arthroscopy Right   . Cardiac catheterization  2000  . Lumbar laminectomy  11/03/2006     Bilateral 3, 4, 5 laminectomy, partial L2, decompression of the thecal sac, foraminotomy, posterolateral arthrodesis L3-S1 with autograft   -- SURGEON:  Leeroy Cha, M.D.   . Cardiac defibrillator placement  02/22/2013    with LV lead placement and HV lead testing/notes 02/22/2013  . Appendectomy    . Excisional hemorrhoidectomy    . Tonsillectomy    . Blepharoplasty Bilateral   . Vaginal hysterectomy    . Left heart catheterization with coronary angiogram N/A 10/04/2012    Procedure: LEFT HEART CATHETERIZATION WITH CORONARY ANGIOGRAM;  Surgeon: Peter M Martinique, MD;  Location: [facility] C[facility] LAB;  Service: Cardiovascular;  Laterality: N/A;  . Bi-ventricular implantable cardioverter defibrillator N/A 02/22/2013    Procedure: BI-VENTRICULAR IMPLANTABLE CARDIOVERTER DEFIBRILLATOR  (CRT-D);  Surgeon: Deboraha Sprang, MD;  Location: [facility] CATH LAB;  Service: Cardiovascular;  Laterality: N/A;   Family History  Problem Relation Age of Onset  . Heart attack Mother     died @ 69.  . Thyroid disease Mother   . Lung cancer Mother   . Hyperlipidemia Mother   . Aneurysm Father  died @ 30.  . Aneurysm Brother     has had an Aneurysm x3  . Colon cancer Brother   . Bone cancer Brother   . Lung cancer Brother   . Stomach cancer Brother    History  Substance Use Topics  . Smoking status: Never Smoker   . Smokeless tobacco: Never Used  . Alcohol Use: No   OB History    No data available      Review of Systems  Cardiovascular: Positive for chest pain.      Allergies  Niaspan and Statins  Home Medications   Prior to Admission medications   Medication Sig Start Date End Date Taking? Authorizing Provider  Ascorbic Acid (VITAMIN C) 1000 MG tablet Take 1,000 mg by mouth at bedtime.     Historical Provider, MD  Biotin (BIOTIN 5000) 5 MG CAPS Take 5,000 mg by mouth daily.    Historical Provider, MD  carvedilol (COREG) 6.25 MG tablet Take 3.125 mg (1/2 tablet) in the morning and 6.25 mg (1 tablet) in the evening. 07/19/13   Rande Brunt, NP  cholecalciferol (VITAMIN D) 1000 UNITS tablet Take 1,000 Units by mouth daily.      Historical Provider, MD  citalopram (CELEXA) 10 MG tablet Take 2 tablets (20 mg total) by mouth daily. 05/30/13   Deboraha Sprang, MD  furosemide (LASIX) 20 MG tablet Take 1 tablet (20 mg total) by mouth every Monday, Wednesday, and Friday. 09/26/13   Rande Brunt, NP  levothyroxine (SYNTHROID, LEVOTHROID) 50 MCG tablet Take 50 mcg by mouth daily.      Historical Provider, MD  lisinopril (PRINIVIL,ZESTRIL) 20 MG tablet Take 20 mg by mouth daily. 09/26/13   Rande Brunt, NP  Magnesium Oxide (MAG-200 PO) Take by mouth.    Historical Provider, MD  multivitamin [facility]) per tablet Take 1 tablet by mouth daily.      Historical Provider, MD  omeprazole (PRILOSEC) 20 MG capsule Take 40 mg by mouth daily.     Historical Provider, MD  traMADol (ULTRAM) 50 MG tablet Take 1 tablet by mouth every 12 (twelve) hours as needed for moderate pain.  10/06/12   Historical Provider, MD  vitamin E 400 UNIT capsule Take 400 Units by mouth daily.    Historical Provider, MD   BP 149/94 mmHg  Pulse 80  Temp(Src) 98.4 F (36.9 C) (Oral)  Resp 18  Ht 5' 7.5" (1.715 m)  Wt 175 lb (79.379 kg)  BMI 26.99 kg/m2  SpO2 97% Physical Exam  Constitutional: She is oriented to person, place, and time. She appears well-developed and well-nourished. No distress.  HENT:  Head:  Normocephalic and atraumatic.  Eyes: EOM are normal.  Neck: Neck supple.  Cardiovascular: Normal rate and regular rhythm.   No lower extremity edema  Pulmonary/Chest: Effort normal. No respiratory distress. She has no wheezes. She has no rales. She exhibits no tenderness.  Left upper chest wall: ICD site non-tender  Abdominal: Soft. There is no tenderness. There is no rebound and no guarding.  Musculoskeletal: Normal range of motion.  Neurological: She is alert and oriented to person, place, and time.  Skin: Skin is warm and dry. She is not diaphoretic.  Psychiatric: She has a normal mood and affect. Her behavior is normal.  Nursing note and vitals reviewed.   ED Course  Procedures (including critical care time) Labs Review Labs Reviewed  BASIC METABOLIC PANEL - Abnormal; Notable for the following:    GFR calc non  Af Amer 50 (*)    GFR calc Af Amer 58 (*)    All other components within normal limits  I-STAT TROPOININ, ED - Abnormal; Notable for the following:    Troponin i, poc 0.09 (*)    All other components within normal limits  BRAIN NATRIURETIC PEPTIDE  CBC WITH DIFFERENTIAL  PROTIME-INR  APTT  URINALYSIS, ROUTINE W REFLEX MICROSCOPIC  TROPONIN I    Imaging Review No results found.   EKG Interpretation   Date/Time:  Tuesday January 30 2014 12:25:48 EST Ventricular Rate:  86 PR Interval:  128 QRS Duration: 126 QT Interval:  402 QTC Calculation: 481 R Axis:   -77 Text Interpretation:  Electronic ventricular pacemaker since last tracing  no significant change Confirmed by [facility]  MD, ELLIOTT [phone]) on  01/30/2014 1:54:48 PM       EKG Interpretation  Date/Time:  Tuesday January 30 2014 13:27:37 EST Ventricular Rate:  74 PR Interval:  143 QRS Duration: 122 QT Interval:  428 QTC Calculation: 475 R Axis:   -75 Text Interpretation:  Electronic ventricular pacemaker Confirmed by Eulis Foster  MD, Vira Agar ([id number]) on 01/30/2014 1:56:12 PM       MDM   Final  diagnoses:  Chest pain   80 year old female with systolic heart failure, EF 40-45% per echo 5/15. Has Medtronic CRT-D device placed (had baseline LBBB).Cardiac cath 9/14 no significant CAD. Presents with Chest pain ongoing.  Elevated troponin 0.09, EKG unchanged from previous.   Dr. Eulis Foster to evaluate, follow up on second tropon and disposition.  Harvie Heck, PA-C 01/30/14 [place], MD 02/01/14 [phone]

## 2014-01-30 NOTE — Telephone Encounter (Signed)
Patient reports pain under l breast off and on for approx 1 month. Pain radiates to neck and shoulders. She is also very sob, although she says she has lost about 10 lbs in the lst month. Also has nausea, anorexia, and gets clammy with any activity. States "I just don't feel right." BP110/60 rt 110/44 lt hr 72 Spoke with D Dunn PA who recommends she go to ED. She is agreeable to this plan and will have someone drive her.

## 2014-01-30 NOTE — Telephone Encounter (Signed)
New Message        Patient states that she has been sick and just so tired, having pains in her chest that comes and goes for the last couple of days. No Er visit or PCP was contacted. She also states she has felt nauseated throughout the day for about a week. AM meds were taking , 0-10 pain scale she rates pain as a 1.

## 2014-01-30 NOTE — ED Notes (Signed)
Pt is in stable condition upon d/c and denies the use of a wheelchair. PT ambulates independently from ED and is escorted by ED RN.

## 2014-02-06 ENCOUNTER — Ambulatory Visit (INDEPENDENT_AMBULATORY_CARE_PROVIDER_SITE_OTHER): Payer: Commercial Managed Care - HMO | Admitting: *Deleted

## 2014-02-06 DIAGNOSIS — I428 Other cardiomyopathies: Secondary | ICD-10-CM

## 2014-02-06 DIAGNOSIS — I429 Cardiomyopathy, unspecified: Secondary | ICD-10-CM

## 2014-02-06 DIAGNOSIS — I5022 Chronic systolic (congestive) heart failure: Secondary | ICD-10-CM

## 2014-02-06 NOTE — Progress Notes (Signed)
Remote ICD transmission.   

## 2014-02-08 LAB — MDC_IDC_ENUM_SESS_TYPE_REMOTE
Battery Voltage: 2.96 V
Brady Statistic AP VP Percent: 13.38 %
Brady Statistic AS VP Percent: 83.33 %
Brady Statistic AS VS Percent: 2.97 %
HighPow Impedance: 71 Ohm
Lead Channel Impedance Value: 323 Ohm
Lead Channel Impedance Value: 323 Ohm
Lead Channel Impedance Value: 380 Ohm
Lead Channel Impedance Value: 570 Ohm
Lead Channel Impedance Value: 570 Ohm
Lead Channel Impedance Value: 570 Ohm
Lead Channel Impedance Value: 570 Ohm
Lead Channel Impedance Value: 570 Ohm
Lead Channel Impedance Value: 570 Ohm
Lead Channel Pacing Threshold Amplitude: 0.5 V
Lead Channel Pacing Threshold Amplitude: 2.25 V
Lead Channel Sensing Intrinsic Amplitude: 13 mV
Lead Channel Sensing Intrinsic Amplitude: 13 mV
Lead Channel Setting Pacing Amplitude: 2 V
Lead Channel Setting Pacing Amplitude: 4 V
Lead Channel Setting Pacing Pulse Width: 0.7 ms
MDC IDC MSMT BATTERY REMAINING LONGEVITY: 40 mo
MDC IDC MSMT LEADCHNL LV IMPEDANCE VALUE: 342 Ohm
MDC IDC MSMT LEADCHNL LV IMPEDANCE VALUE: 437 Ohm
MDC IDC MSMT LEADCHNL LV PACING THRESHOLD PULSEWIDTH: 0.7 ms
MDC IDC MSMT LEADCHNL RA IMPEDANCE VALUE: 494 Ohm
MDC IDC MSMT LEADCHNL RA PACING THRESHOLD AMPLITUDE: 0.625 V
MDC IDC MSMT LEADCHNL RA PACING THRESHOLD PULSEWIDTH: 0.4 ms
MDC IDC MSMT LEADCHNL RA SENSING INTR AMPL: 2.75 mV
MDC IDC MSMT LEADCHNL RA SENSING INTR AMPL: 2.75 mV
MDC IDC MSMT LEADCHNL RV IMPEDANCE VALUE: 665 Ohm
MDC IDC MSMT LEADCHNL RV PACING THRESHOLD PULSEWIDTH: 0.4 ms
MDC IDC SESS DTM: 20160105072604
MDC IDC SET LEADCHNL RA PACING AMPLITUDE: 1.5 V
MDC IDC SET LEADCHNL RV PACING PULSEWIDTH: 0.4 ms
MDC IDC SET LEADCHNL RV SENSING SENSITIVITY: 0.3 mV
MDC IDC SET ZONE DETECTION INTERVAL: 250 ms
MDC IDC STAT BRADY AP VS PERCENT: 0.32 %
MDC IDC STAT BRADY RA PERCENT PACED: 13.7 %
MDC IDC STAT BRADY RV PERCENT PACED: 94.68 %
Zone Setting Detection Interval: 300 ms
Zone Setting Detection Interval: 350 ms
Zone Setting Detection Interval: 450 ms

## 2014-02-14 ENCOUNTER — Ambulatory Visit (INDEPENDENT_AMBULATORY_CARE_PROVIDER_SITE_OTHER): Payer: Commercial Managed Care - HMO | Admitting: Cardiovascular Disease

## 2014-02-14 ENCOUNTER — Other Ambulatory Visit (HOSPITAL_COMMUNITY): Payer: Self-pay | Admitting: Anesthesiology

## 2014-02-14 ENCOUNTER — Encounter: Payer: Self-pay | Admitting: Cardiovascular Disease

## 2014-02-14 VITALS — BP 124/80 | HR 70 | Ht 67.5 in | Wt 183.8 lb

## 2014-02-14 DIAGNOSIS — I5022 Chronic systolic (congestive) heart failure: Secondary | ICD-10-CM

## 2014-02-14 MED ORDER — CARVEDILOL 6.25 MG PO TABS: 6.2500 mg | ORAL_TABLET | Freq: Two times a day (BID) | ORAL | Status: DC

## 2014-02-14 NOTE — Assessment & Plan Note (Signed)
Heather Smith presents today for followup of her chronic systolic congestive heart failure. Unfortunately, her left ventricular lead was malfunctioning. Despite that, her left ventricular function has improved on medical therapy. Her last ejection fraction is now 40%. We will continue to gradually titrate up her medications. We'll increase her carvedilol to 6.5 mg twice a day. I'll see her in followup in 6 months for office visit. She will continue to see the EP clinic and Dr. Cleda Mccreedy.

## 2014-02-14 NOTE — Progress Notes (Signed)
[facility]. [address], [place]  [address] Phone: [phone] Fax:  [phone]  Date:  02/14/2014   ID:  Heather Smith, DOB Sep 04, 1932, MRN [id number]  PCP:  Shirline Frees, MD  Cardiologist:  Dr. Liam Rogers    Problem List 1. Acute on chronic systlic CHf- normal cors by cath 2. LBBB 3. HTN 4. Hypothyroidism  History of Present Illness: Heather Smith is a 81 y.o. female who returns for follow up after a recent admission to the hospital with CP and SOB.   She has a hx of normal cardiac catheterization in the 1990s, HTN, HL, LBBB, hypothyroidism, GERD.  Myoview 4/14: Low risk, no ischemia, small, fixed mild apical septal perfusion defect; not gated. She was admitted 9/1-9/3 after presenting with chest pain, dyspnea and near syncope. She ruled out for myocardial infarction by enzymes. LHC 10/04/12: Normal coronary arteries, EF 25-30% with global HK.  Echocardiogram 10/05/12: EF 30-35%, diffuse HK, moderate LAE.  She was diagnosed with nonischemic cardiomyopathy. Medications were adjusted.  Since d/c she has been fatigued.  No CP.  No significant dyspnea.  She is NYHA Class II.  No orthopnea, PND, edema.   No syncope.  She had had more pain with DJD since having to stop Meloxicam.    Nov. 13, 2014:  She is feeling poorly - fatigued.  Also is having lots of arthritis problems because she has not been able to take her arthritis meds because of her systolic chf.  Dec. 16, 0814:  Heather Smith  Continues to have dyspnea.  Recent echo shows:  Persistently depressed left ventricular systolic function with an ejection fraction of 20-25%.  She is till limited in her activities.  She has some mild lightheadedness.   April 20, 2013:  She has had a Bi-V ICD placed Jan. 15, 2015.   She does not feel all that much better since the ICD placement.  She feels about the same.   July 26, 2013:  Heather Smith is not feeling well.  She has malfunction of her LV lead. She has had  persistent NYHA class II - III.    Jan. 13, 2016:  Heather Smith is a 81 yo with hx of chronic systolic congestive heart failure. He's had a biventricular pacer. She's had no function of her LV lead. He has persistent class II - III symptoms. She was in the ER several weeks ago.  Has a head cold. She has seen Dr. Caryl Comes.  He recommends not changing out the malfuntioning LV lead.     Wt Readings from Last 3 Encounters:  02/14/14 183 lb 12.8 oz (83.371 kg)  01/30/14 175 lb (79.379 kg)  07/26/13 183 lb 6.4 oz (83.19 kg)     Past Medical History  Diagnosis Date  . Hypertension   . Hypercholesterolemia   . Left bundle branch block   . Hypothyroidism   . Spinal stenosis   . GERD (gastroesophageal reflux disease)   . Vitamin D deficiency   . Neurogenic claudication due to lumbar spinal stenosis   . Chest pain     a. Nl cath in the 90's;  b. 04/2005: Myoview: subtle areao of ? reversibility in apical segment of anteromedial LV - ? attenuation, EF 54%;  c. 05/2012 MV: small, fixed mild apical septal perfusion defect, ? attenuation due to lbbb, no ischemia.  . Non-ischemic cardiomyopathy     LHC 10/04/12: Normal coronary arteries, EF 25-30% with global HK.  Echocardiogram 10/05/12:  EF 30-35%, diffuse HK, moderate LAE. Echocardiogram (01/2013): Lateral to septal wall dyssynchrony EF 20-25%, diffuse HK  . Chronic systolic heart failure   . Automatic implantable cardioverter-defibrillator in situ   . CHF (congestive heart failure)   . Osteoarthritis     "about all over" (02/22/2013)  . LV lead partial dislodgment 05/30/2013  . Biventricular defibrillator  Medtronic 05/30/2013    Current Outpatient Prescriptions  Medication Sig Dispense Refill  . Ascorbic Acid (VITAMIN C) 1000 MG tablet Take 1,000 mg by mouth daily.     . Biotin (BIOTIN 5000) 5 MG CAPS Take 5 mg by mouth daily.     . carvedilol (COREG) 6.25 MG tablet Take 3.125 mg (1/2 tablet) in the morning and 6.25 mg (1 tablet) in the evening. (Patient  taking differently: Take 3.125-6.25 mg by mouth 2 (two) times daily with a meal. Take 3.125 mg (1/2 tablet) in the morning and 6.25 mg (1 tablet) in the evening.) 45 tablet 6  . cholecalciferol (VITAMIN D) 1000 UNITS tablet Take 1,000 Units by mouth daily.      . citalopram (CELEXA) 10 MG tablet Take 2 tablets (20 mg total) by mouth daily. (Patient taking differently: Take 20 mg by mouth daily. PATIENT UNSURE OF DOSAGE) 60 tablet 1  . furosemide (LASIX) 20 MG tablet Take 1 tablet (20 mg total) by mouth every Monday, Wednesday, and Friday. 12 tablet 3  . levothyroxine (SYNTHROID, LEVOTHROID) 50 MCG tablet Take 50 mcg by mouth daily.      Marland Kitchen lisinopril (PRINIVIL,ZESTRIL) 20 MG tablet Take 20 mg by mouth daily.    . Magnesium Oxide (MAG-200 PO) Take 1 tablet by mouth 2 (two) times daily.     . multivitamin (THERAGRAN) per tablet Take 1 tablet by mouth daily.      . pantoprazole (PROTONIX) 40 MG tablet Take 40 mg by mouth daily.    . traMADol (ULTRAM) 50 MG tablet Take 1 tablet by mouth every 12 (twelve) hours as needed for moderate pain.     . vitamin E 400 UNIT capsule Take 400 Units by mouth daily.     No current facility-administered medications for this visit.    Allergies:    Allergies  Allergen Reactions  . Niaspan [Niacin Er]     High doses, causes hot flashes & aching   . Statins Other (See Comments)    "Makes her bones hurt and very sore"    Social History:  The patient  reports that she has never smoked. She has never used smokeless tobacco. She reports that she does not drink alcohol or use illicit drugs.   ROS:  Please see the history of present illness.   All other systems reviewed and negative.   PHYSICAL EXAM: VS:  BP 124/80 mmHg  Pulse 70  Ht 5' 7.5" (1.715 m)  Wt 183 lb 12.8 oz (83.371 kg)  BMI 28.35 kg/m2  SpO2 97% Well nourished, well developed, in no acute distress HEENT: normal Neck: no JVD Cardiac:  normal S1, S2; RRR; no murmur, ICD site looks good.   Lungs:   clear to auscultation bilaterally, no wheezing, rhonchi or rales Abd: soft, nontender, no hepatomegaly Ext: no edema; right wrist without hematoma or mass  Skin: warm and dry Neuro:  CNs 2-12 intact, no focal abnormalities noted  EKG:      ASSESSMENT AND PLAN:

## 2014-02-14 NOTE — Patient Instructions (Signed)
Your physician has recommended you make the following change in your medication:  INCREASE Carvedilol (Coreg) to 6.25 mg twice daily  Your physician wants you to follow-up in: 6 months with Dr. Acie Fredrickson.  You will receive a reminder letter in the mail two months in advance. If you don't receive a letter, please call our office to schedule the follow-up appointment.

## 2014-02-28 ENCOUNTER — Other Ambulatory Visit: Payer: Self-pay | Admitting: Gastroenterology

## 2014-04-12 ENCOUNTER — Encounter: Payer: Self-pay | Admitting: *Deleted

## 2014-04-19 ENCOUNTER — Encounter: Payer: Self-pay | Admitting: Internal Medicine

## 2014-06-04 ENCOUNTER — Encounter: Payer: Self-pay | Admitting: Internal Medicine

## 2014-06-04 ENCOUNTER — Ambulatory Visit
Admission: RE | Admit: 2014-06-04 | Discharge: 2014-06-04 | Disposition: A | Discharge status: Home / Self Care | Payer: Commercial Managed Care - HMO | Source: Ambulatory Visit | Attending: Internal Medicine | Admitting: Internal Medicine

## 2014-06-04 ENCOUNTER — Ambulatory Visit (INDEPENDENT_AMBULATORY_CARE_PROVIDER_SITE_OTHER): Payer: Commercial Managed Care - HMO | Admitting: Internal Medicine

## 2014-06-04 VITALS — BP 122/64 | HR 62 | Ht 67.0 in | Wt 186.2 lb

## 2014-06-04 DIAGNOSIS — I429 Cardiomyopathy, unspecified: Secondary | ICD-10-CM

## 2014-06-04 DIAGNOSIS — R001 Bradycardia, unspecified: Secondary | ICD-10-CM

## 2014-06-04 DIAGNOSIS — I5022 Chronic systolic (congestive) heart failure: Secondary | ICD-10-CM | POA: Diagnosis not present

## 2014-06-04 DIAGNOSIS — T82110A Breakdown (mechanical) of cardiac electrode, initial encounter: Secondary | ICD-10-CM

## 2014-06-04 DIAGNOSIS — Z4502 Encounter for adjustment and management of automatic implantable cardiac defibrillator: Secondary | ICD-10-CM

## 2014-06-04 DIAGNOSIS — Z95810 Presence of automatic (implantable) cardiac defibrillator: Secondary | ICD-10-CM | POA: Diagnosis not present

## 2014-06-04 DIAGNOSIS — I428 Other cardiomyopathies: Secondary | ICD-10-CM

## 2014-06-04 NOTE — Patient Instructions (Signed)
Medication Instructions:  Your physician recommends that you continue on your current medications as directed. Please refer to the Current Medication list given to you today.  Labwork: None ordered  Testing/Procedures: A chest x-ray takes a picture of the organs and structures inside the chest, including the heart, lungs, and blood vessels. This test can show several things, including, whether the heart is enlarges; whether fluid is building up in the lungs; and whether pacemaker / defibrillator leads are still in place.   Follow-Up: Your physician recommends that you schedule a follow-up appointment in: heart failure clinic next available.   Remote monitoring is used to monitor your Pacemaker of ICD from home. This monitoring reduces the number of office visits required to check your device to one time per year. It allows [place] to keep an eye on the functioning of your device to ensure it is working properly. You are scheduled for a device check from home on 09/03/14.12. You may send your transmission at any time that day. If you have a wireless device, the transmission will be sent automatically. After your physician reviews your transmission, you will receive a postcard with your next transmission date.  Your physician wants you to follow-up in: 1 year with Dr. Caryl Comes.  You will receive a reminder letter in the mail two months in advance. If you don't receive a letter, please call our office to schedule the follow-up appointment.   Thank you for choosing [facility]!!

## 2014-06-04 NOTE — Progress Notes (Signed)
/      Patient Care Team: Shirline Frees, MD as PCP - General   HPI  Heather Smith is a 81 y.o. female Seen in followup for CRT implantation for nonischemic cardiomyopathy and depressed LV function. This was accomplished fall 2014  Interval partial lead dislodgment noted after she saw Dr. Mariana Single in March and an x-ray was obtained and reviewed. This  x-ray was reviewed. We reprogrammed the device to throat pacer lead 2 as there's been a further interval change in pacing threshold.  There is been no interval improvement in her symptoms of fatigue shortness of breath. She remains depressed. We started her on citalopram last time without any improvement. Interval up titration has been associated with some benefit    There is no edema or nocturnal dyspnea.    Repeat CXR demonstrated no interval further dislodgment and reasonable position in the basilar segment of the posterolateral wall  Interval echo has shown EF>>40%  Past Medical History  Diagnosis Date  . Hypertension   . Hypercholesterolemia   . Left bundle branch block   . Hypothyroidism   . Spinal stenosis   . GERD (gastroesophageal reflux disease)   . Vitamin D deficiency   . Neurogenic claudication due to lumbar spinal stenosis   . Chest pain     a. Nl cath in the 90's;  b. 04/2005: Myoview: subtle areao of ? reversibility in apical segment of anteromedial LV - ? attenuation, EF 54%;  c. 05/2012 MV: small, fixed mild apical septal perfusion defect, ? attenuation due to lbbb, no ischemia.  . Non-ischemic cardiomyopathy     LHC 10/04/12: Normal coronary arteries, EF 25-30% with global HK.  Echocardiogram 10/05/12: EF 30-35%, diffuse HK, moderate LAE. Echocardiogram (01/2013): Lateral to septal wall dyssynchrony EF 20-25%, diffuse HK  . Chronic systolic heart failure   . Automatic implantable cardioverter-defibrillator in situ   . CHF (congestive heart failure)   . Osteoarthritis     "about all over" (02/22/2013)  . LV lead  partial dislodgment 05/30/2013  . Biventricular defibrillator  Medtronic 05/30/2013    Past Surgical History  Procedure Laterality Date  . Tubal ligation    . Foot arthrodesis, modified mcbride Bilateral   . Knee arthroscopy Left   . Knee arthroscopy Right   . Cardiac catheterization  2000  . Lumbar laminectomy  11/03/2006     Bilateral 3, 4, 5 laminectomy, partial L2, decompression of the thecal sac, foraminotomy, posterolateral arthrodesis L3-S1 with autograft   -- SURGEON:  Leeroy Cha, M.D.   . Cardiac defibrillator placement  02/22/2013    with LV lead placement and HV lead testing/notes 02/22/2013  . Appendectomy    . Excisional hemorrhoidectomy    . Tonsillectomy    . Blepharoplasty Bilateral   . Vaginal hysterectomy    . Left heart catheterization with coronary angiogram N/A 10/04/2012    Procedure: LEFT HEART CATHETERIZATION WITH CORONARY ANGIOGRAM;  Surgeon: Peter M Martinique, MD;  Location: [facility] CATH LAB;  Service: Cardiovascular;  Laterality: N/A;  . Bi-ventricular implantable cardioverter defibrillator N/A 02/22/2013    Procedure: BI-VENTRICULAR IMPLANTABLE CARDIOVERTER DEFIBRILLATOR  (CRT-D);  Surgeon: Deboraha Sprang, MD;  Location: [facility] CATH LAB;  Service: Cardiovascular;  Laterality: N/A;    Current Outpatient Prescriptions  Medication Sig Dispense Refill  . Ascorbic Acid (VITAMIN C) 1000 MG tablet Take 1,000 mg by mouth daily.     . B Complex Vitamins (VITAMIN B COMPLEX PO) Take 1 tablet by mouth daily.    Marland Kitchen  Biotin (BIOTIN 5000) 5 MG CAPS Take 5 mg by mouth daily.     . carvedilol (COREG) 6.25 MG tablet Take 1 tablet (6.25 mg total) by mouth 2 (two) times daily with a meal. 180 tablet 3  . cholecalciferol (VITAMIN D) 1000 UNITS tablet Take 1,000 Units by mouth daily.      . citalopram (CELEXA) 20 MG tablet Take 1 tablet by mouth daily.    . furosemide (LASIX) 20 MG tablet TAKE ONE TABLET BY MOUTH ON MONDAY, WEDNESDAY AN FRIDAY. 12 tablet 6  . levothyroxine (SYNTHROID,  LEVOTHROID) 50 MCG tablet Take 50 mcg by mouth daily.      Marland Kitchen lisinopril (PRINIVIL,ZESTRIL) 20 MG tablet Take 20 mg by mouth daily.    . Magnesium Oxide (MAG-200 PO) Take 1 tablet by mouth 2 (two) times daily.     . multivitamin (THERAGRAN) per tablet Take 1 tablet by mouth daily.      . pantoprazole (PROTONIX) 40 MG tablet Take 40 mg by mouth daily.    . traMADol (ULTRAM) 50 MG tablet Take 1 tablet by mouth every 12 (twelve) hours as needed for moderate pain.     . vitamin E 400 UNIT capsule Take 400 Units by mouth daily.     No current facility-administered medications for this visit.    Allergies  Allergen Reactions  . Niaspan [Niacin Er]     High doses, causes hot flashes & aching   . Statins Other (See Comments)    "Makes her bones hurt and very sore"    Review of Systems negative except from HPI and PMH  Physical Exam BP 122/64 mmHg  Pulse 62  Ht 5\' 7"  (1.702 m)  Wt 186 lb 3.2 oz (84.46 kg)  BMI 29.16 kg/m2 Well developed and well nourished in no acute distress HENT normal E scleral and icterus clear Neck Supple JVP flat; carotids brisk and full Clear to ausculation  *Regular rate and rhythm, no murmurs gallops or rub Soft with active bowel sounds No clubbing cyanosis Edema Alert and oriented, grossly normal motor and sensory function Skin Warm and Dry  ECG demonstrates  AV pacing with occasional PVCs  Assessment and  Plan  Nonischemic cardiomyopathy interval improvemetn  Congestive heart failure-chronic-systolic Euvolemic continue current meds  Medtronic ICD-CRT The patient's device was interrogated.  The information was reviewed. No changes were made in the programming.     Atrial fibrillation-paroxysmal  LV threshold change  Depression   Will get CXR for lead location  Consider entresto and  referral  from cardiopulmonary stress testing. We'll send her  back to the heart failure clinic for this.  We spent more than 50% of our >40 min visit in face to  face counseling regarding the above

## 2014-06-05 LAB — CUP PACEART INCLINIC DEVICE CHECK
Battery Remaining Longevity: 51 mo
Battery Voltage: 2.94 V
Brady Statistic AP VP Percent: 20.23 %
Brady Statistic AP VS Percent: 0.44 %
Brady Statistic AS VP Percent: 76.79 %
Brady Statistic AS VS Percent: 2.54 %
Brady Statistic RA Percent Paced: 20.67 %
Brady Statistic RV Percent Paced: 94.82 %
Date Time Interrogation Session: 20160502182815
HighPow Impedance: 209 Ohm
HighPow Impedance: 68 Ohm
Lead Channel Impedance Value: 456 Ohm
Lead Channel Impedance Value: 627 Ohm
Lead Channel Pacing Threshold Amplitude: 0.5 V
Lead Channel Pacing Threshold Amplitude: 0.75 V
Lead Channel Pacing Threshold Amplitude: 2.5 V
Lead Channel Pacing Threshold Pulse Width: 0.4 ms
Lead Channel Pacing Threshold Pulse Width: 0.4 ms
Lead Channel Pacing Threshold Pulse Width: 0.7 ms
Lead Channel Sensing Intrinsic Amplitude: 14 mV
Lead Channel Sensing Intrinsic Amplitude: 15.875 mV
Lead Channel Sensing Intrinsic Amplitude: 2.875 mV
Lead Channel Sensing Intrinsic Amplitude: 3.625 mV
Lead Channel Setting Pacing Amplitude: 1.5 V
Lead Channel Setting Pacing Amplitude: 2 V
Lead Channel Setting Pacing Amplitude: 2.5 V
Lead Channel Setting Pacing Pulse Width: 0.4 ms
Lead Channel Setting Pacing Pulse Width: 0.7 ms
Lead Channel Setting Sensing Sensitivity: 0.3 mV
Zone Setting Detection Interval: 250 ms
Zone Setting Detection Interval: 300 ms
Zone Setting Detection Interval: 350 ms
Zone Setting Detection Interval: 450 ms

## 2014-06-18 ENCOUNTER — Telehealth: Payer: Self-pay | Admitting: Internal Medicine

## 2014-06-18 NOTE — Telephone Encounter (Signed)
Follow Up      Pt returning Sherri's phone call from Friday.

## 2014-06-18 NOTE — Telephone Encounter (Signed)
Informed the pt that per Dr Caryl Comes according to the pts CXR, her lead position is stable.  Pt verbalized understanding.

## 2014-06-20 ENCOUNTER — Ambulatory Visit (HOSPITAL_COMMUNITY)
Admission: RE | Admit: 2014-06-20 | Discharge: 2014-06-20 | Disposition: A | Discharge status: Home / Self Care | Payer: Commercial Managed Care - HMO | Source: Ambulatory Visit | Attending: Cardiology | Admitting: Cardiology

## 2014-06-20 VITALS — BP 120/66 | HR 71 | Wt 185.4 lb

## 2014-06-20 DIAGNOSIS — M170 Bilateral primary osteoarthritis of knee: Secondary | ICD-10-CM | POA: Insufficient documentation

## 2014-06-20 DIAGNOSIS — Z8249 Family history of ischemic heart disease and other diseases of the circulatory system: Secondary | ICD-10-CM | POA: Insufficient documentation

## 2014-06-20 DIAGNOSIS — R0600 Dyspnea, unspecified: Secondary | ICD-10-CM | POA: Diagnosis not present

## 2014-06-20 DIAGNOSIS — E785 Hyperlipidemia, unspecified: Secondary | ICD-10-CM | POA: Insufficient documentation

## 2014-06-20 DIAGNOSIS — R0683 Snoring: Secondary | ICD-10-CM

## 2014-06-20 DIAGNOSIS — Z95810 Presence of automatic (implantable) cardiac defibrillator: Secondary | ICD-10-CM | POA: Diagnosis not present

## 2014-06-20 DIAGNOSIS — I10 Essential (primary) hypertension: Secondary | ICD-10-CM

## 2014-06-20 DIAGNOSIS — I5022 Chronic systolic (congestive) heart failure: Secondary | ICD-10-CM

## 2014-06-20 DIAGNOSIS — F329 Major depressive disorder, single episode, unspecified: Secondary | ICD-10-CM | POA: Insufficient documentation

## 2014-06-20 DIAGNOSIS — Z79899 Other long term (current) drug therapy: Secondary | ICD-10-CM | POA: Insufficient documentation

## 2014-06-20 DIAGNOSIS — I429 Cardiomyopathy, unspecified: Secondary | ICD-10-CM | POA: Insufficient documentation

## 2014-06-20 DIAGNOSIS — R5383 Other fatigue: Secondary | ICD-10-CM | POA: Diagnosis not present

## 2014-06-20 LAB — BASIC METABOLIC PANEL
Anion gap: 8 (ref 5–15)
BUN: 17 mg/dL (ref 6–20)
CHLORIDE: 102 mmol/L (ref 101–111)
CO2: 28 mmol/L (ref 22–32)
CREATININE: 1 mg/dL (ref 0.44–1.00)
Calcium: 9.1 mg/dL (ref 8.9–10.3)
GFR calc Af Amer: >60 mL/min (ref >60)
GFR, EST NON AFRICAN AMERICAN: 52 mL/min — AB (ref >60)
Glucose, Bld: 104 mg/dL — ABNORMAL HIGH (ref 65–99)
Potassium: 4 mmol/L (ref 3.5–5.1)
Sodium: 138 mmol/L (ref 135–145)

## 2014-06-20 LAB — BRAIN NATRIURETIC PEPTIDE: B Natriuretic Peptide: 34.6 pg/mL (ref 0.0–100.0)

## 2014-06-20 MED ORDER — SACUBITRIL-VALSARTAN 24-26 MG PO TABS: 1.0000 | ORAL_TABLET | Freq: Two times a day (BID) | ORAL | Status: DC

## 2014-06-20 NOTE — Progress Notes (Signed)
Patient ID: Heather Smith, female   DOB: 12/24/1932, 81 y.o.   MRN: [id number] PCP: Dr. Kenton Kingfisher Cardiology: Caryl Comes, [place]  81 yo with history of nonischemic cardiomyopathy and chronic systolic HF.  Patient has felt like her energy level has been low for several years.  In 9/14, she was admitted to [facility] with chest pain.  Cardiac cath was done, showing no significant coronary disease.  However, echo showed EF 30-35%.  She was started on meds for CHF, and echo was repeated in 12/14, EF was read as 20-25%.  She had a Medtonic CRT-D device placed (had baseline LBBB).  In 3/15, the LV lead was not capturing properly so device had to be reprogrammed.  She had AV optimization in 5/15.  Limited echo at that time showed improvement in EF to 40-45%.  CPX in 5/15 was submaximal but suggested low normal to mildly reduced functional capacity.    Symptoms are fairly stable since last appointment.  She is short of breath walking up a hill or doing yardwork (for instance, was short of breath yesterday pulling a garden hose).  No orthopnea or PND.  No bendopnea.  No chest pain.  No palpitations.  She is generally fatigued.  Never smoked.  Weight down 4 lbs.  She has not seen much difference in terms of mood with use of Celexa.   Optivol: Fluid index < threshold, stable thoracic impedance, 2 hrs/day activity.   ECG (5/16): AV sequential pacing  Labs (9/14): TSH normal Labs (3/15): K 4.7, creatinine 1.1 Labs (5/15): K 4.9, creatinine 1.1, BNP 44 Labs (6/15): K 5.1, creatinine 1.4, pro-BNP 39 Labs (6/15): K 5.3, creatinine 1.53 Labs (12/15): K 4.4, creatinine 1.03  PMH: 1. Chronic systolic CHF: Nonischemic cardiomyopathy.  LHC (9/14) with no significant coronary disease, EF 25-30% with global hypokinesis.  Echo (9/14) with EF 30-35%, moderate LV dilation, diffuse hypokinesis.  Echo (12/14) with EF 20-25%.  She does not drink ETOH.  She had Medtronic CRT-D device implantation in 1/15. Echo (5/15) with EF 40-45%,  mild LVH.  CPX (5/15) was submaximal with RER 0.99, peak VO2 14, VE/VCO2 37.3; low normal functional capacity may be limited by loss of BiV pacing at higher HR and by deconditioning but interpretation somewhat limited since study was submaximal.  2. Back pain s/p lumbar laminectomy 3. OA: Knees.  4. Hyperlipidemia 5. Depression 5. Depression  SH: Lives [place], married, never smoked, no ETOH.    FH: Mother with MI at 21, possible ischemic cardiomyopathy.  Father with cardiomyopathy, died at 65.  She has 5 brothers, none with significant coronary disease.    ROS: All systems reviewed and negative except as per HPI.   Current Outpatient Prescriptions  Medication Sig Dispense Refill  . Ascorbic Acid (VITAMIN C) 1000 MG tablet Take 1,000 mg by mouth daily.     . B Complex Vitamins (VITAMIN B COMPLEX PO) Take 1 tablet by mouth daily.    . Biotin (BIOTIN 5000) 5 MG CAPS Take 5 mg by mouth daily.     . carvedilol (COREG) 6.25 MG tablet Take 1 tablet (6.25 mg total) by mouth 2 (two) times daily with a meal. 180 tablet 3  . cholecalciferol (VITAMIN D) 1000 UNITS tablet Take 1,000 Units by mouth daily.      . citalopram (CELEXA) 20 MG tablet Take 1 tablet by mouth daily.    . furosemide (LASIX) 20 MG tablet TAKE ONE TABLET BY MOUTH ON MONDAY, WEDNESDAY AN FRIDAY. 12 tablet  6  . levothyroxine (SYNTHROID, LEVOTHROID) 50 MCG tablet Take 50 mcg by mouth daily.      . Magnesium Oxide (MAG-200 PO) Take 1 tablet by mouth 2 (two) times daily.     . multivitamin (THERAGRAN) per tablet Take 1 tablet by mouth daily.      . pantoprazole (PROTONIX) 40 MG tablet Take 40 mg by mouth daily.    . traMADol (ULTRAM) 50 MG tablet Take 1 tablet by mouth every 12 (twelve) hours as needed for moderate pain.     . vitamin E 400 UNIT capsule Take 400 Units by mouth daily.    . sacubitril-valsartan (ENTRESTO) 24-26 MG Take 1 tablet by mouth 2 (two) times daily. 60 tablet 6   No current facility-administered  medications for this encounter.   Filed Vitals:   06/20/14 1509  BP: 120/66  Pulse: 71  Weight: 185 lb 6.4 oz (84.097 kg)  SpO2: 95%    General: NAD, husband present Neck: JVP 7, no thyromegaly or thyroid nodule.  Lungs: Clear to auscultation bilaterally with normal respiratory effort. CV: Nondisplaced PMI.  Heart regular S1/S2, no S3/S4, 1/6 HSM at apex.  No peripheral edema.  No carotid bruit.  Normal pedal pulses.  Abdomen: Soft, nontender, no hepatosplenomegaly, no distention.  Skin: Intact without lesions or rashes.  Neurologic: Alert and oriented x 3.  Psych: Normal affect. Extremities: No clubbing or cyanosis.   Assessment/Plan:  1. Chronic systolic CHF: Nonischemic cardiomyopathy s/p Medtronic CRT-D. EF 40-45% (06/2013). She does have a family history of cardiomyopathy of uncertain etiology (father) but none of her siblings developed a cardiomyopathy.  Familial cardiomyopathy is a consideration.  Viral myocarditis is also a possible etiology.  She is not volume overloaded on exam or by Optivol but still has NYHA class II-III symptoms.  Repeat CPX would be difficult given knee pain.  - Continue current Lasix regimen.  - Continue Coreg 6.25 mg bid. - Stop lisinopril, start Entresto 24/26 bid.  - Not sure why she is off spironolactone.  Will plan to restart at next appointment.   - BMET/BNP today and repeat BMET in 10 days.  - Repeat echo.  - ?other cause of dyspnea/fatigue => I will arrange for sleep study. Will consider PFTs if no improvement (she has never smoked).  2. Depression: On Celexa, does not think that this has helped much.   Loralie Champagne 06/20/2014

## 2014-06-20 NOTE — Patient Instructions (Signed)
Stop Lisinopril  Start Entresto 24/26 mg Twice daily, STARTING ON Friday AM  Labs today  Labs in 10 days  Your physician has recommended that you have a sleep study. This test records several body functions during sleep, including: brain activity, eye movement, oxygen and carbon dioxide blood levels, heart rate and rhythm, breathing rate and rhythm, the flow of air through your mouth and nose, snoring, body muscle movements, and chest and belly movement.  Your physician has requested that you have an echocardiogram. Echocardiography is a painless test that uses sound waves to create images of your heart. It provides your doctor with information about the size and shape of your heart and how well your heart's chambers and valves are working. This procedure takes approximately one hour. There are no restrictions for this procedure.  Your physician recommends that you schedule a follow-up appointment in: 1 month

## 2014-06-21 ENCOUNTER — Telehealth: Payer: Self-pay

## 2014-06-21 NOTE — Telephone Encounter (Signed)
Prior auth for Avalon 24-26 sent to [facility] via Cover My Meds.

## 2014-06-25 ENCOUNTER — Telehealth: Payer: Self-pay

## 2014-06-25 NOTE — Telephone Encounter (Signed)
Fax received from [facility]  With approval for Entresto 24-26 mg tabs good thru 05/192018.

## 2014-07-10 ENCOUNTER — Telehealth (HOSPITAL_COMMUNITY): Payer: Self-pay | Admitting: *Deleted

## 2014-07-10 NOTE — Telephone Encounter (Signed)
Received approval from [facility], pt's Heather Smith has been approved1/1/16-12/31/16

## 2014-07-11 NOTE — Telephone Encounter (Signed)
Per [facility], pt's Entresto 24/26 mg has been approved, if dose is changed in the future it will require PA for higher dose, as of now pt has a $185 deductible to meet, once this is met pt's copay will be $47 a month

## 2014-07-20 ENCOUNTER — Ambulatory Visit (HOSPITAL_BASED_OUTPATIENT_CLINIC_OR_DEPARTMENT_OTHER)
Admission: RE | Admit: 2014-07-20 | Discharge: 2014-07-20 | Disposition: A | Discharge status: Home / Self Care | Payer: Medicare HMO | Source: Ambulatory Visit | Attending: Cardiology | Admitting: Cardiology

## 2014-07-20 ENCOUNTER — Ambulatory Visit (HOSPITAL_COMMUNITY)
Admission: RE | Admit: 2014-07-20 | Discharge: 2014-07-20 | Disposition: A | Discharge status: Home / Self Care | Payer: Medicare HMO | Source: Ambulatory Visit | Attending: Cardiology | Admitting: Cardiology

## 2014-07-20 VITALS — BP 124/60 | HR 59 | Wt 185.5 lb

## 2014-07-20 DIAGNOSIS — I340 Nonrheumatic mitral (valve) insufficiency: Secondary | ICD-10-CM | POA: Diagnosis not present

## 2014-07-20 DIAGNOSIS — F329 Major depressive disorder, single episode, unspecified: Secondary | ICD-10-CM

## 2014-07-20 DIAGNOSIS — I517 Cardiomegaly: Secondary | ICD-10-CM | POA: Insufficient documentation

## 2014-07-20 DIAGNOSIS — I5022 Chronic systolic (congestive) heart failure: Secondary | ICD-10-CM

## 2014-07-20 DIAGNOSIS — I509 Heart failure, unspecified: Secondary | ICD-10-CM | POA: Diagnosis present

## 2014-07-20 DIAGNOSIS — F32A Depression, unspecified: Secondary | ICD-10-CM

## 2014-07-20 LAB — BASIC METABOLIC PANEL
Anion gap: 7 (ref 5–15)
BUN: 21 mg/dL — ABNORMAL HIGH (ref 6–20)
CO2: 28 mmol/L (ref 22–32)
CREATININE: 0.97 mg/dL (ref 0.44–1.00)
Calcium: 9.7 mg/dL (ref 8.9–10.3)
Chloride: 106 mmol/L (ref 101–111)
GFR calc Af Amer: >60 mL/min (ref >60)
GFR calc non Af Amer: 54 mL/min — ABNORMAL LOW (ref >60)
GLUCOSE: 99 mg/dL (ref 65–99)
Potassium: 4.5 mmol/L (ref 3.5–5.1)
Sodium: 141 mmol/L (ref 135–145)

## 2014-07-20 MED ORDER — SPIRONOLACTONE 25 MG PO TABS: 12.5000 mg | ORAL_TABLET | Freq: Every day | ORAL | Status: DC

## 2014-07-20 NOTE — Patient Instructions (Signed)
START Spironolactone 12.5 mg (1/2 tablet daily)  You have been referred to [facility] they will call you to schedule an appt.  LABS today (bmet)  BMET in 10 DAYS.  FOLLOW UP in 2 MONTHS.

## 2014-07-20 NOTE — Progress Notes (Signed)
Echocardiogram 2D Echocardiogram has been performed.  Heather Smith 07/20/2014, 9:57 AM

## 2014-07-22 NOTE — Progress Notes (Signed)
Patient ID: Heather Smith, female   DOB: 04-Jan-1933, 80 y.o.   MRN: [id number] PCP: Dr. Kenton Kingfisher Cardiology: Caryl Comes, [place]  81 yo with history of nonischemic cardiomyopathy and chronic systolic HF.  Patient has felt like her energy level has been low for several years.  In 9/14, she was admitted to [facility] with chest pain.  Cardiac cath was done, showing no significant coronary disease.  However, echo showed EF 30-35%.  She was started on meds for CHF, and echo was repeated in 12/14, EF was read as 20-25%.  She had a Medtonic CRT-D device placed (had baseline LBBB).  In 3/15, the LV lead was not capturing properly so device had to be reprogrammed.  She had AV optimization in 5/15.  Limited echo at that time showed improvement in EF to 40-45%.  CPX in 5/15 was submaximal but suggested low normal to mildly reduced functional capacity. Last echo in 6/16 showed EF 35-40% with diffuse hypokinesis.    Symptoms are fairly stable since last appointment.  She is short of breath walking up a hill or doing heavy yardwork.  No orthopnea or PND.  No bendopnea.  No chest pain.  No palpitations.  She is generally fatigued.  Never smoked.  Weight stable. Depressed mood.  Her husband thinks she has been doing a bit better recently, more active.    Optivol: Fluid index < threshold, stable thoracic impedance  Labs (9/14): TSH normal Labs (3/15): K 4.7, creatinine 1.1 Labs (5/15): K 4.9, creatinine 1.1, BNP 44 Labs (6/15): K 5.1, creatinine 1.4, pro-BNP 39 Labs (6/15): K 5.3, creatinine 1.53 Labs (12/15): K 4.4, creatinine 1.03 Labs (5/16): K 4, creatinine 1.0, BNP 34.6  PMH: 1. Chronic systolic CHF: Nonischemic cardiomyopathy.  LHC (9/14) with no significant coronary disease, EF 25-30% with global hypokinesis.  Echo (9/14) with EF 30-35%, moderate LV dilation, diffuse hypokinesis.  Echo (12/14) with EF 20-25%.  She does not drink ETOH.  She had Medtronic CRT-D device implantation in 1/15. Echo (5/15) with EF  40-45%, mild LVH.  CPX (5/15) was submaximal with RER 0.99, peak VO2 14, VE/VCO2 37.3; low normal functional capacity may be limited by loss of BiV pacing at higher HR and by deconditioning but interpretation somewhat limited since study was submaximal.  Echo (6/16) with EF 35-40%, severe LV dilation, diffuse hypokinesis, mild MR.  2. Back pain s/p lumbar laminectomy 3. OA: Knees.  4. Hyperlipidemia 5. Depression 5. Depression  SH: Lives [place], married, never smoked, no ETOH.    FH: Mother with MI at 68, possible ischemic cardiomyopathy.  Father with cardiomyopathy, died at 24.  She has 5 brothers, none with significant coronary disease.    ROS: All systems reviewed and negative except as per HPI.   Current Outpatient Prescriptions  Medication Sig Dispense Refill  . Ascorbic Acid (VITAMIN C) 1000 MG tablet Take 1,000 mg by mouth daily.     . B Complex Vitamins (VITAMIN B COMPLEX PO) Take 1 tablet by mouth daily.    . Biotin (BIOTIN 5000) 5 MG CAPS Take 5 mg by mouth daily.     . carvedilol (COREG) 6.25 MG tablet Take 1 tablet (6.25 mg total) by mouth 2 (two) times daily with a meal. 180 tablet 3  . cholecalciferol (VITAMIN D) 1000 UNITS tablet Take 1,000 Units by mouth daily.      . citalopram (CELEXA) 20 MG tablet Take 1 tablet by mouth daily.    . Coenzyme Q10 (COQ10) 100 MG CAPS  Take 1 capsule by mouth daily.    . furosemide (LASIX) 20 MG tablet TAKE ONE TABLET BY MOUTH ON MONDAY, WEDNESDAY AN FRIDAY. 12 tablet 6  . levothyroxine (SYNTHROID, LEVOTHROID) 50 MCG tablet Take 50 mcg by mouth daily.      . Magnesium Oxide (MAG-200 PO) Take 1 tablet by mouth 2 (two) times daily.     . multivitamin (THERAGRAN) per tablet Take 1 tablet by mouth daily.      . pantoprazole (PROTONIX) 40 MG tablet Take 40 mg by mouth daily.    . sacubitril-valsartan (ENTRESTO) 24-26 MG Take 1 tablet by mouth 2 (two) times daily. 60 tablet 6  . traMADol (ULTRAM) 50 MG tablet Take 1 tablet by mouth every  12 (twelve) hours as needed for moderate pain.     . Turmeric 500 MG CAPS Take 1 capsule by mouth daily.    . vitamin E 400 UNIT capsule Take 400 Units by mouth daily.    Marland Kitchen spironolactone (ALDACTONE) 25 MG tablet Take 0.5 tablets (12.5 mg total) by mouth daily. 15 tablet 3   No current facility-administered medications for this encounter.   Filed Vitals:   07/20/14 1014  BP: 124/60  Pulse: 59  Weight: 185 lb 8 oz (84.142 kg)  SpO2: 97%    General: NAD, husband present Neck: JVP 7, no thyromegaly or thyroid nodule.  Lungs: Clear to auscultation bilaterally with normal respiratory effort. CV: Nondisplaced PMI.  Heart regular S1/S2, no S3/S4, 1/6 HSM at apex.  No peripheral edema.  No carotid bruit.  Normal pedal pulses.  Abdomen: Soft, nontender, no hepatosplenomegaly, no distention.  Skin: Intact without lesions or rashes.  Neurologic: Alert and oriented x 3.  Psych: Normal affect. Extremities: No clubbing or cyanosis.   Assessment/Plan:  1. Chronic systolic CHF: Nonischemic cardiomyopathy s/p Medtronic CRT-D. EF 35-40% (6/16). She does have a family history of cardiomyopathy of uncertain etiology (father) but none of her siblings developed a cardiomyopathy.  Familial cardiomyopathy is a consideration.  Viral myocarditis is also a possible etiology.  She is not volume overloaded on exam or by Optivol but still has NYHA class II-III symptoms.  Repeat CPX would be difficult given knee pain.  I think that some of her fatigue and symptomatology may be due to deconditioning and depression.  - I would like her to do cardiac rehab.  I will order this.  - Continue current Lasix regimen.  - Continue Coreg 6.25 mg bid. - Continue Entresto, increase at next appointment.  - Start back on low dose spironolactone, will start 12.5 mg daily with BMET today and again in 10 days.  - Pending sleep study.  2. Depression: Remains depressed.  Will increase Celexa to 40 mg daily.   Loralie Champagne 07/22/2014

## 2014-07-30 ENCOUNTER — Ambulatory Visit (HOSPITAL_COMMUNITY)
Admission: RE | Admit: 2014-07-30 | Discharge: 2014-07-30 | Disposition: A | Discharge status: Home / Self Care | Payer: Medicare HMO | Source: Ambulatory Visit | Attending: Internal Medicine | Admitting: Internal Medicine

## 2014-07-30 ENCOUNTER — Telehealth (HOSPITAL_COMMUNITY): Payer: Self-pay

## 2014-07-30 ENCOUNTER — Encounter (HOSPITAL_COMMUNITY): Payer: Self-pay

## 2014-07-30 VITALS — BP 112/70 | HR 67 | Wt 184.1 lb

## 2014-07-30 DIAGNOSIS — I5022 Chronic systolic (congestive) heart failure: Secondary | ICD-10-CM | POA: Insufficient documentation

## 2014-07-30 LAB — BASIC METABOLIC PANEL
ANION GAP: 8 (ref 5–15)
BUN: 23 mg/dL — AB (ref 6–20)
CHLORIDE: 103 mmol/L (ref 101–111)
CO2: 26 mmol/L (ref 22–32)
Calcium: 9.4 mg/dL (ref 8.9–10.3)
Creatinine, Ser: 1.1 mg/dL — ABNORMAL HIGH (ref 0.44–1.00)
GFR, EST AFRICAN AMERICAN: 53 mL/min — AB (ref >60)
GFR, EST NON AFRICAN AMERICAN: 46 mL/min — AB (ref >60)
Glucose, Bld: 92 mg/dL (ref 65–99)
Potassium: 4.3 mmol/L (ref 3.5–5.1)
Sodium: 137 mmol/L (ref 135–145)

## 2014-07-30 NOTE — Telephone Encounter (Signed)
Patient called stating she "is not feeling right" and feels she may pass out.  BP 100/60, HR 102.  No other s/s or complaints.  Patient coming in today for lab work.  Will check vitals and weight and notify outpatient CHF provider once she is here.  Patient states she is stable to drive to clinic to have lab work drawn.  Renee Pain

## 2014-08-15 ENCOUNTER — Encounter: Payer: Self-pay | Admitting: Cardiology

## 2014-08-27 ENCOUNTER — Ambulatory Visit (HOSPITAL_BASED_OUTPATIENT_CLINIC_OR_DEPARTMENT_OTHER): Payer: Medicare HMO | Attending: Cardiology

## 2014-08-27 VITALS — Ht 67.5 in | Wt 182.0 lb

## 2014-08-27 DIAGNOSIS — I10 Essential (primary) hypertension: Secondary | ICD-10-CM | POA: Diagnosis present

## 2014-08-27 DIAGNOSIS — I493 Ventricular premature depolarization: Secondary | ICD-10-CM | POA: Diagnosis not present

## 2014-08-27 DIAGNOSIS — I5022 Chronic systolic (congestive) heart failure: Secondary | ICD-10-CM

## 2014-08-27 DIAGNOSIS — R001 Bradycardia, unspecified: Secondary | ICD-10-CM | POA: Diagnosis not present

## 2014-08-27 DIAGNOSIS — R0683 Snoring: Secondary | ICD-10-CM | POA: Diagnosis not present

## 2014-08-27 DIAGNOSIS — G4761 Periodic limb movement disorder: Secondary | ICD-10-CM | POA: Insufficient documentation

## 2014-09-03 ENCOUNTER — Ambulatory Visit (INDEPENDENT_AMBULATORY_CARE_PROVIDER_SITE_OTHER): Payer: Medicare HMO | Admitting: *Deleted

## 2014-09-03 DIAGNOSIS — I428 Other cardiomyopathies: Secondary | ICD-10-CM

## 2014-09-03 DIAGNOSIS — I5022 Chronic systolic (congestive) heart failure: Secondary | ICD-10-CM | POA: Diagnosis not present

## 2014-09-03 DIAGNOSIS — I429 Cardiomyopathy, unspecified: Secondary | ICD-10-CM | POA: Diagnosis not present

## 2014-09-04 NOTE — Progress Notes (Signed)
Remote ICD transmission.   

## 2014-09-07 LAB — CUP PACEART REMOTE DEVICE CHECK
Brady Statistic AP VP Percent: 43.96 %
Brady Statistic AP VS Percent: 0.82 %
Brady Statistic AS VP Percent: 53.47 %
Brady Statistic AS VS Percent: 1.75 %
Brady Statistic RV Percent Paced: 93.88 %
HIGH POWER IMPEDANCE MEASURED VALUE: 68 Ohm
Lead Channel Impedance Value: 323 Ohm
Lead Channel Impedance Value: 323 Ohm
Lead Channel Impedance Value: 323 Ohm
Lead Channel Impedance Value: 380 Ohm
Lead Channel Impedance Value: 399 Ohm
Lead Channel Impedance Value: 532 Ohm
Lead Channel Impedance Value: 627 Ohm
Lead Channel Pacing Threshold Amplitude: 1.25 V
Lead Channel Pacing Threshold Pulse Width: 0.4 ms
Lead Channel Pacing Threshold Pulse Width: 0.4 ms
Lead Channel Pacing Threshold Pulse Width: 0.7 ms
Lead Channel Sensing Intrinsic Amplitude: 12.75 mV
Lead Channel Sensing Intrinsic Amplitude: 2.875 mV
Lead Channel Setting Pacing Amplitude: 2 V
Lead Channel Setting Pacing Amplitude: 2.5 V
Lead Channel Setting Pacing Pulse Width: 0.7 ms
MDC IDC MSMT BATTERY REMAINING LONGEVITY: 53 mo
MDC IDC MSMT BATTERY VOLTAGE: 2.96 V
MDC IDC MSMT LEADCHNL LV IMPEDANCE VALUE: 532 Ohm
MDC IDC MSMT LEADCHNL LV IMPEDANCE VALUE: 589 Ohm
MDC IDC MSMT LEADCHNL LV IMPEDANCE VALUE: 589 Ohm
MDC IDC MSMT LEADCHNL RA IMPEDANCE VALUE: 456 Ohm
MDC IDC MSMT LEADCHNL RA PACING THRESHOLD AMPLITUDE: 0.625 V
MDC IDC MSMT LEADCHNL RA SENSING INTR AMPL: 2.875 mV
MDC IDC MSMT LEADCHNL RV IMPEDANCE VALUE: 513 Ohm
MDC IDC MSMT LEADCHNL RV IMPEDANCE VALUE: 627 Ohm
MDC IDC MSMT LEADCHNL RV PACING THRESHOLD AMPLITUDE: 0.375 V
MDC IDC MSMT LEADCHNL RV SENSING INTR AMPL: 12.75 mV
MDC IDC SESS DTM: 20160801103324
MDC IDC SET LEADCHNL RA PACING AMPLITUDE: 1.5 V
MDC IDC SET LEADCHNL RV PACING PULSEWIDTH: 0.4 ms
MDC IDC SET LEADCHNL RV SENSING SENSITIVITY: 0.3 mV
MDC IDC SET ZONE DETECTION INTERVAL: 300 ms
MDC IDC SET ZONE DETECTION INTERVAL: 450 ms
MDC IDC STAT BRADY RA PERCENT PACED: 44.77 %
Zone Setting Detection Interval: 250 ms
Zone Setting Detection Interval: 350 ms

## 2014-09-10 ENCOUNTER — Telehealth (HOSPITAL_COMMUNITY): Payer: Self-pay

## 2014-09-10 NOTE — Telephone Encounter (Signed)
Spoke with patient regarding entrance into Pulmonary Rehab.  Patient has $45.00 co-pay.  Offered maintenance which she states she wants to do. Will follow up with patient to schedule orientation.

## 2014-09-19 ENCOUNTER — Encounter: Payer: Self-pay | Admitting: Cardiology

## 2014-09-19 ENCOUNTER — Telehealth: Payer: Self-pay | Admitting: Cardiology

## 2014-09-19 DIAGNOSIS — G4733 Obstructive sleep apnea (adult) (pediatric): Secondary | ICD-10-CM

## 2014-09-19 NOTE — Telephone Encounter (Signed)
Please let patient know that she did not have enough sleep time to evaluate for sleep apnea.  Please repeat study with Lunesta 2mg  (#1 tablet with no refills) to be taken the night of the sleep study. Please set up repeat sleep study.

## 2014-09-19 NOTE — Sleep Study (Signed)
        SLEEP STUDY   Patient Name: Heather Smith, Heather Smith Date: 08/27/2014 MRN: [id number] Gender: Female D.O.B: [birth year] Age (years): 54 Referring Provider: Loralie Champagne Interpreting Physician: Fransico Him MD, ABSM RPSGT: Madelon Lips  Weight (lbs): 182 Height (inches): 68 BMI: 28 Neck Size: 15.00  CLINICAL INFORMATION  Sleep Study Type: NPSG  Indication for sleep study: Hypertension, Snoring  Epworth Sleepiness Score: 2  SLEEP STUDY TECHNIQUE As per the AASM Manual for the Scoring of Sleep and Associated Events v2.3 (April 2016) with a hypopnea requiring 4% desaturations.  The channels recorded and monitored were frontal, central and occipital EEG, electrooculogram (EOG), submentalis EMG (chin), nasal and oral airflow, thoracic and abdominal wall motion, anterior tibialis EMG, snore microphone, electrocardiogram, and pulse oximetry.  MEDICATIONS Patient's medications include: Coreg, Vit D, Celexa, Co Enzyme Q10, Lasix, Synthroid, MagOx, lasix, Protonix, Entresto, Spironolactone, Ultram. Medications self-administered by patient during sleep study : No sleep medicine administered.  SLEEP ARCHITECTURE The study was initiated at 11:08:46 PM and ended at 5:18:45 AM.  Sleep onset time was 6.7 minutes and the sleep efficiency was reduced at 35.9%. The total sleep time was 133.0 minutes.  Stage REM latency was N/A minutes.  The patient spent 46.62% of the night in stage N1 sleep, 53.38% in stage N2 sleep, 0.00% in stage N3 and 0.00% in REM.  Alpha intrusion was absent.  Supine sleep was 13.17%.  RESPIRATORY PARAMETERS The overall apnea/hypopnea index (AHI) was 0.0 per hour. There were 0 total apneas, including 0 obstructive, 0 central and 0 mixed apneas. There were 0 hypopneas and 4 RERAs.  The AHI during Stage REM sleep was N/A per hour.  AHI while supine was 0.0 per hour.  The mean oxygen saturation was 92.21%. The minimum SpO2 during sleep was 89.00%.  Soft  snoring was noted during this study.  CARDIAC DATA The 2 lead EKG demonstrated pacemaker generated. The mean heart rate was 60.35 beats per minute. Other EKG findings include: PVCs.  LEG MOVEMENT DATA The total PLMS were 249 with a resulting PLMS index of 112.33. Associated arousal with leg movement index was 23.0 .  IMPRESSIONS No significant obstructive sleep apnea occurred during this study (AHI = 0.0/h) but patient had insufficient sleep time for an accurate assessment. No significant central sleep apnea occurred during this study (CAI = 0.0/h). Moderate oxygen desaturation was noted during this study (Min O2 = 89.00%). The patient snored with Soft snoring volume. EKG findings include PVCs. Severe periodic limb movements of sleep occurred during the study. Associated arousals were significant.  DIAGNOSIS Periodic Limb Movement Disorder  RECOMMENDATIONS Recommend repeat PSG with sleep aide as there was insufficient time sleeping for accurate assessment of sleep apnea. Avoid alcohol, sedatives and other CNS depressants that may worsen sleep apnea and disrupt normal sleep architecture. Sleep hygiene should be reviewed to assess factors that may improve sleep quality. Weight management and regular exercise should be initiated or continued if appropriate.  Signed: Fransico Him, MD ABSM Diplomate, American Board of Sleep Medicine 09/18/2014

## 2014-09-24 ENCOUNTER — Telehealth: Payer: Self-pay | Admitting: *Deleted

## 2014-09-24 ENCOUNTER — Encounter: Payer: Self-pay | Admitting: Internal Medicine

## 2014-09-24 NOTE — Telephone Encounter (Signed)
Manuela Schwartz, x ray tech at [facility] office calling for ICD clearance for RF ablation on knee.  Will clear with Dr. Rayann Heman and formulate form to fax.

## 2014-09-25 ENCOUNTER — Encounter: Payer: Self-pay | Admitting: *Deleted

## 2014-09-25 ENCOUNTER — Encounter (HOSPITAL_COMMUNITY)
Admission: RE | Admit: 2014-09-25 | Discharge: 2014-09-25 | Disposition: A | Discharge status: Home / Self Care | Payer: Medicare HMO | Source: Ambulatory Visit | Attending: Cardiology | Admitting: Cardiology

## 2014-09-25 DIAGNOSIS — I509 Heart failure, unspecified: Secondary | ICD-10-CM | POA: Insufficient documentation

## 2014-09-25 NOTE — Progress Notes (Signed)
Yalda completed a Six-Minute Walk Test on 09/25/14 . Akima walked 1,194 feet with 0 breaks.  The patient's lowest oxygen saturation was 88 %, highest heart rate was 62 bpm , and highest blood pressure was 132/60. The patient was on room air. Patient stated that shortness of breath hindered their walk test.

## 2014-09-25 NOTE — Addendum Note (Signed)
Addended by: Andres Ege on: 09/25/2014 11:35 AM   Modules accepted: Orders

## 2014-09-25 NOTE — Telephone Encounter (Signed)
Patient is aware of results and is okay to proceed with Sleep Aid/Repeat Study.   Appointment will be made and sent to her via letter.

## 2014-09-27 ENCOUNTER — Ambulatory Visit (HOSPITAL_COMMUNITY)
Admission: RE | Admit: 2014-09-27 | Discharge: 2014-09-27 | Disposition: A | Discharge status: Home / Self Care | Payer: Medicare HMO | Source: Ambulatory Visit | Attending: Cardiology | Admitting: Cardiology

## 2014-09-27 ENCOUNTER — Encounter (HOSPITAL_COMMUNITY)
Admission: RE | Admit: 2014-09-27 | Discharge: 2014-09-27 | Disposition: A | Discharge status: Home / Self Care | Payer: Medicare HMO | Source: Ambulatory Visit | Attending: Cardiology | Admitting: Cardiology

## 2014-09-27 VITALS — BP 122/54 | HR 65 | Wt 185.5 lb

## 2014-09-27 DIAGNOSIS — Z95810 Presence of automatic (implantable) cardiac defibrillator: Secondary | ICD-10-CM | POA: Diagnosis not present

## 2014-09-27 DIAGNOSIS — M179 Osteoarthritis of knee, unspecified: Secondary | ICD-10-CM | POA: Insufficient documentation

## 2014-09-27 DIAGNOSIS — E785 Hyperlipidemia, unspecified: Secondary | ICD-10-CM | POA: Insufficient documentation

## 2014-09-27 DIAGNOSIS — I5022 Chronic systolic (congestive) heart failure: Secondary | ICD-10-CM | POA: Insufficient documentation

## 2014-09-27 DIAGNOSIS — I428 Other cardiomyopathies: Secondary | ICD-10-CM | POA: Diagnosis not present

## 2014-09-27 DIAGNOSIS — Z8249 Family history of ischemic heart disease and other diseases of the circulatory system: Secondary | ICD-10-CM | POA: Insufficient documentation

## 2014-09-27 DIAGNOSIS — Z79899 Other long term (current) drug therapy: Secondary | ICD-10-CM | POA: Diagnosis not present

## 2014-09-27 DIAGNOSIS — F329 Major depressive disorder, single episode, unspecified: Secondary | ICD-10-CM | POA: Diagnosis not present

## 2014-09-27 MED ORDER — SACUBITRIL-VALSARTAN 49-51 MG PO TABS: 1.0000 | ORAL_TABLET | Freq: Two times a day (BID) | ORAL | Status: DC

## 2014-09-27 NOTE — Patient Instructions (Signed)
Increase Entresto to 49/51 mg Twice daily   Labs in 2 weeks  Your physician has recommended that you have a cardiopulmonary stress test (CPX). CPX testing is a non-invasive measurement of heart and lung function. It replaces a traditional treadmill stress test. This type of test provides a tremendous amount of information that relates not only to your present condition but also for future outcomes. This test combines measurements of you ventilation, respiratory gas exchange in the lungs, electrocardiogram (EKG), blood pressure and physical response before, during, and following an exercise protocol.  Your physician recommends that you schedule a follow-up appointment in: 2 months

## 2014-09-28 NOTE — Progress Notes (Signed)
Patient ID: Heather Smith, female   DOB: 1932-03-13, 81 y.o.   MRN: [id number] PCP: Dr. Kenton Kingfisher HF Cardiology: Aundra Dubin  81 yo with history of nonischemic cardiomyopathy and chronic systolic HF.  Patient has felt like her energy level has been low for several years.  In 9/14, she was admitted to [facility] with chest pain.  Cardiac cath was done, showing no significant coronary disease.  However, echo showed EF 30-35%.  She was started on meds for CHF, and echo was repeated in 12/14, EF was read as 20-25%.  She had a Medtonic CRT-D device placed (had baseline LBBB).  In 3/15, the LV lead was not capturing properly so device had to be reprogrammed.  She had AV optimization in 5/15.  Limited echo at that time showed improvement in EF to 40-45%.  CPX in 5/15 was submaximal but suggested low normal to mildly reduced functional capacity. Last echo in 6/16 showed EF 35-40% with diffuse hypokinesis.    Symptoms are fairly stable since last appointment.  She is short of breath walking up a hill or doing heavy yardwork.  No orthopnea or PND.  No bendopnea.  No chest pain.  No palpitations.  She is generally fatigued.  Never smoked.  Weight stable. She has been doing pulmonary rehab and thinks that this is helping.  She has had a cough.    Optivol: Fluid index < threshold, stable thoracic impedance  Labs (9/14): TSH normal Labs (3/15): K 4.7, creatinine 1.1 Labs (5/15): K 4.9, creatinine 1.1, BNP 44 Labs (6/15): K 5.1, creatinine 1.4, pro-BNP 39 Labs (6/15): K 5.3, creatinine 1.53 Labs (12/15): K 4.4, creatinine 1.03 Labs (5/16): K 4, creatinine 1.0, BNP 34.6 Labs (6/16): K 4.3, creatinine 1.1  PMH: 1. Chronic systolic CHF: Nonischemic cardiomyopathy.  LHC (9/14) with no significant coronary disease, EF 25-30% with global hypokinesis.  Echo (9/14) with EF 30-35%, moderate LV dilation, diffuse hypokinesis.  Echo (12/14) with EF 20-25%.  She does not drink ETOH.  She had Medtronic CRT-D device implantation in  1/15. Echo (5/15) with EF 40-45%, mild LVH.  CPX (5/15) was submaximal with RER 0.99, peak VO2 14, VE/VCO2 37.3; low normal functional capacity may be limited by loss of BiV pacing at higher HR and by deconditioning but interpretation somewhat limited since study was submaximal.  Echo (6/16) with EF 35-40%, severe LV dilation, diffuse hypokinesis, mild MR.  2. Back pain s/p lumbar laminectomy 3. OA: Knees.  4. Hyperlipidemia 5. Depression 5. Depression  SH: Lives [place], married, never smoked, no ETOH.    FH: Mother with MI at 49, possible ischemic cardiomyopathy.  Father with cardiomyopathy, died at 24.  She has 5 brothers, none with significant coronary disease.    ROS: All systems reviewed and negative except as per HPI.   Current Outpatient Prescriptions  Medication Sig Dispense Refill  . Ascorbic Acid (VITAMIN C) 1000 MG tablet Take 1,000 mg by mouth daily.     . B Complex Vitamins (VITAMIN B COMPLEX PO) Take 1 tablet by mouth daily.    . Biotin (BIOTIN 5000) 5 MG CAPS Take 5 mg by mouth daily.     . carvedilol (COREG) 6.25 MG tablet Take 1 tablet (6.25 mg total) by mouth 2 (two) times daily with a meal. 180 tablet 3  . cholecalciferol (VITAMIN D) 1000 UNITS tablet Take 1,000 Units by mouth daily.      . citalopram (CELEXA) 20 MG tablet Take 1 tablet by mouth daily.    Marland Kitchen  Coenzyme Q10 (COQ10) 100 MG CAPS Take 1 capsule by mouth daily.    . furosemide (LASIX) 20 MG tablet TAKE ONE TABLET BY MOUTH ON MONDAY, WEDNESDAY AN FRIDAY. 12 tablet 6  . levothyroxine (SYNTHROID, LEVOTHROID) 50 MCG tablet Take 50 mcg by mouth daily.      . Magnesium Oxide (MAG-200 PO) Take 1 tablet by mouth 2 (two) times daily.     . multivitamin (THERAGRAN) per tablet Take 1 tablet by mouth daily.      . pantoprazole (PROTONIX) 40 MG tablet Take 40 mg by mouth daily.    Marland Kitchen spironolactone (ALDACTONE) 25 MG tablet Take 0.5 tablets (12.5 mg total) by mouth daily. 15 tablet 3  . traMADol (ULTRAM) 50 MG tablet  Take 1 tablet by mouth every 12 (twelve) hours as needed for moderate pain.     . Turmeric 500 MG CAPS Take 1 capsule by mouth daily.    . vitamin E 400 UNIT capsule Take 400 Units by mouth daily.    . sacubitril-valsartan (ENTRESTO) 49-51 MG Take 1 tablet by mouth 2 (two) times daily. 60 tablet 3   No current facility-administered medications for this encounter.   Filed Vitals:   09/27/14 1220  BP: 122/54  Pulse: 65  Weight: 185 lb 8 oz (84.142 kg)  SpO2: 96%    General: NAD, husband present Neck: JVP 7, no thyromegaly or thyroid nodule.  Lungs: Clear to auscultation bilaterally with normal respiratory effort. CV: Nondisplaced PMI.  Heart regular S1/S2, no S3/S4, 1/6 HSM at apex.  No peripheral edema.  No carotid bruit.  Normal pedal pulses.  Abdomen: Soft, nontender, no hepatosplenomegaly, no distention.  Skin: Intact without lesions or rashes.  Neurologic: Alert and oriented x 3.  Psych: Normal affect. Extremities: No clubbing or cyanosis.   Assessment/Plan:  1. Chronic systolic CHF: Nonischemic cardiomyopathy s/p Medtronic CRT-D. EF 35-40% (6/16). She does have a family history of cardiomyopathy of uncertain etiology (father) but none of her siblings developed a cardiomyopathy.  Familial cardiomyopathy is a consideration.  Viral myocarditis is also a possible etiology.  She is not volume overloaded on exam or by Optivol but still has NYHA class II-III symptoms.  I think that some of her fatigue and symptomatology may be due to deconditioning and depression.  - Continue pulmonary rehab.  - Continue current Lasix regimen.  - Continue Coreg 6.25 mg bid and spironolactone. - Increase Entresto to 49/51 bid. BMET in 2 wks.   - Sleep study needs to be repeated with sleeping aid.   - Repeat CPX (can use bike) to see if there has been improvement.  2. Depression: Remains depressed.  Continue Celexa.   Loralie Champagne 09/28/2014

## 2014-10-02 ENCOUNTER — Other Ambulatory Visit (HOSPITAL_COMMUNITY): Payer: Self-pay | Admitting: Internal Medicine

## 2014-10-02 ENCOUNTER — Telehealth (HOSPITAL_COMMUNITY): Payer: Self-pay | Admitting: *Deleted

## 2014-10-02 ENCOUNTER — Encounter (HOSPITAL_COMMUNITY): Admission: RE | Admit: 2014-10-02 | Payer: Medicare HMO | Source: Ambulatory Visit

## 2014-10-03 ENCOUNTER — Encounter: Payer: Self-pay | Admitting: Cardiology

## 2014-10-04 ENCOUNTER — Encounter (HOSPITAL_COMMUNITY): Payer: Medicare HMO

## 2014-10-04 DIAGNOSIS — I509 Heart failure, unspecified: Secondary | ICD-10-CM | POA: Insufficient documentation

## 2014-10-09 ENCOUNTER — Encounter (HOSPITAL_COMMUNITY): Payer: Self-pay

## 2014-10-10 ENCOUNTER — Ambulatory Visit (HOSPITAL_COMMUNITY)
Admission: RE | Admit: 2014-10-10 | Discharge: 2014-10-10 | Disposition: A | Discharge status: Home / Self Care | Payer: Medicare HMO | Source: Ambulatory Visit | Attending: Internal Medicine | Admitting: Internal Medicine

## 2014-10-10 DIAGNOSIS — I429 Cardiomyopathy, unspecified: Secondary | ICD-10-CM | POA: Diagnosis not present

## 2014-10-10 DIAGNOSIS — I428 Other cardiomyopathies: Secondary | ICD-10-CM

## 2014-10-10 DIAGNOSIS — I5022 Chronic systolic (congestive) heart failure: Secondary | ICD-10-CM | POA: Diagnosis not present

## 2014-10-10 LAB — BASIC METABOLIC PANEL
Anion gap: 8 (ref 5–15)
BUN: 22 mg/dL — ABNORMAL HIGH (ref 6–20)
CALCIUM: 9.3 mg/dL (ref 8.9–10.3)
CO2: 29 mmol/L (ref 22–32)
Chloride: 99 mmol/L — ABNORMAL LOW (ref 101–111)
Creatinine, Ser: 1.16 mg/dL — ABNORMAL HIGH (ref 0.44–1.00)
GFR calc non Af Amer: 43 mL/min — ABNORMAL LOW (ref >60)
GFR, EST AFRICAN AMERICAN: 50 mL/min — AB (ref >60)
Glucose, Bld: 93 mg/dL (ref 65–99)
Potassium: 4.4 mmol/L (ref 3.5–5.1)
SODIUM: 136 mmol/L (ref 135–145)

## 2014-10-11 ENCOUNTER — Encounter (HOSPITAL_COMMUNITY)
Admission: RE | Admit: 2014-10-11 | Discharge: 2014-10-11 | Disposition: A | Discharge status: Home / Self Care | Payer: Self-pay | Source: Ambulatory Visit | Attending: Cardiology | Admitting: Cardiology

## 2014-10-16 ENCOUNTER — Encounter (HOSPITAL_COMMUNITY)
Admission: RE | Admit: 2014-10-16 | Discharge: 2014-10-16 | Disposition: A | Discharge status: Home / Self Care | Payer: Self-pay | Source: Ambulatory Visit | Attending: Cardiology | Admitting: Cardiology

## 2014-10-17 ENCOUNTER — Ambulatory Visit (HOSPITAL_COMMUNITY): Payer: Medicare HMO | Attending: Family Medicine

## 2014-10-17 DIAGNOSIS — I428 Other cardiomyopathies: Secondary | ICD-10-CM | POA: Diagnosis not present

## 2014-10-17 DIAGNOSIS — Z95810 Presence of automatic (implantable) cardiac defibrillator: Secondary | ICD-10-CM | POA: Insufficient documentation

## 2014-10-17 DIAGNOSIS — I5022 Chronic systolic (congestive) heart failure: Secondary | ICD-10-CM | POA: Diagnosis not present

## 2014-10-18 ENCOUNTER — Encounter (HOSPITAL_COMMUNITY)
Admission: RE | Admit: 2014-10-18 | Discharge: 2014-10-18 | Disposition: A | Discharge status: Home / Self Care | Payer: Self-pay | Source: Ambulatory Visit | Attending: Cardiology | Admitting: Cardiology

## 2014-10-19 DIAGNOSIS — I5022 Chronic systolic (congestive) heart failure: Secondary | ICD-10-CM | POA: Diagnosis not present

## 2014-10-23 ENCOUNTER — Encounter (HOSPITAL_COMMUNITY)
Admission: RE | Admit: 2014-10-23 | Discharge: 2014-10-23 | Disposition: A | Discharge status: Home / Self Care | Payer: Self-pay | Source: Ambulatory Visit | Attending: Cardiology | Admitting: Cardiology

## 2014-10-25 ENCOUNTER — Encounter (HOSPITAL_COMMUNITY)
Admission: RE | Admit: 2014-10-25 | Discharge: 2014-10-25 | Disposition: A | Discharge status: Home / Self Care | Payer: Self-pay | Source: Ambulatory Visit | Attending: Cardiology | Admitting: Cardiology

## 2014-10-26 ENCOUNTER — Other Ambulatory Visit (HOSPITAL_COMMUNITY): Payer: Self-pay | Admitting: Internal Medicine

## 2014-10-30 ENCOUNTER — Encounter (HOSPITAL_COMMUNITY)
Admission: RE | Admit: 2014-10-30 | Discharge: 2014-10-30 | Disposition: A | Discharge status: Home / Self Care | Payer: Self-pay | Source: Ambulatory Visit | Attending: Cardiology | Admitting: Cardiology

## 2014-11-01 ENCOUNTER — Encounter (HOSPITAL_COMMUNITY)
Admission: RE | Admit: 2014-11-01 | Discharge: 2014-11-01 | Disposition: A | Discharge status: Home / Self Care | Payer: Self-pay | Source: Ambulatory Visit | Attending: Cardiology | Admitting: Cardiology

## 2014-11-05 DIAGNOSIS — M25562 Pain in left knee: Secondary | ICD-10-CM | POA: Diagnosis not present

## 2014-11-06 ENCOUNTER — Encounter (HOSPITAL_COMMUNITY)
Admission: RE | Admit: 2014-11-06 | Discharge: 2014-11-06 | Disposition: A | Discharge status: Home / Self Care | Payer: Self-pay | Source: Ambulatory Visit | Attending: Cardiology | Admitting: Cardiology

## 2014-11-06 DIAGNOSIS — I509 Heart failure, unspecified: Secondary | ICD-10-CM | POA: Insufficient documentation

## 2014-11-08 ENCOUNTER — Encounter (HOSPITAL_COMMUNITY)
Admission: RE | Admit: 2014-11-08 | Discharge: 2014-11-08 | Disposition: A | Discharge status: Home / Self Care | Payer: Self-pay | Source: Ambulatory Visit | Attending: Cardiology | Admitting: Cardiology

## 2014-11-08 ENCOUNTER — Encounter: Payer: Self-pay | Admitting: Internal Medicine

## 2014-11-13 ENCOUNTER — Encounter (HOSPITAL_COMMUNITY)
Admission: RE | Admit: 2014-11-13 | Discharge: 2014-11-13 | Disposition: A | Discharge status: Home / Self Care | Payer: Self-pay | Source: Ambulatory Visit | Attending: Cardiology | Admitting: Cardiology

## 2014-11-14 ENCOUNTER — Other Ambulatory Visit: Payer: Self-pay | Admitting: Family Medicine

## 2014-11-14 DIAGNOSIS — R202 Paresthesia of skin: Secondary | ICD-10-CM

## 2014-11-14 DIAGNOSIS — Z23 Encounter for immunization: Secondary | ICD-10-CM | POA: Diagnosis not present

## 2014-11-15 ENCOUNTER — Encounter (HOSPITAL_COMMUNITY): Payer: Self-pay

## 2014-11-20 ENCOUNTER — Other Ambulatory Visit: Payer: Medicare HMO

## 2014-11-20 ENCOUNTER — Encounter (HOSPITAL_COMMUNITY)
Admission: RE | Admit: 2014-11-20 | Discharge: 2014-11-20 | Disposition: A | Discharge status: Home / Self Care | Payer: Self-pay | Source: Ambulatory Visit | Attending: Cardiology | Admitting: Cardiology

## 2014-11-22 ENCOUNTER — Encounter (HOSPITAL_COMMUNITY)
Admission: RE | Admit: 2014-11-22 | Discharge: 2014-11-22 | Disposition: A | Discharge status: Home / Self Care | Payer: Self-pay | Source: Ambulatory Visit | Attending: Cardiology | Admitting: Cardiology

## 2014-11-23 ENCOUNTER — Ambulatory Visit
Admission: RE | Admit: 2014-11-23 | Discharge: 2014-11-23 | Disposition: A | Discharge status: Home / Self Care | Payer: Medicare HMO | Source: Ambulatory Visit | Attending: Family Medicine | Admitting: Family Medicine

## 2014-11-23 DIAGNOSIS — R202 Paresthesia of skin: Secondary | ICD-10-CM | POA: Diagnosis not present

## 2014-11-26 ENCOUNTER — Other Ambulatory Visit (HOSPITAL_COMMUNITY): Payer: Self-pay | Admitting: Internal Medicine

## 2014-11-27 ENCOUNTER — Encounter (HOSPITAL_COMMUNITY)
Admission: RE | Admit: 2014-11-27 | Discharge: 2014-11-27 | Disposition: A | Discharge status: Home / Self Care | Payer: Self-pay | Source: Ambulatory Visit | Attending: Cardiology | Admitting: Cardiology

## 2014-11-28 ENCOUNTER — Ambulatory Visit (HOSPITAL_COMMUNITY)
Admission: RE | Admit: 2014-11-28 | Discharge: 2014-11-28 | Disposition: A | Discharge status: Home / Self Care | Payer: Medicare HMO | Source: Ambulatory Visit | Attending: Cardiology | Admitting: Cardiology

## 2014-11-28 ENCOUNTER — Encounter (HOSPITAL_COMMUNITY): Payer: Self-pay

## 2014-11-28 VITALS — BP 110/68 | HR 79 | Wt 190.0 lb

## 2014-11-28 DIAGNOSIS — M179 Osteoarthritis of knee, unspecified: Secondary | ICD-10-CM | POA: Insufficient documentation

## 2014-11-28 DIAGNOSIS — I428 Other cardiomyopathies: Secondary | ICD-10-CM | POA: Insufficient documentation

## 2014-11-28 DIAGNOSIS — Z8249 Family history of ischemic heart disease and other diseases of the circulatory system: Secondary | ICD-10-CM | POA: Insufficient documentation

## 2014-11-28 DIAGNOSIS — Z79899 Other long term (current) drug therapy: Secondary | ICD-10-CM | POA: Diagnosis not present

## 2014-11-28 DIAGNOSIS — Z95810 Presence of automatic (implantable) cardiac defibrillator: Secondary | ICD-10-CM | POA: Insufficient documentation

## 2014-11-28 DIAGNOSIS — I5022 Chronic systolic (congestive) heart failure: Secondary | ICD-10-CM | POA: Diagnosis not present

## 2014-11-28 DIAGNOSIS — F329 Major depressive disorder, single episode, unspecified: Secondary | ICD-10-CM | POA: Insufficient documentation

## 2014-11-28 DIAGNOSIS — R69 Illness, unspecified: Secondary | ICD-10-CM | POA: Diagnosis not present

## 2014-11-28 DIAGNOSIS — E785 Hyperlipidemia, unspecified: Secondary | ICD-10-CM | POA: Insufficient documentation

## 2014-11-28 MED ORDER — CARVEDILOL 6.25 MG PO TABS: 9.3750 mg | ORAL_TABLET | Freq: Two times a day (BID) | ORAL | Status: DC

## 2014-11-28 MED ORDER — ZOLPIDEM TARTRATE 5 MG PO TABS: 5.0000 mg | ORAL_TABLET | Freq: Every evening | ORAL | Status: DC | PRN

## 2014-11-28 MED ORDER — SPIRONOLACTONE 25 MG PO TABS: 25.0000 mg | ORAL_TABLET | Freq: Every day | ORAL | Status: DC

## 2014-11-28 NOTE — Progress Notes (Signed)
Patient ID: Heather Smith, female   DOB: 1932-05-12, 81 y.o.   MRN: [id number] PCP: Dr. Kenton Kingfisher HF Cardiology: Aundra Dubin  81 yo with history of nonischemic cardiomyopathy and chronic systolic HF.  Patient has felt like her energy level has been low for several years.  In 9/14, she was admitted to [facility] with chest pain.  Cardiac cath was done, showing no significant coronary disease.  However, echo showed EF 30-35%.  She was started on meds for CHF, and echo was repeated in 12/14, EF was read as 20-25%.  She had a Medtonic CRT-D device placed (had baseline LBBB).  In 3/15, the LV lead was not capturing properly so device had to be reprogrammed.  She had AV optimization in 5/15.  Limited echo at that time showed improvement in EF to 40-45%.  CPX in 5/15 was submaximal but suggested low normal to mildly reduced functional capacity. Last echo in 6/16 showed EF 35-40% with diffuse hypokinesis.  CPX was done in 9/16.  This actually showed good functional capacity.   Symptoms are fairly stable since last appointment.  She is short of breath walking up a hill or after walking about a block.  No orthopnea or PND.  No bendopnea.  No chest pain.  No palpitations.  She is generally fatigued.  Never smoked.  Weight stable. She has been doing pulmonary rehab and thinks that this is helping.   Optivol: Fluid index < threshold, stable thoracic impedance  Labs (9/14): TSH normal Labs (3/15): K 4.7, creatinine 1.1 Labs (5/15): K 4.9, creatinine 1.1, BNP 44 Labs (6/15): K 5.1, creatinine 1.4, pro-BNP 39 Labs (6/15): K 5.3, creatinine 1.53 Labs (12/15): K 4.4, creatinine 1.03 Labs (5/16): K 4, creatinine 1.0, BNP 34.6 Labs (6/16): K 4.3, creatinine 1.1 Labs (9/16): K 4.4, creatinine 1.16  PMH: 1. Chronic systolic CHF: Nonischemic cardiomyopathy.  LHC (9/14) with no significant coronary disease, EF 25-30% with global hypokinesis.  Echo (9/14) with EF 30-35%, moderate LV dilation, diffuse hypokinesis.  Echo (12/14)  with EF 20-25%.  She does not drink ETOH.  She had Medtronic CRT-D device implantation in 1/15. Echo (5/15) with EF 40-45%, mild LVH.  CPX (5/15) was submaximal with RER 0.99, peak VO2 14, VE/VCO2 37.3; low normal functional capacity may be limited by loss of BiV pacing at higher HR and by deconditioning but interpretation somewhat limited since study was submaximal.  Echo (6/16) with EF 35-40%, severe LV dilation, diffuse hypokinesis, mild MR.  CPX (9/16) with peak VO2 17.7, VE/VCO2 slope 32.3, RER 1.11 => excellent functional capacity.  2. Back pain s/p lumbar laminectomy 3. OA: Knees.  4. Hyperlipidemia 5. Depression  SH: [place], married, never smoked, no ETOH.    FH: Mother with MI at 33, possible ischemic cardiomyopathy.  Father with cardiomyopathy, died at 6.  She has 5 brothers, none with significant coronary disease.    ROS: All systems reviewed and negative except as per HPI.   Current Outpatient Prescriptions  Medication Sig Dispense Refill  . Ascorbic Acid (VITAMIN C) 1000 MG tablet Take 1,000 mg by mouth daily.     . B Complex Vitamins (VITAMIN B COMPLEX PO) Take 1 tablet by mouth daily.    . Biotin (BIOTIN 5000) 5 MG CAPS Take 5 mg by mouth daily.     . cholecalciferol (VITAMIN D) 1000 UNITS tablet Take 1,000 Units by mouth daily.      . citalopram (CELEXA) 20 MG tablet Take 1 tablet by mouth daily.    Marland Kitchen  Coenzyme Q10 (COQ10) 100 MG CAPS Take 1 capsule by mouth daily.    . furosemide (LASIX) 20 MG tablet TAKE ONE TABLET BY MOUTH ONCE DAILY ON MONDAY, WEDNESDAY AND FRIDAY 12 tablet 0  . levothyroxine (SYNTHROID, LEVOTHROID) 50 MCG tablet Take 50 mcg by mouth daily.      . Magnesium Oxide (MAG-200 PO) Take 1 tablet by mouth 2 (two) times daily.     . multivitamin (THERAGRAN) per tablet Take 1 tablet by mouth daily.      . pantoprazole (PROTONIX) 40 MG tablet Take 40 mg by mouth daily.    . sacubitril-valsartan (ENTRESTO) 49-51 MG Take 1 tablet by mouth 2 (two) times  daily. 60 tablet 3  . traMADol (ULTRAM) 50 MG tablet Take 1 tablet by mouth every 12 (twelve) hours as needed for moderate pain.     . Turmeric 500 MG CAPS Take 1 capsule by mouth daily.    . vitamin E 400 UNIT capsule Take 400 Units by mouth daily.    . carvedilol (COREG) 6.25 MG tablet Take 1.5 tablets (9.375 mg total) by mouth 2 (two) times daily. 180 tablet 3  . spironolactone (ALDACTONE) 25 MG tablet Take 1 tablet (25 mg total) by mouth daily. 90 tablet 3  . zolpidem (AMBIEN) 5 MG tablet Take 1 tablet (5 mg total) by mouth at bedtime as needed for sleep. 15 tablet 0   No current facility-administered medications for this encounter.   Filed Vitals:   11/28/14 1359  BP: 110/68  Pulse: 79  Weight: 190 lb (86.183 kg)  SpO2: 96%    General: NAD, husband present Neck: JVP 7, no thyromegaly or thyroid nodule.  Lungs: Clear to auscultation bilaterally with normal respiratory effort. CV: Nondisplaced PMI.  Heart regular S1/S2, no S3/S4, 1/6 HSM at apex.  No peripheral edema.  No carotid bruit.  Normal pedal pulses.  Abdomen: Soft, nontender, no hepatosplenomegaly, no distention.  Skin: Intact without lesions or rashes.  Neurologic: Alert and oriented x 3.  Psych: Normal affect. Extremities: No clubbing or cyanosis.   Assessment/Plan:  1. Chronic systolic CHF: Nonischemic cardiomyopathy s/p Medtronic CRT-D. EF 35-40% (6/16). She does have a family history of cardiomyopathy of uncertain etiology (father) but none of her siblings developed a cardiomyopathy.  Familial cardiomyopathy is a consideration.  Viral myocarditis is also a possible etiology.  She is not volume overloaded on exam or by Optivol but still has NYHA class II-III symptoms.  I think that some of her fatigue and symptomatology may be due to deconditioning and depression.  Interestingly, her recent CPX showed excellent functional capacity.  - Continue pulmonary rehab.  - Continue current Lasix regimen.  - Increase  spironolactone to 25 mg daily with BMET in 2 wks.  - Increase Coreg to 9.375 mg bid.  - Continue Entresto 49/51 bid.  2. Depression: Remains depressed.  Continue Celexa.   Followup in 3 months.   Loralie Champagne 11/28/2014

## 2014-11-28 NOTE — Progress Notes (Signed)
REDS VEST READING= 28 CHEST RULER=11  VEST FITTING TASKS: POSTURE=STANDING  HEIGHT MARKER=T CENTER STRIP=ALIGNED WITH SPINE  COMMENTS:NONE

## 2014-11-28 NOTE — Patient Instructions (Signed)
Increase Spironolactone 25 mg one tablet daily  Increase Coreg 9.375 mg twice a day  Ambien 5 mg as needed for sleep   Labs in 2 weeks   Follow up in 2 months

## 2014-11-29 ENCOUNTER — Encounter (HOSPITAL_COMMUNITY)
Admission: RE | Admit: 2014-11-29 | Discharge: 2014-11-29 | Disposition: A | Discharge status: Home / Self Care | Payer: Self-pay | Source: Ambulatory Visit | Attending: Cardiology | Admitting: Cardiology

## 2014-12-04 ENCOUNTER — Encounter (HOSPITAL_COMMUNITY)
Admission: RE | Admit: 2014-12-04 | Discharge: 2014-12-04 | Disposition: A | Discharge status: Home / Self Care | Payer: Self-pay | Source: Ambulatory Visit | Attending: Cardiology | Admitting: Cardiology

## 2014-12-04 DIAGNOSIS — I509 Heart failure, unspecified: Secondary | ICD-10-CM | POA: Insufficient documentation

## 2014-12-05 ENCOUNTER — Ambulatory Visit (INDEPENDENT_AMBULATORY_CARE_PROVIDER_SITE_OTHER): Payer: Medicare HMO | Admitting: *Deleted

## 2014-12-05 DIAGNOSIS — I429 Cardiomyopathy, unspecified: Secondary | ICD-10-CM | POA: Diagnosis not present

## 2014-12-05 DIAGNOSIS — I428 Other cardiomyopathies: Secondary | ICD-10-CM

## 2014-12-05 DIAGNOSIS — I5022 Chronic systolic (congestive) heart failure: Secondary | ICD-10-CM

## 2014-12-06 ENCOUNTER — Encounter: Payer: Self-pay | Admitting: Cardiology

## 2014-12-06 ENCOUNTER — Encounter (HOSPITAL_COMMUNITY)
Admission: RE | Admit: 2014-12-06 | Discharge: 2014-12-06 | Disposition: A | Discharge status: Home / Self Care | Payer: Self-pay | Source: Ambulatory Visit | Attending: Cardiology | Admitting: Cardiology

## 2014-12-06 NOTE — Progress Notes (Signed)
Remote ICD transmission.   

## 2014-12-11 ENCOUNTER — Encounter (HOSPITAL_BASED_OUTPATIENT_CLINIC_OR_DEPARTMENT_OTHER): Payer: Medicare HMO

## 2014-12-11 ENCOUNTER — Encounter (HOSPITAL_COMMUNITY)
Admission: RE | Admit: 2014-12-11 | Discharge: 2014-12-11 | Disposition: A | Discharge status: Home / Self Care | Payer: Self-pay | Source: Ambulatory Visit | Attending: Cardiology | Admitting: Cardiology

## 2014-12-12 ENCOUNTER — Ambulatory Visit (HOSPITAL_COMMUNITY)
Admission: RE | Admit: 2014-12-12 | Discharge: 2014-12-12 | Disposition: A | Discharge status: Home / Self Care | Payer: Medicare HMO | Source: Ambulatory Visit | Attending: Internal Medicine | Admitting: Internal Medicine

## 2014-12-12 DIAGNOSIS — I5022 Chronic systolic (congestive) heart failure: Secondary | ICD-10-CM | POA: Diagnosis not present

## 2014-12-12 LAB — BASIC METABOLIC PANEL
Anion gap: 9 (ref 5–15)
BUN: 20 mg/dL (ref 6–20)
CO2: 26 mmol/L (ref 22–32)
CREATININE: 1.16 mg/dL — AB (ref 0.44–1.00)
Calcium: 9.7 mg/dL (ref 8.9–10.3)
Chloride: 105 mmol/L (ref 101–111)
GFR calc non Af Amer: 43 mL/min — ABNORMAL LOW (ref >60)
GFR, EST AFRICAN AMERICAN: 50 mL/min — AB (ref >60)
Glucose, Bld: 106 mg/dL — ABNORMAL HIGH (ref 65–99)
Potassium: 4.5 mmol/L (ref 3.5–5.1)
SODIUM: 140 mmol/L (ref 135–145)

## 2014-12-13 ENCOUNTER — Encounter (HOSPITAL_COMMUNITY)
Admission: RE | Admit: 2014-12-13 | Discharge: 2014-12-13 | Disposition: A | Discharge status: Home / Self Care | Payer: Self-pay | Source: Ambulatory Visit | Attending: Cardiology | Admitting: Cardiology

## 2014-12-18 ENCOUNTER — Encounter (HOSPITAL_COMMUNITY): Payer: Self-pay

## 2014-12-20 ENCOUNTER — Encounter (HOSPITAL_COMMUNITY): Payer: Self-pay

## 2014-12-21 LAB — CUP PACEART REMOTE DEVICE CHECK
Battery Remaining Longevity: 51 mo
Battery Voltage: 2.97 V
Brady Statistic AP VP Percent: 52.98 %
Brady Statistic AS VP Percent: 44.6 %
Brady Statistic RA Percent Paced: 53.95 %
HIGH POWER IMPEDANCE MEASURED VALUE: 74 Ohm
Implantable Lead Implant Date: 20150121
Implantable Lead Location: 753860
Lead Channel Impedance Value: 342 Ohm
Lead Channel Impedance Value: 342 Ohm
Lead Channel Impedance Value: 437 Ohm
Lead Channel Impedance Value: 437 Ohm
Lead Channel Impedance Value: 513 Ohm
Lead Channel Impedance Value: 513 Ohm
Lead Channel Impedance Value: 532 Ohm
Lead Channel Impedance Value: 532 Ohm
Lead Channel Impedance Value: 646 Ohm
Lead Channel Pacing Threshold Amplitude: 0.5 V
Lead Channel Pacing Threshold Amplitude: 1.375 V
Lead Channel Pacing Threshold Pulse Width: 0.4 ms
Lead Channel Sensing Intrinsic Amplitude: 1.875 mV
Lead Channel Sensing Intrinsic Amplitude: 15.625 mV
Lead Channel Sensing Intrinsic Amplitude: 15.625 mV
Lead Channel Setting Pacing Amplitude: 1.5 V
Lead Channel Setting Pacing Amplitude: 2 V
Lead Channel Setting Pacing Amplitude: 2.5 V
Lead Channel Setting Pacing Pulse Width: 0.4 ms
Lead Channel Setting Pacing Pulse Width: 0.7 ms
MDC IDC LEAD IMPLANT DT: 20150121
MDC IDC LEAD IMPLANT DT: 20150121
MDC IDC LEAD LOCATION: 753858
MDC IDC LEAD LOCATION: 753859
MDC IDC LEAD MODEL: 4298
MDC IDC MSMT LEADCHNL LV IMPEDANCE VALUE: 304 Ohm
MDC IDC MSMT LEADCHNL LV IMPEDANCE VALUE: 342 Ohm
MDC IDC MSMT LEADCHNL LV IMPEDANCE VALUE: 570 Ohm
MDC IDC MSMT LEADCHNL LV IMPEDANCE VALUE: 589 Ohm
MDC IDC MSMT LEADCHNL LV PACING THRESHOLD PULSEWIDTH: 0.7 ms
MDC IDC MSMT LEADCHNL RA PACING THRESHOLD AMPLITUDE: 0.625 V
MDC IDC MSMT LEADCHNL RA SENSING INTR AMPL: 1.875 mV
MDC IDC MSMT LEADCHNL RV PACING THRESHOLD PULSEWIDTH: 0.4 ms
MDC IDC SESS DTM: 20161102041703
MDC IDC SET LEADCHNL RV SENSING SENSITIVITY: 0.3 mV
MDC IDC STAT BRADY AP VS PERCENT: 0.97 %
MDC IDC STAT BRADY AS VS PERCENT: 1.45 %
MDC IDC STAT BRADY RV PERCENT PACED: 92.22 %

## 2014-12-24 NOTE — Telephone Encounter (Signed)
Patient's insurance company denied repeat study.   Per Dr. Radford Pax, we could do a Home sleep Study -   Patient stated at this time she was not going to do anything.   She was instructed to think about it and let me know if she changed her mind and I would get her information set up with [place]

## 2014-12-25 ENCOUNTER — Encounter: Payer: Self-pay | Admitting: Cardiology

## 2014-12-25 ENCOUNTER — Other Ambulatory Visit (HOSPITAL_COMMUNITY): Payer: Self-pay | Admitting: Internal Medicine

## 2014-12-25 ENCOUNTER — Encounter (HOSPITAL_COMMUNITY)
Admission: RE | Admit: 2014-12-25 | Discharge: 2014-12-25 | Disposition: A | Discharge status: Home / Self Care | Payer: Self-pay | Source: Ambulatory Visit | Attending: Cardiology | Admitting: Cardiology

## 2014-12-27 ENCOUNTER — Encounter (HOSPITAL_COMMUNITY): Payer: Self-pay

## 2015-01-01 ENCOUNTER — Encounter (HOSPITAL_COMMUNITY)
Admission: RE | Admit: 2015-01-01 | Discharge: 2015-01-01 | Disposition: A | Discharge status: Home / Self Care | Payer: Self-pay | Source: Ambulatory Visit | Attending: Cardiology | Admitting: Cardiology

## 2015-01-03 ENCOUNTER — Encounter (HOSPITAL_COMMUNITY)
Admission: RE | Admit: 2015-01-03 | Discharge: 2015-01-03 | Disposition: A | Discharge status: Home / Self Care | Payer: Self-pay | Source: Ambulatory Visit | Attending: Cardiology | Admitting: Cardiology

## 2015-01-03 DIAGNOSIS — I509 Heart failure, unspecified: Secondary | ICD-10-CM | POA: Insufficient documentation

## 2015-01-08 ENCOUNTER — Encounter (HOSPITAL_COMMUNITY)
Admission: RE | Admit: 2015-01-08 | Discharge: 2015-01-08 | Disposition: A | Discharge status: Home / Self Care | Payer: Medicare HMO | Source: Ambulatory Visit | Attending: Cardiology | Admitting: Cardiology

## 2015-01-10 ENCOUNTER — Encounter (HOSPITAL_COMMUNITY)
Admission: RE | Admit: 2015-01-10 | Discharge: 2015-01-10 | Disposition: A | Discharge status: Home / Self Care | Payer: Self-pay | Source: Ambulatory Visit | Attending: Cardiology | Admitting: Cardiology

## 2015-01-10 ENCOUNTER — Other Ambulatory Visit: Payer: Self-pay | Admitting: Cardiology

## 2015-01-15 ENCOUNTER — Telehealth (HOSPITAL_COMMUNITY): Payer: Self-pay

## 2015-01-15 ENCOUNTER — Telehealth: Payer: Self-pay | Admitting: Cardiovascular Disease

## 2015-01-15 ENCOUNTER — Telehealth: Payer: Self-pay | Admitting: *Deleted

## 2015-01-15 ENCOUNTER — Ambulatory Visit (INDEPENDENT_AMBULATORY_CARE_PROVIDER_SITE_OTHER): Payer: Medicare HMO | Admitting: *Deleted

## 2015-01-15 ENCOUNTER — Encounter (HOSPITAL_COMMUNITY)
Admission: RE | Admit: 2015-01-15 | Discharge: 2015-01-15 | Disposition: A | Discharge status: Home / Self Care | Payer: Self-pay | Source: Ambulatory Visit | Attending: Cardiology | Admitting: Cardiology

## 2015-01-15 ENCOUNTER — Encounter: Payer: Self-pay | Admitting: Internal Medicine

## 2015-01-15 DIAGNOSIS — I447 Left bundle-branch block, unspecified: Secondary | ICD-10-CM

## 2015-01-15 DIAGNOSIS — I429 Cardiomyopathy, unspecified: Secondary | ICD-10-CM

## 2015-01-15 DIAGNOSIS — I493 Ventricular premature depolarization: Secondary | ICD-10-CM

## 2015-01-15 DIAGNOSIS — I5022 Chronic systolic (congestive) heart failure: Secondary | ICD-10-CM

## 2015-01-15 DIAGNOSIS — I428 Other cardiomyopathies: Secondary | ICD-10-CM

## 2015-01-15 LAB — CUP PACEART INCLINIC DEVICE CHECK
Brady Statistic AP VP Percent: 48.56 %
Brady Statistic AP VS Percent: 0.9 %
Brady Statistic AS VP Percent: 48.91 %
Brady Statistic AS VS Percent: 1.64 %
Date Time Interrogation Session: 20161213121237
HIGH POWER IMPEDANCE MEASURED VALUE: 75 Ohm
Implantable Lead Implant Date: 20150121
Implantable Lead Implant Date: 20150121
Implantable Lead Location: 753858
Implantable Lead Location: 753859
Implantable Lead Location: 753860
Lead Channel Impedance Value: 342 Ohm
Lead Channel Impedance Value: 380 Ohm
Lead Channel Impedance Value: 399 Ohm
Lead Channel Impedance Value: 456 Ohm
Lead Channel Impedance Value: 494 Ohm
Lead Channel Impedance Value: 589 Ohm
Lead Channel Impedance Value: 589 Ohm
Lead Channel Pacing Threshold Amplitude: 0.5 V
Lead Channel Pacing Threshold Amplitude: 1 V
Lead Channel Pacing Threshold Pulse Width: 0.4 ms
Lead Channel Setting Pacing Amplitude: 1.5 V
Lead Channel Setting Pacing Pulse Width: 0.4 ms
Lead Channel Setting Pacing Pulse Width: 0.7 ms
Lead Channel Setting Sensing Sensitivity: 0.3 mV
MDC IDC LEAD IMPLANT DT: 20150121
MDC IDC LEAD MODEL: 4298
MDC IDC MSMT BATTERY REMAINING LONGEVITY: 48 mo
MDC IDC MSMT BATTERY VOLTAGE: 2.96 V
MDC IDC MSMT LEADCHNL LV IMPEDANCE VALUE: 342 Ohm
MDC IDC MSMT LEADCHNL LV IMPEDANCE VALUE: 589 Ohm
MDC IDC MSMT LEADCHNL LV IMPEDANCE VALUE: 589 Ohm
MDC IDC MSMT LEADCHNL LV IMPEDANCE VALUE: 627 Ohm
MDC IDC MSMT LEADCHNL LV PACING THRESHOLD PULSEWIDTH: 0.7 ms
MDC IDC MSMT LEADCHNL RA PACING THRESHOLD AMPLITUDE: 0.75 V
MDC IDC MSMT LEADCHNL RA SENSING INTR AMPL: 4 mV
MDC IDC MSMT LEADCHNL RV IMPEDANCE VALUE: 646 Ohm
MDC IDC MSMT LEADCHNL RV IMPEDANCE VALUE: 703 Ohm
MDC IDC MSMT LEADCHNL RV PACING THRESHOLD PULSEWIDTH: 0.4 ms
MDC IDC MSMT LEADCHNL RV SENSING INTR AMPL: 13 mV
MDC IDC SET LEADCHNL LV PACING AMPLITUDE: 2.25 V
MDC IDC SET LEADCHNL RV PACING AMPLITUDE: 2 V
MDC IDC STAT BRADY RA PERCENT PACED: 49.46 %
MDC IDC STAT BRADY RV PERCENT PACED: 92.89 %

## 2015-01-15 NOTE — Progress Notes (Signed)
Heather Smith reported for exercise this am. She complained off not feeling well and had not since Saturday. She states that she has felt weak, slightly nauseated and had some dizziness especially with exertion. She checked  her oxygen saturation was 98%, BP was 100/60 and she reported that her heart rate was 47. I placed pt on the heart monitor. She was pacing with PVC's HR was mostly in the 60's to 70's. Pt reports that she has not been eating or drinking well the past few days. Heather Smith's lung were clear, no pedal edema and her weight is stable. I gave pt some gatorade  to drink. I contacted the [facility] and Dr. Olin Pia office. Patient was discharged and was going to the [facility] to has her ICD interrogated . Her BP at discharge was 102/56 HR 74 Pacing with PVC's. She was not in distress at discharge and denied any dizziness. I pushed her in wheelchair to the [facility] tower and she was to report directly to the [facility]. I instructed pt that if she felt any dizziness to call 911 immediately.

## 2015-01-15 NOTE — Telephone Encounter (Signed)
Pt made aware that I reviewed her interrogation with Dr. Caryl Comes. He will review and discuss with Dr. Aundra Dubin about potential medication changes. Pt verbalizes understanding.

## 2015-01-15 NOTE — Telephone Encounter (Signed)
Pulmonary rehab calling to request if pt can have device interrogated in office. Pt added on at 11:30-- made aware that there was no EP physician in the office until 1:30.

## 2015-01-15 NOTE — Telephone Encounter (Signed)
Heather Smith with cardiac and pulmonary rehab called to advise that CHF patient not feeling well, feeling she will faint with any exertion, and HR irregular.  Heather Smith reports runnign cardiac strips and noting frequent PVCs with HR ranging 40-60's.  BP 100/60.  Unable to fit patient into CHF clinic today as provider only has half day clinic, however, sounds to be more of an ICD issue.  Looks like patient was last seen by Heather Smith in May of this year for f/u and interrogation for ICD.  Advised to see if they could see patient today for this issue.  Fluid status/weight stable.  Renee Pain

## 2015-01-15 NOTE — Telephone Encounter (Signed)
New message   Pulmonary calling - patient there now .    C/O not feeling well, lightheaded, weak feeling,  Hr rate irregular.  Nurse saying she having some PVC. Need to be seen today

## 2015-01-15 NOTE — Progress Notes (Signed)
CRT-D device check in office per pt not feeling well at pulmonary rehab. Pt not visibly in distress. Denies SOB, syncope and chest pain. Thresholds and sensing consistent with previous device measurements. Lead impedance trends stable over time. No mode switch episodes recorded. No ventricular arrhythmia episodes recorded. Patient bi-ventricularly pacing 92.9%, VSR 2.5% of the time. 2,574 V sensing episodes, 6.61min per day.  207 PVCs/hr, 2 PVC runs/hr over the last 7 months (increased from 45 PVCs/hr). Device programmed with appropriate safety margins. Heart failure diagnostics reviewed and trends are stable for patient. No changes made this session. Will review findings with Dr. Caryl Comes and call patient this afternoon with any recommendations. Estimated longevity 4 years.  Patient enrolled in remote follow up. [facility] 03/06/15, ROV with [facility] in May.

## 2015-01-15 NOTE — Telephone Encounter (Signed)
Spoke with Heather Smith in device clinic who advised patient had normal transmission on 11/2.  I spoke with EP department and was advised that there are no appointments available currently.  Truitt Merle, NP who is FLEX APP today advised patient should go to ED at [facility] for evaluation.  I called and spoke with Lattie Haw at Pulmonary rehab who states patient presented this morning not feeling well, light-headed, dizziness.  She did not exercise today.  Lattie Haw states she put her on the monitor and heart rate was 40-60 bpm.  She states it appears patient is having occasional PVC.  I spoke with patient who states she has been experiencing light-headedness and dizziness since Saturday.  She states she is very fatigued.  I advised her to go to the ED for evaluation.  She verbalized understanding and agreement.

## 2015-01-16 ENCOUNTER — Other Ambulatory Visit: Payer: Self-pay

## 2015-01-16 MED ORDER — ZOLPIDEM TARTRATE 5 MG PO TABS: 5.0000 mg | ORAL_TABLET | Freq: Every evening | ORAL | Status: DC | PRN

## 2015-01-17 ENCOUNTER — Encounter (HOSPITAL_COMMUNITY)
Admission: RE | Admit: 2015-01-17 | Discharge: 2015-01-17 | Disposition: A | Discharge status: Home / Self Care | Payer: Self-pay | Source: Ambulatory Visit | Attending: Cardiology | Admitting: Cardiology

## 2015-01-22 ENCOUNTER — Encounter (HOSPITAL_COMMUNITY)
Admission: RE | Admit: 2015-01-22 | Discharge: 2015-01-22 | Disposition: A | Discharge status: Home / Self Care | Payer: Self-pay | Source: Ambulatory Visit | Attending: Cardiology | Admitting: Cardiology

## 2015-01-24 ENCOUNTER — Encounter (HOSPITAL_COMMUNITY)
Admission: RE | Admit: 2015-01-24 | Discharge: 2015-01-24 | Disposition: A | Discharge status: Home / Self Care | Payer: Self-pay | Source: Ambulatory Visit | Attending: Cardiology | Admitting: Cardiology

## 2015-01-29 ENCOUNTER — Encounter (HOSPITAL_COMMUNITY)
Admission: RE | Admit: 2015-01-29 | Discharge: 2015-01-29 | Disposition: A | Discharge status: Home / Self Care | Payer: Self-pay | Source: Ambulatory Visit | Attending: Cardiology | Admitting: Cardiology

## 2015-01-30 ENCOUNTER — Encounter (HOSPITAL_COMMUNITY): Payer: Self-pay

## 2015-01-30 ENCOUNTER — Ambulatory Visit (HOSPITAL_COMMUNITY)
Admission: RE | Admit: 2015-01-30 | Discharge: 2015-01-30 | Disposition: A | Discharge status: Home / Self Care | Payer: Medicare HMO | Source: Ambulatory Visit | Attending: Cardiology | Admitting: Cardiology

## 2015-01-30 VITALS — BP 106/64 | HR 60 | Wt 185.5 lb

## 2015-01-30 DIAGNOSIS — F329 Major depressive disorder, single episode, unspecified: Secondary | ICD-10-CM | POA: Insufficient documentation

## 2015-01-30 DIAGNOSIS — I493 Ventricular premature depolarization: Secondary | ICD-10-CM | POA: Insufficient documentation

## 2015-01-30 DIAGNOSIS — R69 Illness, unspecified: Secondary | ICD-10-CM | POA: Diagnosis not present

## 2015-01-30 DIAGNOSIS — I5022 Chronic systolic (congestive) heart failure: Secondary | ICD-10-CM | POA: Insufficient documentation

## 2015-01-30 LAB — BASIC METABOLIC PANEL
Anion gap: 9 (ref 5–15)
BUN: 24 mg/dL — AB (ref 6–20)
CHLORIDE: 106 mmol/L (ref 101–111)
CO2: 25 mmol/L (ref 22–32)
CREATININE: 1.16 mg/dL — AB (ref 0.44–1.00)
Calcium: 9.7 mg/dL (ref 8.9–10.3)
GFR calc Af Amer: 50 mL/min — ABNORMAL LOW (ref >60)
GFR calc non Af Amer: 43 mL/min — ABNORMAL LOW (ref >60)
Glucose, Bld: 100 mg/dL — ABNORMAL HIGH (ref 65–99)
Potassium: 4.6 mmol/L (ref 3.5–5.1)
SODIUM: 140 mmol/L (ref 135–145)

## 2015-01-30 LAB — MAGNESIUM: Magnesium: 1.9 mg/dL (ref 1.7–2.4)

## 2015-01-30 LAB — BRAIN NATRIURETIC PEPTIDE: B Natriuretic Peptide: 21.6 pg/mL (ref 0.0–100.0)

## 2015-01-30 NOTE — Patient Instructions (Signed)
Routine lab work today. Will notify you of abnormal results, otherwise no news is good news!  Will schedule you for an echocardiogram at [facility]. Address: [address] ([address] Floor), [place], [place] [address]  Phone: [phone]  Will schedule you for follow up with Dr. Caryl Comes to evaluate device function.  Follow up with our office in 1 month.  Do the following things EVERYDAY: 1) Weigh yourself in the morning before breakfast. Write it down and keep it in a log. 2) Take your medicines as prescribed 3) Eat low salt foods-Limit salt (sodium) to 2000 mg per day.  4) Stay as active as you can everyday 5) Limit all fluids for the day to less than 2 liters

## 2015-01-30 NOTE — Progress Notes (Signed)
Patient ID: Heather Smith, female   DOB: 03-05-1932, 81 y.o.   MRN: [id number] PCP: Dr. Kenton Kingfisher HF Cardiology: Aundra Dubin  81 yo with history of nonischemic cardiomyopathy and chronic systolic HF.  Patient has felt like her energy level has been low for several years.  In 9/14, she was admitted to [facility] with chest pain.  Cardiac cath was done, showing no significant coronary disease.  However, echo showed EF 30-35%.  She was started on meds for CHF, and echo was repeated in 12/14, EF was read as 20-25%.  She had a Medtonic CRT-D device placed (had baseline LBBB).  In 3/15, the LV lead was not capturing properly so device had to be reprogrammed.  She had AV optimization in 5/15.  Limited echo at that time showed improvement in EF to 40-45%.  CPX in 5/15 was submaximal but suggested low normal to mildly reduced functional capacity. Last echo in 6/16 showed EF 35-40% with diffuse hypokinesis.  CPX was done in 9/16.  This actually showed good functional capacity.   Heather Smith seems to be doing worse symptomatically than when I last saw her.  She has a lot of fatigue.  She is short of breath after walking about 100 yards.  She feels like she did not improve symptomatically with CRT.  Earlier this month, she was having "weak spells."  Frequent PVCs noted at rehab, so device interrogation done showing 207 PVCs/hr, which was considerably more than in the past.  Currently not having any more "weak spells" and does not get palpitations.  No orthopnea/PND.  No chest pain.  BP ok currently but noted to run in the 0000000 systolic at times at pulmonary rehab.   Optivol: Fluid index < threshold, stable thoracic impedance  Labs (9/14): TSH normal Labs (3/15): K 4.7, creatinine 1.1 Labs (5/15): K 4.9, creatinine 1.1, BNP 44 Labs (6/15): K 5.1, creatinine 1.4, pro-BNP 39 Labs (6/15): K 5.3, creatinine 1.53 Labs (12/15): K 4.4, creatinine 1.03 Labs (5/16): K 4, creatinine 1.0, BNP 34.6 Labs (6/16): K 4.3, creatinine  1.1 Labs (9/16): K 4.4, creatinine 1.16 Labs (11/16): K 4.5, creatinine 1.16  PMH: 1. Chronic systolic CHF: Nonischemic cardiomyopathy.  LHC (9/14) with no significant coronary disease, EF 25-30% with global hypokinesis.  Echo (9/14) with EF 30-35%, moderate LV dilation, diffuse hypokinesis.  Echo (12/14) with EF 20-25%.  She does not drink ETOH.  She had Medtronic CRT-D device implantation in 1/15. Echo (5/15) with EF 40-45%, mild LVH.  CPX (5/15) was submaximal with RER 0.99, peak VO2 14, VE/VCO2 37.3; low normal functional capacity may be limited by loss of BiV pacing at higher HR and by deconditioning but interpretation somewhat limited since study was submaximal.  Echo (6/16) with EF 35-40%, severe LV dilation, diffuse hypokinesis, mild MR.  CPX (9/16) with peak VO2 17.7, VE/VCO2 slope 32.3, RER 1.11 => excellent functional capacity.  2. Back pain s/p lumbar laminectomy 3. OA: Knees.  4. Hyperlipidemia 5. Depression 6. PVCs  SH: Lives [place], married, never smoked, no ETOH.    FH: Mother with MI at 28, possible ischemic cardiomyopathy.  Father with cardiomyopathy, died at 56.  She has 5 brothers, none with significant coronary disease.    ROS: All systems reviewed and negative except as per HPI.   Current Outpatient Prescriptions  Medication Sig Dispense Refill  . Ascorbic Acid (VITAMIN C) 1000 MG tablet Take 1,000 mg by mouth daily.     . Biotin (BIOTIN 5000) 5 MG CAPS  Take 5 mg by mouth daily.     . carvedilol (COREG) 6.25 MG tablet Take 1.5 tablets (9.375 mg total) by mouth 2 (two) times daily. 180 tablet 3  . cholecalciferol (VITAMIN D) 1000 UNITS tablet Take 1,000 Units by mouth daily.      . citalopram (CELEXA) 20 MG tablet Take 1 tablet by mouth daily.    . Coenzyme Q10 (COQ10) 100 MG CAPS Take 1 capsule by mouth daily.    . furosemide (LASIX) 20 MG tablet TAKE ONE TABLET BY MOUTH ONCE DAILY ON MONDAY, WEDNESDAY AND FRIDAY 12 tablet 3  . levothyroxine (SYNTHROID,  LEVOTHROID) 50 MCG tablet Take 50 mcg by mouth daily.      . Magnesium Oxide (MAG-200 PO) Take 1 tablet by mouth 2 (two) times daily.     . multivitamin (THERAGRAN) per tablet Take 1 tablet by mouth daily.      . pantoprazole (PROTONIX) 40 MG tablet Take 40 mg by mouth daily.    . sacubitril-valsartan (ENTRESTO) 49-51 MG Take 1 tablet by mouth 2 (two) times daily. 60 tablet 3  . spironolactone (ALDACTONE) 25 MG tablet Take 1 tablet (25 mg total) by mouth daily. 90 tablet 3  . traMADol (ULTRAM) 50 MG tablet Take 1 tablet by mouth every 12 (twelve) hours as needed for moderate pain.     . Turmeric 500 MG CAPS Take 1 capsule by mouth daily.    . vitamin E 400 UNIT capsule Take 400 Units by mouth daily.    . B Complex Vitamins (VITAMIN B COMPLEX PO) Take 1 tablet by mouth daily. Reported on 01/30/2015    . zolpidem (AMBIEN) 5 MG tablet Take 1 tablet (5 mg total) by mouth at bedtime as needed. for sleep 15 tablet 0   No current facility-administered medications for this encounter.   Filed Vitals:   01/30/15 1341  BP: 106/64  Pulse: 60  Weight: 185 lb 8 oz (84.142 kg)  SpO2: 98%    General: NAD, husband present Neck: JVP 7, no thyromegaly or thyroid nodule.  Lungs: Clear to auscultation bilaterally with normal respiratory effort. CV: Nondisplaced PMI.  Heart regular S1/S2, no S3/S4, 1/6 HSM at apex.  No peripheral edema.  No carotid bruit.  Normal pedal pulses.  Abdomen: Soft, nontender, no hepatosplenomegaly, no distention.  Skin: Intact without lesions or rashes.  Neurologic: Alert and oriented x 3.  Psych: Normal affect. Extremities: No clubbing or cyanosis.   Assessment/Plan:  1. Chronic systolic CHF: Nonischemic cardiomyopathy s/p Medtronic CRT-D. EF 35-40% (6/16). She does have a family history of cardiomyopathy of uncertain etiology (father) but none of her siblings developed a cardiomyopathy.  Familial cardiomyopathy is a consideration.  Viral myocarditis is also a possible  etiology.  She is not volume overloaded on exam or by Optivol but still has NYHA class II-III symptoms with both dyspnea and fatigue.  Symptoms have been out of proportion to objective findings.  Interestingly, her recent CPX showed excellent functional capacity. I have thought that some of her fatigue and symptomatology may be due to deconditioning and depression, but pulmonary rehab has not seemed to help much.  She was noted earlier this month to have more PVCs, and I wonder if these could be contributing.  - Continue pulmonary rehab.  - Continue current Lasix regimen.  - Continue spironolactone 25 mg daily.  - Continue Coreg 9.375 mg bid.  - Continue Entresto 49/51 bid.  - I will not titrate up any of her cardiac meds  as her SBP is down into the 90s at times.  - I will see if Dr Caryl Comes can re-evaluate her CRT device to see if any optimization is possible.  - BMET/BNP today.  - Echo to see if EF is maintaining its improvement.  - I will see her back in a month.  If she does not feel any better, would consider RHC to assess cardiac output, consider digoxin use.  2. Depression: Remains depressed.  Continue Celexa.  3. PVCs: More frequent on recent interrogation.  I wonder if these could be contributing to her symptoms.  Do not think we have BP room to go up on Coreg.  I will get an echo to see if EF is maintaining its improvement or if it has decreased again, perhaps in setting of frequent PVCs.   Loralie Champagne 01/30/2015

## 2015-01-31 ENCOUNTER — Encounter (HOSPITAL_COMMUNITY)
Admission: RE | Admit: 2015-01-31 | Discharge: 2015-01-31 | Disposition: A | Discharge status: Home / Self Care | Payer: Self-pay | Source: Ambulatory Visit | Attending: Cardiology | Admitting: Cardiology

## 2015-02-04 ENCOUNTER — Other Ambulatory Visit (HOSPITAL_COMMUNITY): Payer: Self-pay | Admitting: Cardiology

## 2015-02-05 ENCOUNTER — Encounter (HOSPITAL_COMMUNITY)
Admission: RE | Admit: 2015-02-05 | Discharge: 2015-02-05 | Disposition: A | Discharge status: Home / Self Care | Payer: Self-pay | Source: Ambulatory Visit | Attending: Cardiology | Admitting: Cardiology

## 2015-02-05 DIAGNOSIS — I509 Heart failure, unspecified: Secondary | ICD-10-CM | POA: Insufficient documentation

## 2015-02-07 ENCOUNTER — Encounter (HOSPITAL_COMMUNITY)
Admission: RE | Admit: 2015-02-07 | Discharge: 2015-02-07 | Disposition: A | Discharge status: Home / Self Care | Payer: Self-pay | Source: Ambulatory Visit | Attending: Cardiology | Admitting: Cardiology

## 2015-02-12 ENCOUNTER — Encounter (HOSPITAL_COMMUNITY): Payer: Self-pay

## 2015-02-13 ENCOUNTER — Other Ambulatory Visit: Payer: Self-pay

## 2015-02-13 ENCOUNTER — Ambulatory Visit (HOSPITAL_COMMUNITY): Payer: Medicare HMO | Attending: Cardiovascular Disease

## 2015-02-13 DIAGNOSIS — I10 Essential (primary) hypertension: Secondary | ICD-10-CM | POA: Diagnosis not present

## 2015-02-13 DIAGNOSIS — I5022 Chronic systolic (congestive) heart failure: Secondary | ICD-10-CM | POA: Insufficient documentation

## 2015-02-13 DIAGNOSIS — E785 Hyperlipidemia, unspecified: Secondary | ICD-10-CM | POA: Diagnosis not present

## 2015-02-14 ENCOUNTER — Encounter (HOSPITAL_COMMUNITY)
Admission: RE | Admit: 2015-02-14 | Discharge: 2015-02-14 | Disposition: A | Discharge status: Home / Self Care | Payer: Self-pay | Source: Ambulatory Visit | Attending: Cardiology | Admitting: Cardiology

## 2015-02-18 ENCOUNTER — Ambulatory Visit (INDEPENDENT_AMBULATORY_CARE_PROVIDER_SITE_OTHER): Payer: Medicare HMO | Admitting: Internal Medicine

## 2015-02-18 ENCOUNTER — Encounter: Payer: Self-pay | Admitting: Internal Medicine

## 2015-02-18 VITALS — BP 102/66 | HR 60 | Ht 67.5 in | Wt 189.6 lb

## 2015-02-18 DIAGNOSIS — I429 Cardiomyopathy, unspecified: Secondary | ICD-10-CM

## 2015-02-18 DIAGNOSIS — I480 Paroxysmal atrial fibrillation: Secondary | ICD-10-CM | POA: Diagnosis not present

## 2015-02-18 DIAGNOSIS — I5022 Chronic systolic (congestive) heart failure: Secondary | ICD-10-CM | POA: Diagnosis not present

## 2015-02-18 DIAGNOSIS — I428 Other cardiomyopathies: Secondary | ICD-10-CM

## 2015-02-18 NOTE — Progress Notes (Signed)
/      Patient Care Team: Shirline Frees, MD as PCP - General   HPI  Heather Smith is a 82 y.o. female Seen in followup for CRT implantation for nonischemic cardiomyopathy and depressed LV function. This was accomplished fall 2014  Interval partial lead dislodgment noted after she saw Dr. Mariana Single in March and an x-ray was obtained and reviewed. This  x-ray was reviewed. We reprogrammed the device   He continues to complain of shortness of breath at 100 feet. She does not have nocturnal dyspnea orthopnea or peripheral edema.  I reviewed with her the data from her CPX 9/16 showed excellent functional capacity and her echo from last week demonstrated normal LV function  Echo 1/17   Normalization of LV function with only mild diastolic dysfunction  Interval echo has shown EF>>40%  Past Medical History  Diagnosis Date  . Hypertension   . Hypercholesterolemia   . Left bundle branch block   . Hypothyroidism   . Spinal stenosis   . GERD (gastroesophageal reflux disease)   . Vitamin D deficiency   . Neurogenic claudication due to lumbar spinal stenosis   . Chest pain     a. Nl cath in the 90's;  b. 04/2005: Myoview: subtle areao of ? reversibility in apical segment of anteromedial LV - ? attenuation, EF 54%;  c. 05/2012 MV: small, fixed mild apical septal perfusion defect, ? attenuation due to lbbb, no ischemia.  . Non-ischemic cardiomyopathy ([place])     [place] 10/04/12: Normal coronary arteries, EF 25-30% with global HK.  Echocardiogram 10/05/12: EF 30-35%, diffuse HK, moderate LAE. Echocardiogram (01/2013): Lateral to septal wall dyssynchrony EF 20-25%, diffuse HK  . Chronic systolic heart failure ([place])   . Automatic implantable cardioverter-defibrillator in situ   . CHF (congestive heart failure) ([place])   . Osteoarthritis     "about all over" (02/22/2013)  . LV lead partial dislodgment 05/30/2013  . Biventricular defibrillator  Medtronic 05/30/2013    Past Surgical History  Procedure  Laterality Date  . Tubal ligation    . Foot arthrodesis, modified mcbride Bilateral   . Knee arthroscopy Left   . Knee arthroscopy Right   . Cardiac catheterization  2000  . Lumbar laminectomy  11/03/2006     Bilateral 3, 4, 5 laminectomy, partial L2, decompression of the thecal sac, foraminotomy, posterolateral arthrodesis L3-S1 with autograft   -- SURGEON:  Leeroy Cha, M.D.   . Cardiac defibrillator placement  02/22/2013    with LV lead placement and HV lead testing/notes 02/22/2013  . Appendectomy    . Excisional hemorrhoidectomy    . Tonsillectomy    . Blepharoplasty Bilateral   . Vaginal hysterectomy    . Left heart catheterization with coronary angiogram N/A 10/04/2012    Procedure: LEFT HEART CATHETERIZATION WITH CORONARY ANGIOGRAM;  Surgeon: Peter M Martinique, MD;  [facility] C[facility] LAB;  Service: Cardiovascular;  Laterality: N/A;  . Bi-ventricular implantable cardioverter defibrillator N/A 02/22/2013    Procedure: BI-VENTRICULAR IMPLANTABLE CARDIOVERTER DEFIBRILLATOR  (CRT-D);  Surgeon: Deboraha Sprang, MD;  Location: [facility] CATH LAB;  Service: Cardiovascular;  Laterality: N/A;    Current Outpatient Prescriptions  Medication Sig Dispense Refill  . Ascorbic Acid (VITAMIN C) 1000 MG tablet Take 1,000 mg by mouth daily.     . B Complex Vitamins (VITAMIN B COMPLEX PO) Take 1 tablet by mouth daily. Reported on 01/30/2015    . Biotin (BIOTIN 5000) 5 MG CAPS Take 5 mg by mouth daily.     Marland Kitchen  carvedilol (COREG) 6.25 MG tablet Take 1.5 tablets (9.375 mg total) by mouth 2 (two) times daily. 180 tablet 3  . cholecalciferol (VITAMIN D) 1000 UNITS tablet Take 1,000 Units by mouth daily.      . citalopram (CELEXA) 20 MG tablet Take 20 mg by mouth daily.    . Coenzyme Q10 (COQ10) 100 MG CAPS Take 1 capsule by mouth daily.    Marland Kitchen ENTRESTO 49-51 MG TAKE ONE TABLET BY MOUTH TWICE DAILY 60 tablet 0  . furosemide (LASIX) 20 MG tablet TAKE ONE TABLET BY MOUTH ONCE DAILY ON MONDAY, WEDNESDAY AND FRIDAY 12  tablet 3  . levothyroxine (SYNTHROID, LEVOTHROID) 50 MCG tablet Take 50 mcg by mouth daily.      . Magnesium Oxide (MAG-200 PO) Take 1 tablet by mouth 2 (two) times daily.     . multivitamin (THERAGRAN) per tablet Take 1 tablet by mouth daily.      . pantoprazole (PROTONIX) 40 MG tablet Take 40 mg by mouth daily.    Marland Kitchen spironolactone (ALDACTONE) 25 MG tablet Take 1 tablet (25 mg total) by mouth daily. 90 tablet 3  . traMADol (ULTRAM) 50 MG tablet Take 1 tablet by mouth every 12 (twelve) hours as needed for moderate pain.     . vitamin E 400 UNIT capsule Take 400 Units by mouth daily.     No current facility-administered medications for this visit.    Allergies  Allergen Reactions  . Niaspan [Niacin Er]     High doses, causes hot flashes & aching   . Statins Other (See Comments)    "Makes her bones hurt and very sore"    Review of Systems negative except from HPI and PMH  Physical Exam BP 102/66 mmHg  Pulse 60  Ht 5' 7.5" (1.715 m)  Wt 189 lb 9.6 oz (86.002 kg)  BMI 29.24 kg/m2 Well developed and well nourished in no acute distress HENT normal E scleral and icterus clear Neck Supple JVP flat; carotids brisk and full Clear to ausculation  *Regular rate and rhythm, no murmurs gallops or rub Soft with active bowel sounds No clubbing cyanosis Edema Alert and oriented, grossly normal motor and sensory function Skin Warm and Dry  ECG demonstrates  AV pacing with  isoelectric QRS in lead V1 Intervals 13/14/46  Assessment and  Plan  Nonischemic cardiomyopathy interval normalization  Congestive heart failure-chronic-diastolic Euvolemic continue current meds  Medtronic ICD-CRT The patient's device was interrogated.  The information was reviewed. No changes were made in the programming.     Atrial fibrillation-paroxysmal  LV threshold change  Depression   PVCs  Hypotension    There has been interval normalization of LV function and CPX was remarkably good;  I don't  well understand the discrepancy between her complaints and the CPX findings  The role of PVCs --207/hr is a relatively scant few, and it is hard to blame exercise intolerance on that.  Thus in the absence of her having symptoms and with interval normalization of LV function and no specific therapy is indicated.  I have been in touch with Dr. Aundra Dubin regarding decreasing of her medications given her low blood pressures 88-105 particularly in the context of normalization of LV function.

## 2015-02-18 NOTE — Patient Instructions (Signed)
Medication Instructions: - no changes  Labwork: - none  Procedures/Testing: - nond  Follow-Up: - Your physician wants you to follow-up in: 1 year with Dr. Caryl Comes. You will receive a reminder letter in the mail two months in advance. If you don't receive a letter, please call our office to schedule the follow-up appointment.   Any Additional Special Instructions Will Be Listed Below (If Applicable).

## 2015-02-19 ENCOUNTER — Encounter (HOSPITAL_COMMUNITY)
Admission: RE | Admit: 2015-02-19 | Discharge: 2015-02-19 | Disposition: A | Discharge status: Home / Self Care | Payer: Self-pay | Source: Ambulatory Visit | Attending: Cardiology | Admitting: Cardiology

## 2015-02-21 ENCOUNTER — Encounter (HOSPITAL_BASED_OUTPATIENT_CLINIC_OR_DEPARTMENT_OTHER): Payer: Medicare HMO

## 2015-02-21 ENCOUNTER — Encounter (HOSPITAL_COMMUNITY)
Admission: RE | Admit: 2015-02-21 | Discharge: 2015-02-21 | Disposition: A | Discharge status: Home / Self Care | Payer: Self-pay | Source: Ambulatory Visit | Attending: Cardiology | Admitting: Cardiology

## 2015-02-21 ENCOUNTER — Telehealth: Payer: Self-pay | Admitting: Internal Medicine

## 2015-02-21 NOTE — Telephone Encounter (Signed)
Message  Received: 3 days ago    Deboraha Sprang, MD  Emily Filbert, RN           DM got back Can we decrease entreston>>24/26 bid  thx      I called the patient's home # and left a message to call regarding med changes and to please call for recommendations.  I called the patient's cell # and did speak with her. She is aware to decrease Entresto to 24/26 mg BID. She states she does have this dose at home and will start taking the lower dose. She is scheduled to follow up with Dr. Aundra Dubin on 03/04/15.

## 2015-02-26 ENCOUNTER — Encounter (HOSPITAL_COMMUNITY)
Admission: RE | Admit: 2015-02-26 | Discharge: 2015-02-26 | Disposition: A | Discharge status: Home / Self Care | Payer: Self-pay | Source: Ambulatory Visit | Attending: Cardiology | Admitting: Cardiology

## 2015-02-28 ENCOUNTER — Encounter (HOSPITAL_COMMUNITY)
Admission: RE | Admit: 2015-02-28 | Discharge: 2015-02-28 | Disposition: A | Discharge status: Home / Self Care | Payer: Self-pay | Source: Ambulatory Visit | Attending: Cardiology | Admitting: Cardiology

## 2015-03-04 ENCOUNTER — Encounter (HOSPITAL_COMMUNITY): Payer: Self-pay

## 2015-03-04 ENCOUNTER — Ambulatory Visit (HOSPITAL_COMMUNITY)
Admission: RE | Admit: 2015-03-04 | Discharge: 2015-03-04 | Disposition: A | Discharge status: Home / Self Care | Payer: Medicare HMO | Source: Ambulatory Visit | Attending: Cardiology | Admitting: Cardiology

## 2015-03-04 VITALS — BP 112/68 | HR 75 | Wt 184.5 lb

## 2015-03-04 DIAGNOSIS — E785 Hyperlipidemia, unspecified: Secondary | ICD-10-CM | POA: Insufficient documentation

## 2015-03-04 DIAGNOSIS — Z79899 Other long term (current) drug therapy: Secondary | ICD-10-CM | POA: Insufficient documentation

## 2015-03-04 DIAGNOSIS — M179 Osteoarthritis of knee, unspecified: Secondary | ICD-10-CM | POA: Insufficient documentation

## 2015-03-04 DIAGNOSIS — R109 Unspecified abdominal pain: Secondary | ICD-10-CM | POA: Diagnosis not present

## 2015-03-04 DIAGNOSIS — I428 Other cardiomyopathies: Secondary | ICD-10-CM

## 2015-03-04 DIAGNOSIS — Z95810 Presence of automatic (implantable) cardiac defibrillator: Secondary | ICD-10-CM | POA: Insufficient documentation

## 2015-03-04 DIAGNOSIS — R5383 Other fatigue: Secondary | ICD-10-CM | POA: Diagnosis not present

## 2015-03-04 DIAGNOSIS — R69 Illness, unspecified: Secondary | ICD-10-CM | POA: Diagnosis not present

## 2015-03-04 DIAGNOSIS — R1013 Epigastric pain: Secondary | ICD-10-CM | POA: Diagnosis not present

## 2015-03-04 DIAGNOSIS — I5022 Chronic systolic (congestive) heart failure: Secondary | ICD-10-CM

## 2015-03-04 DIAGNOSIS — I429 Cardiomyopathy, unspecified: Secondary | ICD-10-CM

## 2015-03-04 DIAGNOSIS — Z8249 Family history of ischemic heart disease and other diseases of the circulatory system: Secondary | ICD-10-CM | POA: Insufficient documentation

## 2015-03-04 DIAGNOSIS — I493 Ventricular premature depolarization: Secondary | ICD-10-CM

## 2015-03-04 DIAGNOSIS — F329 Major depressive disorder, single episode, unspecified: Secondary | ICD-10-CM | POA: Diagnosis not present

## 2015-03-04 LAB — BASIC METABOLIC PANEL
Anion gap: 10 (ref 5–15)
BUN: 26 mg/dL — AB (ref 6–20)
CHLORIDE: 105 mmol/L (ref 101–111)
CO2: 28 mmol/L (ref 22–32)
CREATININE: 1.34 mg/dL — AB (ref 0.44–1.00)
Calcium: 9.8 mg/dL (ref 8.9–10.3)
GFR calc Af Amer: 42 mL/min — ABNORMAL LOW (ref >60)
GFR calc non Af Amer: 36 mL/min — ABNORMAL LOW (ref >60)
Glucose, Bld: 131 mg/dL — ABNORMAL HIGH (ref 65–99)
Potassium: 4.3 mmol/L (ref 3.5–5.1)
SODIUM: 143 mmol/L (ref 135–145)

## 2015-03-04 LAB — CBC
HCT: 42.4 % (ref 36.0–46.0)
HEMOGLOBIN: 14.5 g/dL (ref 12.0–15.0)
MCH: 30.8 pg (ref 26.0–34.0)
MCHC: 34.2 g/dL (ref 30.0–36.0)
MCV: 90 fL (ref 78.0–100.0)
Platelets: 214 10*3/uL (ref 150–400)
RBC: 4.71 MIL/uL (ref 3.87–5.11)
RDW: 13.4 % (ref 11.5–15.5)
WBC: 6.5 10*3/uL (ref 4.0–10.5)

## 2015-03-04 LAB — SEDIMENTATION RATE: Sed Rate: 13 mm/hr (ref 0–22)

## 2015-03-04 LAB — TSH: TSH: 0.898 u[IU]/mL (ref 0.350–4.500)

## 2015-03-04 MED ORDER — CARVEDILOL 6.25 MG PO TABS: 6.2500 mg | ORAL_TABLET | Freq: Two times a day (BID) | ORAL | Status: DC

## 2015-03-04 MED ORDER — ZOLPIDEM TARTRATE ER 6.25 MG PO TBCR
6.2500 mg | EXTENDED_RELEASE_TABLET | Freq: Every evening | ORAL | Status: DC | PRN
Start: 2015-03-04 — End: 2016-01-23

## 2015-03-04 NOTE — Patient Instructions (Addendum)
DECREASE Coreg to 6.25 mg, one tab twice a day CHANGE Entresto to 24/26 mg ,one tab twice a day  A refill of Ambien has been written today, further refills should come from your PCP Labs today  Non-Cardiac CT scanning, (CAT scanning), is a noninvasive, special x-ray that produces cross-sectional images of the body using x-rays and a computer. CT scans help physicians diagnose and treat medical conditions. For some CT exams, a contrast material is used to enhance visibility in the area of the body being studied. CT scans provide greater clarity and reveal more details than regular x-ray exams.  Your physician recommends that you schedule a follow-up appointment in: 4 months

## 2015-03-05 ENCOUNTER — Telehealth: Payer: Self-pay

## 2015-03-05 ENCOUNTER — Encounter (HOSPITAL_COMMUNITY)
Admission: RE | Admit: 2015-03-05 | Discharge: 2015-03-05 | Disposition: A | Discharge status: Home / Self Care | Payer: Self-pay | Source: Ambulatory Visit | Attending: Cardiology | Admitting: Cardiology

## 2015-03-05 ENCOUNTER — Encounter (HOSPITAL_COMMUNITY): Payer: Self-pay | Admitting: *Deleted

## 2015-03-05 DIAGNOSIS — R109 Unspecified abdominal pain: Secondary | ICD-10-CM | POA: Insufficient documentation

## 2015-03-05 NOTE — Progress Notes (Signed)
Patient ID: Heather Smith, female   DOB: 1932-08-28, 82 y.o.   MRN: [id number] PCP: Dr. Kenton Kingfisher HF Cardiology: Aundra Dubin  82 yo with history of nonischemic cardiomyopathy and chronic systolic HF.  Patient has felt like her energy level has been low for several years.  In 9/14, she was admitted to [facility] with chest pain.  Cardiac cath was done, showing no significant coronary disease.  However, echo showed EF 30-35%.  She was started on meds for CHF, and echo was repeated in 12/14, EF was read as 20-25%.  She had a Medtonic CRT-D device placed (had baseline LBBB).  In 3/15, the LV lead was not capturing properly so device had to be reprogrammed.  She had AV optimization in 5/15.  Limited echo at that time showed improvement in EF to 40-45%.  CPX in 5/15 was submaximal but suggested low normal to mildly reduced functional capacity. Last echo in 6/16 showed EF 35-40% with diffuse hypokinesis.  CPX was done in 9/16.  This actually showed good functional capacity. She had repeat echo in 1/17, showing EF increased to 60-65%.    Despite improvement in LV function, she still feels like her energy level is very poor. She does not get particularly short of breath but wears out quickly.  Husband notices and feels like something must be wrong.  BP dose run low at times, with SBP into the 90s occasionally (Usually 100s-110s).  No chest pain.  No lightheadedness.  She does get occasional epigastric abdominal pain after eating. Weight stable.   Optivol: Fluid index < threshold, stable thoracic impedance  Labs (9/14): TSH normal Labs (3/15): K 4.7, creatinine 1.1 Labs (5/15): K 4.9, creatinine 1.1, BNP 44 Labs (6/15): K 5.1, creatinine 1.4, pro-BNP 39 Labs (6/15): K 5.3, creatinine 1.53 Labs (12/15): K 4.4, creatinine 1.03 Labs (5/16): K 4, creatinine 1.0, BNP 34.6 Labs (6/16): K 4.3, creatinine 1.1 Labs (9/16): K 4.4, creatinine 1.16 Labs (11/16): K 4.5, creatinine 1.16 Labs (12/16): K 4.6, creatinine 1.16,  BNP 22, Mg 1.9  PMH: 1. Chronic systolic CHF: Nonischemic cardiomyopathy.  LHC (9/14) with no significant coronary disease, EF 25-30% with global hypokinesis.  Echo (9/14) with EF 30-35%, moderate LV dilation, diffuse hypokinesis.  Echo (12/14) with EF 20-25%.  She does not drink ETOH.  She had Medtronic CRT-D device implantation in 1/15. Echo (5/15) with EF 40-45%, mild LVH.  CPX (5/15) was submaximal with RER 0.99, peak VO2 14, VE/VCO2 37.3; low normal functional capacity may be limited by loss of BiV pacing at higher HR and by deconditioning but interpretation somewhat limited since study was submaximal.  Echo (6/16) with EF 35-40%, severe LV dilation, diffuse hypokinesis, mild MR.  CPX (9/16) with peak VO2 17.7, VE/VCO2 slope 32.3, RER 1.11 => excellent functional capacity. Echo (11/17) with EF 60-65%.  2. Back pain s/p lumbar laminectomy 3. OA: Knees.  4. Hyperlipidemia 5. Depression 6. PVCs  SH: Lives [place], married, never smoked, no ETOH.    FH: Mother with MI at 32, possible ischemic cardiomyopathy.  Father with cardiomyopathy, died at 74.  She has 5 brothers, none with significant coronary disease.    ROS: All systems reviewed and negative except as per HPI.   Current Outpatient Prescriptions  Medication Sig Dispense Refill  . Ascorbic Acid (VITAMIN C) 1000 MG tablet Take 1,000 mg by mouth daily.     . B Complex Vitamins (VITAMIN B COMPLEX PO) Take 1 tablet by mouth daily. Reported on 01/30/2015    .  Biotin (BIOTIN 5000) 5 MG CAPS Take 5 mg by mouth daily.     . carvedilol (COREG) 6.25 MG tablet Take 1 tablet (6.25 mg total) by mouth 2 (two) times daily. 180 tablet 3  . cholecalciferol (VITAMIN D) 1000 UNITS tablet Take 1,000 Units by mouth daily.      . citalopram (CELEXA) 20 MG tablet Take 20 mg by mouth daily.    . furosemide (LASIX) 20 MG tablet TAKE ONE TABLET BY MOUTH ONCE DAILY ON MONDAY, WEDNESDAY AND FRIDAY 12 tablet 3  . levothyroxine (SYNTHROID, LEVOTHROID) 50  MCG tablet Take 50 mcg by mouth daily.      . Magnesium Oxide (MAG-200 PO) Take 1 tablet by mouth 2 (two) times daily.     . multivitamin (THERAGRAN) per tablet Take 1 tablet by mouth daily.      . pantoprazole (PROTONIX) 40 MG tablet Take 40 mg by mouth daily.    . sacubitril-valsartan (ENTRESTO) 24-26 MG Take 1 tablet by mouth 2 (two) times daily.    Marland Kitchen spironolactone (ALDACTONE) 25 MG tablet Take 1 tablet (25 mg total) by mouth daily. 90 tablet 3  . traMADol (ULTRAM) 50 MG tablet Take 1 tablet by mouth every 12 (twelve) hours as needed for moderate pain.     . vitamin E 400 UNIT capsule Take 400 Units by mouth daily.    . Coenzyme Q10 (COQ10) 100 MG CAPS Take 1 capsule by mouth daily.    Marland Kitchen zolpidem (AMBIEN CR) 6.25 MG CR tablet Take 1 tablet (6.25 mg total) by mouth at bedtime as needed for sleep. 30 tablet 0   No current facility-administered medications for this encounter.   Filed Vitals:   03/04/15 1505  BP: 112/68  Pulse: 75  Weight: 184 lb 8 oz (83.689 kg)  SpO2: 97%    General: NAD, husband present Neck: JVP 7, no thyromegaly or thyroid nodule.  Lungs: Clear to auscultation bilaterally with normal respiratory effort. CV: Nondisplaced PMI.  Heart regular S1/S2, no S3/S4, 1/6 HSM at apex.  No peripheral edema.  No carotid bruit.  Normal pedal pulses.  Abdomen: Soft, nontender, no hepatosplenomegaly, no distention.  Skin: Intact without lesions or rashes.  Neurologic: Alert and oriented x 3.  Psych: Normal affect. Extremities: No clubbing or cyanosis.   Assessment/Plan:  1. Chronic systolic CHF: Nonischemic cardiomyopathy s/p Medtronic CRT-D. EF 35-40% (6/16). She does have a family history of cardiomyopathy of uncertain etiology (father) but none of her siblings developed a cardiomyopathy.  Familial cardiomyopathy is a consideration.  Viral myocarditis is also a possible etiology.  Echo in 1/17 showed improvement in EF to 60-65%, but she is still profoundly fatigued. She is not  volume overloaded on exam or by Optivol but still has NYHA class II-III symptoms primarily due to fatigue.  Symptoms have been out of proportion to objective findings.  Her recent CPX also showed excellent functional capacity. I have thought that some of her fatigue and symptomatology may be due to deconditioning and depression, but pulmonary rehab has not seemed to help much.   - Check ESR, TSH, CBC, ANA today.  - Abdomen/pelvis CT given some epigastric pain, rule out abdominal pathology like pancreatic cancer that could contribute to her generalized fatigue. - Needs to see PCP to make sure she has all her age-appropriate cancer screenings done.   - Continue pulmonary rehab.  - Continue current Lasix regimen.  - Continue spironolactone 25 mg daily.  - Decrease Coreg to 6.25 mg bid to  see if this helps symptoms.  - Continue Entresto 24/26 bid.  2. Depression: Remains depressed.  Continue Celexa.  3. PVCs: Not feeling palpitations very often now.   Loralie Champagne 03/05/2015

## 2015-03-05 NOTE — Telephone Encounter (Signed)
Prior auth for Zolpidem ER 6.25mg  sent to [facility].

## 2015-03-05 NOTE — Telephone Encounter (Signed)
Zolpidem Cr approved. Pharmacy notified.

## 2015-03-06 ENCOUNTER — Ambulatory Visit (INDEPENDENT_AMBULATORY_CARE_PROVIDER_SITE_OTHER): Payer: Medicare HMO | Admitting: *Deleted

## 2015-03-06 DIAGNOSIS — I5022 Chronic systolic (congestive) heart failure: Secondary | ICD-10-CM

## 2015-03-06 DIAGNOSIS — I429 Cardiomyopathy, unspecified: Secondary | ICD-10-CM

## 2015-03-06 DIAGNOSIS — I428 Other cardiomyopathies: Secondary | ICD-10-CM

## 2015-03-06 NOTE — Progress Notes (Signed)
Remote ICD transmission.   

## 2015-03-07 ENCOUNTER — Encounter (HOSPITAL_COMMUNITY): Payer: Medicare HMO

## 2015-03-07 ENCOUNTER — Other Ambulatory Visit (HOSPITAL_COMMUNITY): Payer: Self-pay | Admitting: Cardiology

## 2015-03-07 DIAGNOSIS — I509 Heart failure, unspecified: Secondary | ICD-10-CM | POA: Insufficient documentation

## 2015-03-08 ENCOUNTER — Ambulatory Visit (HOSPITAL_COMMUNITY): Admission: RE | Admit: 2015-03-08 | Payer: Medicare HMO | Source: Ambulatory Visit

## 2015-03-11 ENCOUNTER — Telehealth (HOSPITAL_COMMUNITY): Payer: Self-pay | Admitting: *Deleted

## 2015-03-11 MED ORDER — SACUBITRIL-VALSARTAN 24-26 MG PO TABS: 1.0000 | ORAL_TABLET | Freq: Two times a day (BID) | ORAL | Status: DC

## 2015-03-11 NOTE — Telephone Encounter (Signed)
Pt called to request refill of Entresto, she states at last OV dose was decreased and she would, like new rx for lower dose tabs, new rx sent in

## 2015-03-12 ENCOUNTER — Telehealth (HOSPITAL_COMMUNITY): Payer: Self-pay | Admitting: *Deleted

## 2015-03-12 ENCOUNTER — Encounter (HOSPITAL_COMMUNITY)
Admission: RE | Admit: 2015-03-12 | Discharge: 2015-03-12 | Disposition: A | Discharge status: Home / Self Care | Payer: Self-pay | Source: Ambulatory Visit | Attending: Cardiology | Admitting: Cardiology

## 2015-03-12 NOTE — Telephone Encounter (Signed)
Pre cert for CT BODY W & W/O was approved through [place] on 03/08/2015 at 10:58 AM. To be performed [id number] Pre cert approval [id number] exp. 06/06/15   This morning Pre services emailed me that the order had been changed to [place]... That requires a pre cert of its own.  Heather Smith has a 48-72 hour turn around on pre certifications on procedures.  The order was changed on 03/08/15 by Heather Smith and our office was not notified until today 2/7 by pre services.  I contacted [employer] and asked why this order was changed she told me the patient did not require with and without therefore she did not need twice the radiation.  I told her that these procedures require prior approval from insurance and im not sure if we will have an approval by 03/14/15.  I will call [employer] this afternoon as i am in direct patient care until 4pm. If i can not get an approval they will have to reschedule pt.

## 2015-03-12 NOTE — Telephone Encounter (Signed)
Pt approved to CT abd/pel w contrast. Heather Smith on appt note

## 2015-03-12 NOTE — Telephone Encounter (Signed)
Documentation edit* last phone note should say [employer] not [place].

## 2015-03-13 ENCOUNTER — Encounter: Payer: Self-pay | Admitting: Cardiology

## 2015-03-14 ENCOUNTER — Encounter (HOSPITAL_COMMUNITY): Payer: Self-pay

## 2015-03-14 ENCOUNTER — Ambulatory Visit (HOSPITAL_COMMUNITY)
Admission: RE | Admit: 2015-03-14 | Discharge: 2015-03-14 | Disposition: A | Discharge status: Home / Self Care | Payer: Medicare HMO | Source: Ambulatory Visit | Attending: Cardiology | Admitting: Cardiology

## 2015-03-14 ENCOUNTER — Encounter (HOSPITAL_COMMUNITY)
Admission: RE | Admit: 2015-03-14 | Discharge: 2015-03-14 | Disposition: A | Discharge status: Home / Self Care | Payer: Self-pay | Source: Ambulatory Visit | Attending: Cardiology | Admitting: Cardiology

## 2015-03-14 DIAGNOSIS — R911 Solitary pulmonary nodule: Secondary | ICD-10-CM | POA: Diagnosis not present

## 2015-03-14 DIAGNOSIS — R109 Unspecified abdominal pain: Secondary | ICD-10-CM

## 2015-03-14 DIAGNOSIS — K5900 Constipation, unspecified: Secondary | ICD-10-CM | POA: Diagnosis not present

## 2015-03-14 DIAGNOSIS — R110 Nausea: Secondary | ICD-10-CM | POA: Insufficient documentation

## 2015-03-14 DIAGNOSIS — R1084 Generalized abdominal pain: Secondary | ICD-10-CM | POA: Insufficient documentation

## 2015-03-14 MED ORDER — IOHEXOL 300 MG/ML  SOLN
75.0000 mL | Freq: Once | INTRAMUSCULAR | Status: AC | PRN
  Administered 2015-03-14: 75 mL via INTRAVENOUS

## 2015-03-19 ENCOUNTER — Encounter (HOSPITAL_COMMUNITY)
Admission: RE | Admit: 2015-03-19 | Discharge: 2015-03-19 | Disposition: A | Discharge status: Home / Self Care | Payer: Self-pay | Source: Ambulatory Visit | Attending: Cardiology | Admitting: Cardiology

## 2015-03-19 ENCOUNTER — Encounter: Payer: Self-pay | Admitting: Cardiology

## 2015-03-19 LAB — CUP PACEART REMOTE DEVICE CHECK
Battery Remaining Longevity: 45 mo
Battery Voltage: 2.96 V
Brady Statistic AP VP Percent: 40.81 %
Brady Statistic AP VS Percent: 0.74 %
Brady Statistic RA Percent Paced: 41.55 %
Brady Statistic RV Percent Paced: 94.29 %
Date Time Interrogation Session: 20170201072603
HighPow Impedance: 75 Ohm
Implantable Lead Implant Date: 20150121
Implantable Lead Location: 753859
Implantable Lead Model: 4298
Implantable Lead Model: 5076
Lead Channel Impedance Value: 323 Ohm
Lead Channel Impedance Value: 380 Ohm
Lead Channel Impedance Value: 380 Ohm
Lead Channel Impedance Value: 437 Ohm
Lead Channel Impedance Value: 570 Ohm
Lead Channel Impedance Value: 722 Ohm
Lead Channel Pacing Threshold Amplitude: 0.5 V
Lead Channel Pacing Threshold Amplitude: 0.625 V
Lead Channel Pacing Threshold Pulse Width: 0.4 ms
Lead Channel Sensing Intrinsic Amplitude: 14.625 mV
Lead Channel Sensing Intrinsic Amplitude: 14.625 mV
Lead Channel Setting Pacing Amplitude: 1.5 V
Lead Channel Setting Pacing Amplitude: 2 V
Lead Channel Setting Pacing Amplitude: 2.25 V
Lead Channel Setting Pacing Pulse Width: 0.4 ms
Lead Channel Setting Pacing Pulse Width: 0.7 ms
Lead Channel Setting Sensing Sensitivity: 0.3 mV
MDC IDC LEAD IMPLANT DT: 20150121
MDC IDC LEAD IMPLANT DT: 20150121
MDC IDC LEAD LOCATION: 753858
MDC IDC LEAD LOCATION: 753860
MDC IDC MSMT LEADCHNL LV IMPEDANCE VALUE: 380 Ohm
MDC IDC MSMT LEADCHNL LV IMPEDANCE VALUE: 532 Ohm
MDC IDC MSMT LEADCHNL LV IMPEDANCE VALUE: 589 Ohm
MDC IDC MSMT LEADCHNL LV IMPEDANCE VALUE: 627 Ohm
MDC IDC MSMT LEADCHNL LV IMPEDANCE VALUE: 627 Ohm
MDC IDC MSMT LEADCHNL LV PACING THRESHOLD AMPLITUDE: 1.125 V
MDC IDC MSMT LEADCHNL LV PACING THRESHOLD PULSEWIDTH: 0.7 ms
MDC IDC MSMT LEADCHNL RA IMPEDANCE VALUE: 456 Ohm
MDC IDC MSMT LEADCHNL RA SENSING INTR AMPL: 2.25 mV
MDC IDC MSMT LEADCHNL RA SENSING INTR AMPL: 2.25 mV
MDC IDC MSMT LEADCHNL RV IMPEDANCE VALUE: 589 Ohm
MDC IDC MSMT LEADCHNL RV PACING THRESHOLD PULSEWIDTH: 0.4 ms
MDC IDC STAT BRADY AS VP PERCENT: 57 %
MDC IDC STAT BRADY AS VS PERCENT: 1.45 %

## 2015-03-21 ENCOUNTER — Encounter (HOSPITAL_COMMUNITY)
Admission: RE | Admit: 2015-03-21 | Discharge: 2015-03-21 | Disposition: A | Discharge status: Home / Self Care | Payer: Self-pay | Source: Ambulatory Visit | Attending: Cardiology | Admitting: Cardiology

## 2015-03-26 ENCOUNTER — Encounter (HOSPITAL_COMMUNITY)
Admission: RE | Admit: 2015-03-26 | Discharge: 2015-03-26 | Disposition: A | Discharge status: Home / Self Care | Payer: Self-pay | Source: Ambulatory Visit | Attending: Cardiology | Admitting: Cardiology

## 2015-03-28 ENCOUNTER — Encounter (HOSPITAL_COMMUNITY)
Admission: RE | Admit: 2015-03-28 | Discharge: 2015-03-28 | Disposition: A | Discharge status: Home / Self Care | Payer: Self-pay | Source: Ambulatory Visit | Attending: Cardiology | Admitting: Cardiology

## 2015-04-02 ENCOUNTER — Encounter (HOSPITAL_COMMUNITY)
Admission: RE | Admit: 2015-04-02 | Discharge: 2015-04-02 | Disposition: A | Discharge status: Home / Self Care | Payer: Self-pay | Source: Ambulatory Visit | Attending: Cardiology | Admitting: Cardiology

## 2015-04-02 ENCOUNTER — Encounter: Payer: Self-pay | Admitting: Cardiology

## 2015-04-04 ENCOUNTER — Encounter (HOSPITAL_COMMUNITY): Payer: Self-pay

## 2015-04-04 DIAGNOSIS — I509 Heart failure, unspecified: Secondary | ICD-10-CM | POA: Insufficient documentation

## 2015-04-09 ENCOUNTER — Encounter (HOSPITAL_COMMUNITY)
Admission: RE | Admit: 2015-04-09 | Discharge: 2015-04-09 | Disposition: A | Discharge status: Home / Self Care | Payer: Self-pay | Source: Ambulatory Visit | Attending: Cardiology | Admitting: Cardiology

## 2015-04-11 ENCOUNTER — Encounter (HOSPITAL_COMMUNITY)
Admission: RE | Admit: 2015-04-11 | Discharge: 2015-04-11 | Disposition: A | Discharge status: Home / Self Care | Payer: Self-pay | Source: Ambulatory Visit | Attending: Cardiology | Admitting: Cardiology

## 2015-04-16 ENCOUNTER — Encounter (HOSPITAL_COMMUNITY)
Admission: RE | Admit: 2015-04-16 | Discharge: 2015-04-16 | Disposition: A | Discharge status: Home / Self Care | Payer: Self-pay | Source: Ambulatory Visit | Attending: Cardiology | Admitting: Cardiology

## 2015-04-18 ENCOUNTER — Encounter (HOSPITAL_COMMUNITY)
Admission: RE | Admit: 2015-04-18 | Discharge: 2015-04-18 | Disposition: A | Discharge status: Home / Self Care | Payer: Self-pay | Source: Ambulatory Visit | Attending: Cardiology | Admitting: Cardiology

## 2015-04-23 ENCOUNTER — Encounter (HOSPITAL_COMMUNITY)
Admission: RE | Admit: 2015-04-23 | Discharge: 2015-04-23 | Disposition: A | Discharge status: Home / Self Care | Payer: Self-pay | Source: Ambulatory Visit | Attending: Cardiology | Admitting: Cardiology

## 2015-04-25 ENCOUNTER — Encounter (HOSPITAL_COMMUNITY): Payer: Self-pay

## 2015-04-30 ENCOUNTER — Encounter (HOSPITAL_COMMUNITY)
Admission: RE | Admit: 2015-04-30 | Discharge: 2015-04-30 | Disposition: A | Discharge status: Home / Self Care | Payer: Self-pay | Source: Ambulatory Visit | Attending: Cardiology | Admitting: Cardiology

## 2015-05-02 ENCOUNTER — Encounter (HOSPITAL_COMMUNITY)
Admission: RE | Admit: 2015-05-02 | Discharge: 2015-05-02 | Disposition: A | Discharge status: Home / Self Care | Payer: Self-pay | Source: Ambulatory Visit | Attending: Cardiology | Admitting: Cardiology

## 2015-05-03 ENCOUNTER — Ambulatory Visit (INDEPENDENT_AMBULATORY_CARE_PROVIDER_SITE_OTHER): Payer: Medicare HMO

## 2015-05-03 ENCOUNTER — Encounter: Payer: Self-pay | Admitting: Podiatry

## 2015-05-03 ENCOUNTER — Ambulatory Visit (INDEPENDENT_AMBULATORY_CARE_PROVIDER_SITE_OTHER): Payer: Medicare HMO | Admitting: Podiatry

## 2015-05-03 VITALS — BP 132/71 | HR 61 | Resp 16 | Ht 67.5 in | Wt 180.0 lb

## 2015-05-03 DIAGNOSIS — G629 Polyneuropathy, unspecified: Secondary | ICD-10-CM | POA: Diagnosis not present

## 2015-05-03 DIAGNOSIS — M79672 Pain in left foot: Secondary | ICD-10-CM

## 2015-05-03 DIAGNOSIS — M79671 Pain in right foot: Secondary | ICD-10-CM

## 2015-05-03 MED ORDER — GABAPENTIN 300 MG PO CAPS: 300.0000 mg | ORAL_CAPSULE | Freq: Three times a day (TID) | ORAL | Status: DC

## 2015-05-03 NOTE — Progress Notes (Signed)
Subjective:     Patient ID: Heather Smith, female   DOB: 08/22/1932, 82 y.o.   MRN: [id number]  HPI patient states that had a lot of numbness in both my feet and I just think I might have neuropathy and they get very painful also with stinging burning pain   Review of Systems  All other systems reviewed and are negative.      Objective:   Physical Exam  Constitutional: She is oriented to person, place, and time.  Cardiovascular: Intact distal pulses.   Musculoskeletal: Normal range of motion.  Neurological: She is oriented to person, place, and time.  Skin: Skin is warm and dry.  Nursing note and vitals reviewed.  vascular status was found to be intact DP PT pulses bilateral. Patient is noted to have neurological loss with diminished sharp Dole vibratory noted bilateral with no organic cause. Pain is present in the feet in a nondescript pattern with no area that it seems to come from. Good digital perfusion and well oriented 3     Assessment:     Neuropathic changes bilateral    Plan:     H&P and conditions and x-rays reviewed with patient. Today were to start her on gabapentin and one at night and if she could tolerated she'll go to 2 with 3 day and high vitamin complex to help with neuropathy. She'll be seen back in 2 months to reevaluate  Report indicates no signs stress fracture arthritis and appears to be more of an inflammatory or neuropathic condition

## 2015-05-03 NOTE — Progress Notes (Signed)
   Subjective:    Patient ID: Heather Smith, female    DOB: 12-Nov-1932, 82 y.o.   MRN: [id number]  HPI Patient presents with bilateral foot pain; dorsal & numbness on bottom of feet; toes numb. Pt stated, "hard to walk on feet; feet feel funny"; x2 yrs   Review of Systems  Musculoskeletal: Positive for back pain, arthralgias and gait problem.  Neurological: Positive for numbness.  All other systems reviewed and are negative.      Objective:   Physical Exam        Assessment & Plan:

## 2015-05-07 ENCOUNTER — Encounter (HOSPITAL_COMMUNITY): Payer: Self-pay

## 2015-05-07 DIAGNOSIS — I509 Heart failure, unspecified: Secondary | ICD-10-CM | POA: Insufficient documentation

## 2015-05-09 ENCOUNTER — Encounter (HOSPITAL_COMMUNITY)
Admission: RE | Admit: 2015-05-09 | Discharge: 2015-05-09 | Disposition: A | Discharge status: Home / Self Care | Payer: Self-pay | Source: Ambulatory Visit | Attending: Cardiology | Admitting: Cardiology

## 2015-05-13 ENCOUNTER — Other Ambulatory Visit (HOSPITAL_COMMUNITY): Payer: Self-pay | Admitting: Internal Medicine

## 2015-05-14 ENCOUNTER — Encounter (HOSPITAL_COMMUNITY): Payer: Self-pay

## 2015-05-16 ENCOUNTER — Encounter (HOSPITAL_COMMUNITY): Payer: Self-pay

## 2015-05-20 DIAGNOSIS — M25562 Pain in left knee: Secondary | ICD-10-CM | POA: Diagnosis not present

## 2015-05-20 DIAGNOSIS — M25561 Pain in right knee: Secondary | ICD-10-CM | POA: Diagnosis not present

## 2015-05-21 ENCOUNTER — Encounter (HOSPITAL_COMMUNITY): Payer: Self-pay

## 2015-05-23 ENCOUNTER — Encounter (HOSPITAL_COMMUNITY)
Admission: RE | Admit: 2015-05-23 | Discharge: 2015-05-23 | Disposition: A | Discharge status: Home / Self Care | Payer: Self-pay | Source: Ambulatory Visit | Attending: Cardiology | Admitting: Cardiology

## 2015-05-28 ENCOUNTER — Encounter (HOSPITAL_COMMUNITY)
Admission: RE | Admit: 2015-05-28 | Discharge: 2015-05-28 | Disposition: A | Discharge status: Home / Self Care | Payer: Self-pay | Source: Ambulatory Visit | Attending: Cardiology | Admitting: Cardiology

## 2015-05-28 DIAGNOSIS — M1712 Unilateral primary osteoarthritis, left knee: Secondary | ICD-10-CM | POA: Diagnosis not present

## 2015-05-28 DIAGNOSIS — M1711 Unilateral primary osteoarthritis, right knee: Secondary | ICD-10-CM | POA: Diagnosis not present

## 2015-05-30 ENCOUNTER — Encounter (HOSPITAL_COMMUNITY)
Admission: RE | Admit: 2015-05-30 | Discharge: 2015-05-30 | Disposition: A | Discharge status: Home / Self Care | Payer: Self-pay | Source: Ambulatory Visit | Attending: Cardiology | Admitting: Cardiology

## 2015-06-04 ENCOUNTER — Encounter (HOSPITAL_COMMUNITY)
Admission: RE | Admit: 2015-06-04 | Discharge: 2015-06-04 | Disposition: A | Discharge status: Home / Self Care | Payer: Self-pay | Source: Ambulatory Visit | Attending: Cardiology | Admitting: Cardiology

## 2015-06-04 DIAGNOSIS — I509 Heart failure, unspecified: Secondary | ICD-10-CM | POA: Insufficient documentation

## 2015-06-05 ENCOUNTER — Ambulatory Visit (INDEPENDENT_AMBULATORY_CARE_PROVIDER_SITE_OTHER): Payer: Medicare HMO | Admitting: *Deleted

## 2015-06-05 DIAGNOSIS — I429 Cardiomyopathy, unspecified: Secondary | ICD-10-CM

## 2015-06-05 DIAGNOSIS — I428 Other cardiomyopathies: Secondary | ICD-10-CM

## 2015-06-05 DIAGNOSIS — I5022 Chronic systolic (congestive) heart failure: Secondary | ICD-10-CM | POA: Diagnosis not present

## 2015-06-06 ENCOUNTER — Encounter (HOSPITAL_COMMUNITY): Payer: Self-pay

## 2015-06-06 ENCOUNTER — Ambulatory Visit (HOSPITAL_COMMUNITY)
Admission: RE | Admit: 2015-06-06 | Discharge: 2015-06-06 | Disposition: A | Discharge status: Home / Self Care | Payer: Medicare HMO | Source: Ambulatory Visit | Attending: Cardiology | Admitting: Cardiology

## 2015-06-06 VITALS — BP 109/58 | HR 60 | Resp 18 | Wt 190.0 lb

## 2015-06-06 DIAGNOSIS — I428 Other cardiomyopathies: Secondary | ICD-10-CM | POA: Diagnosis not present

## 2015-06-06 DIAGNOSIS — E785 Hyperlipidemia, unspecified: Secondary | ICD-10-CM | POA: Diagnosis not present

## 2015-06-06 DIAGNOSIS — G629 Polyneuropathy, unspecified: Secondary | ICD-10-CM | POA: Insufficient documentation

## 2015-06-06 DIAGNOSIS — I493 Ventricular premature depolarization: Secondary | ICD-10-CM | POA: Diagnosis not present

## 2015-06-06 DIAGNOSIS — Z79899 Other long term (current) drug therapy: Secondary | ICD-10-CM | POA: Insufficient documentation

## 2015-06-06 DIAGNOSIS — R69 Illness, unspecified: Secondary | ICD-10-CM | POA: Diagnosis not present

## 2015-06-06 DIAGNOSIS — F329 Major depressive disorder, single episode, unspecified: Secondary | ICD-10-CM | POA: Diagnosis not present

## 2015-06-06 DIAGNOSIS — I5022 Chronic systolic (congestive) heart failure: Secondary | ICD-10-CM | POA: Diagnosis not present

## 2015-06-06 DIAGNOSIS — R911 Solitary pulmonary nodule: Secondary | ICD-10-CM | POA: Insufficient documentation

## 2015-06-06 DIAGNOSIS — Z8249 Family history of ischemic heart disease and other diseases of the circulatory system: Secondary | ICD-10-CM | POA: Insufficient documentation

## 2015-06-06 DIAGNOSIS — Z95810 Presence of automatic (implantable) cardiac defibrillator: Secondary | ICD-10-CM | POA: Insufficient documentation

## 2015-06-06 LAB — BASIC METABOLIC PANEL
Anion gap: 11 (ref 5–15)
BUN: 29 mg/dL — ABNORMAL HIGH (ref 6–20)
CALCIUM: 9.2 mg/dL (ref 8.9–10.3)
CO2: 27 mmol/L (ref 22–32)
CREATININE: 1.38 mg/dL — AB (ref 0.44–1.00)
Chloride: 102 mmol/L (ref 101–111)
GFR calc non Af Amer: 35 mL/min — ABNORMAL LOW (ref >60)
GFR, EST AFRICAN AMERICAN: 40 mL/min — AB (ref >60)
Glucose, Bld: 98 mg/dL (ref 65–99)
Potassium: 4.2 mmol/L (ref 3.5–5.1)
SODIUM: 140 mmol/L (ref 135–145)

## 2015-06-06 NOTE — Progress Notes (Signed)
Remote ICD transmission.   

## 2015-06-06 NOTE — Patient Instructions (Signed)
Routine lab work today. Will notify you of abnormal results  Follow up with Dr.McLean in 6 months  

## 2015-06-07 NOTE — Progress Notes (Signed)
Patient ID: Heather Smith, female   DOB: 10-11-1932, 82 y.o.   MRN: [id number] PCP: Dr. Kenton Kingfisher HF Cardiology: Aundra Dubin  82 yo with history of nonischemic cardiomyopathy and chronic systolic HF.  Patient has felt like her energy level has been low for several years.  In 9/14, she was admitted [facility] with [facility].  Cardiac cath was done, showing no significant coronary disease.  However, echo showed EF 30-35%.  She was started on meds for CHF, and echo was repeated in 12/14, EF was read as 20-25%.  She had a Medtonic CRT-D device placed (had baseline LBBB).  In 3/15, the LV lead was not capturing properly so device had to be reprogrammed.  She had AV optimization in 5/15.  Limited echo at that time showed improvement in EF to 40-45%.  CPX in 5/15 was submaximal but suggested low normal to mildly reduced functional capacity. Last echo in 6/16 showed EF 35-40% with diffuse hypokinesis.  CPX was done in 9/16.  This actually showed good functional capacity. She had repeat echo in 1/17, showing EF increased to 60-65%.    At last appointment, I decreased her Coreg.  She has also been going to pulmonary rehab. BP is running a little higher and she seems to be doing better overall. Not getting short of breath walking on flat ground now, still short of breath walking up incline to her mailbox.  No chest pain.  She has been diagnosed with peripheral neuropathy and is now on gabapentin.   She needs to have surgery for left TKR.   Labs (9/14): TSH normal Labs (3/15): K 4.7, creatinine 1.1 Labs (5/15): K 4.9, creatinine 1.1, BNP 44 Labs (6/15): K 5.1, creatinine 1.4, pro-BNP 39 Labs (6/15): K 5.3, creatinine 1.53 Labs (12/15): K 4.4, creatinine 1.03 Labs (5/16): K 4, creatinine 1.0, BNP 34.6 Labs (6/16): K 4.3, creatinine 1.1 Labs (9/16): K 4.4, creatinine 1.16 Labs (11/16): K 4.5, creatinine 1.16 Labs (12/16): K 4.6, creatinine 1.16, BNP 22, Mg 1.9 Labs (1/17): K 4.3, creatinine 1.34, HCT 42.4, ESR  normal  PMH: 1. Chronic systolic CHF: Nonischemic cardiomyopathy.  LHC (9/14) with no significant coronary disease, EF 25-30% with global hypokinesis.  Echo (9/14) with EF 30-35%, moderate LV dilation, diffuse hypokinesis.  Echo (12/14) with EF 20-25%.  She does not drink ETOH.  She had Medtronic CRT-D device implantation in 1/15. Echo (5/15) with EF 40-45%, mild LVH.  CPX (5/15) was submaximal with RER 0.99, peak VO2 14, VE/VCO2 37.3; low normal functional capacity may be limited by loss of BiV pacing at higher HR and by deconditioning but interpretation somewhat limited since study was submaximal.  Echo (6/16) with EF 35-40%, severe LV dilation, diffuse hypokinesis, mild MR.  CPX (9/16) with peak VO2 17.7, VE/VCO2 slope 32.3, RER 1.11 => excellent functional capacity. Echo (11/17) with EF 60-65%.  2. Back pain s/p lumbar laminectomy 3. OA: Knees.  4. Hyperlipidemia 5. Depression 6. PVCs 7. Peripheral neuropathy 8. Lung nodule  SH: Lives [place], married, never smoked, no ETOH.    FH: Mother with MI at 47, possible ischemic cardiomyopathy.  Father with cardiomyopathy, died at 53.  She has 5 brothers, none with significant coronary disease.    ROS: All systems reviewed and negative except as per HPI.   Current Outpatient Prescriptions  Medication Sig Dispense Refill  . Ascorbic Acid (VITAMIN C) 1000 MG tablet Take 1,000 mg by mouth daily.     . B Complex Vitamins (VITAMIN B  COMPLEX PO) Take 1 tablet by mouth daily. Reported on 01/30/2015    . Biotin (BIOTIN 5000) 5 MG CAPS Take 5 mg by mouth daily.     . carvedilol (COREG) 6.25 MG tablet Take 1 tablet (6.25 mg total) by mouth 2 (two) times daily. 180 tablet 3  . cholecalciferol (VITAMIN D) 1000 UNITS tablet Take 1,000 Units by mouth daily.      . citalopram (CELEXA) 20 MG tablet Take 20 mg by mouth daily.    . Coenzyme Q10 (COQ10) 100 MG CAPS Take 1 capsule by mouth daily.    . furosemide (LASIX) 20 MG tablet TAKE ONE TABLET BY  MOUTH ONCE DAILY ON MONDAY, WEDNESDAY AND FRIDAY 12 tablet 3  . gabapentin (NEURONTIN) 300 MG capsule Take 300 mg by mouth 2 (two) times daily.    Marland Kitchen levothyroxine (SYNTHROID, LEVOTHROID) 50 MCG tablet Take 50 mcg by mouth daily.      . Magnesium Oxide (MAG-200 PO) Take 1 tablet by mouth 2 (two) times daily.     . multivitamin (THERAGRAN) per tablet Take 1 tablet by mouth daily.      . pantoprazole (PROTONIX) 40 MG tablet Take 40 mg by mouth daily.    . sacubitril-valsartan (ENTRESTO) 24-26 MG Take 1 tablet by mouth 2 (two) times daily. 60 tablet 6  . spironolactone (ALDACTONE) 25 MG tablet Take 1 tablet (25 mg total) by mouth daily. 90 tablet 3  . traMADol (ULTRAM) 50 MG tablet Take 1 tablet by mouth every 12 (twelve) hours as needed for moderate pain.     . vitamin E 400 UNIT capsule Take 400 Units by mouth daily.    Marland Kitchen zolpidem (AMBIEN CR) 6.25 MG CR tablet Take 1 tablet (6.25 mg total) by mouth at bedtime as needed for sleep. 30 tablet 0   No current facility-administered medications for this encounter.   Filed Vitals:   06/06/15 1345  BP: 109/58  Pulse: 60  Resp: 18  Weight: 190 lb (86.183 kg)  SpO2: 96%    General: NAD Neck: JVP 7, no thyromegaly or thyroid nodule.  Lungs: Clear to auscultation bilaterally with normal respiratory effort. CV: Nondisplaced PMI.  Heart regular S1/S2, no S3/S4, 1/6 HSM at apex.  No peripheral edema.  No carotid bruit.  Normal pedal pulses.  Abdomen: Soft, nontender, no hepatosplenomegaly, no distention.  Skin: Intact without lesions or rashes.  Neurologic: Alert and oriented x 3.  Psych: Normal affect. Extremities: No clubbing or cyanosis.   Assessment/Plan:  1. Chronic systolic CHF: Nonischemic cardiomyopathy s/p Medtronic CRT-D. EF 35-40% (6/16). She does have a family history of cardiomyopathy of uncertain etiology (father) but none of her siblings developed a cardiomyopathy.  Familial cardiomyopathy is a consideration.  Viral myocarditis is  also a possible etiology.  Echo in 1/17 showed improvement in EF to 60-65%. Symptoms have been out of proportion to objective findings.  Her CPX showed excellent functional capacity. I have thought that some of her fatigue and symptomatology may be due to deconditioning and depression.  With pulmonary rehab and dropping back on Coreg, she seems to be doing better.  - Continue pulmonary rehab.  - She can stop Lasix, use as needed.  - Continue current Entresto, Coreg, and spironolactone.  BMET today.  2. Depression: Improved on Celexa.  3. PVCs: Not feeling palpitations very often now.  4. Lung nodule: Need CT chest in 2/18 to followup on this.   Loralie Champagne 06/07/2015

## 2015-06-11 ENCOUNTER — Encounter (HOSPITAL_COMMUNITY): Payer: Self-pay

## 2015-06-13 ENCOUNTER — Encounter (HOSPITAL_COMMUNITY): Payer: Self-pay

## 2015-06-18 ENCOUNTER — Encounter (HOSPITAL_COMMUNITY)
Admission: RE | Admit: 2015-06-18 | Discharge: 2015-06-18 | Disposition: A | Discharge status: Home / Self Care | Payer: Self-pay | Source: Ambulatory Visit | Attending: Cardiology | Admitting: Cardiology

## 2015-06-19 ENCOUNTER — Encounter: Payer: Self-pay | Admitting: Internal Medicine

## 2015-06-20 ENCOUNTER — Encounter (HOSPITAL_COMMUNITY)
Admission: RE | Admit: 2015-06-20 | Discharge: 2015-06-20 | Disposition: A | Discharge status: Home / Self Care | Payer: Self-pay | Source: Ambulatory Visit | Attending: Cardiology | Admitting: Cardiology

## 2015-06-21 ENCOUNTER — Telehealth (HOSPITAL_COMMUNITY): Payer: Self-pay | Admitting: *Deleted

## 2015-06-21 NOTE — Telephone Encounter (Signed)
Received note from [place], pt needs clearance for L TKA w/Dr Mayer Camel.  Per Dr Aundra Dubin, pt ok for surgery, note faxed to [facility] at [phone]

## 2015-06-25 ENCOUNTER — Encounter (HOSPITAL_COMMUNITY)
Admission: RE | Admit: 2015-06-25 | Discharge: 2015-06-25 | Disposition: A | Discharge status: Home / Self Care | Payer: Self-pay | Source: Ambulatory Visit | Attending: Cardiology | Admitting: Cardiology

## 2015-06-27 ENCOUNTER — Encounter (HOSPITAL_COMMUNITY)
Admission: RE | Admit: 2015-06-27 | Discharge: 2015-06-27 | Disposition: A | Discharge status: Home / Self Care | Payer: Self-pay | Source: Ambulatory Visit | Attending: Cardiology | Admitting: Cardiology

## 2015-07-02 ENCOUNTER — Encounter (HOSPITAL_COMMUNITY)
Admission: RE | Admit: 2015-07-02 | Discharge: 2015-07-02 | Disposition: A | Discharge status: Home / Self Care | Payer: Self-pay | Source: Ambulatory Visit | Attending: Cardiology | Admitting: Cardiology

## 2015-07-03 ENCOUNTER — Ambulatory Visit (INDEPENDENT_AMBULATORY_CARE_PROVIDER_SITE_OTHER): Payer: Medicare HMO | Admitting: Podiatry

## 2015-07-03 ENCOUNTER — Other Ambulatory Visit: Payer: Self-pay | Admitting: Orthopedic Surgery

## 2015-07-03 DIAGNOSIS — G629 Polyneuropathy, unspecified: Secondary | ICD-10-CM | POA: Diagnosis not present

## 2015-07-03 DIAGNOSIS — M7661 Achilles tendinitis, right leg: Secondary | ICD-10-CM

## 2015-07-04 ENCOUNTER — Encounter (HOSPITAL_COMMUNITY)
Admission: RE | Admit: 2015-07-04 | Discharge: 2015-07-04 | Disposition: A | Discharge status: Home / Self Care | Payer: Medicare HMO | Source: Ambulatory Visit | Attending: Orthopedic Surgery | Admitting: Orthopedic Surgery

## 2015-07-04 ENCOUNTER — Encounter (HOSPITAL_COMMUNITY)
Admission: RE | Admit: 2015-07-04 | Discharge: 2015-07-04 | Disposition: A | Discharge status: Home / Self Care | Payer: Self-pay | Source: Ambulatory Visit | Attending: Cardiology | Admitting: Cardiology

## 2015-07-04 ENCOUNTER — Encounter (HOSPITAL_COMMUNITY): Payer: Self-pay

## 2015-07-04 DIAGNOSIS — M1712 Unilateral primary osteoarthritis, left knee: Secondary | ICD-10-CM | POA: Insufficient documentation

## 2015-07-04 DIAGNOSIS — E785 Hyperlipidemia, unspecified: Secondary | ICD-10-CM | POA: Insufficient documentation

## 2015-07-04 DIAGNOSIS — Z95810 Presence of automatic (implantable) cardiac defibrillator: Secondary | ICD-10-CM | POA: Diagnosis not present

## 2015-07-04 DIAGNOSIS — Z01812 Encounter for preprocedural laboratory examination: Secondary | ICD-10-CM | POA: Diagnosis not present

## 2015-07-04 DIAGNOSIS — E039 Hypothyroidism, unspecified: Secondary | ICD-10-CM | POA: Diagnosis not present

## 2015-07-04 DIAGNOSIS — Z79899 Other long term (current) drug therapy: Secondary | ICD-10-CM | POA: Insufficient documentation

## 2015-07-04 DIAGNOSIS — I110 Hypertensive heart disease with heart failure: Secondary | ICD-10-CM | POA: Diagnosis not present

## 2015-07-04 DIAGNOSIS — K219 Gastro-esophageal reflux disease without esophagitis: Secondary | ICD-10-CM | POA: Diagnosis not present

## 2015-07-04 DIAGNOSIS — I428 Other cardiomyopathies: Secondary | ICD-10-CM | POA: Insufficient documentation

## 2015-07-04 DIAGNOSIS — Z01818 Encounter for other preprocedural examination: Secondary | ICD-10-CM | POA: Insufficient documentation

## 2015-07-04 DIAGNOSIS — I509 Heart failure, unspecified: Secondary | ICD-10-CM | POA: Insufficient documentation

## 2015-07-04 HISTORY — DX: Adverse effect of unspecified anesthetic, initial encounter: T41.45XA

## 2015-07-04 HISTORY — DX: Other complications of anesthesia, initial encounter: T88.59XA

## 2015-07-04 LAB — BASIC METABOLIC PANEL
Anion gap: 5 (ref 5–15)
BUN: 23 mg/dL — AB (ref 6–20)
CALCIUM: 9.3 mg/dL (ref 8.9–10.3)
CHLORIDE: 105 mmol/L (ref 101–111)
CO2: 28 mmol/L (ref 22–32)
CREATININE: 1.12 mg/dL — AB (ref 0.44–1.00)
GFR calc Af Amer: 52 mL/min — ABNORMAL LOW (ref >60)
GFR calc non Af Amer: 45 mL/min — ABNORMAL LOW (ref >60)
Glucose, Bld: 103 mg/dL — ABNORMAL HIGH (ref 65–99)
Potassium: 4.5 mmol/L (ref 3.5–5.1)
Sodium: 138 mmol/L (ref 135–145)

## 2015-07-04 LAB — CBC
HEMATOCRIT: 39.2 % (ref 36.0–46.0)
Hemoglobin: 12.8 g/dL (ref 12.0–15.0)
MCH: 29.6 pg (ref 26.0–34.0)
MCHC: 32.7 g/dL (ref 30.0–36.0)
MCV: 90.5 fL (ref 78.0–100.0)
Platelets: 173 10*3/uL (ref 150–400)
RBC: 4.33 MIL/uL (ref 3.87–5.11)
RDW: 13.4 % (ref 11.5–15.5)
WBC: 5.8 10*3/uL (ref 4.0–10.5)

## 2015-07-04 LAB — SURGICAL PCR SCREEN
MRSA, PCR: NEGATIVE
Staphylococcus aureus: NEGATIVE

## 2015-07-04 NOTE — Progress Notes (Signed)
PCP - Shirline Frees Cardiologist - Virl Axe  Chest x-ray - denies EKG - 02/18/15 Stress Test - 10/19/14 ECHO - 02/13/15 Cardiac Cath - denies  Patient has an ICD implant.  Faxed paperwork over to dr. Olin Pia office for orders regarding the ICD on day of surgery.  Called [employer] rep Tomi Bamberger about surgery date.  At this time patient does not have a surgery time.  Tomi Bamberger asked if we would call her back to let her know a time    Patient denies shortness of breath and chest pain at PAT appointment

## 2015-07-04 NOTE — Pre-Procedure Instructions (Addendum)
Heather Smith  07/04/2015      [facility] - [place] ([place] [id number] [place] ([place]) [place] [address] Phone: [phone] Fax: [place], [address] [place] [address] Phone: [phone] Fax: [phone]    Your procedure is scheduled on Monday June 12.  Report to [facility] at call at 530 on day of surgery to find out what time you need to be at [facility]  Call this number if you have problems the morning of surgery:  [phone]   Remember:  Do not eat food or drink liquids after midnight.   Take these medicines the morning of surgery with A SIP OF WATER Tylenol, carvedilol (coreg), citalopram (celexa), gabapentin (neurontin), levothyroxine (synthroid), pantoprazole (protonix), tramadol if needed, sacubitril-valsartan (Entresto)  7 days prior to surgery STOP taking any Aspirin, Aleve, Naproxen, Ibuprofen, Motrin, Advil, Goody's, BC's, all herbal medications, fish oil, and all vitamins    Do not wear jewelry, make-up or nail polish.  Do not wear lotions, powders, or perfumes.  You may wear deodorant.  Do not shave 48 hours prior to surgery.  Men may shave face and neck.  Do not bring valuables to the hospital.  [facility] is not responsible for any belongings or valuables.  Contacts, dentures or bridgework may not be worn into surgery.  Leave your suitcase in the car.  After surgery it may be brought to your room.  For patients admitted to the hospital, discharge time will be determined by your treatment team.  Patients discharged the day of surgery will not be allowed to drive home.    Special instructions:   [place]- Preparing For Surgery  Before surgery, you can play an important role. Because skin is not sterile, your skin needs to be as free of germs as possible. You can reduce the number of germs on your skin by  washing with CHG (chlorahexidine gluconate) Soap before surgery.  CHG is an antiseptic cleaner which kills germs and bonds with the skin to continue killing germs even after washing.  Please do not use if you have an allergy to CHG or antibacterial soaps. If your skin becomes reddened/irritated stop using the CHG.  Do not shave (including legs and underarms) for at least 48 hours prior to first CHG shower. It is OK to shave your face.  Please follow these instructions carefully.   1. Shower the NIGHT BEFORE SURGERY and the MORNING OF SURGERY with CHG.   2. If you chose to wash your hair, wash your hair first as usual with your normal shampoo.  3. After you shampoo, rinse your hair and body thoroughly to remove the shampoo.  4. Use CHG as you would any other liquid soap. You can apply CHG directly to the skin and wash gently with a scrungie or a clean washcloth.   5. Apply the CHG Soap to your body ONLY FROM THE NECK DOWN.  Do not use on open wounds or open sores. Avoid contact with your eyes, ears, mouth and genitals (private parts). Wash genitals (private parts) with your normal soap.  6. Wash thoroughly, paying special attention to the area where your surgery will be performed.  7. Thoroughly rinse your body with warm water from the neck down.  8. DO NOT shower/wash with your normal soap after using and rinsing off the CHG Soap.  9. Pat yourself dry with a CLEAN TOWEL.   10. Wear CLEAN PAJAMAS   11. Place CLEAN SHEETS on your bed the night of your first shower and DO NOT SLEEP WITH PETS.    Day of Surgery: Do not apply any deodorants/lotions. Please wear clean clothes to the hospital/surgery center.      Please read over the following fact sheets that you were given. MRSA Information

## 2015-07-05 NOTE — Progress Notes (Signed)
Subjective:     Patient ID: Heather Smith, female   DOB: [birth year], 82 y.o.   MRN: [id number]  HPI patient presents stating I'm still been having some pain in my right foot and I am due to have knee replacement in the next several weeks   Review of Systems     Objective:   Physical Exam Neurovascular status intact muscle strength adequate and no other changes in health history with patient found to have inflammatory changes in the right dorsal tendon complex midtarsal joint    Assessment:     Inflammatory tendinitis right    Plan:     Advised on physical therapy anti-inflammatories and went ahead today and did a careful injection 3 Milligan Kenalog 5 mill grams Xylocaine to reduce inflammation. Reappoint to recheck

## 2015-07-05 NOTE — Progress Notes (Signed)
Anesthesia Chart Review:  Pt is an 82 year old female scheduled for L total knee arthroplasty on 07/15/2015 with Dr. Mayer Camel.   CHF cardiologist is Dr. Loralie Champagne who has cleared pt for surgery. EP cardiologist is Dr. Virl Axe. PCP is Dr. Shirline Frees.   PMH includes:  Non-ischemic cardiomyopathy, ICD-CRT (Metronic, implanted 2014), LBBB, CHF, HTN, hyperlipidemia, hypothyroidism, GERD.   Medications include: carvedilol, lasix, levothyroxine, protonix, sacubitril-valsartan  Preoperative labs reviewed.    EKG 02/18/15: AV pacing with isoelectric QRS in lead V1  Echo 02/13/15:  - Left ventricle: The cavity size was normal. Wall thickness was increased in a pattern of mild LVH. Systolic function was normal. The estimated ejection fraction was in the range of 60% to 65%. Wall motion was normal; there were no regional wall motion abnormalities. Doppler parameters are consistent with abnormal left ventricular relaxation (grade 1 diastolic dysfunction). - Left atrium: The atrium was mildly dilated.  Cardiopulmonary exercise test 10/19/14:  - Exercise testing with gas-exchange demonstrates an excellent functional capacity when compared to matched sedentary norms. The patient does not appear to have any significant circulatory or ventilatory limitations. The mildly elevated Ve/VCO2 slope may reflect diastolic dysfunction.  Cardiac cath 10/04/12:  1. Normal coronary anatomy. 2. Severe left ventricular dysfunction.  Perioperative prescription for ICD form pending.   If no changes, I anticipate pt can proceed with surgery as scheduled.   Willeen Cass, FNP-BC [facility]/Anesthesiology Phone: [phone] 07/05/2015 1:39 PM

## 2015-07-08 ENCOUNTER — Telehealth (HOSPITAL_COMMUNITY): Payer: Self-pay | Admitting: *Deleted

## 2015-07-08 NOTE — Telephone Encounter (Signed)
Per Dr Aundra Dubin pt is cleared to have L TKA, note faxed to Dr Mayer Camel at [facility] at [phone]

## 2015-07-09 ENCOUNTER — Encounter (HOSPITAL_COMMUNITY)
Admission: RE | Admit: 2015-07-09 | Discharge: 2015-07-09 | Disposition: A | Discharge status: Home / Self Care | Payer: Self-pay | Source: Ambulatory Visit | Attending: Cardiology | Admitting: Cardiology

## 2015-07-11 ENCOUNTER — Encounter (HOSPITAL_COMMUNITY)
Admission: RE | Admit: 2015-07-11 | Discharge: 2015-07-11 | Disposition: A | Discharge status: Home / Self Care | Payer: Self-pay | Source: Ambulatory Visit | Attending: Cardiology | Admitting: Cardiology

## 2015-07-12 ENCOUNTER — Encounter: Payer: Self-pay | Admitting: Cardiology

## 2015-07-12 MED ORDER — DEXTROSE 5 % IV SOLN
3.0000 g | INTRAVENOUS | Status: DC
  Filled 2015-07-12: qty 3000

## 2015-07-12 NOTE — H&P (Signed)
TOTAL KNEE ADMISSION H&P  Patient is being admitted for left total knee arthroplasty.  Subjective:  Chief Complaint:left knee pain.  HPI: Heather Smith, 82 y.o. female, has a history of pain and functional disability in the left knee due to arthritis and has failed non-surgical conservative treatments for greater than 12 weeks to includeNSAID's and/or analgesics, flexibility and strengthening excercises, use of assistive devices, weight reduction as appropriate and activity modification.  Onset of symptoms was gradual, starting 3 years ago with gradually worsening course since that time. The patient noted prior procedures on the knee to include  arthroscopy on the left knee(s).  Patient currently rates pain in the left knee(s) at 10 out of 10 with activity. Patient has night pain, worsening of pain with activity and weight bearing, pain that interferes with activities of daily living, pain with passive range of motion, crepitus and joint swelling.  Patient has evidence of periarticular osteophytes and joint space narrowing by imaging studies.  There is no active infection.  Patient Active Problem List   Diagnosis Date Noted  . Abdominal pain 03/05/2015  . Depression 06/06/2013  . Biventricular defibrillator  Medtronic 05/30/2013  . LV lead partial dislodgment 05/30/2013  . Nonischemic cardiomyopathy ([place]) 02/08/2013  . PVC (premature ventricular contraction) 02/08/2013  . LBBB (left bundle branch block) 02/08/2013  . Sinus bradycardia 02/08/2013  . Chronic systolic heart failure ([place]) 12/15/2012  . Precordial pain 10/05/2012  . HTN (hypertension) 08/22/2010  . Hyperlipemia 08/22/2010   Past Medical History  Diagnosis Date  . Hypertension   . Hypercholesterolemia   . Left bundle branch block   . Hypothyroidism   . Spinal stenosis   . GERD (gastroesophageal reflux disease)   . Vitamin D deficiency   . Neurogenic claudication due to lumbar spinal stenosis   . Chest pain     a. Nl  cath in the 90's;  b. 04/2005: Myoview: subtle areao of ? reversibility in apical segment of anteromedial LV - ? attenuation, EF 54%;  c. 05/2012 MV: small, fixed mild apical septal perfusion defect, ? attenuation due to lbbb, no ischemia.  . Non-ischemic cardiomyopathy ([place])     [place] 10/04/12: Normal coronary arteries, EF 25-30% with global HK.  Echocardiogram 10/05/12: EF 30-35%, diffuse HK, moderate LAE. Echocardiogram (01/2013): Lateral to septal wall dyssynchrony EF 20-25%, diffuse HK  . Chronic systolic heart failure ([place])   . Automatic implantable cardioverter-defibrillator in situ   . CHF (congestive heart failure) ([place])   . Osteoarthritis     "about all over" (02/22/2013)  . LV lead partial dislodgment 05/30/2013  . Biventricular defibrillator  Medtronic 05/30/2013  . Complication of anesthesia     difficult to wake up and blood pressure drops  . Family history of adverse reaction to anesthesia     difficulty to wake up (daughter)  . Shortness of breath dyspnea     occasional b/c of CHF    Past Surgical History  Procedure Laterality Date  . Tubal ligation    . Foot arthrodesis, modified mcbride Bilateral   . Knee arthroscopy Left   . Knee arthroscopy Right   . Cardiac catheterization  2000  . Lumbar laminectomy  11/03/2006     Bilateral 3, 4, 5 laminectomy, partial L2, decompression of the thecal sac, foraminotomy, posterolateral arthrodesis L3-S1 with autograft   -- SURGEON:  Leeroy Cha, M.D.   . Cardiac defibrillator placement  02/22/2013    with LV lead placement and HV lead testing/notes 02/22/2013  . Appendectomy    .  Excisional hemorrhoidectomy    . Tonsillectomy    . Blepharoplasty Bilateral   . Vaginal hysterectomy    . Left heart catheterization with coronary angiogram N/A 10/04/2012    Procedure: LEFT HEART CATHETERIZATION WITH CORONARY ANGIOGRAM;  Surgeon: Peter M Martinique, MD;  Location: [facility] CATH LAB;  Service: Cardiovascular;  Laterality: N/A;  . Bi-ventricular implantable  cardioverter defibrillator N/A 02/22/2013    Procedure: BI-VENTRICULAR IMPLANTABLE CARDIOVERTER DEFIBRILLATOR  (CRT-D);  Surgeon: Deboraha Sprang, MD;  Location: [facility] CATH LAB;  Service: Cardiovascular;  Laterality: N/A;    No prescriptions prior to admission   Allergies  Allergen Reactions  . Niaspan [Niacin Er]     High doses, causes hot flashes & aching   . Statins Other (See Comments)    "Makes her bones hurt and very sore"  . Benadryl [Diphenhydramine] Other (See Comments)    Makes pt feel jittery     Social History  Substance Use Topics  . Smoking status: Never Smoker   . Smokeless tobacco: Never Used  . Alcohol Use: No    Family History  Problem Relation Age of Onset  . Heart attack Mother     died @ 24.  . Thyroid disease Mother   . Lung cancer Mother   . Hyperlipidemia Mother   . Aneurysm Father     died @ 89.  . Aneurysm Brother     has had an Aneurysm x3  . Colon cancer Brother   . Bone cancer Brother   . Lung cancer Brother   . Stomach cancer Brother      Review of Systems  Constitutional: Positive for malaise/fatigue.  HENT: Positive for hearing loss and tinnitus.   Eyes: Negative.   Respiratory: Positive for shortness of breath.   Cardiovascular:       HTN  Gastrointestinal: Positive for constipation.  Genitourinary: Negative.   Musculoskeletal: Positive for myalgias and joint pain.  Skin: Negative.   Neurological: Negative.   Endo/Heme/Allergies: Negative.        Over active thyroid  Psychiatric/Behavioral: Positive for depression. The patient has insomnia.     Objective:  Physical Exam  Vital signs in last 24 hours:    Labs:   Estimated body mass index is 29.30 kg/(m^2) as calculated from the following:   Height as of 05/03/15: 5' 7.5" (1.715 m).   Weight as of 06/06/15: 86.183 kg (190 lb).   Imaging Review Plain radiographs demonstrate near bone-on-bone arthritis medial compartment bilateral knees.  She also has moderate patellofemoral  arthritis with periarticular osteophyte formation.  Assessment/Plan:  End stage arthritis, left knee   The patient history, physical examination, clinical judgment of the provider and imaging studies are consistent with end stage degenerative joint disease of the left knee(s) and total knee arthroplasty is deemed medically necessary. The treatment options including medical management, injection therapy arthroscopy and arthroplasty were discussed at length. The risks and benefits of total knee arthroplasty were presented and reviewed. The risks due to aseptic loosening, infection, stiffness, patella tracking problems, thromboembolic complications and other imponderables were discussed. The patient acknowledged the explanation, agreed to proceed with the plan and consent was signed. Patient is being admitted for inpatient treatment for surgery, pain control, PT, OT, prophylactic antibiotics, VTE prophylaxis, progressive ambulation and ADL's and discharge planning. The patient is planning to be discharged home with home health services

## 2015-07-13 DIAGNOSIS — M1712 Unilateral primary osteoarthritis, left knee: Secondary | ICD-10-CM | POA: Diagnosis present

## 2015-07-14 MED ORDER — TRANEXAMIC ACID 1000 MG/10ML IV SOLN
15.0000 mg/kg | INTRAVENOUS | Status: DC
  Filled 2015-07-14: qty 12.69

## 2015-07-15 ENCOUNTER — Encounter (HOSPITAL_COMMUNITY): Payer: Self-pay | Admitting: *Deleted

## 2015-07-15 ENCOUNTER — Inpatient Hospital Stay (HOSPITAL_COMMUNITY)
Admission: RE | Admit: 2015-07-15 | Discharge: 2015-07-18 | DRG: 470 | Disposition: A | Discharge status: Home Health | Payer: Medicare HMO | Source: Ambulatory Visit | Attending: Orthopedic Surgery | Admitting: Orthopedic Surgery

## 2015-07-15 ENCOUNTER — Inpatient Hospital Stay (HOSPITAL_COMMUNITY): Payer: Medicare HMO

## 2015-07-15 ENCOUNTER — Inpatient Hospital Stay (HOSPITAL_COMMUNITY): Payer: Medicare HMO | Admitting: Emergency Medicine

## 2015-07-15 ENCOUNTER — Encounter (HOSPITAL_COMMUNITY)
Admission: RE | Disposition: A | Discharge status: Home Health | Payer: Self-pay | Source: Ambulatory Visit | Attending: Orthopedic Surgery

## 2015-07-15 ENCOUNTER — Inpatient Hospital Stay (HOSPITAL_COMMUNITY): Payer: Medicare HMO | Admitting: Anesthesiology

## 2015-07-15 DIAGNOSIS — E7800 Pure hypercholesterolemia, unspecified: Secondary | ICD-10-CM | POA: Diagnosis present

## 2015-07-15 DIAGNOSIS — D62 Acute posthemorrhagic anemia: Secondary | ICD-10-CM | POA: Diagnosis not present

## 2015-07-15 DIAGNOSIS — E785 Hyperlipidemia, unspecified: Secondary | ICD-10-CM | POA: Diagnosis not present

## 2015-07-15 DIAGNOSIS — I447 Left bundle-branch block, unspecified: Secondary | ICD-10-CM | POA: Diagnosis present

## 2015-07-15 DIAGNOSIS — Z8249 Family history of ischemic heart disease and other diseases of the circulatory system: Secondary | ICD-10-CM | POA: Diagnosis not present

## 2015-07-15 DIAGNOSIS — I5042 Chronic combined systolic (congestive) and diastolic (congestive) heart failure: Secondary | ICD-10-CM | POA: Diagnosis not present

## 2015-07-15 DIAGNOSIS — K219 Gastro-esophageal reflux disease without esophagitis: Secondary | ICD-10-CM | POA: Diagnosis present

## 2015-07-15 DIAGNOSIS — I429 Cardiomyopathy, unspecified: Secondary | ICD-10-CM | POA: Diagnosis not present

## 2015-07-15 DIAGNOSIS — G8918 Other acute postprocedural pain: Secondary | ICD-10-CM | POA: Diagnosis not present

## 2015-07-15 DIAGNOSIS — M1712 Unilateral primary osteoarthritis, left knee: Secondary | ICD-10-CM | POA: Diagnosis present

## 2015-07-15 DIAGNOSIS — E860 Dehydration: Secondary | ICD-10-CM | POA: Diagnosis not present

## 2015-07-15 DIAGNOSIS — E039 Hypothyroidism, unspecified: Secondary | ICD-10-CM | POA: Diagnosis not present

## 2015-07-15 DIAGNOSIS — Z01818 Encounter for other preprocedural examination: Secondary | ICD-10-CM

## 2015-07-15 DIAGNOSIS — Z95810 Presence of automatic (implantable) cardiac defibrillator: Secondary | ICD-10-CM | POA: Diagnosis not present

## 2015-07-15 DIAGNOSIS — I5032 Chronic diastolic (congestive) heart failure: Secondary | ICD-10-CM | POA: Insufficient documentation

## 2015-07-15 DIAGNOSIS — M179 Osteoarthritis of knee, unspecified: Secondary | ICD-10-CM | POA: Diagnosis not present

## 2015-07-15 DIAGNOSIS — R55 Syncope and collapse: Secondary | ICD-10-CM

## 2015-07-15 DIAGNOSIS — F329 Major depressive disorder, single episode, unspecified: Secondary | ICD-10-CM | POA: Diagnosis present

## 2015-07-15 DIAGNOSIS — Z888 Allergy status to other drugs, medicaments and biological substances status: Secondary | ICD-10-CM | POA: Diagnosis not present

## 2015-07-15 DIAGNOSIS — R69 Illness, unspecified: Secondary | ICD-10-CM | POA: Diagnosis not present

## 2015-07-15 DIAGNOSIS — I110 Hypertensive heart disease with heart failure: Secondary | ICD-10-CM | POA: Diagnosis present

## 2015-07-15 DIAGNOSIS — M25562 Pain in left knee: Secondary | ICD-10-CM | POA: Diagnosis present

## 2015-07-15 HISTORY — PX: TOTAL KNEE ARTHROPLASTY: SHX125

## 2015-07-15 LAB — URINALYSIS, ROUTINE W REFLEX MICROSCOPIC
BILIRUBIN URINE: NEGATIVE
GLUCOSE, UA: NEGATIVE mg/dL
Hgb urine dipstick: NEGATIVE
KETONES UR: NEGATIVE mg/dL
LEUKOCYTES UA: NEGATIVE
Nitrite: NEGATIVE
PH: 6 (ref 5.0–8.0)
Protein, ur: NEGATIVE mg/dL
Specific Gravity, Urine: 1.01 (ref 1.005–1.030)

## 2015-07-15 LAB — TYPE AND SCREEN
ABO/RH(D): O POS
ANTIBODY SCREEN: NEGATIVE

## 2015-07-15 LAB — CUP PACEART REMOTE DEVICE CHECK
Battery Remaining Longevity: 34 mo
Battery Voltage: 2.96 V
Brady Statistic AP VP Percent: 28.08 %
Brady Statistic RA Percent Paced: 28.59 %
Brady Statistic RV Percent Paced: 95.58 %
Date Time Interrogation Session: 20170503052203
HIGH POWER IMPEDANCE MEASURED VALUE: 70 Ohm
Implantable Lead Implant Date: 20150121
Implantable Lead Implant Date: 20150121
Implantable Lead Location: 753858
Implantable Lead Model: 4298
Lead Channel Impedance Value: 323 Ohm
Lead Channel Impedance Value: 342 Ohm
Lead Channel Impedance Value: 437 Ohm
Lead Channel Impedance Value: 456 Ohm
Lead Channel Impedance Value: 513 Ohm
Lead Channel Impedance Value: 532 Ohm
Lead Channel Impedance Value: 646 Ohm
Lead Channel Pacing Threshold Amplitude: 0.5 V
Lead Channel Pacing Threshold Pulse Width: 0.4 ms
Lead Channel Sensing Intrinsic Amplitude: 14.5 mV
Lead Channel Sensing Intrinsic Amplitude: 2.5 mV
Lead Channel Setting Pacing Amplitude: 2.75 V
Lead Channel Setting Pacing Pulse Width: 0.4 ms
MDC IDC LEAD IMPLANT DT: 20150121
MDC IDC LEAD LOCATION: 753859
MDC IDC LEAD LOCATION: 753860
MDC IDC MSMT LEADCHNL LV IMPEDANCE VALUE: 304 Ohm
MDC IDC MSMT LEADCHNL LV IMPEDANCE VALUE: 380 Ohm
MDC IDC MSMT LEADCHNL LV IMPEDANCE VALUE: 532 Ohm
MDC IDC MSMT LEADCHNL LV IMPEDANCE VALUE: 532 Ohm
MDC IDC MSMT LEADCHNL LV IMPEDANCE VALUE: 589 Ohm
MDC IDC MSMT LEADCHNL LV IMPEDANCE VALUE: 589 Ohm
MDC IDC MSMT LEADCHNL LV PACING THRESHOLD AMPLITUDE: 1.625 V
MDC IDC MSMT LEADCHNL LV PACING THRESHOLD PULSEWIDTH: 0.7 ms
MDC IDC MSMT LEADCHNL RA PACING THRESHOLD AMPLITUDE: 0.625 V
MDC IDC MSMT LEADCHNL RA PACING THRESHOLD PULSEWIDTH: 0.4 ms
MDC IDC MSMT LEADCHNL RA SENSING INTR AMPL: 2.5 mV
MDC IDC MSMT LEADCHNL RV SENSING INTR AMPL: 14.5 mV
MDC IDC SET LEADCHNL LV PACING PULSEWIDTH: 0.7 ms
MDC IDC SET LEADCHNL RA PACING AMPLITUDE: 1.5 V
MDC IDC SET LEADCHNL RV PACING AMPLITUDE: 2 V
MDC IDC SET LEADCHNL RV SENSING SENSITIVITY: 0.3 mV
MDC IDC STAT BRADY AP VS PERCENT: 0.51 %
MDC IDC STAT BRADY AS VP PERCENT: 69.83 %
MDC IDC STAT BRADY AS VS PERCENT: 1.58 %

## 2015-07-15 LAB — PROTIME-INR
INR: 0.97 (ref 0.00–1.49)
PROTHROMBIN TIME: 13.1 s (ref 11.6–15.2)

## 2015-07-15 LAB — ABO/RH: ABO/RH(D): O POS

## 2015-07-15 LAB — APTT: aPTT: 33 seconds (ref 24–37)

## 2015-07-15 SURGERY — ARTHROPLASTY, KNEE, TOTAL
Anesthesia: General | Laterality: Left

## 2015-07-15 MED ORDER — PHENYLEPHRINE HCL 10 MG/ML IJ SOLN
INTRAMUSCULAR | Status: DC | PRN
  Administered 2015-07-15: 80 ug via INTRAVENOUS
  Administered 2015-07-15 (×2): 40 ug via INTRAVENOUS
  Administered 2015-07-15: 120 ug via INTRAVENOUS

## 2015-07-15 MED ORDER — MENTHOL 3 MG MT LOZG
1.0000 | LOZENGE | OROMUCOSAL | Status: DC | PRN
Start: 2015-07-15 — End: 2015-07-18

## 2015-07-15 MED ORDER — FENTANYL CITRATE (PF) 100 MCG/2ML IJ SOLN
INTRAMUSCULAR | Status: AC
  Filled 2015-07-15: qty 2

## 2015-07-15 MED ORDER — FENTANYL CITRATE (PF) 250 MCG/5ML IJ SOLN
INTRAMUSCULAR | Status: DC | PRN
  Administered 2015-07-15: 25 ug via INTRAVENOUS
  Administered 2015-07-15: 50 ug via INTRAVENOUS
  Administered 2015-07-15: 25 ug via INTRAVENOUS
  Administered 2015-07-15 (×3): 50 ug via INTRAVENOUS

## 2015-07-15 MED ORDER — TIZANIDINE HCL 2 MG PO TABS: 2.0000 mg | ORAL_TABLET | Freq: Four times a day (QID) | ORAL | Status: DC | PRN

## 2015-07-15 MED ORDER — ASPIRIN EC 325 MG PO TBEC
325.0000 mg | DELAYED_RELEASE_TABLET | Freq: Every day | ORAL | Status: DC
  Administered 2015-07-16 – 2015-07-18 (×3): 325 mg via ORAL
  Filled 2015-07-15 (×3): qty 1

## 2015-07-15 MED ORDER — MAGNESIUM OXIDE 400 (241.3 MG) MG PO TABS
400.0000 mg | ORAL_TABLET | Freq: Two times a day (BID) | ORAL | Status: DC
  Administered 2015-07-15 – 2015-07-18 (×6): 400 mg via ORAL
  Filled 2015-07-15 (×6): qty 1

## 2015-07-15 MED ORDER — KCL IN DEXTROSE-NACL 20-5-0.45 MEQ/L-%-% IV SOLN
INTRAVENOUS | Status: DC
  Administered 2015-07-15: 125 mL/h via INTRAVENOUS
  Filled 2015-07-15 (×2): qty 1000

## 2015-07-15 MED ORDER — METOCLOPRAMIDE HCL 5 MG PO TABS: 5.0000 mg | ORAL_TABLET | Freq: Three times a day (TID) | ORAL | Status: DC | PRN

## 2015-07-15 MED ORDER — BUPIVACAINE HCL (PF) 0.25 % IJ SOLN
INTRAMUSCULAR | Status: AC
  Filled 2015-07-15: qty 30

## 2015-07-15 MED ORDER — TRANEXAMIC ACID 1000 MG/10ML IV SOLN
1000.0000 mg | Freq: Once | INTRAVENOUS | Status: AC
  Administered 2015-07-15: 1000 mg via INTRAVENOUS
  Filled 2015-07-15: qty 10

## 2015-07-15 MED ORDER — CEFUROXIME SODIUM 1.5 G IJ SOLR
INTRAMUSCULAR | Status: AC
  Filled 2015-07-15: qty 1.5

## 2015-07-15 MED ORDER — SENNOSIDES-DOCUSATE SODIUM 8.6-50 MG PO TABS: 1.0000 | ORAL_TABLET | Freq: Every evening | ORAL | Status: DC | PRN

## 2015-07-15 MED ORDER — MIDAZOLAM HCL 2 MG/2ML IJ SOLN
1.0000 mg | Freq: Once | INTRAMUSCULAR | Status: AC
  Administered 2015-07-15: 1 mg via INTRAVENOUS

## 2015-07-15 MED ORDER — KETOROLAC TROMETHAMINE 30 MG/ML IJ SOLN
30.0000 mg | Freq: Once | INTRAMUSCULAR | Status: AC
  Administered 2015-07-15: 30 mg via INTRAVENOUS

## 2015-07-15 MED ORDER — MIDAZOLAM HCL 2 MG/2ML IJ SOLN
INTRAMUSCULAR | Status: AC
  Filled 2015-07-15: qty 2

## 2015-07-15 MED ORDER — DIPHENHYDRAMINE HCL 12.5 MG/5ML PO ELIX: 12.5000 mg | ORAL_SOLUTION | ORAL | Status: DC | PRN

## 2015-07-15 MED ORDER — LACTATED RINGERS IV SOLN
INTRAVENOUS | Status: DC
  Administered 2015-07-15 (×3): via INTRAVENOUS

## 2015-07-15 MED ORDER — BUPIVACAINE-EPINEPHRINE (PF) 0.25% -1:200000 IJ SOLN
INTRAMUSCULAR | Status: AC
  Filled 2015-07-15: qty 30

## 2015-07-15 MED ORDER — ONDANSETRON HCL 4 MG PO TABS
4.0000 mg | ORAL_TABLET | Freq: Four times a day (QID) | ORAL | Status: DC | PRN
  Administered 2015-07-16: 4 mg via ORAL
  Filled 2015-07-15: qty 1

## 2015-07-15 MED ORDER — SODIUM CHLORIDE 0.9 % IJ SOLN
INTRAMUSCULAR | Status: DC | PRN
  Administered 2015-07-15: 50 mL

## 2015-07-15 MED ORDER — OXYCODONE HCL 5 MG PO TABS
5.0000 mg | ORAL_TABLET | ORAL | Status: DC | PRN
  Administered 2015-07-15 – 2015-07-17 (×7): 10 mg via ORAL
  Administered 2015-07-17: 5 mg via ORAL
  Filled 2015-07-15 (×3): qty 2
  Filled 2015-07-15 (×2): qty 1
  Filled 2015-07-15 (×3): qty 2

## 2015-07-15 MED ORDER — ACETAMINOPHEN 650 MG RE SUPP: 650.0000 mg | Freq: Four times a day (QID) | RECTAL | Status: DC | PRN

## 2015-07-15 MED ORDER — HYDROMORPHONE HCL 1 MG/ML IJ SOLN
0.5000 mg | INTRAMUSCULAR | Status: DC | PRN
  Administered 2015-07-16: 0.5 mg via INTRAVENOUS
  Filled 2015-07-15: qty 1

## 2015-07-15 MED ORDER — BUPIVACAINE-EPINEPHRINE 0.25% -1:200000 IJ SOLN
INTRAMUSCULAR | Status: DC | PRN
  Administered 2015-07-15: 50 mL

## 2015-07-15 MED ORDER — METHOCARBAMOL 500 MG PO TABS
ORAL_TABLET | ORAL | Status: AC
  Filled 2015-07-15: qty 1

## 2015-07-15 MED ORDER — TRANEXAMIC ACID 1000 MG/10ML IV SOLN
2000.0000 mg | Freq: Once | INTRAVENOUS | Status: AC
  Administered 2015-07-15: 2000 mg via TOPICAL
  Filled 2015-07-15: qty 20

## 2015-07-15 MED ORDER — DOCUSATE SODIUM 100 MG PO CAPS
100.0000 mg | ORAL_CAPSULE | Freq: Two times a day (BID) | ORAL | Status: DC
  Administered 2015-07-15 – 2015-07-18 (×6): 100 mg via ORAL
  Filled 2015-07-15 (×6): qty 1

## 2015-07-15 MED ORDER — ONDANSETRON HCL 4 MG/2ML IJ SOLN
INTRAMUSCULAR | Status: AC
  Filled 2015-07-15: qty 2

## 2015-07-15 MED ORDER — DEXTROSE-NACL 5-0.45 % IV SOLN
INTRAVENOUS | Status: DC
Start: 2015-07-15 — End: 2015-07-15

## 2015-07-15 MED ORDER — CITALOPRAM HYDROBROMIDE 20 MG PO TABS
20.0000 mg | ORAL_TABLET | Freq: Every day | ORAL | Status: DC
  Administered 2015-07-16 – 2015-07-18 (×3): 20 mg via ORAL
  Filled 2015-07-15 (×3): qty 1

## 2015-07-15 MED ORDER — ONDANSETRON HCL 4 MG/2ML IJ SOLN: 4.0000 mg | Freq: Four times a day (QID) | INTRAMUSCULAR | Status: DC | PRN

## 2015-07-15 MED ORDER — CEFUROXIME SODIUM 1.5 G IJ SOLR
INTRAMUSCULAR | Status: DC | PRN
  Administered 2015-07-15: 1.5 g

## 2015-07-15 MED ORDER — ZOLPIDEM TARTRATE 5 MG PO TABS
5.0000 mg | ORAL_TABLET | Freq: Every day | ORAL | Status: DC
  Filled 2015-07-15: qty 1

## 2015-07-15 MED ORDER — ALUM & MAG HYDROXIDE-SIMETH 200-200-20 MG/5ML PO SUSP: 30.0000 mL | ORAL | Status: DC | PRN

## 2015-07-15 MED ORDER — FENTANYL CITRATE (PF) 100 MCG/2ML IJ SOLN
50.0000 ug | Freq: Once | INTRAMUSCULAR | Status: AC
  Administered 2015-07-15: 50 ug via INTRAVENOUS

## 2015-07-15 MED ORDER — SODIUM CHLORIDE 0.9 % IR SOLN
Status: DC | PRN
  Administered 2015-07-15: 1000 mL

## 2015-07-15 MED ORDER — ROPIVACAINE HCL 5 MG/ML IJ SOLN
INTRAMUSCULAR | Status: DC | PRN
  Administered 2015-07-15: 20 mL via PERINEURAL

## 2015-07-15 MED ORDER — METHOCARBAMOL 500 MG PO TABS
500.0000 mg | ORAL_TABLET | Freq: Four times a day (QID) | ORAL | Status: DC | PRN
  Administered 2015-07-15 – 2015-07-18 (×4): 500 mg via ORAL
  Filled 2015-07-15 (×3): qty 1

## 2015-07-15 MED ORDER — ASPIRIN EC 325 MG PO TBEC: 325.0000 mg | DELAYED_RELEASE_TABLET | Freq: Two times a day (BID) | ORAL | Status: DC

## 2015-07-15 MED ORDER — DEXTROSE 5 % IV SOLN
500.0000 mg | Freq: Four times a day (QID) | INTRAVENOUS | Status: DC | PRN
  Filled 2015-07-15: qty 5

## 2015-07-15 MED ORDER — LIDOCAINE HCL (CARDIAC) 20 MG/ML IV SOLN
INTRAVENOUS | Status: DC | PRN
  Administered 2015-07-15: 100 mg via INTRAVENOUS

## 2015-07-15 MED ORDER — SACUBITRIL-VALSARTAN 24-26 MG PO TABS
1.0000 | ORAL_TABLET | Freq: Two times a day (BID) | ORAL | Status: DC
  Administered 2015-07-15 – 2015-07-17 (×5): 1 via ORAL
  Filled 2015-07-15 (×7): qty 1

## 2015-07-15 MED ORDER — TRANEXAMIC ACID 1000 MG/10ML IV SOLN
1000.0000 mg | INTRAVENOUS | Status: AC
  Administered 2015-07-15: 1000 mg via INTRAVENOUS
  Filled 2015-07-15: qty 10

## 2015-07-15 MED ORDER — PHENOL 1.4 % MT LIQD
1.0000 | OROMUCOSAL | Status: DC | PRN
Start: 2015-07-15 — End: 2015-07-18

## 2015-07-15 MED ORDER — ACETAMINOPHEN 325 MG PO TABS
650.0000 mg | ORAL_TABLET | Freq: Four times a day (QID) | ORAL | Status: DC | PRN
  Administered 2015-07-17: 650 mg via ORAL
  Filled 2015-07-15: qty 2

## 2015-07-15 MED ORDER — PANTOPRAZOLE SODIUM 40 MG PO TBEC
40.0000 mg | DELAYED_RELEASE_TABLET | Freq: Every day | ORAL | Status: DC
  Administered 2015-07-16 – 2015-07-18 (×3): 40 mg via ORAL
  Filled 2015-07-15 (×3): qty 1

## 2015-07-15 MED ORDER — BISACODYL 5 MG PO TBEC: 5.0000 mg | DELAYED_RELEASE_TABLET | Freq: Every day | ORAL | Status: DC | PRN

## 2015-07-15 MED ORDER — CARVEDILOL 6.25 MG PO TABS
6.2500 mg | ORAL_TABLET | Freq: Two times a day (BID) | ORAL | Status: DC
Start: 2015-07-15 — End: 2015-07-18
  Administered 2015-07-15 – 2015-07-18 (×5): 6.25 mg via ORAL
  Filled 2015-07-15 (×6): qty 1

## 2015-07-15 MED ORDER — KETOROLAC TROMETHAMINE 30 MG/ML IJ SOLN
INTRAMUSCULAR | Status: AC
  Filled 2015-07-15: qty 1

## 2015-07-15 MED ORDER — METOCLOPRAMIDE HCL 5 MG/ML IJ SOLN: 5.0000 mg | Freq: Three times a day (TID) | INTRAMUSCULAR | Status: DC | PRN

## 2015-07-15 MED ORDER — PROPOFOL 10 MG/ML IV BOLUS
INTRAVENOUS | Status: AC
  Filled 2015-07-15: qty 20

## 2015-07-15 MED ORDER — CEFAZOLIN SODIUM-DEXTROSE 2-3 GM-% IV SOLR
INTRAVENOUS | Status: DC | PRN
  Administered 2015-07-15: 2 g via INTRAVENOUS

## 2015-07-15 MED ORDER — FENTANYL CITRATE (PF) 250 MCG/5ML IJ SOLN
INTRAMUSCULAR | Status: AC
  Filled 2015-07-15: qty 5

## 2015-07-15 MED ORDER — BUPIVACAINE LIPOSOME 1.3 % IJ SUSP
20.0000 mL | Freq: Once | INTRAMUSCULAR | Status: AC
  Administered 2015-07-15: 20 mL
  Filled 2015-07-15: qty 20

## 2015-07-15 MED ORDER — LEVOTHYROXINE SODIUM 50 MCG PO TABS
50.0000 ug | ORAL_TABLET | Freq: Every day | ORAL | Status: DC
  Administered 2015-07-16 – 2015-07-18 (×3): 50 ug via ORAL
  Filled 2015-07-15 (×3): qty 1

## 2015-07-15 MED ORDER — ONDANSETRON HCL 4 MG/2ML IJ SOLN: 4.0000 mg | Freq: Once | INTRAMUSCULAR | Status: DC | PRN

## 2015-07-15 MED ORDER — PROPOFOL 10 MG/ML IV BOLUS
INTRAVENOUS | Status: DC | PRN
  Administered 2015-07-15: 50 mg via INTRAVENOUS
  Administered 2015-07-15: 150 mg via INTRAVENOUS

## 2015-07-15 MED ORDER — FLEET ENEMA 7-19 GM/118ML RE ENEM: 1.0000 | ENEMA | Freq: Once | RECTAL | Status: DC | PRN

## 2015-07-15 MED ORDER — MEPERIDINE HCL 25 MG/ML IJ SOLN: 6.2500 mg | INTRAMUSCULAR | Status: DC | PRN

## 2015-07-15 MED ORDER — OXYCODONE HCL 5 MG PO TABS
ORAL_TABLET | ORAL | Status: AC
  Filled 2015-07-15: qty 2

## 2015-07-15 MED ORDER — EPHEDRINE SULFATE 50 MG/ML IJ SOLN
INTRAMUSCULAR | Status: DC | PRN
  Administered 2015-07-15 (×5): 10 mg via INTRAVENOUS

## 2015-07-15 MED ORDER — FUROSEMIDE 20 MG PO TABS
20.0000 mg | ORAL_TABLET | ORAL | Status: DC
  Filled 2015-07-15 (×2): qty 1

## 2015-07-15 MED ORDER — CHLORHEXIDINE GLUCONATE 4 % EX LIQD: 60.0000 mL | Freq: Once | CUTANEOUS | Status: DC

## 2015-07-15 MED ORDER — MAGNESIUM OXIDE -MG SUPPLEMENT 200 MG PO TABS: ORAL_TABLET | Freq: Two times a day (BID) | ORAL | Status: DC

## 2015-07-15 MED ORDER — OXYCODONE-ACETAMINOPHEN 5-325 MG PO TABS: 1.0000 | ORAL_TABLET | ORAL | Status: DC | PRN

## 2015-07-15 MED ORDER — GABAPENTIN 300 MG PO CAPS
300.0000 mg | ORAL_CAPSULE | Freq: Two times a day (BID) | ORAL | Status: DC
  Administered 2015-07-15 – 2015-07-18 (×6): 300 mg via ORAL
  Filled 2015-07-15 (×6): qty 1

## 2015-07-15 MED ORDER — ONDANSETRON HCL 4 MG/2ML IJ SOLN
INTRAMUSCULAR | Status: DC | PRN
  Administered 2015-07-15: 4 mg via INTRAVENOUS

## 2015-07-15 MED ORDER — FENTANYL CITRATE (PF) 100 MCG/2ML IJ SOLN
25.0000 ug | INTRAMUSCULAR | Status: DC | PRN
  Administered 2015-07-15: 25 ug via INTRAVENOUS
  Administered 2015-07-15: 50 ug via INTRAVENOUS
  Administered 2015-07-15 (×3): 25 ug via INTRAVENOUS

## 2015-07-15 SURGICAL SUPPLY — 59 items
BANDAGE ESMARK 6X9 LF (GAUZE/BANDAGES/DRESSINGS) ×1 IMPLANT
BLADE SAG 18X100X1.27 (BLADE) ×2 IMPLANT
BLADE SAW SGTL 13X75X1.27 (BLADE) ×2 IMPLANT
BLADE SURG ROTATE 9660 (MISCELLANEOUS) IMPLANT
BNDG ELASTIC 6X10 VLCR STRL LF (GAUZE/BANDAGES/DRESSINGS) ×2 IMPLANT
BNDG ESMARK 6X9 LF (GAUZE/BANDAGES/DRESSINGS) ×2
BOWL SMART MIX CTS (DISPOSABLE) ×2 IMPLANT
CAPT KNEE TOTAL 3 ATTUNE ×2 IMPLANT
CEMENT HV SMART SET (Cement) ×4 IMPLANT
COVER SURGICAL LIGHT HANDLE (MISCELLANEOUS) ×2 IMPLANT
CUFF TOURNIQUET SINGLE 34IN LL (TOURNIQUET CUFF) ×2 IMPLANT
CUFF TOURNIQUET SINGLE 44IN (TOURNIQUET CUFF) IMPLANT
DRAPE EXTREMITY T 121X128X90 (DRAPE) ×2 IMPLANT
DRAPE U-SHAPE 47X51 STRL (DRAPES) ×2 IMPLANT
DRSG AQUACEL AG ADV 3.5X10 (GAUZE/BANDAGES/DRESSINGS) ×2 IMPLANT
DURAPREP 26ML APPLICATOR (WOUND CARE) ×2 IMPLANT
ELECT REM PT RETURN 9FT ADLT (ELECTROSURGICAL) ×2
ELECTRODE REM PT RTRN 9FT ADLT (ELECTROSURGICAL) ×1 IMPLANT
EVACUATOR 1/8 PVC DRAIN (DRAIN) IMPLANT
GLOVE BIO SURGEON STRL SZ7 (GLOVE) ×2 IMPLANT
GLOVE BIO SURGEON STRL SZ7.5 (GLOVE) ×2 IMPLANT
GLOVE BIO SURGEON STRL SZ8.5 (GLOVE) ×2 IMPLANT
GLOVE BIOGEL M 6.5 STRL (GLOVE) ×2 IMPLANT
GLOVE BIOGEL PI IND STRL 7.0 (GLOVE) ×1 IMPLANT
GLOVE BIOGEL PI IND STRL 7.5 (GLOVE) ×1 IMPLANT
GLOVE BIOGEL PI IND STRL 8 (GLOVE) ×1 IMPLANT
GLOVE BIOGEL PI IND STRL 9 (GLOVE) ×1 IMPLANT
GLOVE BIOGEL PI INDICATOR 7.0 (GLOVE) ×1
GLOVE BIOGEL PI INDICATOR 7.5 (GLOVE) ×1
GLOVE BIOGEL PI INDICATOR 8 (GLOVE) ×1
GLOVE BIOGEL PI INDICATOR 9 (GLOVE) ×1
GOWN STRL REUS W/ TWL LRG LVL3 (GOWN DISPOSABLE) ×1 IMPLANT
GOWN STRL REUS W/ TWL XL LVL3 (GOWN DISPOSABLE) ×2 IMPLANT
GOWN STRL REUS W/TWL LRG LVL3 (GOWN DISPOSABLE) ×1
GOWN STRL REUS W/TWL XL LVL3 (GOWN DISPOSABLE) ×2
HANDPIECE INTERPULSE COAX TIP (DISPOSABLE) ×1
HOOD PEEL AWAY FACE SHEILD DIS (HOOD) ×4 IMPLANT
KIT BASIN OR (CUSTOM PROCEDURE TRAY) ×2 IMPLANT
KIT ROOM TURNOVER OR (KITS) ×2 IMPLANT
MANIFOLD NEPTUNE II (INSTRUMENTS) ×2 IMPLANT
NEEDLE SPNL 18GX3.5 QUINCKE PK (NEEDLE) IMPLANT
NS IRRIG 1000ML POUR BTL (IV SOLUTION) ×2 IMPLANT
PACK TOTAL JOINT (CUSTOM PROCEDURE TRAY) ×2 IMPLANT
PAD ARMBOARD 7.5X6 YLW CONV (MISCELLANEOUS) ×4 IMPLANT
SET HNDPC FAN SPRY TIP SCT (DISPOSABLE) ×1 IMPLANT
SUT VIC AB 0 CT1 27 (SUTURE) ×1
SUT VIC AB 0 CT1 27XBRD ANBCTR (SUTURE) ×1 IMPLANT
SUT VIC AB 1 CTX 36 (SUTURE) ×1
SUT VIC AB 1 CTX36XBRD ANBCTR (SUTURE) ×1 IMPLANT
SUT VIC AB 2-0 CT1 27 (SUTURE)
SUT VIC AB 2-0 CT1 TAPERPNT 27 (SUTURE) IMPLANT
SUT VIC AB 3-0 CT1 27 (SUTURE) ×1
SUT VIC AB 3-0 CT1 TAPERPNT 27 (SUTURE) ×1 IMPLANT
SYR 20CC LL (SYRINGE) ×4 IMPLANT
SYR 50ML LL SCALE MARK (SYRINGE) IMPLANT
TOWEL OR 17X24 6PK STRL BLUE (TOWEL DISPOSABLE) ×2 IMPLANT
TOWEL OR 17X26 10 PK STRL BLUE (TOWEL DISPOSABLE) ×2 IMPLANT
TRAY CATH 16FR W/PLASTIC CATH (SET/KITS/TRAYS/PACK) IMPLANT
WATER STERILE IRR 1000ML POUR (IV SOLUTION) IMPLANT

## 2015-07-15 NOTE — Anesthesia Preprocedure Evaluation (Addendum)
Anesthesia Evaluation  Patient identified by MRN, date of birth, ID band Patient awake    Reviewed: Allergy & Precautions, H&P , NPO status , Patient's Chart, lab work & pertinent test results  Airway Mallampati: I  TM Distance: >3 FB Neck ROM: full    Dental no notable dental hx. (+) Dental Advisory Given   Pulmonary neg pulmonary ROS,    Pulmonary exam normal        Cardiovascular hypertension, Normal cardiovascular exam     Neuro/Psych negative neurological ROS     GI/Hepatic negative GI ROS, Neg liver ROS,   Endo/Other    Renal/GU negative Renal ROS     Musculoskeletal   Abdominal Normal abdominal exam  (+)   Peds  Hematology negative hematology ROS (+)   Anesthesia Other Findings   Reproductive/Obstetrics (+) Pregnancy                           Anesthesia Physical Anesthesia Plan  ASA: III  Anesthesia Plan: General   Post-op Pain Management: GA combined w/ Regional for post-op pain   Induction:   Airway Management Planned: LMA  Additional Equipment:   Intra-op Plan:   Post-operative Plan:   Informed Consent: I have reviewed the patients History and Physical, chart, labs and discussed the procedure including the risks, benefits and alternatives for the proposed anesthesia with the patient or authorized representative who has indicated his/her understanding and acceptance.     Plan Discussed with: CRNA and Surgeon  Anesthesia Plan Comments:         Anesthesia Quick Evaluation

## 2015-07-15 NOTE — Progress Notes (Signed)
PA Mitzi Hansen with Dr. Damita Dunnings service notified of bleeding from left knee surgical site. Bright red blood visible on ace wrap and ice pack.  Pt rec'd from PACU with bloody drainage on dressing. Instructions rec'd to reinforce dressing with ABD and ace and observe. Information to be passed on to next receiving nurse.

## 2015-07-15 NOTE — Interval H&P Note (Signed)
History and Physical Interval Note:  07/15/2015 11:44 AM  [name]  has presented today for surgery, with the diagnosis of LEFT KNEE OSTEOARTHRITIS  The various methods of treatment have been discussed with the patient and family. After consideration of risks, benefits and other options for treatment, the patient has consented to  Procedure(s): LEFT TOTAL KNEE ARTHROPLASTY (Left) as a surgical intervention .  The patient's history has been reviewed, patient examined, no change in status, stable for surgery.  I have reviewed the patient's chart and labs.  Questions were answered to the patient's satisfaction.     Kerin Salen

## 2015-07-15 NOTE — Progress Notes (Signed)
Orthopedic Tech Progress Note Patient Details:  Heather Smith Jan 10, 1933 [id number]  Patient ID: [name], female   DOB: [birth year], 82 y.o.   MRN: [id number] Pt already on cpm  Karolee Stamps 07/15/2015, 8:08 PM

## 2015-07-15 NOTE — Op Note (Signed)
PATIENT ID:      Heather Smith  MRN:     [id number] DOB/AGE:    1932-05-27 / 82 y.o.       OPERATIVE REPORT    DATE OF PROCEDURE:  07/15/2015       PREOPERATIVE DIAGNOSIS:   LEFT KNEE OSTEOARTHRITIS      Estimated body mass index is 28.75 kg/(m^2) as calculated from the following:   Height as of this encounter: 5' 7.5" (1.715 m).   Weight as of this encounter: 84.567 kg (186 lb 7 oz).                                                        POSTOPERATIVE DIAGNOSIS:   LEFT KNEE OSTEOARTHRITIS                                                                      PROCEDURE:  Procedure(s): LEFT TOTAL KNEE ARTHROPLASTY Using DepuyAttune RP implants #7L Femur, #8Tibia, 5 mm Attune RP bearing, 38 Patella     SURGEON: Tawnia Schirm J    ASSISTANT:   Eric K. Barton Dubois   (Present and scrubbed throughout the case, critical for assistance with exposure, retraction, instrumentation, and closure.)         ANESTHESIA: GLMA, 20cc Exparel, 20cc 0.5% Marcaine  EBL: 300  FLUID REPLACEMENT: 1600 crystalloid  TOURNIQUET TIME: 37min  Drains: None  Tranexamic Acid: 1gm iv, 2gm topical   COMPLICATIONS:  None         INDICATIONS FOR PROCEDURE: The patient has  LEFT KNEE OSTEOARTHRITIS, Var deformities, XR shows bone on bone arthritis, lateral subluxation of tibia. Patient has failed all conservative measures including anti-inflammatory medicines, narcotics, attempts at  exercise and weight loss, cortisone injections and viscosupplementation.  Risks and benefits of surgery have been discussed, questions answered.   DESCRIPTION OF PROCEDURE: The patient identified by armband, received  IV antibiotics, in the holding area at [facility]. Patient taken to the operating room, appropriate anesthetic  monitors were attached, and GLMA anesthesia was  induced. Tourniquet  applied high to the operative thigh. Lateral post and foot positioner  applied to the table, the lower extremity was then prepped and  draped  in usual sterile fashion from the toes to the tourniquet. Time-out procedure was performed. We began the operation, with the knee flexed 120 degrees, by making the anterior midline incision starting at handbreadth above the patella going over the patella 1 cm medial to and 4 cm distal to the tibial tubercle. Small bleeders in the skin and the  subcutaneous tissue identified and cauterized. Transverse retinaculum was incised and reflected medially and a medial parapatellar arthrotomy was accomplished. the patella was everted and theprepatellar fat pad resected. The superficial medial collateral  ligament was then elevated from anterior to posterior along the proximal  flare of the tibia and anterior half of the menisci resected. The knee was hyperflexed exposing bone on bone arthritis. Peripheral and notch osteophytes as well as the cruciate ligaments were then resected. We continued to  work our way around posteriorly  along the proximal tibia, and externally  rotated the tibia subluxing it out from underneath the femur. A McHale  retractor was placed through the notch and a lateral Hohmann retractor  placed, and we then drilled through the proximal tibia in line with the  axis of the tibia followed by an intramedullary guide rod and 2-degree  posterior slope cutting guide. The tibial cutting guide, 3 degree posterior sloped, was pinned into place allowing resection of 4 mm of bone medially and 11 mm of bone laterally. Satisfied with the tibial resection, we then  entered the distal femur 2 mm anterior to the PCL origin with the  intramedullary guide rod and applied the distal femoral cutting guide  set at 9 mm, with 5 degrees of valgus. This was pinned along the  epicondylar axis. At this point, the distal femoral cut was accomplished without difficulty. We then sized for a #7L femoral component and pinned the guide in 3 degrees of external rotation. The chamfer cutting guide was pinned into  place. The anterior, posterior, and chamfer cuts were accomplished without difficulty followed by  the Attune RP box cutting guide and the box cut. We also removed posterior osteophytes from the posterior femoral condyles. At this  time, the knee was brought into full extension. We checked our  extension and flexion gaps and found them symmetric for a 5 mm bearing. Distracting in extension with a lamina spreader, the posterior horns of the menisci were removed, and Exparel, diluted to 60 cc, with 20cc NS, and 20cc 0.5% Marcaine,was injected into the capsule and synovium of the knee. The posterior patella cut was accomplished with the 9.5 mm Attune cutting guide, sized for a 19mm dome, and the fixation pegs drilled.The knee  was then once again hyperflexed exposing the proximal tibia. We sized for a # 8 tibial base plate, applied the smokestack and the conical reamer followed by the the Delta fin keel punch. We then hammered into place the Attune RP trial femoral component, drilled the lugs, inserted a  5 mm trial bearing, trial patellar button, and took the knee through range of motion from 0-130 degrees. No thumb pressure was required for patellar Tracking. At this point, the limb was wrapped with an Esmarch bandage and the tourniquet inflated to 350 mmHg. All trial components were removed, mating surfaces irrigated with pulse lavage, and dried with suction and sponges. A double batch of DePuy HV cement with 1500 mg of Zinacef was mixed and applied to all bony metallic mating surfaces except for the posterior condyles of the femur itself. In order, we  hammered into place the tibial tray and removed excess cement, the femoral component and removed excess cement. The final Attune RP bearing  was inserted, and the knee brought to full extension with compression.  The patellar button was clamped into place, and excess cement  removed. While the cement cured the wound was irrigated out with normal saline  solution pulse lavage. Ligament stability and patellar tracking were checked and found to be excellent. The parapatellar arthrotomy was closed with  running #1 Vicryl suture. The subcutaneous tissue with 0 and 2-0 undyed  Vicryl suture, and the skin with running 3-0 SQ vicryl. A dressing of Xeroform,  4 x 4, dressing sponges, Webril, and Ace wrap applied. The patient  awakened, and taken to recovery room without difficulty.   Kerin Salen 07/15/2015, 3:23 PM

## 2015-07-15 NOTE — Discharge Instructions (Signed)

## 2015-07-15 NOTE — Transfer of Care (Signed)
Immediate Anesthesia Transfer of Care Note  Patient: Heather Smith  Procedure(s) Performed: Procedure(s): LEFT TOTAL KNEE ARTHROPLASTY (Left)  Patient Location: PACU  Anesthesia Type:General  Level of Consciousness: awake, alert , oriented and patient cooperative  Airway & Oxygen Therapy: Patient Spontanous Breathing  Post-op Assessment: Report given to RN, Post -op Vital signs reviewed and stable and Patient moving all extremities X 4  Post vital signs: Reviewed and stable  Last Vitals:  Filed Vitals:   07/15/15 1335 07/15/15 1340  BP: 125/62 139/59  Pulse: 59 59  Temp:    Resp: 11 9    Last Pain: There were no vitals filed for this visit.       Complications: No apparent anesthesia complications

## 2015-07-15 NOTE — Progress Notes (Signed)
Orthopedic Tech Progress Note Patient Details:  Heather Smith Nov 23, 1932 [id number]  CPM Left Knee CPM Left Knee: On Left Knee Flexion (Degrees): 40 Left Knee Extension (Degrees): 10 Additional Comments: Trapeze bar and foot roll   [name] 07/15/2015, 4:33 PM

## 2015-07-15 NOTE — Anesthesia Procedure Notes (Addendum)
Anesthesia Regional Block:  Adductor canal block  Pre-Anesthetic Checklist: ,, timeout performed, Correct Patient, Correct Site, Correct Laterality, Correct Procedure, Correct Position, site marked, Risks and benefits discussed,  Surgical consent,  Pre-op evaluation,  At surgeon's request and post-op pain management  Laterality: Left  Prep: chloraprep       Needles:  Injection technique: Single-shot  Needle Type: Echogenic Needle     Needle Length: 9cm 9 cm Needle Gauge: 21 and 21 G    Additional Needles:  Procedures: ultrasound guided (picture in chart) Adductor canal block Narrative:  Start time: 07/15/2015 1:08 PM End time: 07/15/2015 1:15 PM Injection made incrementally with aspirations every 5 mL.  Performed by: Personally  Anesthesiologist: Suzette Battiest   Procedure Name: LMA Insertion Date/Time: 07/15/2015 2:15 PM Performed by: Cynda Familia Pre-anesthesia Checklist: Patient identified, Emergency Drugs available, Suction available and Patient being monitored Patient Re-evaluated:Patient Re-evaluated prior to inductionOxygen Delivery Method: Circle System Utilized Preoxygenation: Pre-oxygenation with 100% oxygen Intubation Type: IV induction Ventilation: Mask ventilation without difficulty LMA: LMA inserted LMA Size: 4.0 Number of attempts: 1 Placement Confirmation: positive ETCO2 Tube secured with: Tape Dental Injury: Teeth and Oropharynx as per pre-operative assessment  Comments: Smooth IV induction Hatchette--- LMA AM CRNA atraumatic--- teeth and mouth as preop --- bilat BS Hatchette

## 2015-07-16 ENCOUNTER — Encounter (HOSPITAL_COMMUNITY): Payer: Self-pay | Admitting: *Deleted

## 2015-07-16 ENCOUNTER — Encounter (HOSPITAL_COMMUNITY): Payer: Self-pay

## 2015-07-16 LAB — BASIC METABOLIC PANEL
ANION GAP: 6 (ref 5–15)
BUN: 17 mg/dL (ref 6–20)
CALCIUM: 8.4 mg/dL — AB (ref 8.9–10.3)
CO2: 29 mmol/L (ref 22–32)
CREATININE: 1.23 mg/dL — AB (ref 0.44–1.00)
Chloride: 104 mmol/L (ref 101–111)
GFR, EST AFRICAN AMERICAN: 46 mL/min — AB (ref >60)
GFR, EST NON AFRICAN AMERICAN: 40 mL/min — AB (ref >60)
Glucose, Bld: 117 mg/dL — ABNORMAL HIGH (ref 65–99)
Potassium: 4.6 mmol/L (ref 3.5–5.1)
SODIUM: 139 mmol/L (ref 135–145)

## 2015-07-16 LAB — CBC
HEMATOCRIT: 34.2 % — AB (ref 36.0–46.0)
HEMOGLOBIN: 10.8 g/dL — AB (ref 12.0–15.0)
MCH: 29.2 pg (ref 26.0–34.0)
MCHC: 31.6 g/dL (ref 30.0–36.0)
MCV: 92.4 fL (ref 78.0–100.0)
Platelets: 134 10*3/uL — ABNORMAL LOW (ref 150–400)
RBC: 3.7 MIL/uL — ABNORMAL LOW (ref 3.87–5.11)
RDW: 13.8 % (ref 11.5–15.5)
WBC: 7.3 10*3/uL (ref 4.0–10.5)

## 2015-07-16 MED ORDER — PROMETHAZINE HCL 25 MG/ML IJ SOLN
12.5000 mg | Freq: Three times a day (TID) | INTRAMUSCULAR | Status: DC | PRN
  Administered 2015-07-16: 12.5 mg via INTRAVENOUS
  Filled 2015-07-16: qty 1

## 2015-07-16 NOTE — Progress Notes (Signed)
Patient ID: Heather Smith, female   DOB: 1932/02/14, 82 y.o.   MRN: [id number] PATIENT ID: Heather Smith  MRN: [id number]  DOB/AGE:  Apr 08, 1932 / 82 y.o.  1 Day Post-Op Procedure(s) (LRB): LEFT TOTAL KNEE ARTHROPLASTY (Left)    PROGRESS NOTE Subjective: Patient is alert, oriented, no Nausea, no Vomiting, yes passing gas. Taking PO well. Denies SOB, Chest or Calf Pain. Using Incentive Spirometer, PAS in place. Ambulate WBAT, CPM - Discontinued after post op bleeding yesterday. ROM today 0-80 manually Patient reports pain as 2/10 .    Objective: Vital signs in last 24 hours: Filed Vitals:   07/15/15 1748 07/15/15 1900 07/16/15 0100 07/16/15 0703  BP: 128/55 124/66 115/49 113/58  Pulse: 59 60 62 70  Temp: 97.7 F (36.5 C) 97.8 F (36.6 C) 98.3 F (36.8 C) 98.1 F (36.7 C)  TempSrc:   Axillary Oral  Resp: 16  16 16   Height:      Weight:      SpO2: 100%  100% 98%      Intake/Output from previous day: I/O last 3 completed shifts: In: [id number] [P.O.:290; I.V.:1550] Out: 150 [Blood:150]   Intake/Output this shift:     LABORATORY DATA:  Recent Labs  07/15/15 1209 07/16/15 0543  WBC  --  7.3  HGB  --  10.8*  HCT  --  34.2*  PLT  --  134*  INR 0.97  --     Examination: Neurologically intact ABD soft Neurovascular intact Sensation intact distally Intact pulses distally Dorsiflexion/Plantar flexion intact Incision: moderate drainage No cellulitis present Compartment soft} Dressing taken down and Aquacil removed. Only superficial subcuticular bleeding no effusion in the joint itself. Patient demonstrated active straight leg raise in bed this morning and actively ranged from 0-80 on her own. Assessment:   1 Day Post-Op Procedure(s) (LRB): LEFT TOTAL KNEE ARTHROPLASTY (Left) ADDITIONAL DIAGNOSIS: Expected Acute Blood Loss Anemia, Hypertension, cardiomyopathy, history of left bundle branch block. Plan: PT/OT WBAT, CPM discontinued secondary to postop bleeding  yesterday  DVT Prophylaxis:  SCDx72hrs, ASA 325 mg BID x 2 weeks DISCHARGE PLAN: Home, after patient passes PT goals. Will replace 4 x 4 and Ace wrap dressing with new Aquacel dressing tomorrow. DISCHARGE NEEDS: HHPT, Walker and 3-in-1 comode seat     Keghan Mcfarren J 07/16/2015, 7:03 AM

## 2015-07-16 NOTE — Progress Notes (Signed)
Heather Smith had scheduled for a knee surgery Monday 07/15/15. She was discharged from Pulmonary Maintenance 07/11/15. Averee knows that she will have physical therapy after her surgery. She is very interested in returing back to the program as soon as she is able to.

## 2015-07-16 NOTE — Care Management Note (Signed)
Case Management Note  Patient Details  Name: Heather Smith MRN: [id number] Date of Birth: Jul 24, 1932  Subjective/Objective:    82 yr old female s/p left total knee arthroplasty.                Action/Plan: Case manager spoke with patient at bedside concerning home health needs. Choice for Home Health agencies was offered, referral was called to [place], [facility] Liaison.  Patient will have family support at discharge. Patient has DME .   Expected Discharge Date:   07/17/15               Expected Discharge Plan:  [place]  In-House Referral:     Discharge planning Services  CM Consult  Post Acute Care Choice:  Home Health Choice offered to:  Patient  DME Arranged:  N/A DME Agency:     HH Arranged:  PT HH Agency:  [facility]  Status of Service:  Completed, signed off  Medicare Important Message Given:  Yes Date Medicare IM Given:    Medicare IM give by:    Date Additional Medicare IM Given:    Additional Medicare Important Message give by:     If discussed at [place] of Stay Meetings, dates discussed:    Additional Comments:  Ninfa Meeker, RN 07/16/2015, 12:06 PM

## 2015-07-16 NOTE — Care Management Important Message (Signed)
Important Message  Patient Details  Name: Heather Smith MRN: [id number] Date of Birth: [birth year]   Medicare Important Message Given:  Yes    Loann Quill 07/16/2015, 8:51 AM

## 2015-07-16 NOTE — Evaluation (Signed)
Physical Therapy Evaluation Patient Details Name: Heather Smith MRN: [id number] DOB: Jan 13, 1933 Today's Date: 07/16/2015   History of Present Illness  s/p L TKA. PMH: HTN, OA, defibrillator, CHF, spinal stenosis.  Clinical Impression  Pt was able to tolerate visit despite some discomfort, medicated before PT arrived.  Her pain level was controlled and was demonstrating excellent ROM to L knee.  Her plan is possibly to go home tomorrow and will need to try steps to enter house, will need husband to be available if possible.    Follow Up Recommendations Home health PT;Supervision for mobility/OOB    Equipment Recommendations  Rolling walker with 5" wheels    Recommendations for Other Services Rehab consult     Precautions / Restrictions Precautions Precautions: Fall;Knee Precaution Booklet Issued: Yes (comment) Precaution Comments: reviewed knee precautions and started ther ex Restrictions Weight Bearing Restrictions: Yes LLE Weight Bearing: Weight bearing as tolerated Other Position/Activity Restrictions: bone foam in bed      Mobility  Bed Mobility Overal bed mobility: Needs Assistance Bed Mobility: Supine to Sit     Supine to sit: Min guard;Min assist     General bed mobility comments: pt was able to help with bed rail  Transfers Overall transfer level: Needs assistance Equipment used: Rolling walker (2 wheeled) Transfers: Sit to/from [facility] Sit to Stand: Min guard;Min assist Stand pivot transfers: Min guard       General transfer comment: reminders for hand placement  Ambulation/Gait Ambulation/Gait assistance: Min guard Ambulation Distance (Feet): 40 Feet Assistive device: Rolling walker (2 wheeled) Gait Pattern/deviations: Step-through pattern;Decreased weight shift to left;Decreased stance time - left;Wide base of support Gait velocity: reduced Gait velocity interpretation: Below normal speed for age/gender General Gait Details:  careful wbing on LLE with no increase in pain during the activity  Stairs            Wheelchair Mobility    Modified Rankin (Stroke Patients Only)       Balance Overall balance assessment: Needs assistance Sitting-balance support: Feet supported Sitting balance-Leahy Scale: Good   Postural control: Posterior lean Standing balance support: Bilateral upper extremity supported Standing balance-Leahy Scale: Fair                               Pertinent Vitals/Pain Pain Assessment: 0-10 Pain Score: 7  Pain Location: L knee  Pain Descriptors / Indicators: Operative site guarding;Aching Pain Intervention(s): Premedicated before session;Monitored during session;Repositioned    Home Living Family/patient expects to be discharged to:: Private residence Living Arrangements: Spouse/significant other Available Help at Discharge: Family;Available 24 hours/day Type of Home: House Home Access: Stairs to enter Entrance Stairs-Rails: Right;Left;Can reach both Entrance Stairs-Number of Steps: 6 Home Layout: One level Home Equipment: Cane - single point;Bedside commode;Adaptive equipment      Prior Function Level of Independence: Independent               Hand Dominance   Dominant Hand: Right    Extremity/Trunk Assessment   Upper Extremity Assessment: Overall WFL for tasks assessed           Lower Extremity Assessment: LLE deficits/detail   LLE Deficits / Details: stiffness and weakness L knee s/p TKA  Cervical / Trunk Assessment: Normal  Communication   Communication: No difficulties  Cognition Arousal/Alertness: Awake/alert Behavior During Therapy: WFL for tasks assessed/performed Overall Cognitive Status: Within Functional Limits for tasks assessed  General Comments General comments (skin integrity, edema, etc.): Pt is getting up to walk with assistance and limited her distance due to pain.  However, did not  increase doing any of the exercises,     Exercises Total Joint Exercises Ankle Circles/Pumps: AROM;Both;10 reps Quad Sets: AROM;Both;10 reps Heel Slides: AAROM;Left Long Arc Quad: AROM;Left Goniometric ROM: -15 ext and flexion 83 deg AAROM      Assessment/Plan    PT Assessment Patient needs continued PT services  PT Diagnosis Acute pain;Difficulty walking   PT Problem List Decreased strength;Decreased range of motion;Decreased activity tolerance;Decreased balance;Decreased mobility;Decreased coordination;Decreased knowledge of use of DME;Decreased safety awareness;Decreased skin integrity;Pain  PT Treatment Interventions DME instruction;Gait training;Stair training;Functional mobility training;Therapeutic activities;Therapeutic exercise;Balance training;Neuromuscular re-education;Patient/family education   PT Goals (Current goals can be found in the Care Plan section) Acute Rehab PT Goals Patient Stated Goal: get walking, get home PT Goal Formulation: With patient/family Time For Goal Achievement: 07/30/15 Potential to Achieve Goals: Good    Frequency 7X/week   Barriers to discharge Inaccessible home environment 6 steps to enter house    Co-evaluation               End of Session Equipment Utilized During Treatment: Gait belt Activity Tolerance: Patient tolerated treatment well Patient left: in chair;with call bell/phone within reach;with chair alarm set;with family/visitor present Nurse Communication: Mobility status         Time: [id number] PT Time Calculation (min) (ACUTE ONLY): 26 min   Charges:   PT Evaluation $PT Eval Low Complexity: 1 Procedure PT Treatments $Gait Training: 8-22 mins   PT G Codes:        Ramond Dial Aug 06, 2015, 1:37 PM    Mee Hives, PT MS Acute Rehab Dept. Number: [place] and [place]

## 2015-07-16 NOTE — Progress Notes (Signed)
PT Cancellation Note  Patient Details Name: Heather Smith MRN: [id number] DOB: 05-21-1932   Cancelled Treatment:    Reason Eval/Treat Not Completed: Other (comment) (Pt feeling bad and refused, spoke with nursing after).  Try again tomorrow.   Ramond Dial 07/16/2015, 1:43 PM    Mee Hives, PT MS Acute Rehab Dept. Number: [place] and [place]

## 2015-07-16 NOTE — Evaluation (Signed)
Occupational Therapy Evaluation Patient Details Name: Heather Smith MRN: [id number] DOB: 31-Aug-1932 Today's Date: 07/16/2015    History of Present Illness s/p L TKA. PMH: HTN, OA, defibrillator, CHF, spinal stenosis.   Clinical Impression   Pt was independent in ADL and IADL prior to admission. Presents with increased L knee pain, generalized weakness and impaired standing balance interfering with functional status. Educated pt and husband in multiple uses of 3 in 77, use of AE for LB bathing and dressing, technique for shower transfer and safe footwear. Will follow acutely to reinforce education. Pt doing well POD 1.    Follow Up Recommendations  No OT follow up    Equipment Recommendations   (RW)    Recommendations for Other Services       Precautions / Restrictions Precautions Precautions: Fall;Knee Precaution Comments: instructed in use of bone foam and to avoid pillow behind knee Restrictions Weight Bearing Restrictions: Yes LLE Weight Bearing: Weight bearing as tolerated      Mobility Bed Mobility               General bed mobility comments: pt in chair  Transfers Overall transfer level: Needs assistance Equipment used: Rolling walker (2 wheeled) Transfers: Sit to/from Stand Sit to Stand: Min assist         General transfer comment: verbal cues for technique and hand placement, assist to steady    Balance Overall balance assessment: Needs assistance                                          ADL Overall ADL's : Needs assistance/impaired Eating/Feeding: Independent;Sitting   Grooming: Wash/dry hands;Wash/dry face;Sitting;Set up   Upper Body Bathing: Set up;Sitting   Lower Body Bathing: Minimal assistance;Sit to/from stand Lower Body Bathing Details (indicate cue type and reason): educated pt in use of long handled bath sponge Upper Body Dressing : Set up;Sitting   Lower Body Dressing: Minimal assistance;Sit to/from  stand Lower Body Dressing Details (indicate cue type and reason): educated in use of AE, safe footwear and to dress L LE first and then R           Tub/Shower Transfer Details (indicate cue type and reason): verbal instruction in technique for shower transfer Functional mobility during ADLs: Min guard;Rolling walker       Vision     Perception     Praxis      Pertinent Vitals/Pain Pain Assessment: 0-10 Pain Score: 7  Pain Location: L knee Pain Descriptors / Indicators: Aching Pain Intervention(s): Monitored during session;Premedicated before session;Repositioned;Ice applied     Hand Dominance Right   Extremity/Trunk Assessment Upper Extremity Assessment Upper Extremity Assessment: Overall WFL for tasks assessed   Lower Extremity Assessment Lower Extremity Assessment: Defer to PT evaluation       Communication Communication Communication: No difficulties   Cognition Arousal/Alertness: Awake/alert Behavior During Therapy: WFL for tasks assessed/performed Overall Cognitive Status: Within Functional Limits for tasks assessed                     General Comments       Exercises       Shoulder Instructions      Home Living Family/patient expects to be discharged to:: Private residence Living Arrangements: Spouse/significant other Available Help at Discharge: Family;Available 24 hours/day Type of Home: House Home Access: Stairs to enter [employer] of Steps:  6 Entrance Stairs-Rails: Right;Left;Can reach both Home Layout: One level     Bathroom Shower/Tub: Occupational psychologist: Standard     Home Equipment: Cane - single point;Bedside commode;Adaptive equipment Adaptive Equipment: Reacher        Prior Functioning/Environment Level of Independence: Independent             OT Diagnosis: Generalized weakness;Acute pain   OT Problem List: Decreased activity tolerance;Impaired balance (sitting and/or  standing);Pain;Decreased knowledge of use of DME or AE;Decreased strength;Decreased range of motion   OT Treatment/Interventions: Self-care/ADL training;DME and/or AE instruction;Patient/family education;Balance training    OT Goals(Current goals can be found in the care plan section) Acute Rehab OT Goals Patient Stated Goal: regain mobility OT Goal Formulation: With patient Time For Goal Achievement: 07/23/15 Potential to Achieve Goals: Good ADL Goals Pt Will Perform Grooming: with supervision;standing Pt Will Transfer to Toilet: with supervision;ambulating;bedside commode (over toilet) Pt Will Perform Toileting - Clothing Manipulation and hygiene: with supervision;sit to/from stand Pt Will Perform Tub/Shower Transfer: Shower transfer;with supervision;ambulating;3 in 1;rolling walker  OT Frequency: Min 2X/week   Barriers to D/C:            Co-evaluation              End of Session Equipment Utilized During Treatment: Gait belt;Rolling walker  Activity Tolerance: Patient limited by pain Patient left: in chair;with call bell/phone within reach;with chair alarm set;with family/visitor present   Time: [id number] OT Time Calculation (min): 26 min Charges:  OT General Charges $OT Visit: 1 Procedure OT Evaluation $OT Eval Low Complexity: 1 Procedure OT Treatments $Self Care/Home Management : 8-22 mins G-Codes:    Malka So 07/16/2015, 10:51 AM  [phone]

## 2015-07-17 ENCOUNTER — Encounter (HOSPITAL_COMMUNITY): Payer: Self-pay | Admitting: Orthopedic Surgery

## 2015-07-17 DIAGNOSIS — M1712 Unilateral primary osteoarthritis, left knee: Principal | ICD-10-CM

## 2015-07-17 DIAGNOSIS — I5032 Chronic diastolic (congestive) heart failure: Secondary | ICD-10-CM

## 2015-07-17 DIAGNOSIS — R55 Syncope and collapse: Secondary | ICD-10-CM

## 2015-07-17 LAB — TSH: TSH: 0.421 u[IU]/mL (ref 0.350–4.500)

## 2015-07-17 LAB — CBC
HCT: 32.1 % — ABNORMAL LOW (ref 36.0–46.0)
HEMOGLOBIN: 10.4 g/dL — AB (ref 12.0–15.0)
MCH: 30 pg (ref 26.0–34.0)
MCHC: 32.4 g/dL (ref 30.0–36.0)
MCV: 92.5 fL (ref 78.0–100.0)
PLATELETS: 126 10*3/uL — AB (ref 150–400)
RBC: 3.47 MIL/uL — AB (ref 3.87–5.11)
RDW: 13.6 % (ref 11.5–15.5)
WBC: 9.3 10*3/uL (ref 4.0–10.5)

## 2015-07-17 LAB — GLUCOSE, CAPILLARY: GLUCOSE-CAPILLARY: 108 mg/dL — AB (ref 65–99)

## 2015-07-17 MED ORDER — SODIUM CHLORIDE 0.9 % IV BOLUS (SEPSIS)
500.0000 mL | Freq: Once | INTRAVENOUS | Status: AC | PRN
  Administered 2015-07-17: 250 mL via INTRAVENOUS

## 2015-07-17 MED ORDER — MECLIZINE HCL 12.5 MG PO TABS
12.5000 mg | ORAL_TABLET | Freq: Two times a day (BID) | ORAL | Status: DC | PRN
  Filled 2015-07-17: qty 1

## 2015-07-17 NOTE — Significant Event (Signed)
Rapid Response Event Note  Overview:  Called to assist with patient with syncopal episode while walking with PT Time Called: 1425 Arrival Time: 1430 Event Type: Hypotension  Initial Focused Assessment:  On arrival patient sitting in chair - awake - skin warm and dry - oriented and answering questions approp - denies CP or SOB - PT staff at bedside - states she walked down the hall without problems - when back to door of room became weak less responsive - was guided to chair - BP reported at that time was 62/32 with low O2 sats - staff not sure of waveform on monitor at that time - HR noted with pulse ox was in the 30's per the staff - of note the patient has a pacemaker.  After sitting the staff reported BP at 80/35 and patient was more alert.  On my arrival the BP was 94/40 - HR by apical was 68 RR 16 O2 sats 98%.  Family reports patient has not had much to eat or drink since surgery on Monday - and reports she slept all day yesterday. Abd soft - non-tender - bil BS = clear.   Patient denies pain in calf area - toes warm - pulses present.  Left knee DDI - edema above and below dressing - at baseline per RN.  CBG 108.     Interventions: NS 250 cc IV bolus started - patient with hx NICM.  Stat 12 lead EKG done.  Call to MD per staff.  Family updated.  BP responding to fluid:  104/52  12 lead HR 68 intrinsic pwaves - rate 68 with vpacing. Patient reports she paces 96% of the time. Patient continues to deny pain - states she feels better just tired.  Post NS bolus BP 95/36 - gentle NS rate of 50cc/hr for now.  Will watch BP.  Had Coreg, Robaxin and Oxycodone today - also on Entrestro.  Patient states her normal BP will run 90 to 100/systolic.  1545:  Sitting up in chair eating alert denies CP or SOB - BP 100/38 - ortho PA speaking with Shatara RN - she gave update on our interventions - PA to call hospitalist consult.  Handoff to [employer].  Plan of Care (if not transferred):  BP every 30 mins x 2 hours  -call RRT for instability.  Event Summary: Name of Physician Notified: Dr. Mayer Camel at  (by RN at 1435)    at    Outcome: Stayed in room and stabalized  Event End Time: 1615  Quin Hoop

## 2015-07-17 NOTE — Progress Notes (Signed)
PATIENT ID: Heather Smith  MRN: [id number]  DOB/AGE:  1932/06/10 / 82 y.o.  2 Days Post-Op Procedure(s) (LRB): LEFT TOTAL KNEE ARTHROPLASTY (Left)    PROGRESS NOTE Subjective: Patient is alert, oriented, no Nausea, no Vomiting, yes passing gas. Taking PO with small bites. Denies SOB, Chest or Calf Pain. Using Incentive Spirometer, PAS in place. Ambulate WBAT with pt walking 40 ft with therapy, Patient reports pain as 5/10 .    Objective: Vital signs in last 24 hours: Filed Vitals:   07/16/15 1300 07/16/15 2135 07/16/15 2228 07/17/15 0430  BP: 122/56 122/55 128/59 108/46  Pulse: 87 79 78 63  Temp: 99 F (37.2 C) 98.6 F (37 C) 98.7 F (37.1 C) 98.3 F (36.8 C)  TempSrc:  Oral Oral Oral  Resp: 18 20 20 18   Height:      Weight:      SpO2: 93% 93% 98% 97%      Intake/Output from previous day: I/O last 3 completed shifts: In: 1512.9 [P.O.:240; I.V.:1272.9] Out: -    Intake/Output this shift:     LABORATORY DATA:  Recent Labs  07/15/15 1209 07/16/15 0543 07/17/15 0553  WBC  --  7.3 9.3  HGB  --  10.8* 10.4*  HCT  --  34.2* 32.1*  PLT  --  134* 126*  NA  --  139  --   K  --  4.6  --   CL  --  104  --   CO2  --  29  --   BUN  --  17  --   CREATININE  --  1.23*  --   GLUCOSE  --  117*  --   INR 0.97  --   --   CALCIUM  --  8.4*  --     Examination: Neurologically intact Neurovascular intact Sensation intact distally Intact pulses distally Dorsiflexion/Plantar flexion intact Incision: dressing C/D/I and no drainage No cellulitis present Compartment soft}  Assessment:   2 Days Post-Op Procedure(s) (LRB): LEFT TOTAL KNEE ARTHROPLASTY (Left) ADDITIONAL DIAGNOSIS: Expected Acute Blood Loss Anemia, Hypertension, cardiomyopathy, history of left bundle branch block.  Plan: PT/OT WBAT,  DVT Prophylaxis:  SCDx72hrs, ASA 325 mg BID x 2 weeks DISCHARGE PLAN: Home, when pt passes therapy goals DISCHARGE NEEDS: HHPT, Walker and 3-in-1 comode seat      Leeana Creer R 07/17/2015, 8:01 AM

## 2015-07-17 NOTE — Progress Notes (Signed)
Occupational Therapy Treatment Patient Details Name: Heather Smith MRN: [id number] DOB: [birth year] Today's Date: 07/17/2015    History of present illness s/p L TKA. PMH: HTN, OA, defibrillator, CHF, spinal stenosis.   OT comments  Focus of session on educating pt and family in shower transfers. Educated pt in transporting items safely with walker. Pt toileting with supervision with 3 in 1 over toilet. Pt requiring min guard to supervision for mobility. Daughter to purchase long handled bath sponge and will have assist to don and doff LEs until she is able to perform on her own.  Follow Up Recommendations  No OT follow up    Equipment Recommendations       Recommendations for Other Services      Precautions / Restrictions Precautions Precautions: Fall;Knee Precaution Booklet Issued: Yes (comment) Precaution Comments: reviewed knee precautions  Restrictions Weight Bearing Restrictions: Yes LLE Weight Bearing: Weight bearing as tolerated Other Position/Activity Restrictions: bone foam in bed       Mobility Bed Mobility Overal bed mobility: Needs Assistance Bed Mobility: Supine to Sit;Sit to Supine     Supine to sit: Min guard Sit to supine: Min assist   General bed mobility comments: min guard for safety; assist to elevate L LE into bed when returned to supine; cues for technique  Transfers Overall transfer level: Needs assistance Equipment used: Rolling walker (2 wheeled) Transfers: Sit to/from Stand Sit to Stand: Min guard;Supervision         General transfer comment: cues for hand placement and technique    Balance     Sitting balance-Leahy Scale: Good       Standing balance-Leahy Scale: Fair                     ADL Overall ADL's : Needs assistance/impaired     Grooming: Wash/dry hands;Standing;Supervision/safety                   Armed forces technical officer: Supervision/safety;RW;BSC;Ambulation   Toileting- Water quality scientist and  Hygiene: Supervision/safety;Sit to/from stand   Tub/ Shower Transfer: Min guard;Ambulation;Shower Technical sales engineer Details (indicate cue type and reason): daughter and husband observing Functional mobility during ADLs: Supervision/safety;Rolling walker        Vision                     Perception     Praxis      Cognition   Behavior During Therapy: WFL for tasks assessed/performed Overall Cognitive Status: Within Functional Limits for tasks assessed                       Extremity/Trunk Assessment               Exercises    Shoulder Instructions       General Comments      Pertinent Vitals/ Pain       Pain Assessment: Faces Faces Pain Scale: Hurts even more Pain Location: L knee Pain Descriptors / Indicators: Operative site guarding;Sore Pain Intervention(s): Limited activity within patient's tolerance;Monitored during session;Repositioned;Ice applied  Home Living                                          Prior Functioning/Environment              Frequency Min 2X/week     Progress Toward Goals  OT Goals(current goals can now be found in the care plan section)  Progress towards OT goals: Progressing toward goals  Acute Rehab OT Goals Patient Stated Goal: none stated Time For Goal Achievement: 07/23/15 Potential to Achieve Goals: Good  Plan Discharge plan remains appropriate    Co-evaluation                 End of Session Equipment Utilized During Treatment: Gait belt;Rolling walker   Activity Tolerance Patient tolerated treatment well   Patient Left in bed;with call bell/phone within reach;with family/visitor present   Nurse Communication          Time: 1000-1041 OT Time Calculation (min): 41 min  Charges: OT General Charges $OT Visit: 1 Procedure OT Treatments $Self Care/Home Management : 8-22 mins  Malka So 07/17/2015, 11:20 AM  [phone]

## 2015-07-17 NOTE — Progress Notes (Signed)
Physical Therapy Treatment Patient Details Name: Heather Smith MRN: [id number] DOB: 1932/06/17 Today's Date: 07/17/2015    History of Present Illness s/p L TKA. PMH: HTN, OA, defibrillator, CHF, spinal stenosis.    PT Comments    Patient is making progress toward mobility goals, however, limited this session by syncopal event while ambulating back to pt's room. See VS below. Rapid response nurse called and present end of session. Family present. Monitor BP next session.   Follow Up Recommendations  Home health PT;Supervision for mobility/OOB     Equipment Recommendations  Rolling walker with 5" wheels    Recommendations for Other Services Rehab consult     Precautions / Restrictions Precautions Precautions: Fall;Knee Precaution Booklet Issued: Yes (comment) Precaution Comments: reviewed knee precautions  Restrictions Weight Bearing Restrictions: Yes LLE Weight Bearing: Weight bearing as tolerated    Mobility  Bed Mobility Overal bed mobility: Needs Assistance Bed Mobility: Supine to Sit;Sit to Supine     Supine to sit: Min guard     General bed mobility comments: increased time needed  Transfers Overall transfer level: Needs assistance Equipment used: Rolling walker (2 wheeled) Transfers: Sit to/from Stand Sit to Stand: Min guard;Max assist         General transfer comment: cues for hand placement and min guard to satnd; max A for safe descent to recliner due to syncopal event while ambulating back to room; see VS below  Ambulation/Gait Ambulation/Gait assistance: Min guard;Min assist Ambulation Distance (Feet): 125 Feet Assistive device: Rolling walker (2 wheeled) Gait Pattern/deviations: Step-through pattern;Decreased stance time - left;Decreased step length - right;Antalgic Gait velocity: reduced   General Gait Details: min A initially for pre gait activities; min guard for safety; cues for posture, bilat heel strike, and position of RW   Stairs             Wheelchair Mobility    Modified Rankin (Stroke Patients Only)       Balance     Sitting balance-Leahy Scale: Good       Standing balance-Leahy Scale: Fair                      Cognition Arousal/Alertness: Awake/alert Behavior During Therapy: WFL for tasks assessed/performed Overall Cognitive Status: Within Functional Limits for tasks assessed                      Exercises      General Comments General comments (skin integrity, edema, etc.): while ambulating back to room, pt became very weak, unable to respond to therapist, and experienced syncopal event; initial BP upon sitting 62/32 and SpO2 75%, ~5 minutes after sitting BP 80/35, and upon arrival of rapid response nurse BP 94/40 and SpO2 elevated to 97%; RN placed pt on supplemental O2 via nasal canula      Pertinent Vitals/Pain Pain Assessment: 0-10 Pain Score: 8  Pain Location: L knee with mobility Pain Descriptors / Indicators: Sore Pain Intervention(s): Limited activity within patient's tolerance;Monitored during session;Repositioned    Home Living                      Prior Function            PT Goals (current goals can now be found in the care plan section) Acute Rehab PT Goals Patient Stated Goal: none stated PT Goal Formulation: With patient/family Time For Goal Achievement: 07/30/15 Potential to Achieve Goals: Good Progress towards PT goals: Progressing toward  goals    Frequency  7X/week    PT Plan Current plan remains appropriate    Co-evaluation             End of Session Equipment Utilized During Treatment: Gait belt Activity Tolerance: Treatment limited secondary to medical complications (Comment) Patient left: with call bell/phone within reach;with family/visitor present;in chair;with nursing/sitter in room (nursing staff, including rapid response nurse, present)     Time: [id number] PT Time Calculation (min) (ACUTE ONLY): 42  min  Charges:  $Gait Training: 8-22 mins $Therapeutic Activity: 23-37 mins                    G Codes:      Salina April, PTA Pager: [phone]   07/17/2015, 3:05 PM

## 2015-07-17 NOTE — Consult Note (Signed)
Medical Consultation   Heather Smith  [id number]  DOB: 1932/11/30  DOA: 07/15/2015  PCP: Shirline Frees, MD    Requesting physician: Frederik Pear, MD (Ortho) Reason for consultation: Presyncope    History of Present Illness: Heather Smith is an 82 y.o. female    with a history of CHF, Non ICM PMP with defibrillator, and recent LTKR on 6/12, requested to see for evaluation of a presyncopal episode suffered this afternoon. Patien's surgery was performed on 6/12, tolerating the procedure well with the exception of some blood loss and post op pain. She  got up to ambulate with assistance this afternoon  new BP pending.  She did take her BP meds and Lasix and Coreg today. Denies any vision changes.  Denies any headaches. No dysarthria or dysphagia. Denies any unilateral weakness or sensory deficiencies. Denies any confusion. Denies any chest pain, or shortness of breath. Denies any fever or chills Denies any abdominal pain, or diarrhea.  Denies abnormal skin rashes, or neuropathy. No recent infections.  Afebrile. WBC normal.  Labs, EKG unrevealing (V paced  P 68). Last 2 D echo echo in 1/17, showing EF increased to 60-65% and was overall doing better from the cardiac standpoint.         Review of Systems:  As per HPI otherwise 10 point review of systems negative.     Past Medical History: Past Medical History  Diagnosis Date  . Hypertension   . Hypercholesterolemia   . Left bundle branch block   . Hypothyroidism   . Spinal stenosis   . GERD (gastroesophageal reflux disease)   . Vitamin D deficiency   . Neurogenic claudication due to lumbar spinal stenosis   . Chest pain     a. Nl cath in the 90's;  b. 04/2005: Myoview: subtle areao of ? reversibility in apical segment of anteromedial LV - ? attenuation, EF 54%;  c. 05/2012 MV: small, fixed mild apical septal perfusion defect, ? attenuation due to lbbb, no ischemia.  . Non-ischemic cardiomyopathy ([place])     [place]  10/04/12: Normal coronary arteries, EF 25-30% with global HK.  Echocardiogram 10/05/12: EF 30-35%, diffuse HK, moderate LAE. Echocardiogram (01/2013): Lateral to septal wall dyssynchrony EF 20-25%, diffuse HK  . Chronic systolic heart failure ([place])   . Automatic implantable cardioverter-defibrillator in situ   . CHF (congestive heart failure) ([place])   . Osteoarthritis     "about all over" (02/22/2013)  . LV lead partial dislodgment 05/30/2013  . Biventricular defibrillator  [employer] 05/30/2013  . Complication of anesthesia     difficult to wake up and blood pressure drops  . Family history of adverse reaction to anesthesia     difficulty to wake up (daughter)  . Shortness of breath dyspnea     occasional b/c of CHF    Past Surgical History: Past Surgical History  Procedure Laterality Date  . Tubal ligation    . Foot arthrodesis, modified mcbride Bilateral   . Knee arthroscopy Left   . Knee arthroscopy Right   . Cardiac catheterization  2000  . Lumbar laminectomy  11/03/2006     Bilateral 3, 4, 5 laminectomy, partial L2, decompression of the thecal sac, foraminotomy, posterolateral arthrodesis L3-S1 with autograft   -- SURGEON:  Leeroy Cha, M.D.   . Cardiac defibrillator placement  02/22/2013    with LV lead placement and HV lead testing/notes 02/22/2013  . Appendectomy    .  Excisional hemorrhoidectomy    . Tonsillectomy    . Blepharoplasty Bilateral   . Vaginal hysterectomy    . Left heart catheterization with coronary angiogram N/A 10/04/2012    Procedure: LEFT HEART CATHETERIZATION WITH CORONARY ANGIOGRAM;  Surgeon: Peter M Martinique, MD;  Location: [facility] CATH LAB;  Service: Cardiovascular;  Laterality: N/A;  . Bi-ventricular implantable cardioverter defibrillator N/A 02/22/2013    Procedure: BI-VENTRICULAR IMPLANTABLE CARDIOVERTER DEFIBRILLATOR  (CRT-D);  Surgeon: Deboraha Sprang, MD;  Location: [facility] CATH LAB;  Service: Cardiovascular;  Laterality: N/A;  . Total knee arthroplasty Left  07/15/2015    Procedure: LEFT TOTAL KNEE ARTHROPLASTY;  Surgeon: Frederik Pear, MD;  Location: [place];  Service: Orthopedics;  Laterality: Left;     Allergies:   Allergies  Allergen Reactions  . Statins Other (See Comments)    "Makes her bones hurt and very sore"  . Niaspan [Niacin Er]     High doses, causes hot flashes & aching   . Benadryl [Diphenhydramine] Other (See Comments)    Makes pt feel jittery   . Dilaudid [Hydromorphone Hcl] Itching     Social History: Social History   Social History  . Marital Status: Married    Spouse Name: N/A  . Number of Children: N/A  . Years of Education: N/A   Occupational History  . Not on file.   Social History Main Topics  . Smoking status: Never Smoker   . Smokeless tobacco: Never Used  . Alcohol Use: No  . Drug Use: No  . Sexual Activity: Not Currently   Other Topics Concern  . Not on file   Social History Narrative   Lives in [place], [place] with husband.  Active around the house.       Family History: Family History  Problem Relation Age of Onset  . Heart attack Mother     died @ 45.  . Thyroid disease Mother   . Lung cancer Mother   . Hyperlipidemia Mother   . Aneurysm Father     died @ 88.  . Aneurysm Brother     has had an Aneurysm x3  . Colon cancer Brother   . Bone cancer Brother   . Lung cancer Brother   . Stomach cancer Brother     Family history reviewed and not pertinent    Physical Exam: Filed Vitals:   07/17/15 1451 07/17/15 1500 07/17/15 1515 07/17/15 1534  BP: 100/40 101/37 95/36 92/37   Pulse: 68 68 68 68  Temp:      TempSrc:      Resp: 16 18  16   Height:      Weight:      SpO2: 97% 98%  98%    Constitutional: Appears calm,  alert and awake, oriented x3, not in any acute distress. Eyes: PERLA, EOMI, irises appear normal, anicteric sclera,   Neck: neck appears normal, no masses, normal ROM, no thyromegaly, no JVD  CVS: S1-S2 clear, no murmur rubs or gallops, no LE edema, normal pedal  pulses Paced rythm Respiratory: clear to auscultation bilaterally, no wheezing, rales or rhonchi. Respiratory effort normal. No accessory muscle use.  Abdomen: soft nontender, nondistended, normal bowel sounds, no hepatosplenomegaly, no hernias  Musculoskeletal: no cyanosis, clubbing or edema noted . Left knee bandaged  Neuro: Cranial nerves II-XII intact, strength, sensation, reflexes Psych: judgement and insight appear normal, stable mood and affect, mental status    Data reviewed:  I have personally reviewed following labs and imaging studies Labs:  CBC:  Recent Labs Lab 07/16/15 0543 07/17/15 0553  WBC 7.3 9.3  HGB 10.8* 10.4*  HCT 34.2* 32.1*  MCV 92.4 92.5  PLT 134* 126*    Basic Metabolic Panel:  Recent Labs Lab 07/16/15 0543  NA 139  K 4.6  CL 104  CO2 29  GLUCOSE 117*  BUN 17  CREATININE 1.23*  CALCIUM 8.4*   GFR Estimated Creatinine Clearance: 40.5 mL/min (by C-G formula based on Cr of 1.23). Liver Function Tests: No results for input(s): AST, ALT, ALKPHOS, BILITOT, PROT, ALBUMIN in the last 168 hours. No results for input(s): LIPASE, AMYLASE in the last 168 hours. No results for input(s): AMMONIA in the last 168 hours. Coagulation profile  Recent Labs Lab 07/15/15 1209  INR 0.97    Cardiac Enzymes: No results for input(s): CKTOTAL, CKMB, CKMBINDEX, TROPONINI in the last 168 hours. BNP: Invalid input(s): POCBNP CBG:  Recent Labs Lab 07/17/15 1442  GLUCAP 108*   D-Dimer No results for input(s): DDIMER in the last 72 hours. Hgb A1c No results for input(s): HGBA1C in the last 72 hours. Lipid Profile No results for input(s): CHOL, HDL, LDLCALC, TRIG, CHOLHDL, LDLDIRECT in the last 72 hours. Thyroid function studies No results for input(s): TSH, T4TOTAL, T3FREE, THYROIDAB in the last 72 hours.  Invalid input(s): FREET3 Anemia work up No results for input(s): VITAMINB12, FOLATE, FERRITIN, TIBC, IRON, RETICCTPCT in the last 72  hours. Urinalysis    Component Value Date/Time   COLORURINE YELLOW 07/15/2015 1210   APPEARANCEUR CLEAR 07/15/2015 1210   LABSPEC 1.010 07/15/2015 1210   PHURINE 6.0 07/15/2015 1210   GLUCOSEU NEGATIVE 07/15/2015 1210   HGBUR NEGATIVE 07/15/2015 1210   BILIRUBINUR NEGATIVE 07/15/2015 1210   KETONESUR NEGATIVE 07/15/2015 1210   PROTEINUR NEGATIVE 07/15/2015 1210   UROBILINOGEN 0.2 01/30/2014 1511   NITRITE NEGATIVE 07/15/2015 1210   LEUKOCYTESUR NEGATIVE 07/15/2015 1210     Sepsis Labs Invalid input(s): PROCALCITONIN,  WBC,  LACTICIDVEN Microbiology No results found for this or any previous visit (from the past 240 hour(s)).     Inpatient Medications:   Scheduled Meds: . aspirin EC  325 mg Oral Q breakfast  . carvedilol  6.25 mg Oral BID  . citalopram  20 mg Oral Daily  . docusate sodium  100 mg Oral BID  . furosemide  20 mg Oral Q M,W,F  . gabapentin  300 mg Oral BID  . levothyroxine  50 mcg Oral QAC breakfast  . magnesium oxide  400 mg Oral BID  . pantoprazole  40 mg Oral Daily  . sacubitril-valsartan  1 tablet Oral BID  . zolpidem  5 mg Oral QHS   Continuous Infusions: . dextrose 5 % and 0.45 % NaCl with KCl 20 mEq/L 125 mL/hr (07/15/15 2049)     Radiological Exams on Admission: No results found.  Impression/Recommendations Principal Problem:   Primary osteoarthritis of left knee Active Problems:   Pre-syncope   Presyncope, in a patient with a history of CHF, Non ICM PMP with defibrillator, and recent LTKR on 6/12, likely due to Orthostatic Hypotension versus vasovagal. BP 92/37 mmHg  Pulse 68 lying down (was as low as 92/37 at 330 pm). Afebrile. WBC normal.  Labs, EKG unrevealing (V paced  P 68) .Last 2 D echo echo in 1/17, showing EF increased to 60-65% and was overall doing better from the cardiac standpoint. Neuro exam negative.  Check Orthostatics IV fluids up to 2l  No indication at this time to repeat echo or to perform a  CT of the head unless  symptoms worsen.  Meclizine when necessary dizziness  Hold Coreg, Lasix until BP normalizes hopefully in am Consider holding Robaxin IV as per Ortho Check TSH Thank you for this consultation.  Our [facility] hospitalist team will follow the patient with you.   Other medical issues as per admitting team   [facility] E PA-C [facility] 07/17/2015, 5:18 PM

## 2015-07-17 NOTE — Progress Notes (Signed)
Physical Therapy reported to Nurse Patient had a syncope episode while walking in the hallway. Blood pressure initially  62/32. Rapid Response was called. Physician office notified.  Nurse was given PA telephone number. Two messages was left on PA voicemail. Medical office notified once more. Clinical Manager, [place] notified RN Dr. Mayer Camel received message. Received phone from [employer]. Physician Assistant will contact Hospitalist to assess patient.

## 2015-07-17 NOTE — Progress Notes (Signed)
Physical Therapy Treatment Patient Details Name: OVEDA HILLSON MRN: [id number] DOB: 1932-08-11 Today's Date: 07/17/2015    History of Present Illness s/p L TKA. PMH: HTN, OA, defibrillator, CHF, spinal stenosis.    PT Comments    Patient is progressing well toward mobility goals. Overall min guard/min A this session (grossly min guard). Patient needs to practice stairs next session.     Follow Up Recommendations  Home health PT;Supervision for mobility/OOB     Equipment Recommendations  Rolling walker with 5" wheels    Recommendations for Other Services Rehab consult     Precautions / Restrictions Precautions Precautions: Fall;Knee Precaution Booklet Issued: Yes (comment) Precaution Comments: reviewed knee precautions  Restrictions Weight Bearing Restrictions: Yes LLE Weight Bearing: Weight bearing as tolerated    Mobility  Bed Mobility Overal bed mobility: Needs Assistance Bed Mobility: Supine to Sit;Sit to Supine     Supine to sit: Min guard Sit to supine: Min assist   General bed mobility comments: min guard for safety; assist to elevate L LE into bed when returned to supine; cues for technique  Transfers Overall transfer level: Needs assistance Equipment used: Rolling walker (2 wheeled) Transfers: Sit to/from Stand Sit to Stand: Min guard         General transfer comment: cues for hand placement and technique  Ambulation/Gait Ambulation/Gait assistance: Min guard Ambulation Distance (Feet): 100 Feet Assistive device: Rolling walker (2 wheeled) Gait Pattern/deviations: Step-through pattern;Decreased stride length;Decreased weight shift to left;Decreased step length - left;Antalgic Gait velocity: reduced   General Gait Details: cues for posture, sequencing, and position of RW; encouraged pt to increase WB on L LE; relies heavily on UE support    Stairs            Wheelchair Mobility    Modified Rankin (Stroke Patients Only)        Balance     Sitting balance-Leahy Scale: Good       Standing balance-Leahy Scale: Fair                      Cognition Arousal/Alertness: Awake/alert Behavior During Therapy: WFL for tasks assessed/performed Overall Cognitive Status: Within Functional Limits for tasks assessed                      Exercises Total Joint Exercises Quad Sets: AROM;10 reps;Left;Supine Heel Slides: Left;AROM;10 reps;Supine Goniometric ROM: 3-85    General Comments        Pertinent Vitals/Pain Pain Assessment: Faces Faces Pain Scale: Hurts little more Pain Location: L knee Pain Descriptors / Indicators: Sore Pain Intervention(s): Limited activity within patient's tolerance;Monitored during session;Premedicated before session;Repositioned    Home Living                      Prior Function            PT Goals (current goals can now be found in the care plan section) Acute Rehab PT Goals Patient Stated Goal: none stated PT Goal Formulation: With patient/family Time For Goal Achievement: 07/30/15 Potential to Achieve Goals: Good Progress towards PT goals: Progressing toward goals    Frequency  7X/week    PT Plan Current plan remains appropriate    Co-evaluation             End of Session Equipment Utilized During Treatment: Gait belt Activity Tolerance: Patient tolerated treatment well Patient left: with call bell/phone within reach;with family/visitor present;in bed;with SCD's reapplied  Time: [id number] PT Time Calculation (min) (ACUTE ONLY): 28 min  Charges:  $Gait Training: 8-22 mins $Therapeutic Exercise: 8-22 mins                    G Codes:      Salina April, PTA Pager: [phone]   07/17/2015, 10:33 AM

## 2015-07-18 ENCOUNTER — Encounter (HOSPITAL_COMMUNITY): Payer: Self-pay

## 2015-07-18 LAB — CBC
HEMATOCRIT: 30.9 % — AB (ref 36.0–46.0)
HEMOGLOBIN: 9.9 g/dL — AB (ref 12.0–15.0)
MCH: 29.6 pg (ref 26.0–34.0)
MCHC: 32 g/dL (ref 30.0–36.0)
MCV: 92.2 fL (ref 78.0–100.0)
Platelets: 136 10*3/uL — ABNORMAL LOW (ref 150–400)
RBC: 3.35 MIL/uL — ABNORMAL LOW (ref 3.87–5.11)
RDW: 13.5 % (ref 11.5–15.5)
WBC: 8.2 10*3/uL (ref 4.0–10.5)

## 2015-07-18 NOTE — Progress Notes (Signed)
Physical Therapy Note  Despite having increased pain during second session, pt was able to increase overall mobility and perform stair gait.  Feel pt is ready for D/C from PT perspective, RN aware.      07/18/15 1156  PT Visit Information  Last PT Received On 07/18/15  Assistance Needed +1  History of Present Illness s/p L TKA. PMH: HTN, OA, defibrillator, CHF, spinal stenosis.  Subjective Data  Subjective "It's really hurting."  Patient Stated Goal Home  Precautions  Precautions Fall;Knee  Restrictions  Weight Bearing Restrictions Yes  LLE Weight Bearing WBAT  Pain Assessment  Pain Assessment 0-10  Pain Score 8  Pain Location L knee  Pain Descriptors / Indicators Aching;Grimacing;Guarding  Pain Intervention(s) Monitored during session;Premedicated before session;Repositioned  Cognition  Arousal/Alertness Awake/alert  Behavior During Therapy WFL for tasks assessed/performed  Overall Cognitive Status Within Functional Limits for tasks assessed  Bed Mobility  Overal bed mobility Needs Assistance  Bed Mobility Sit to Supine  Sit to supine Min assist  General bed mobility comments A with L LE today.    Transfers  Overall transfer level Needs assistance  Equipment used Rolling walker (2 wheeled)  Transfers Sit to/from Stand  Sit to Stand Min guard  General transfer comment cues for positioning of L LE to decrease flexion with coming to sitting as pt indicates very painful.    Ambulation/Gait  Ambulation/Gait assistance Min guard  Ambulation Distance (Feet) 200 Feet  Assistive device Rolling walker (2 wheeled)  Gait Pattern/deviations Step-to pattern;Decreased step length - right;Decreased stance time - left;Trunk flexed  General Gait Details cues for more upright posture and encouragement.  pt denies any dizziness or lightheaded feelings today.  VSS.    Stairs Yes  Stairs assistance Min guard  Stair Management Two rails;Step to pattern;Forwards  Number of Stairs 3  General  stair comments pt follows all cueing well for stair technique and safety.    Balance  Overall balance assessment Needs assistance  Sitting-balance support No upper extremity supported;Feet supported  Sitting balance-Leahy Scale Good  Standing balance support No upper extremity supported;During functional activity  Standing balance-Leahy Scale Fair  PT - End of Session  Equipment Utilized During Treatment Gait belt  Activity Tolerance Patient tolerated treatment well  Patient left in bed;with call bell/phone within reach;with family/visitor present  Nurse Communication Mobility status  PT - Assessment/Plan  PT Plan Current plan remains appropriate  PT Frequency (ACUTE ONLY) 7X/week  Recommendations for Other Services Rehab consult  Follow Up Recommendations Home health PT;Supervision for mobility/OOB  PT equipment Rolling walker with 5" wheels  PT Goal Progression  Progress towards PT goals Progressing toward goals  Acute Rehab PT Goals  PT Goal Formulation With patient/family  Time For Goal Achievement 07/30/15  Potential to Achieve Goals Good  PT Time Calculation  PT Start Time (ACUTE ONLY) 1058  PT Stop Time (ACUTE ONLY) 1125  PT Time Calculation (min) (ACUTE ONLY) 27 min  PT General Charges  $$ ACUTE PT VISIT 1 Procedure  PT Treatments  $Gait Training 23-37 mins   [place], [place] [phone]

## 2015-07-18 NOTE — Progress Notes (Signed)
Physical Therapy Treatment Patient Details Name: Heather Smith MRN: [id number] DOB: 1932-09-17 Today's Date: 07/18/2015    History of Present Illness s/p L TKA. PMH: HTN, OA, defibrillator, CHF, spinal stenosis.    PT Comments    Pt mobilizing well and without c/o dizziness or lightheadedness.  BP 114/50 pre-gait and 112/55 post-gait, with O2 sats remaining 95-96% on RA.  Pt asking questions about whether she should consider SNF for rehab given syncopal episode yesterday.  Indicated that pt is doing well today and that from PT perspective she did not need SNF level of care unless medical needs changed.  Will continue to follow.    Follow Up Recommendations  Home health PT;Supervision for mobility/OOB     Equipment Recommendations  Rolling walker with 5" wheels    Recommendations for Other Services       Precautions / Restrictions Precautions Precautions: Fall;Knee Restrictions Weight Bearing Restrictions: Yes LLE Weight Bearing: Weight bearing as tolerated    Mobility  Bed Mobility Overal bed mobility: Needs Assistance Bed Mobility: Supine to Sit     Supine to sit: Min assist     General bed mobility comments: A with L LE today.    Transfers Overall transfer level: Needs assistance Equipment used: Rolling walker (2 wheeled) Transfers: Sit to/from Stand Sit to Stand: Min guard         General transfer comment: cues for positioning of L LE to decrease flexion with coming to sitting as pt indicates very painful.    Ambulation/Gait Ambulation/Gait assistance: Min guard Ambulation Distance (Feet): 140 Feet Assistive device: Rolling walker (2 wheeled) Gait Pattern/deviations: Step-to pattern;Decreased step length - right;Decreased stance time - left;Trunk flexed     General Gait Details: cues for more upright posture and encouragement.  pt denies any dizziness or lightheaded feelings today.  VSS.     Stairs            Wheelchair Mobility    Modified  Rankin (Stroke Patients Only)       Balance Overall balance assessment: Needs assistance Sitting-balance support: No upper extremity supported;Feet supported Sitting balance-Leahy Scale: Good     Standing balance support: No upper extremity supported;During functional activity Standing balance-Leahy Scale: Fair                      Cognition Arousal/Alertness: Awake/alert Behavior During Therapy: WFL for tasks assessed/performed Overall Cognitive Status: Within Functional Limits for tasks assessed                      Exercises Total Joint Exercises Ankle Circles/Pumps: AROM;Both;10 reps Quad Sets: AROM;Left;10 reps Hip ABduction/ADduction: AAROM;Left;10 reps Long Arc Quad: AAROM;Left;10 reps Knee Flexion: AAROM;Left;10 reps Goniometric ROM: AAROM ~ 15 - 80    General Comments        Pertinent Vitals/Pain Pain Assessment: 0-10 Pain Score: 7  Pain Location: L knee Pain Descriptors / Indicators: Sore Pain Intervention(s): Monitored during session;Limited activity within patient's tolerance;Repositioned    Home Living                      Prior Function            PT Goals (current goals can now be found in the care plan section) Acute Rehab PT Goals Patient Stated Goal: none stated PT Goal Formulation: With patient/family Time For Goal Achievement: 07/30/15 Potential to Achieve Goals: Good Progress towards PT goals: Progressing toward goals    Frequency  7X/week    PT Plan Current plan remains appropriate    Co-evaluation             End of Session Equipment Utilized During Treatment: Gait belt Activity Tolerance: Patient tolerated treatment well Patient left: in chair;with call bell/phone within reach;with family/visitor present     Time: [id number] PT Time Calculation (min) (ACUTE ONLY): 27 min  Charges:  $Gait Training: 8-22 mins $Therapeutic Exercise: 8-22 mins                    G CodesCatarina Hartshorn, [place] 07/18/2015, 11:53 AM

## 2015-07-18 NOTE — Progress Notes (Signed)
Patient ID: Heather Smith, female   DOB: [birth year], 82 y.o.   MRN: [id number] PATIENT ID: Heather Smith  MRN: [id number]  DOB/AGE:  Jun 03, 1932 / 82 y.o.  3 Days Post-Op Procedure(s) (LRB): LEFT TOTAL KNEE ARTHROPLASTY (Left)    PROGRESS NOTE Subjective: Patient is alert, oriented, no Nausea, no Vomiting, yes passing gas. Taking PO well. Denies SOB, Chest or Calf Pain. Using Incentive Spirometer, PAS in place. Ambulate 125 feet yesterday but got dizzy at the end of PT, CPM 0-60 Patient reports pain as 4/10, evaluated by internal medicine for the dizziness and it was felt to be secondary to dehydration and limited oral intake which has improved since yesterday .    Objective: Vital signs in last 24 hours: Filed Vitals:   07/17/15 1534 07/17/15 2021 07/18/15 0118 07/18/15 0427  BP: 92/37 113/34 106/44 113/54  Pulse: 68 90 83 75  Temp:  99.2 F (37.3 C)  98.9 F (37.2 C)  TempSrc:  Oral    Resp: 16 18  16   Height:      Weight:      SpO2: 98% 94% 94% 96%      Intake/Output from previous day: I/O last 3 completed shifts: In: 600 [P.O.:600] Out: -    Intake/Output this shift:     LABORATORY DATA:  Recent Labs  07/15/15 1209  07/16/15 0543 07/17/15 0553 07/17/15 1442 07/18/15 0542  WBC  --   < > 7.3 9.3  --  8.2  HGB  --   < > 10.8* 10.4*  --  9.9*  HCT  --   < > 34.2* 32.1*  --  30.9*  PLT  --   < > 134* 126*  --  136*  NA  --   --  139  --   --   --   K  --   --  4.6  --   --   --   CL  --   --  104  --   --   --   CO2  --   --  29  --   --   --   BUN  --   --  17  --   --   --   CREATININE  --   --  1.23*  --   --   --   GLUCOSE  --   --  117*  --   --   --   GLUCAP  --   --   --   --  108*  --   INR 0.97  --   --   --   --   --   CALCIUM  --   --  8.4*  --   --   --   < > = values in this interval not displayed.  Examination: Neurologically intact ABD soft Neurovascular intact Sensation intact distally Intact pulses distally Dorsiflexion/Plantar  flexion intact Incision: dressing C/D/I No cellulitis present Compartment soft}  Assessment:   3 Days Post-Op Procedure(s) (LRB): LEFT TOTAL KNEE ARTHROPLASTY (Left) ADDITIONAL DIAGNOSIS: Expected Acute Blood Loss Anemia, pre-syncope, hypertension, nonischemic cardiomyopathy, PVCs  Plan: PT/OT WBAT, CPM 5/hrs day until ROM 0-90 degrees, then D/C CPM DVT Prophylaxis:  SCDx72hrs, ASA 325 mg BID x 2 weeks DISCHARGE PLAN: Home, today she does well with physical therapy if not we may need to arrange a short stay in a skilled nursing facility. DISCHARGE NEEDS: HHPT, CPM, Walker and 3-in-1 comode seat  Sybel Standish J 07/18/2015, 7:57 AM

## 2015-07-18 NOTE — Progress Notes (Signed)
Heather Smith to be D/C'd Home per MD order.  Discussed with the patient and all questions fully answered.  VSS, Skin clean, dry and intact without evidence of skin break down, no evidence of skin tears noted. IV catheter discontinued intact. Site without signs and symptoms of complications. Dressing and pressure applied.  An After Visit Summary was printed and given to the patient. Patient received prescription.  D/c education completed with patient/family including follow up instructions, medication list, d/c activities limitations if indicated, with other d/c instructions as indicated by MD - patient able to verbalize understanding, all questions fully answered.   Patient instructed to return to ED, call 911, or call MD for any changes in condition.   Patient escorted via [place], and D/C home via private auto.  Jerry Caras 07/18/2015 4:17 PM

## 2015-07-18 NOTE — Care Management Important Message (Signed)
Important Message  Patient Details  Name: Heather Smith MRN: [id number] Date of Birth: 02/02/1933   Medicare Important Message Given:  Yes    Loann Quill 07/18/2015, 9:59 AM

## 2015-07-18 NOTE — Discharge Summary (Signed)
Patient ID: Heather Smith MRN: [id number] DOB/AGE: [birth year] 82 y.o.  Admit date: 07/15/2015 Discharge date: 07/18/2015  Admission Diagnoses:  Principal Problem:   Primary osteoarthritis of left knee Active Problems:   Pre-syncope   Chronic diastolic congestive heart failure [facility])   Discharge Diagnoses:  Same  Past Medical History  Diagnosis Date  . Hypertension   . Hypercholesterolemia   . Left bundle branch block   . Hypothyroidism   . Spinal stenosis   . GERD (gastroesophageal reflux disease)   . Vitamin D deficiency   . Neurogenic claudication due to lumbar spinal stenosis   . Chest pain     a. Nl cath in the 90's;  b. 04/2005: Myoview: subtle areao of ? reversibility in apical segment of anteromedial LV - ? attenuation, EF 54%;  c. 05/2012 MV: small, fixed mild apical septal perfusion defect, ? attenuation due to lbbb, no ischemia.  . Non-ischemic cardiomyopathy ([place])     [place] 10/04/12: Normal coronary arteries, EF 25-30% with global HK.  Echocardiogram 10/05/12: EF 30-35%, diffuse HK, moderate LAE. Echocardiogram (01/2013): Lateral to septal wall dyssynchrony EF 20-25%, diffuse HK  . Chronic systolic heart failure ([place])   . Automatic implantable cardioverter-defibrillator in situ   . CHF (congestive heart failure) ([place])   . Osteoarthritis     "about all over" (02/22/2013)  . LV lead partial dislodgment 05/30/2013  . Biventricular defibrillator  [employer] 05/30/2013  . Complication of anesthesia     difficult to wake up and blood pressure drops  . Family history of adverse reaction to anesthesia     difficulty to wake up (daughter)  . Shortness of breath dyspnea     occasional b/c of CHF    Surgeries: Procedure(s): LEFT TOTAL KNEE ARTHROPLASTY on 07/15/2015   Consultants:    Discharged Condition: Improved  Hospital Course: Heather Smith is an 82 y.o. female who was admitted 07/15/2015 for operative treatment ofPrimary osteoarthritis of left knee. Patient has  severe unremitting pain that affects sleep, daily activities, and work/hobbies. After pre-op clearance the patient was taken to the operating room on 07/15/2015 and underwent  Procedure(s): LEFT TOTAL KNEE ARTHROPLASTY.    Patient was given perioperative antibiotics: Anti-infectives    Start     Dose/Rate Route Frequency Ordered Stop   07/15/15 1440  cefUROXime (ZINACEF) injection  Status:  Discontinued       As needed 07/15/15 1440 07/15/15 1550   07/15/15 1330  ceFAZolin (ANCEF) 3 g in dextrose 5 % 50 mL IVPB  Status:  Discontinued     3 g 130 mL/hr over 30 Minutes Intravenous To [facility]Stay [facility] 07/12/15 2235 07/15/15 1741       Patient was given sequential compression devices, early ambulation, and chemoprophylaxis to prevent DVT.  Patient benefited maximally from hospital stay and there were no complications.    Recent vital signs: Patient Vitals for the past 24 hrs:  BP Temp Temp src Pulse Resp SpO2  07/18/15 1300 118/60 mmHg 98.5 F (36.9 C) Oral 78 16 100 %  07/18/15 1100 - - - - - 100 %  07/18/15 0427 (!) 113/54 mmHg 98.9 F (37.2 C) - 75 16 96 %  07/18/15 0118 (!) 106/44 mmHg - - 83 - 94 %  07/17/15 2021 (!) 113/34 mmHg 99.2 F (37.3 C) Oral 90 18 94 %  07/17/15 1534 (!) 92/37 mmHg - - 68 16 98 %  07/17/15 1515 (!) 95/36 mmHg - - 68 - -  07/17/15 1500 Marland Kitchen)  101/37 mmHg - - 68 18 98 %  07/17/15 1451 (!) 100/40 mmHg - - 68 16 97 %     Recent laboratory studies:  Recent Labs  07/16/15 0543 07/17/15 0553 07/18/15 0542  WBC 7.3 9.3 8.2  HGB 10.8* 10.4* 9.9*  HCT 34.2* 32.1* 30.9*  PLT 134* 126* 136*  NA 139  --   --   K 4.6  --   --   CL 104  --   --   CO2 29  --   --   BUN 17  --   --   CREATININE 1.23*  --   --   GLUCOSE 117*  --   --   CALCIUM 8.4*  --   --      Discharge Medications:     Medication List    TAKE these medications        acetaminophen 650 MG CR tablet  Commonly known as:  TYLENOL  Take 650 mg by mouth every 8 (eight) hours as needed  for pain.     aspirin EC 325 MG tablet  Take 1 tablet (325 mg total) by mouth 2 (two) times daily.     carvedilol 6.25 MG tablet  Commonly known as:  COREG  Take 1 tablet (6.25 mg total) by mouth 2 (two) times daily.     citalopram 20 MG tablet  Commonly known as:  CELEXA  Take 20 mg by mouth daily as needed.     CoQ10 100 MG Caps  Take 1 capsule by mouth daily.     furosemide 20 MG tablet  Commonly known as:  LASIX  TAKE ONE TABLET BY MOUTH ONCE DAILY ON MONDAY, WEDNESDAY AND FRIDAY     gabapentin 300 MG capsule  Commonly known as:  NEURONTIN  Take 300 mg by mouth 2 (two) times daily.     levothyroxine 50 MCG tablet  Commonly known as:  SYNTHROID, LEVOTHROID  Take 50 mcg by mouth daily.     MAG-200 PO  Take 1 tablet by mouth 2 (two) times daily.     multivitamin per tablet  Take 1 tablet by mouth daily.     oxyCODONE-acetaminophen 5-325 MG tablet  Commonly known as:  ROXICET  Take 1 tablet by mouth every 4 (four) hours as needed.     pantoprazole 40 MG tablet  Commonly known as:  PROTONIX  Take 40 mg by mouth daily.     sacubitril-valsartan 24-26 MG  Commonly known as:  ENTRESTO  Take 1 tablet by mouth 2 (two) times daily.     tiZANidine 2 MG tablet  Commonly known as:  ZANAFLEX  Take 1 tablet (2 mg total) by mouth every 6 (six) hours as needed for muscle spasms.     traMADol 50 MG tablet  Commonly known as:  ULTRAM  Take 1 tablet by mouth every 12 (twelve) hours as needed for moderate pain.     VITAMIN B COMPLEX PO  Take 1 tablet by mouth daily. Reported on 01/30/2015     vitamin C 1000 MG tablet  Take 1,000 mg by mouth daily.     vitamin E 400 UNIT capsule  Take 400 Units by mouth daily.     zolpidem 6.25 MG CR tablet  Commonly known as:  AMBIEN CR  Take 1 tablet (6.25 mg total) by mouth at bedtime as needed for sleep.        Diagnostic Studies: Dg Chest 2 View  07/15/2015  CLINICAL DATA:  82 year old female for left knee replacement. Preop  evaluation. EXAM: CHEST  2 VIEW COMPARISON:  Chest radiograph dated 06/04/2014 FINDINGS: Two views of the chest do not demonstrate a focal consolidation. There is no pleural effusion or pneumothorax. The cardiac silhouette is within normal limits. Left pectoral AICD device. No acute osseous pathology. IMPRESSION: No active cardiopulmonary disease. Electronically Signed   By: Anner Crete M.D.   On: 07/15/2015 11:59    Disposition: 01-Home or Self Care      Discharge Instructions    CPM    Complete by:  As directed   Continuous passive motion machine (CPM):      Use the CPM from 0 to 60  for 5 hours per day.      You may increase by 10 degrees per day.  You may break it up into 2 or 3 sessions per day.      Use CPM for 2 weeks or until you are told to stop.     Call MD / Call 911    Complete by:  As directed   If you experience chest pain or shortness of breath, CALL 911 and be transported to the hospital emergency room.  If you develope a fever above 101 F, pus (white drainage) or increased drainage or redness at the wound, or calf pain, call your surgeon's office.     Constipation Prevention    Complete by:  As directed   Drink plenty of fluids.  Prune juice may be helpful.  You may use a stool softener, such as Colace (over the counter) 100 mg twice a day.  Use MiraLax (over the counter) for constipation as needed.     Diet - low sodium heart healthy    Complete by:  As directed      Driving restrictions    Complete by:  As directed   No driving for 2 weeks     Increase activity slowly as tolerated    Complete by:  As directed      Patient may shower    Complete by:  As directed   You may shower without a dressing once there is no drainage.  Do not wash over the wound.  If drainage remains, cover wound with plastic wrap and then shower.           Follow-up Information    Follow up with Kerin Salen, MD In 2 weeks.   Specialty:  Orthopedic Surgery   Contact information:    [place] [id number] [phone]       Follow up with [facility].   Specialty:  Home Health Services   Why:  someone from [facility] will contact you to arrange start date and time for therpy.   Contact information:   [place] [id number] [phone]        Signed: Hardin Negus, Bohden Dung R 07/18/2015, 2:41 PM

## 2015-07-18 NOTE — Progress Notes (Signed)
Pt seen and examined at the  Bedside. Family at the bedside. Pt feels better this am, ambulated without dizziness or lightheadedness. I paged cardiology to see if someone can check the devices (pacer and defibrillator). Blood work reviewed.  Discharge per primary team.  Leisa Lenz [facility] [phone] or [phone] for questions.

## 2015-07-19 DIAGNOSIS — I110 Hypertensive heart disease with heart failure: Secondary | ICD-10-CM | POA: Diagnosis not present

## 2015-07-19 DIAGNOSIS — I5042 Chronic combined systolic (congestive) and diastolic (congestive) heart failure: Secondary | ICD-10-CM | POA: Diagnosis not present

## 2015-07-19 DIAGNOSIS — Z471 Aftercare following joint replacement surgery: Secondary | ICD-10-CM | POA: Diagnosis not present

## 2015-07-19 DIAGNOSIS — R69 Illness, unspecified: Secondary | ICD-10-CM | POA: Diagnosis not present

## 2015-07-19 DIAGNOSIS — M1991 Primary osteoarthritis, unspecified site: Secondary | ICD-10-CM | POA: Diagnosis not present

## 2015-07-19 DIAGNOSIS — E7800 Pure hypercholesterolemia, unspecified: Secondary | ICD-10-CM | POA: Diagnosis not present

## 2015-07-19 DIAGNOSIS — M4806 Spinal stenosis, lumbar region: Secondary | ICD-10-CM | POA: Diagnosis not present

## 2015-07-19 DIAGNOSIS — I5022 Chronic systolic (congestive) heart failure: Secondary | ICD-10-CM | POA: Diagnosis not present

## 2015-07-19 DIAGNOSIS — E559 Vitamin D deficiency, unspecified: Secondary | ICD-10-CM | POA: Diagnosis not present

## 2015-07-19 DIAGNOSIS — E039 Hypothyroidism, unspecified: Secondary | ICD-10-CM | POA: Diagnosis not present

## 2015-07-19 DIAGNOSIS — K219 Gastro-esophageal reflux disease without esophagitis: Secondary | ICD-10-CM | POA: Diagnosis not present

## 2015-07-19 DIAGNOSIS — Z0000 Encounter for general adult medical examination without abnormal findings: Secondary | ICD-10-CM | POA: Diagnosis not present

## 2015-07-22 DIAGNOSIS — M1991 Primary osteoarthritis, unspecified site: Secondary | ICD-10-CM | POA: Diagnosis not present

## 2015-07-22 DIAGNOSIS — M4806 Spinal stenosis, lumbar region: Secondary | ICD-10-CM | POA: Diagnosis not present

## 2015-07-22 DIAGNOSIS — Z471 Aftercare following joint replacement surgery: Secondary | ICD-10-CM | POA: Diagnosis not present

## 2015-07-22 DIAGNOSIS — I5042 Chronic combined systolic (congestive) and diastolic (congestive) heart failure: Secondary | ICD-10-CM | POA: Diagnosis not present

## 2015-07-22 DIAGNOSIS — E7800 Pure hypercholesterolemia, unspecified: Secondary | ICD-10-CM | POA: Diagnosis not present

## 2015-07-22 DIAGNOSIS — K219 Gastro-esophageal reflux disease without esophagitis: Secondary | ICD-10-CM | POA: Diagnosis not present

## 2015-07-22 DIAGNOSIS — E039 Hypothyroidism, unspecified: Secondary | ICD-10-CM | POA: Diagnosis not present

## 2015-07-22 DIAGNOSIS — I110 Hypertensive heart disease with heart failure: Secondary | ICD-10-CM | POA: Diagnosis not present

## 2015-07-22 DIAGNOSIS — R69 Illness, unspecified: Secondary | ICD-10-CM | POA: Diagnosis not present

## 2015-07-22 DIAGNOSIS — E559 Vitamin D deficiency, unspecified: Secondary | ICD-10-CM | POA: Diagnosis not present

## 2015-07-22 NOTE — Anesthesia Postprocedure Evaluation (Signed)
Anesthesia Post Note  Patient: Heather Smith  Procedure(s) Performed: Procedure(s) (LRB): LEFT TOTAL KNEE ARTHROPLASTY (Left)  Patient location during evaluation: PACU Anesthesia Type: General Level of consciousness: sedated Pain management: satisfactory to patient Vital Signs Assessment: post-procedure vital signs reviewed and stable Respiratory status: spontaneous breathing Cardiovascular status: stable Anesthetic complications: no    Last Vitals:  Filed Vitals:   07/18/15 0427 07/18/15 1300  BP: 113/54 118/60  Pulse: 75 78  Temp: 37.2 C 36.9 C  Resp: 16 16    Last Pain:  Filed Vitals:   07/18/15 1431  PainSc: Asleep                 Tanji Storrs EDWARD

## 2015-07-23 ENCOUNTER — Encounter (HOSPITAL_COMMUNITY): Payer: Self-pay

## 2015-07-24 DIAGNOSIS — Z471 Aftercare following joint replacement surgery: Secondary | ICD-10-CM | POA: Diagnosis not present

## 2015-07-24 DIAGNOSIS — I110 Hypertensive heart disease with heart failure: Secondary | ICD-10-CM | POA: Diagnosis not present

## 2015-07-24 DIAGNOSIS — E7800 Pure hypercholesterolemia, unspecified: Secondary | ICD-10-CM | POA: Diagnosis not present

## 2015-07-24 DIAGNOSIS — E559 Vitamin D deficiency, unspecified: Secondary | ICD-10-CM | POA: Diagnosis not present

## 2015-07-24 DIAGNOSIS — M4806 Spinal stenosis, lumbar region: Secondary | ICD-10-CM | POA: Diagnosis not present

## 2015-07-24 DIAGNOSIS — E039 Hypothyroidism, unspecified: Secondary | ICD-10-CM | POA: Diagnosis not present

## 2015-07-24 DIAGNOSIS — M1991 Primary osteoarthritis, unspecified site: Secondary | ICD-10-CM | POA: Diagnosis not present

## 2015-07-24 DIAGNOSIS — K219 Gastro-esophageal reflux disease without esophagitis: Secondary | ICD-10-CM | POA: Diagnosis not present

## 2015-07-24 DIAGNOSIS — R69 Illness, unspecified: Secondary | ICD-10-CM | POA: Diagnosis not present

## 2015-07-24 DIAGNOSIS — I5042 Chronic combined systolic (congestive) and diastolic (congestive) heart failure: Secondary | ICD-10-CM | POA: Diagnosis not present

## 2015-07-25 ENCOUNTER — Encounter (HOSPITAL_COMMUNITY): Payer: Self-pay

## 2015-07-26 ENCOUNTER — Other Ambulatory Visit: Payer: Self-pay | Admitting: Orthopedic Surgery

## 2015-07-26 ENCOUNTER — Encounter: Payer: Self-pay | Admitting: Cardiology

## 2015-07-26 ENCOUNTER — Ambulatory Visit (HOSPITAL_COMMUNITY)
Admission: RE | Admit: 2015-07-26 | Discharge: 2015-07-26 | Disposition: A | Discharge status: Home / Self Care | Payer: Medicare HMO | Source: Ambulatory Visit | Attending: Orthopedic Surgery | Admitting: Orthopedic Surgery

## 2015-07-26 DIAGNOSIS — E7800 Pure hypercholesterolemia, unspecified: Secondary | ICD-10-CM | POA: Insufficient documentation

## 2015-07-26 DIAGNOSIS — M79605 Pain in left leg: Secondary | ICD-10-CM | POA: Insufficient documentation

## 2015-07-26 DIAGNOSIS — I5022 Chronic systolic (congestive) heart failure: Secondary | ICD-10-CM | POA: Diagnosis not present

## 2015-07-26 DIAGNOSIS — I447 Left bundle-branch block, unspecified: Secondary | ICD-10-CM | POA: Diagnosis not present

## 2015-07-26 DIAGNOSIS — E039 Hypothyroidism, unspecified: Secondary | ICD-10-CM | POA: Diagnosis not present

## 2015-07-26 DIAGNOSIS — I5042 Chronic combined systolic (congestive) and diastolic (congestive) heart failure: Secondary | ICD-10-CM | POA: Diagnosis not present

## 2015-07-26 DIAGNOSIS — Z471 Aftercare following joint replacement surgery: Secondary | ICD-10-CM | POA: Diagnosis not present

## 2015-07-26 DIAGNOSIS — R609 Edema, unspecified: Secondary | ICD-10-CM

## 2015-07-26 DIAGNOSIS — M1991 Primary osteoarthritis, unspecified site: Secondary | ICD-10-CM | POA: Diagnosis not present

## 2015-07-26 DIAGNOSIS — I110 Hypertensive heart disease with heart failure: Secondary | ICD-10-CM | POA: Insufficient documentation

## 2015-07-26 DIAGNOSIS — M7989 Other specified soft tissue disorders: Secondary | ICD-10-CM | POA: Diagnosis not present

## 2015-07-26 DIAGNOSIS — M1712 Unilateral primary osteoarthritis, left knee: Secondary | ICD-10-CM | POA: Diagnosis not present

## 2015-07-26 DIAGNOSIS — R69 Illness, unspecified: Secondary | ICD-10-CM | POA: Diagnosis not present

## 2015-07-26 DIAGNOSIS — E559 Vitamin D deficiency, unspecified: Secondary | ICD-10-CM | POA: Diagnosis not present

## 2015-07-26 DIAGNOSIS — K219 Gastro-esophageal reflux disease without esophagitis: Secondary | ICD-10-CM | POA: Diagnosis not present

## 2015-07-26 DIAGNOSIS — M4806 Spinal stenosis, lumbar region: Secondary | ICD-10-CM | POA: Diagnosis not present

## 2015-07-26 NOTE — Progress Notes (Addendum)
*  PRELIMINARY RESULTS* Vascular Ultrasound Left lower extremity venous duplex has been completed.  Preliminary findings: No evidence of DVT or baker's cyst.  Called results to [facility]. Pt ok to leave.   Landry Mellow, RDMS, RVT  07/26/2015, 4:03 PM

## 2015-07-29 DIAGNOSIS — I5042 Chronic combined systolic (congestive) and diastolic (congestive) heart failure: Secondary | ICD-10-CM | POA: Diagnosis not present

## 2015-07-29 DIAGNOSIS — Z471 Aftercare following joint replacement surgery: Secondary | ICD-10-CM | POA: Diagnosis not present

## 2015-07-29 DIAGNOSIS — M1991 Primary osteoarthritis, unspecified site: Secondary | ICD-10-CM | POA: Diagnosis not present

## 2015-07-29 DIAGNOSIS — E7800 Pure hypercholesterolemia, unspecified: Secondary | ICD-10-CM | POA: Diagnosis not present

## 2015-07-29 DIAGNOSIS — E039 Hypothyroidism, unspecified: Secondary | ICD-10-CM | POA: Diagnosis not present

## 2015-07-29 DIAGNOSIS — I110 Hypertensive heart disease with heart failure: Secondary | ICD-10-CM | POA: Diagnosis not present

## 2015-07-29 DIAGNOSIS — K219 Gastro-esophageal reflux disease without esophagitis: Secondary | ICD-10-CM | POA: Diagnosis not present

## 2015-07-29 DIAGNOSIS — E559 Vitamin D deficiency, unspecified: Secondary | ICD-10-CM | POA: Diagnosis not present

## 2015-07-29 DIAGNOSIS — R69 Illness, unspecified: Secondary | ICD-10-CM | POA: Diagnosis not present

## 2015-07-29 DIAGNOSIS — M4806 Spinal stenosis, lumbar region: Secondary | ICD-10-CM | POA: Diagnosis not present

## 2015-07-30 ENCOUNTER — Encounter (HOSPITAL_COMMUNITY): Payer: Self-pay

## 2015-07-31 DIAGNOSIS — E039 Hypothyroidism, unspecified: Secondary | ICD-10-CM | POA: Diagnosis not present

## 2015-07-31 DIAGNOSIS — K219 Gastro-esophageal reflux disease without esophagitis: Secondary | ICD-10-CM | POA: Diagnosis not present

## 2015-07-31 DIAGNOSIS — I110 Hypertensive heart disease with heart failure: Secondary | ICD-10-CM | POA: Diagnosis not present

## 2015-07-31 DIAGNOSIS — I5042 Chronic combined systolic (congestive) and diastolic (congestive) heart failure: Secondary | ICD-10-CM | POA: Diagnosis not present

## 2015-07-31 DIAGNOSIS — E559 Vitamin D deficiency, unspecified: Secondary | ICD-10-CM | POA: Diagnosis not present

## 2015-07-31 DIAGNOSIS — R69 Illness, unspecified: Secondary | ICD-10-CM | POA: Diagnosis not present

## 2015-07-31 DIAGNOSIS — M1991 Primary osteoarthritis, unspecified site: Secondary | ICD-10-CM | POA: Diagnosis not present

## 2015-07-31 DIAGNOSIS — M4806 Spinal stenosis, lumbar region: Secondary | ICD-10-CM | POA: Diagnosis not present

## 2015-07-31 DIAGNOSIS — Z471 Aftercare following joint replacement surgery: Secondary | ICD-10-CM | POA: Diagnosis not present

## 2015-07-31 DIAGNOSIS — E7800 Pure hypercholesterolemia, unspecified: Secondary | ICD-10-CM | POA: Diagnosis not present

## 2015-08-01 ENCOUNTER — Encounter (HOSPITAL_COMMUNITY): Payer: Self-pay

## 2015-08-02 DIAGNOSIS — M4806 Spinal stenosis, lumbar region: Secondary | ICD-10-CM | POA: Diagnosis not present

## 2015-08-02 DIAGNOSIS — Z471 Aftercare following joint replacement surgery: Secondary | ICD-10-CM | POA: Diagnosis not present

## 2015-08-02 DIAGNOSIS — I110 Hypertensive heart disease with heart failure: Secondary | ICD-10-CM | POA: Diagnosis not present

## 2015-08-02 DIAGNOSIS — E559 Vitamin D deficiency, unspecified: Secondary | ICD-10-CM | POA: Diagnosis not present

## 2015-08-02 DIAGNOSIS — I5042 Chronic combined systolic (congestive) and diastolic (congestive) heart failure: Secondary | ICD-10-CM | POA: Diagnosis not present

## 2015-08-02 DIAGNOSIS — K219 Gastro-esophageal reflux disease without esophagitis: Secondary | ICD-10-CM | POA: Diagnosis not present

## 2015-08-02 DIAGNOSIS — E039 Hypothyroidism, unspecified: Secondary | ICD-10-CM | POA: Diagnosis not present

## 2015-08-02 DIAGNOSIS — R69 Illness, unspecified: Secondary | ICD-10-CM | POA: Diagnosis not present

## 2015-08-02 DIAGNOSIS — E7800 Pure hypercholesterolemia, unspecified: Secondary | ICD-10-CM | POA: Diagnosis not present

## 2015-08-02 DIAGNOSIS — M1991 Primary osteoarthritis, unspecified site: Secondary | ICD-10-CM | POA: Diagnosis not present

## 2015-08-05 DIAGNOSIS — I110 Hypertensive heart disease with heart failure: Secondary | ICD-10-CM | POA: Diagnosis not present

## 2015-08-05 DIAGNOSIS — E039 Hypothyroidism, unspecified: Secondary | ICD-10-CM | POA: Diagnosis not present

## 2015-08-05 DIAGNOSIS — Z471 Aftercare following joint replacement surgery: Secondary | ICD-10-CM | POA: Diagnosis not present

## 2015-08-05 DIAGNOSIS — M1991 Primary osteoarthritis, unspecified site: Secondary | ICD-10-CM | POA: Diagnosis not present

## 2015-08-05 DIAGNOSIS — K219 Gastro-esophageal reflux disease without esophagitis: Secondary | ICD-10-CM | POA: Diagnosis not present

## 2015-08-05 DIAGNOSIS — E7800 Pure hypercholesterolemia, unspecified: Secondary | ICD-10-CM | POA: Diagnosis not present

## 2015-08-05 DIAGNOSIS — R69 Illness, unspecified: Secondary | ICD-10-CM | POA: Diagnosis not present

## 2015-08-05 DIAGNOSIS — E559 Vitamin D deficiency, unspecified: Secondary | ICD-10-CM | POA: Diagnosis not present

## 2015-08-05 DIAGNOSIS — I5042 Chronic combined systolic (congestive) and diastolic (congestive) heart failure: Secondary | ICD-10-CM | POA: Diagnosis not present

## 2015-08-05 DIAGNOSIS — M4806 Spinal stenosis, lumbar region: Secondary | ICD-10-CM | POA: Diagnosis not present

## 2015-08-06 ENCOUNTER — Encounter (HOSPITAL_COMMUNITY): Payer: Self-pay

## 2015-08-07 DIAGNOSIS — K219 Gastro-esophageal reflux disease without esophagitis: Secondary | ICD-10-CM | POA: Diagnosis not present

## 2015-08-07 DIAGNOSIS — M4806 Spinal stenosis, lumbar region: Secondary | ICD-10-CM | POA: Diagnosis not present

## 2015-08-07 DIAGNOSIS — Z471 Aftercare following joint replacement surgery: Secondary | ICD-10-CM | POA: Diagnosis not present

## 2015-08-07 DIAGNOSIS — E039 Hypothyroidism, unspecified: Secondary | ICD-10-CM | POA: Diagnosis not present

## 2015-08-07 DIAGNOSIS — M1991 Primary osteoarthritis, unspecified site: Secondary | ICD-10-CM | POA: Diagnosis not present

## 2015-08-07 DIAGNOSIS — R69 Illness, unspecified: Secondary | ICD-10-CM | POA: Diagnosis not present

## 2015-08-07 DIAGNOSIS — I5042 Chronic combined systolic (congestive) and diastolic (congestive) heart failure: Secondary | ICD-10-CM | POA: Diagnosis not present

## 2015-08-07 DIAGNOSIS — E559 Vitamin D deficiency, unspecified: Secondary | ICD-10-CM | POA: Diagnosis not present

## 2015-08-07 DIAGNOSIS — I110 Hypertensive heart disease with heart failure: Secondary | ICD-10-CM | POA: Diagnosis not present

## 2015-08-07 DIAGNOSIS — E7800 Pure hypercholesterolemia, unspecified: Secondary | ICD-10-CM | POA: Diagnosis not present

## 2015-08-08 ENCOUNTER — Encounter (HOSPITAL_COMMUNITY): Payer: Self-pay

## 2015-08-09 DIAGNOSIS — K219 Gastro-esophageal reflux disease without esophagitis: Secondary | ICD-10-CM | POA: Diagnosis not present

## 2015-08-09 DIAGNOSIS — E7800 Pure hypercholesterolemia, unspecified: Secondary | ICD-10-CM | POA: Diagnosis not present

## 2015-08-09 DIAGNOSIS — M1712 Unilateral primary osteoarthritis, left knee: Secondary | ICD-10-CM | POA: Diagnosis not present

## 2015-08-09 DIAGNOSIS — E559 Vitamin D deficiency, unspecified: Secondary | ICD-10-CM | POA: Diagnosis not present

## 2015-08-09 DIAGNOSIS — M4806 Spinal stenosis, lumbar region: Secondary | ICD-10-CM | POA: Diagnosis not present

## 2015-08-09 DIAGNOSIS — R69 Illness, unspecified: Secondary | ICD-10-CM | POA: Diagnosis not present

## 2015-08-09 DIAGNOSIS — E039 Hypothyroidism, unspecified: Secondary | ICD-10-CM | POA: Diagnosis not present

## 2015-08-09 DIAGNOSIS — I110 Hypertensive heart disease with heart failure: Secondary | ICD-10-CM | POA: Diagnosis not present

## 2015-08-09 DIAGNOSIS — Z471 Aftercare following joint replacement surgery: Secondary | ICD-10-CM | POA: Diagnosis not present

## 2015-08-09 DIAGNOSIS — M1991 Primary osteoarthritis, unspecified site: Secondary | ICD-10-CM | POA: Diagnosis not present

## 2015-08-09 DIAGNOSIS — I5042 Chronic combined systolic (congestive) and diastolic (congestive) heart failure: Secondary | ICD-10-CM | POA: Diagnosis not present

## 2015-08-13 ENCOUNTER — Encounter (HOSPITAL_COMMUNITY): Payer: Self-pay

## 2015-08-15 ENCOUNTER — Encounter (HOSPITAL_COMMUNITY): Payer: Self-pay

## 2015-08-20 ENCOUNTER — Encounter (HOSPITAL_COMMUNITY): Payer: Self-pay

## 2015-08-22 ENCOUNTER — Encounter (HOSPITAL_COMMUNITY): Payer: Self-pay

## 2015-08-27 ENCOUNTER — Encounter (HOSPITAL_COMMUNITY): Payer: Self-pay

## 2015-08-27 DIAGNOSIS — E782 Mixed hyperlipidemia: Secondary | ICD-10-CM | POA: Diagnosis not present

## 2015-08-27 DIAGNOSIS — R7309 Other abnormal glucose: Secondary | ICD-10-CM | POA: Diagnosis not present

## 2015-08-27 DIAGNOSIS — I10 Essential (primary) hypertension: Secondary | ICD-10-CM | POA: Diagnosis not present

## 2015-08-29 ENCOUNTER — Encounter (HOSPITAL_COMMUNITY): Payer: Self-pay

## 2015-09-02 ENCOUNTER — Ambulatory Visit: Payer: Medicare HMO | Admitting: Podiatry

## 2015-09-03 ENCOUNTER — Encounter (HOSPITAL_COMMUNITY): Payer: Self-pay

## 2015-09-04 ENCOUNTER — Ambulatory Visit (INDEPENDENT_AMBULATORY_CARE_PROVIDER_SITE_OTHER): Payer: Medicare HMO | Admitting: *Deleted

## 2015-09-04 DIAGNOSIS — I428 Other cardiomyopathies: Secondary | ICD-10-CM

## 2015-09-04 DIAGNOSIS — I5022 Chronic systolic (congestive) heart failure: Secondary | ICD-10-CM | POA: Diagnosis not present

## 2015-09-04 NOTE — Progress Notes (Signed)
Remote ICD transmission.   

## 2015-09-05 ENCOUNTER — Encounter (HOSPITAL_COMMUNITY): Payer: Self-pay

## 2015-09-05 ENCOUNTER — Encounter: Payer: Self-pay | Admitting: Podiatry

## 2015-09-05 ENCOUNTER — Ambulatory Visit (INDEPENDENT_AMBULATORY_CARE_PROVIDER_SITE_OTHER): Payer: Medicare HMO | Admitting: Podiatry

## 2015-09-05 VITALS — BP 126/72 | HR 64 | Resp 16

## 2015-09-05 DIAGNOSIS — H02403 Unspecified ptosis of bilateral eyelids: Secondary | ICD-10-CM | POA: Diagnosis not present

## 2015-09-05 DIAGNOSIS — H04123 Dry eye syndrome of bilateral lacrimal glands: Secondary | ICD-10-CM | POA: Diagnosis not present

## 2015-09-05 DIAGNOSIS — M7661 Achilles tendinitis, right leg: Secondary | ICD-10-CM

## 2015-09-05 DIAGNOSIS — H2513 Age-related nuclear cataract, bilateral: Secondary | ICD-10-CM | POA: Diagnosis not present

## 2015-09-05 NOTE — Progress Notes (Signed)
Subjective:     Patient ID: Heather Smith, female   DOB: 1932-12-07, 82 y.o.   MRN: [id number]  HPI patient states that the back of the right heel has been bothering her and that she can't do anything intensive or surgical because she just had her left knee replaced   Review of Systems     Objective:   Physical Exam Neurovascular status intact muscle strength adequate with exquisite discomfort on the medial side of the posterior heel right with inflammation and fluid buildup around the insertion into the calcaneus. It is quite inflamed around this area and also involves the central portion of the tendon    Assessment:     Achilles tendinitis posterior medial heel with inflammation fluid buildup    Plan:     H&P and spent a great of time discussing options with patient. We discussed conservative and surgical and at this point we decided on shockwave therapy which was initiated today. Patient will present 1 week for second shockwave therapy

## 2015-09-10 ENCOUNTER — Encounter (HOSPITAL_COMMUNITY): Payer: Self-pay

## 2015-09-11 ENCOUNTER — Encounter: Payer: Self-pay | Admitting: Cardiology

## 2015-09-11 DIAGNOSIS — R300 Dysuria: Secondary | ICD-10-CM | POA: Diagnosis not present

## 2015-09-12 ENCOUNTER — Ambulatory Visit (INDEPENDENT_AMBULATORY_CARE_PROVIDER_SITE_OTHER): Payer: Medicare HMO

## 2015-09-12 ENCOUNTER — Encounter (HOSPITAL_COMMUNITY): Payer: Self-pay

## 2015-09-12 DIAGNOSIS — M7661 Achilles tendinitis, right leg: Secondary | ICD-10-CM

## 2015-09-12 NOTE — Progress Notes (Signed)
   Subjective:    Patient ID: Heather Smith, female    DOB: 04-04-1932, 82 y.o.   MRN: [id number]  HPI Pt presents stating that the pain in her right heel has improved a lot since her last visit  Review of Systems    All other systems are negative Objective:   Physical Exam       Pain on palpation of medial achilles, close to insertion site 6 of 10 Assessment & Plan:  ESWT therapy administered to right medial and posterior achilles at 10 joules for 3000 pulses tolerated well, EPAt delivered to surrounding connective tissue for 3000 pulses

## 2015-09-17 ENCOUNTER — Encounter (HOSPITAL_COMMUNITY): Payer: Self-pay

## 2015-09-18 ENCOUNTER — Telehealth: Payer: Self-pay | Admitting: Cardiology

## 2015-09-18 ENCOUNTER — Telehealth (HOSPITAL_COMMUNITY): Payer: Self-pay | Admitting: Vascular Surgery

## 2015-09-18 NOTE — Telephone Encounter (Signed)
Cardiac and pulm rehab called Pt would like to get back in to pulm rehab post knee surgery, pt needs order sent to pulm rehab please advise

## 2015-09-18 NOTE — Telephone Encounter (Signed)
New message  Lattie Haw from [facility] pulmonary call to get an order put in to continue pt in maintenance pul rehab. Please call back to discuss if needed

## 2015-09-18 NOTE — Telephone Encounter (Signed)
See other phone note

## 2015-09-18 NOTE — Telephone Encounter (Signed)
Called cardiac rehab, Lattie Haw is gone for today, they will have her call us in AM

## 2015-09-19 ENCOUNTER — Encounter (HOSPITAL_COMMUNITY): Payer: Self-pay

## 2015-09-19 ENCOUNTER — Ambulatory Visit (INDEPENDENT_AMBULATORY_CARE_PROVIDER_SITE_OTHER): Payer: Medicare HMO

## 2015-09-19 ENCOUNTER — Other Ambulatory Visit: Payer: Medicare HMO

## 2015-09-19 DIAGNOSIS — M7661 Achilles tendinitis, right leg: Secondary | ICD-10-CM

## 2015-09-19 DIAGNOSIS — M1712 Unilateral primary osteoarthritis, left knee: Secondary | ICD-10-CM | POA: Diagnosis not present

## 2015-09-19 NOTE — Progress Notes (Signed)
   Subjective:    Patient ID: Heather Smith, female    DOB: July 12, 1932, 82 y.o.   MRN: [id number]  HPI Pt presents stating that the pain in her right heel has improved a lot since her last visit, soreness episodes are becoming less frequent  Review of Systems    All other systems are negative Objective:   Physical Exam       Pain on palpation of medial achilles, close to insertion site 6 of 10 Assessment & Plan:  ESWT therapy administered to right medial and posterior achilles at 15 joules for 3000 pulses tolerated well, re-appointed in 4 weeks for re-evaluation.

## 2015-09-20 LAB — CUP PACEART REMOTE DEVICE CHECK
Battery Remaining Longevity: 32 mo
Brady Statistic AP VS Percent: 0.81 %
Brady Statistic AS VS Percent: 1.22 %
Brady Statistic RV Percent Paced: 89.2 %
HighPow Impedance: 70 Ohm
Implantable Lead Implant Date: 20150121
Implantable Lead Location: 753858
Implantable Lead Location: 753859
Implantable Lead Model: 4298
Lead Channel Impedance Value: 304 Ohm
Lead Channel Impedance Value: 342 Ohm
Lead Channel Impedance Value: 399 Ohm
Lead Channel Impedance Value: 570 Ohm
Lead Channel Impedance Value: 589 Ohm
Lead Channel Impedance Value: 646 Ohm
Lead Channel Pacing Threshold Amplitude: 0.75 V
Lead Channel Pacing Threshold Pulse Width: 0.4 ms
Lead Channel Pacing Threshold Pulse Width: 0.7 ms
Lead Channel Sensing Intrinsic Amplitude: 15.75 mV
Lead Channel Sensing Intrinsic Amplitude: 15.75 mV
Lead Channel Sensing Intrinsic Amplitude: 2.625 mV
Lead Channel Setting Pacing Amplitude: 1.5 V
Lead Channel Setting Pacing Amplitude: 2.25 V
Lead Channel Setting Pacing Pulse Width: 0.4 ms
Lead Channel Setting Pacing Pulse Width: 0.7 ms
MDC IDC LEAD IMPLANT DT: 20150121
MDC IDC LEAD IMPLANT DT: 20150121
MDC IDC LEAD LOCATION: 753860
MDC IDC MSMT BATTERY VOLTAGE: 2.95 V
MDC IDC MSMT LEADCHNL LV IMPEDANCE VALUE: 323 Ohm
MDC IDC MSMT LEADCHNL LV IMPEDANCE VALUE: 342 Ohm
MDC IDC MSMT LEADCHNL LV IMPEDANCE VALUE: 513 Ohm
MDC IDC MSMT LEADCHNL LV IMPEDANCE VALUE: 532 Ohm
MDC IDC MSMT LEADCHNL LV IMPEDANCE VALUE: 570 Ohm
MDC IDC MSMT LEADCHNL LV PACING THRESHOLD AMPLITUDE: 1.25 V
MDC IDC MSMT LEADCHNL RA IMPEDANCE VALUE: 399 Ohm
MDC IDC MSMT LEADCHNL RA SENSING INTR AMPL: 2.625 mV
MDC IDC MSMT LEADCHNL RV IMPEDANCE VALUE: 570 Ohm
MDC IDC MSMT LEADCHNL RV PACING THRESHOLD AMPLITUDE: 0.5 V
MDC IDC MSMT LEADCHNL RV PACING THRESHOLD PULSEWIDTH: 0.4 ms
MDC IDC SESS DTM: 20170802041703
MDC IDC SET LEADCHNL RV PACING AMPLITUDE: 2 V
MDC IDC SET LEADCHNL RV SENSING SENSITIVITY: 0.3 mV
MDC IDC STAT BRADY AP VP PERCENT: 45.22 %
MDC IDC STAT BRADY AS VP PERCENT: 52.75 %
MDC IDC STAT BRADY RA PERCENT PACED: 46.03 %

## 2015-09-20 NOTE — Telephone Encounter (Signed)
Per Dr Aundra Dubin ok to resume pulm rehab, note faxed to them

## 2015-09-23 DIAGNOSIS — R7303 Prediabetes: Secondary | ICD-10-CM | POA: Diagnosis not present

## 2015-09-23 DIAGNOSIS — I10 Essential (primary) hypertension: Secondary | ICD-10-CM | POA: Diagnosis not present

## 2015-09-23 DIAGNOSIS — N183 Chronic kidney disease, stage 3 (moderate): Secondary | ICD-10-CM | POA: Diagnosis not present

## 2015-09-23 DIAGNOSIS — E039 Hypothyroidism, unspecified: Secondary | ICD-10-CM | POA: Diagnosis not present

## 2015-09-23 DIAGNOSIS — I5020 Unspecified systolic (congestive) heart failure: Secondary | ICD-10-CM | POA: Diagnosis not present

## 2015-09-24 ENCOUNTER — Encounter (HOSPITAL_COMMUNITY): Payer: Self-pay

## 2015-09-24 ENCOUNTER — Other Ambulatory Visit (HOSPITAL_COMMUNITY): Payer: Self-pay

## 2015-09-24 DIAGNOSIS — I509 Heart failure, unspecified: Secondary | ICD-10-CM

## 2015-09-26 ENCOUNTER — Encounter: Payer: Self-pay | Admitting: Cardiology

## 2015-09-26 ENCOUNTER — Encounter (HOSPITAL_COMMUNITY)
Admission: RE | Admit: 2015-09-26 | Discharge: 2015-09-26 | Disposition: A | Discharge status: Home / Self Care | Payer: Self-pay | Source: Ambulatory Visit | Attending: Cardiology | Admitting: Cardiology

## 2015-09-26 ENCOUNTER — Encounter (HOSPITAL_COMMUNITY): Payer: Self-pay

## 2015-09-26 DIAGNOSIS — R42 Dizziness and giddiness: Secondary | ICD-10-CM | POA: Insufficient documentation

## 2015-09-26 DIAGNOSIS — H9313 Tinnitus, bilateral: Secondary | ICD-10-CM | POA: Insufficient documentation

## 2015-09-26 DIAGNOSIS — I499 Cardiac arrhythmia, unspecified: Secondary | ICD-10-CM | POA: Insufficient documentation

## 2015-09-26 DIAGNOSIS — H9113 Presbycusis, bilateral: Secondary | ICD-10-CM | POA: Insufficient documentation

## 2015-09-26 DIAGNOSIS — H903 Sensorineural hearing loss, bilateral: Secondary | ICD-10-CM | POA: Diagnosis not present

## 2015-09-26 DIAGNOSIS — I509 Heart failure, unspecified: Secondary | ICD-10-CM | POA: Insufficient documentation

## 2015-09-27 ENCOUNTER — Encounter: Payer: Self-pay | Admitting: Cardiology

## 2015-10-01 ENCOUNTER — Encounter (HOSPITAL_COMMUNITY)
Admission: RE | Admit: 2015-10-01 | Discharge: 2015-10-01 | Disposition: A | Discharge status: Home / Self Care | Payer: Self-pay | Source: Ambulatory Visit | Attending: Cardiology | Admitting: Cardiology

## 2015-10-01 ENCOUNTER — Encounter (HOSPITAL_COMMUNITY): Payer: Self-pay

## 2015-10-01 NOTE — Progress Notes (Signed)
Heather Smith restarted Pulmonary Maintenance program 09/26/15. She is tolerating exercise well without c/o. Heather Smith is scheduled to attend the Tuesday and Thursday classes and is attending regularly.

## 2015-10-03 ENCOUNTER — Encounter (HOSPITAL_COMMUNITY): Payer: Self-pay

## 2015-10-03 ENCOUNTER — Encounter (HOSPITAL_COMMUNITY)
Admission: RE | Admit: 2015-10-03 | Discharge: 2015-10-03 | Disposition: A | Discharge status: Home / Self Care | Payer: Self-pay | Source: Ambulatory Visit | Attending: Cardiology | Admitting: Cardiology

## 2015-10-04 ENCOUNTER — Encounter: Payer: Self-pay | Admitting: Cardiology

## 2015-10-08 ENCOUNTER — Encounter (HOSPITAL_COMMUNITY): Payer: Self-pay

## 2015-10-08 ENCOUNTER — Encounter (HOSPITAL_COMMUNITY)
Admission: RE | Admit: 2015-10-08 | Discharge: 2015-10-08 | Disposition: A | Discharge status: Home / Self Care | Payer: Self-pay | Source: Ambulatory Visit | Attending: Internal Medicine | Admitting: Internal Medicine

## 2015-10-08 DIAGNOSIS — I509 Heart failure, unspecified: Secondary | ICD-10-CM | POA: Insufficient documentation

## 2015-10-10 ENCOUNTER — Encounter (HOSPITAL_COMMUNITY): Payer: Self-pay

## 2015-10-10 ENCOUNTER — Encounter (HOSPITAL_COMMUNITY)
Admission: RE | Admit: 2015-10-10 | Discharge: 2015-10-10 | Disposition: A | Discharge status: Home / Self Care | Payer: Self-pay | Source: Ambulatory Visit | Attending: Internal Medicine | Admitting: Internal Medicine

## 2015-10-15 ENCOUNTER — Encounter (HOSPITAL_COMMUNITY)
Admission: RE | Admit: 2015-10-15 | Discharge: 2015-10-15 | Disposition: A | Discharge status: Home / Self Care | Payer: Self-pay | Source: Ambulatory Visit | Attending: Internal Medicine | Admitting: Internal Medicine

## 2015-10-15 ENCOUNTER — Encounter (HOSPITAL_COMMUNITY): Payer: Self-pay

## 2015-10-17 ENCOUNTER — Encounter (HOSPITAL_COMMUNITY): Payer: Self-pay

## 2015-10-17 ENCOUNTER — Encounter (HOSPITAL_COMMUNITY)
Admission: RE | Admit: 2015-10-17 | Discharge: 2015-10-17 | Disposition: A | Discharge status: Home / Self Care | Payer: Self-pay | Source: Ambulatory Visit | Attending: Cardiology | Admitting: Cardiology

## 2015-10-17 ENCOUNTER — Ambulatory Visit: Payer: Medicare HMO

## 2015-10-17 DIAGNOSIS — M7661 Achilles tendinitis, right leg: Secondary | ICD-10-CM

## 2015-10-17 NOTE — Progress Notes (Signed)
   Subjective:    Patient ID: Heather Smith, female    DOB: 23-May-1932, 82 y.o.   MRN: [id number]  HPI Pt presents stating that the pain in her right heel has improved some but is still not 100% better Review of Systems    All other systems are negative Objective:   Physical Exam       Pain on palpation of medial achilles, close to insertion site 8 of 10 Assessment & Plan:  ESWT therapy administered to right medial and posterior achilles at 12 joules for 3000 pulses tolerated well, re-appointed in 6 weeks to be re-evaluated by the doctor.

## 2015-10-19 ENCOUNTER — Other Ambulatory Visit (HOSPITAL_COMMUNITY): Payer: Self-pay | Admitting: Cardiology

## 2015-10-21 ENCOUNTER — Other Ambulatory Visit (HOSPITAL_COMMUNITY): Payer: Self-pay | Admitting: Cardiology

## 2015-10-22 ENCOUNTER — Encounter (HOSPITAL_COMMUNITY)
Admission: RE | Admit: 2015-10-22 | Discharge: 2015-10-22 | Disposition: A | Discharge status: Home / Self Care | Payer: Self-pay | Source: Ambulatory Visit | Attending: Internal Medicine | Admitting: Internal Medicine

## 2015-10-22 ENCOUNTER — Encounter (HOSPITAL_COMMUNITY): Payer: Self-pay

## 2015-10-24 ENCOUNTER — Encounter (HOSPITAL_COMMUNITY)
Admission: RE | Admit: 2015-10-24 | Discharge: 2015-10-24 | Disposition: A | Discharge status: Home / Self Care | Payer: Self-pay | Source: Ambulatory Visit | Attending: Internal Medicine | Admitting: Internal Medicine

## 2015-10-24 ENCOUNTER — Encounter (HOSPITAL_COMMUNITY): Payer: Self-pay

## 2015-10-29 ENCOUNTER — Encounter (HOSPITAL_COMMUNITY)
Admission: RE | Admit: 2015-10-29 | Discharge: 2015-10-29 | Disposition: A | Discharge status: Home / Self Care | Payer: Self-pay | Source: Ambulatory Visit | Attending: Cardiovascular Disease | Admitting: Cardiovascular Disease

## 2015-10-29 ENCOUNTER — Encounter (HOSPITAL_COMMUNITY): Payer: Self-pay

## 2015-10-31 ENCOUNTER — Encounter (HOSPITAL_COMMUNITY)
Admission: RE | Admit: 2015-10-31 | Discharge: 2015-10-31 | Disposition: A | Discharge status: Home / Self Care | Payer: Self-pay | Source: Ambulatory Visit | Attending: Internal Medicine | Admitting: Internal Medicine

## 2015-10-31 ENCOUNTER — Encounter (HOSPITAL_COMMUNITY): Payer: Self-pay

## 2015-11-01 DIAGNOSIS — R195 Other fecal abnormalities: Secondary | ICD-10-CM | POA: Diagnosis not present

## 2015-11-01 DIAGNOSIS — G629 Polyneuropathy, unspecified: Secondary | ICD-10-CM | POA: Diagnosis not present

## 2015-11-01 DIAGNOSIS — Z1211 Encounter for screening for malignant neoplasm of colon: Secondary | ICD-10-CM | POA: Diagnosis not present

## 2015-11-01 DIAGNOSIS — Z23 Encounter for immunization: Secondary | ICD-10-CM | POA: Diagnosis not present

## 2015-11-05 ENCOUNTER — Encounter (HOSPITAL_COMMUNITY): Payer: Self-pay

## 2015-11-05 ENCOUNTER — Encounter (HOSPITAL_COMMUNITY)
Admission: RE | Admit: 2015-11-05 | Discharge: 2015-11-05 | Disposition: A | Discharge status: Home / Self Care | Payer: Self-pay | Source: Ambulatory Visit | Attending: Internal Medicine | Admitting: Internal Medicine

## 2015-11-05 DIAGNOSIS — I509 Heart failure, unspecified: Secondary | ICD-10-CM | POA: Insufficient documentation

## 2015-11-07 ENCOUNTER — Encounter (HOSPITAL_COMMUNITY): Payer: Self-pay

## 2015-11-07 ENCOUNTER — Encounter (HOSPITAL_COMMUNITY)
Admission: RE | Admit: 2015-11-07 | Discharge: 2015-11-07 | Disposition: A | Discharge status: Home / Self Care | Payer: Self-pay | Source: Ambulatory Visit | Attending: Internal Medicine | Admitting: Internal Medicine

## 2015-11-08 DIAGNOSIS — Z1211 Encounter for screening for malignant neoplasm of colon: Secondary | ICD-10-CM | POA: Diagnosis not present

## 2015-11-12 ENCOUNTER — Encounter (HOSPITAL_COMMUNITY)
Admission: RE | Admit: 2015-11-12 | Discharge: 2015-11-12 | Disposition: A | Discharge status: Home / Self Care | Payer: Self-pay | Source: Ambulatory Visit | Attending: Internal Medicine | Admitting: Internal Medicine

## 2015-11-12 ENCOUNTER — Encounter (HOSPITAL_COMMUNITY): Payer: Self-pay

## 2015-11-14 ENCOUNTER — Encounter (HOSPITAL_COMMUNITY): Payer: Self-pay

## 2015-11-14 ENCOUNTER — Encounter (HOSPITAL_COMMUNITY)
Admission: RE | Admit: 2015-11-14 | Discharge: 2015-11-14 | Disposition: A | Discharge status: Home / Self Care | Payer: Self-pay | Source: Ambulatory Visit | Attending: Cardiology | Admitting: Cardiology

## 2015-11-19 ENCOUNTER — Encounter (HOSPITAL_COMMUNITY): Payer: Self-pay

## 2015-11-19 ENCOUNTER — Encounter (HOSPITAL_COMMUNITY)
Admission: RE | Admit: 2015-11-19 | Discharge: 2015-11-19 | Disposition: A | Discharge status: Home / Self Care | Payer: Self-pay | Source: Ambulatory Visit | Attending: Internal Medicine | Admitting: Internal Medicine

## 2015-11-21 ENCOUNTER — Encounter (HOSPITAL_COMMUNITY)
Admission: RE | Admit: 2015-11-21 | Discharge: 2015-11-21 | Disposition: A | Discharge status: Home / Self Care | Payer: Self-pay | Source: Ambulatory Visit | Attending: Internal Medicine | Admitting: Internal Medicine

## 2015-11-26 ENCOUNTER — Ambulatory Visit (HOSPITAL_COMMUNITY)
Admission: RE | Admit: 2015-11-26 | Discharge: 2015-11-26 | Disposition: A | Discharge status: Home / Self Care | Payer: Medicare HMO | Source: Ambulatory Visit | Attending: Cardiology | Admitting: Cardiology

## 2015-11-26 ENCOUNTER — Telehealth (HOSPITAL_COMMUNITY): Payer: Self-pay | Admitting: Pharmacist

## 2015-11-26 ENCOUNTER — Encounter (HOSPITAL_COMMUNITY)
Admission: RE | Admit: 2015-11-26 | Discharge: 2015-11-26 | Disposition: A | Discharge status: Home / Self Care | Payer: Self-pay | Source: Ambulatory Visit | Attending: Internal Medicine | Admitting: Internal Medicine

## 2015-11-26 VITALS — BP 122/68 | HR 62 | Wt 187.0 lb

## 2015-11-26 DIAGNOSIS — M1710 Unilateral primary osteoarthritis, unspecified knee: Secondary | ICD-10-CM | POA: Diagnosis not present

## 2015-11-26 DIAGNOSIS — R911 Solitary pulmonary nodule: Secondary | ICD-10-CM | POA: Insufficient documentation

## 2015-11-26 DIAGNOSIS — I5022 Chronic systolic (congestive) heart failure: Secondary | ICD-10-CM | POA: Insufficient documentation

## 2015-11-26 DIAGNOSIS — E785 Hyperlipidemia, unspecified: Secondary | ICD-10-CM | POA: Diagnosis not present

## 2015-11-26 DIAGNOSIS — F329 Major depressive disorder, single episode, unspecified: Secondary | ICD-10-CM | POA: Insufficient documentation

## 2015-11-26 DIAGNOSIS — I493 Ventricular premature depolarization: Secondary | ICD-10-CM | POA: Insufficient documentation

## 2015-11-26 DIAGNOSIS — M545 Low back pain: Secondary | ICD-10-CM | POA: Diagnosis not present

## 2015-11-26 DIAGNOSIS — G629 Polyneuropathy, unspecified: Secondary | ICD-10-CM | POA: Insufficient documentation

## 2015-11-26 DIAGNOSIS — I447 Left bundle-branch block, unspecified: Secondary | ICD-10-CM | POA: Diagnosis not present

## 2015-11-26 DIAGNOSIS — I429 Cardiomyopathy, unspecified: Secondary | ICD-10-CM | POA: Diagnosis present

## 2015-11-26 DIAGNOSIS — R69 Illness, unspecified: Secondary | ICD-10-CM | POA: Diagnosis not present

## 2015-11-26 LAB — BASIC METABOLIC PANEL
Anion gap: 9 (ref 5–15)
BUN: 18 mg/dL (ref 6–20)
CHLORIDE: 106 mmol/L (ref 101–111)
CO2: 25 mmol/L (ref 22–32)
Calcium: 9.7 mg/dL (ref 8.9–10.3)
Creatinine, Ser: 1.16 mg/dL — ABNORMAL HIGH (ref 0.44–1.00)
GFR calc non Af Amer: 43 mL/min — ABNORMAL LOW (ref >60)
GFR, EST AFRICAN AMERICAN: 50 mL/min — AB (ref >60)
Glucose, Bld: 92 mg/dL (ref 65–99)
POTASSIUM: 4.9 mmol/L (ref 3.5–5.1)
SODIUM: 140 mmol/L (ref 135–145)

## 2015-11-26 LAB — CBC
HEMATOCRIT: 40.8 % (ref 36.0–46.0)
HEMOGLOBIN: 13.6 g/dL (ref 12.0–15.0)
MCH: 30.3 pg (ref 26.0–34.0)
MCHC: 33.3 g/dL (ref 30.0–36.0)
MCV: 90.9 fL (ref 78.0–100.0)
Platelets: 158 10*3/uL (ref 150–400)
RBC: 4.49 MIL/uL (ref 3.87–5.11)
RDW: 14.2 % (ref 11.5–15.5)
WBC: 6 10*3/uL (ref 4.0–10.5)

## 2015-11-26 LAB — BRAIN NATRIURETIC PEPTIDE: B NATRIURETIC PEPTIDE 5: 210.2 pg/mL — AB (ref 0.0–100.0)

## 2015-11-26 MED ORDER — SPIRONOLACTONE 25 MG PO TABS: 25.0000 mg | ORAL_TABLET | Freq: Every day | ORAL | 3 refills | Status: DC

## 2015-11-26 NOTE — Progress Notes (Signed)
Patient ID: Heather Smith, female   DOB: Apr 30, 1932, 82 y.o.   MRN: [id number] PCP: Dr. Kenton Kingfisher HF Cardiology: Aundra Dubin  82 yo with history of nonischemic cardiomyopathy and chronic systolic HF.  Patient has felt like her energy level has been low for several years.  In 9/14, she was admitted to [facility] with chest pain.  Cardiac cath was done, showing no significant coronary disease.  However, echo showed EF 30-35%.  She was started on meds for CHF, and echo was repeated in 12/14, EF was read as 20-25%.  She had a Medtonic CRT-D device placed (had baseline LBBB).  In 3/15, the LV lead was not capturing properly so device had to be reprogrammed.  She had AV optimization in 5/15.  Limited echo at that time showed improvement in EF to 40-45%.  CPX in 5/15 was submaximal but suggested low normal to mildly reduced functional capacity. Last echo in 6/16 showed EF 35-40% with diffuse hypokinesis.  CPX was done in 9/16.  This actually showed good functional capacity. She had repeat echo in 1/17, showing EF increased to 60-65%.    She has also been going to pulmonary rehab. Symptomatically, she is unchanged.  Still poor stamina, easily fatigued.  Dyspnea walking up stairs or an incline.  Generally does ok on flat ground.  No chest pain.  Marland Kitchen   Optivol: Mildly decreased impedance but overall stable without evidence for significant volume overload.   Labs (9/14): TSH normal Labs (3/15): K 4.7, creatinine 1.1 Labs (5/15): K 4.9, creatinine 1.1, BNP 44 Labs (6/15): K 5.1, creatinine 1.4, pro-BNP 39 Labs (6/15): K 5.3, creatinine 1.53 Labs (12/15): K 4.4, creatinine 1.03 Labs (5/16): K 4, creatinine 1.0, BNP 34.6 Labs (6/16): K 4.3, creatinine 1.1 Labs (9/16): K 4.4, creatinine 1.16 Labs (11/16): K 4.5, creatinine 1.16 Labs (12/16): K 4.6, creatinine 1.16, BNP 22, Mg 1.9 Labs (1/17): K 4.3, creatinine 1.34, HCT 42.4, ESR normal Labs (6/17): K 4.6, creatinine 1.23, hgb 9.9, TSH normal  PMH: 1. Chronic  systolic CHF: Nonischemic cardiomyopathy.  LHC (9/14) with no significant coronary disease, EF 25-30% with global hypokinesis.  Echo (9/14) with EF 30-35%, moderate LV dilation, diffuse hypokinesis.  Echo (12/14) with EF 20-25%.  She does not drink ETOH.  She had Medtronic CRT-D device implantation in 1/15. Echo (5/15) with EF 40-45%, mild LVH.  CPX (5/15) was submaximal with RER 0.99, peak VO2 14, VE/VCO2 37.3; low normal functional capacity may be limited by loss of BiV pacing at higher HR and by deconditioning but interpretation somewhat limited since study was submaximal.  Echo (6/16) with EF 35-40%, severe LV dilation, diffuse hypokinesis, mild MR.  CPX (9/16) with peak VO2 17.7, VE/VCO2 slope 32.3, RER 1.11 => excellent functional capacity. Echo (11/17) with EF 60-65%.  2. Back pain s/p lumbar laminectomy 3. OA: Knees. Left TKR in 6/17.  4. Hyperlipidemia 5. Depression 6. PVCs 7. Peripheral neuropathy 8. Lung nodule  SH: Lives [place], married, never smoked, no ETOH.    FH: Mother with MI at 33, possible ischemic cardiomyopathy.  Father with cardiomyopathy, died at 14.  She has 5 brothers, none with significant coronary disease.    ROS: All systems reviewed and negative except as per HPI.   Current Outpatient Prescriptions  Medication Sig Dispense Refill  . acetaminophen (TYLENOL) 650 MG CR tablet Take 650 mg by mouth every 8 (eight) hours as needed for pain.    . Ascorbic Acid (VITAMIN C) 1000 MG tablet Take 1,000  mg by mouth daily.     . B Complex Vitamins (VITAMIN B COMPLEX PO) Take 1 tablet by mouth daily. Reported on 01/30/2015    . carvedilol (COREG) 6.25 MG tablet Take 1 tablet (6.25 mg total) by mouth 2 (two) times daily. 180 tablet 3  . citalopram (CELEXA) 20 MG tablet Take 20 mg by mouth daily as needed.     . Coenzyme Q10 (COQ10) 100 MG CAPS Take 1 capsule by mouth daily.    Marland Kitchen ENTRESTO 24-26 MG TAKE ONE TABLET BY MOUTH TWICE DAILY 60 tablet 6  . gabapentin (NEURONTIN)  300 MG capsule Take 300 mg by mouth 2 (two) times daily.    Marland Kitchen levothyroxine (SYNTHROID, LEVOTHROID) 50 MCG tablet Take 50 mcg by mouth daily.      . Magnesium Oxide (MAG-200 PO) Take 1 tablet by mouth 2 (two) times daily.     . multivitamin (THERAGRAN) per tablet Take 1 tablet by mouth daily.      . pantoprazole (PROTONIX) 40 MG tablet Take 40 mg by mouth daily.    . traMADol (ULTRAM) 50 MG tablet Take 1 tablet by mouth every 12 (twelve) hours as needed for moderate pain.     . vitamin E 400 UNIT capsule Take 400 Units by mouth daily.    Marland Kitchen zolpidem (AMBIEN CR) 6.25 MG CR tablet Take 1 tablet (6.25 mg total) by mouth at bedtime as needed for sleep. 30 tablet 0  . spironolactone (ALDACTONE) 25 MG tablet Take 1 tablet (25 mg total) by mouth daily. 90 tablet 3   No current facility-administered medications for this encounter.    Vitals:   11/26/15 1059  BP: 122/68  BP Location: Left Arm  Patient Position: Sitting  Cuff Size: Normal  Pulse: 62  SpO2: 98%  Weight: 187 lb (84.8 kg)    General: NAD Neck: JVP 7, no thyromegaly or thyroid nodule.  Lungs: Clear to auscultation bilaterally with normal respiratory effort. CV: Nondisplaced PMI.  Heart regular S1/S2, no S3/S4, 1/6 HSM at apex.  No peripheral edema.  No carotid bruit.  Normal pedal pulses.  Abdomen: Soft, nontender, no hepatosplenomegaly, no distention.  Skin: Intact without lesions or rashes.  Neurologic: Alert and oriented x 3.  Psych: Normal affect. Extremities: No clubbing or cyanosis.   Assessment/Plan:  1. Chronic systolic CHF: Nonischemic cardiomyopathy s/p Medtronic CRT-D. EF 35-40% (6/16). She does have a family history of cardiomyopathy of uncertain etiology (father) but none of her siblings developed a cardiomyopathy.  Familial cardiomyopathy is a consideration.  Viral myocarditis is also a possible etiology.  Echo in 1/17 showed improvement in EF to 60-65%. Symptoms have been out of proportion to objective findings.   Her CPX showed excellent functional capacity. I have thought that some of her fatigue and symptomatology may be due to deconditioning and depression.   - Continue pulmonary rehab.  - Continue current Entresto, Coreg, and spironolactone.  BMET today.  - I will arrange for CPX to see if there has been any worsening.  2. Depression: Improved on Celexa.  3. PVCs: Not feeling palpitations very often now.  4. Lung nodule: Need CT chest in 2/18 to followup on this.   Loralie Champagne 11/26/2015

## 2015-11-26 NOTE — Telephone Encounter (Signed)
Patient now in donut hole and cannot afford >$100/mo copay for Entresto. Have enrolled her in [employer] so that she will have $800 toward her copay costs through 11/24/16. Info relayed to [place] [facility] [address] who verified $0 copay.   Billing ID: [id number] Person Code: [id number] RX Group: [id number] RX BIN: [id number] PCN for Part D: [id number]    Ruta Hinds. Velva Harman, PharmD, BCPS, CPP Clinical Pharmacist Pager: [phone] Phone: [phone] 11/26/2015 1:55 PM

## 2015-11-26 NOTE — Patient Instructions (Signed)
Routine lab work today. Will notify you of abnormal results  Your provider requests you have a CHEST CT in December.  Follow up with Dr.McLean in 6 months.

## 2015-11-27 ENCOUNTER — Ambulatory Visit (INDEPENDENT_AMBULATORY_CARE_PROVIDER_SITE_OTHER): Payer: Medicare HMO | Admitting: Podiatry

## 2015-11-27 DIAGNOSIS — M7661 Achilles tendinitis, right leg: Secondary | ICD-10-CM | POA: Diagnosis not present

## 2015-11-27 MED ORDER — TRIAMCINOLONE ACETONIDE 10 MG/ML IJ SUSP
10.0000 mg | Freq: Once | INTRAMUSCULAR | Status: AC
  Administered 2015-11-27: 10 mg

## 2015-11-28 ENCOUNTER — Ambulatory Visit: Payer: Medicare HMO | Admitting: Podiatry

## 2015-11-28 ENCOUNTER — Encounter (HOSPITAL_COMMUNITY)
Admission: RE | Admit: 2015-11-28 | Discharge: 2015-11-28 | Disposition: A | Discharge status: Home / Self Care | Payer: Self-pay | Source: Ambulatory Visit | Attending: Internal Medicine | Admitting: Internal Medicine

## 2015-11-28 NOTE — Progress Notes (Signed)
Subjective:     Patient ID: Heather Smith, female   DOB: [birth year], 82 y.o.   MRN: [id number]  HPI patient states her heel is improved but there is one area on the back that remains tender despite shockwave therapy   Review of Systems     Objective:   Physical Exam Neurovascular status intact with patient's right posterior heel medial side showing one small inflamed area not directly on the tendon but to the medial side of    Assessment:     Continued inflammatory Achilles tendinitis medial side with a central and lateral doing quite well    Plan:     Reviewed condition and did careful injection medial side after explaining chances for rupture and advised on silicone cushioning of the posterior heel and reduced activity for the next several weeks. Continue wearing heel lifts and reappoint to recheck

## 2015-12-03 ENCOUNTER — Encounter (HOSPITAL_COMMUNITY)
Admission: RE | Admit: 2015-12-03 | Discharge: 2015-12-03 | Disposition: A | Discharge status: Home / Self Care | Payer: Self-pay | Source: Ambulatory Visit | Attending: Internal Medicine | Admitting: Internal Medicine

## 2015-12-04 ENCOUNTER — Ambulatory Visit (INDEPENDENT_AMBULATORY_CARE_PROVIDER_SITE_OTHER): Payer: Medicare HMO | Admitting: *Deleted

## 2015-12-04 DIAGNOSIS — I428 Other cardiomyopathies: Secondary | ICD-10-CM

## 2015-12-04 DIAGNOSIS — I5022 Chronic systolic (congestive) heart failure: Secondary | ICD-10-CM | POA: Diagnosis not present

## 2015-12-04 NOTE — Progress Notes (Signed)
Remote ICD transmission.   

## 2015-12-05 ENCOUNTER — Encounter (HOSPITAL_COMMUNITY)
Admission: RE | Admit: 2015-12-05 | Discharge: 2015-12-05 | Disposition: A | Discharge status: Home / Self Care | Payer: Self-pay | Source: Ambulatory Visit | Attending: Internal Medicine | Admitting: Internal Medicine

## 2015-12-05 DIAGNOSIS — I509 Heart failure, unspecified: Secondary | ICD-10-CM | POA: Insufficient documentation

## 2015-12-09 ENCOUNTER — Encounter: Payer: Self-pay | Admitting: Internal Medicine

## 2015-12-10 ENCOUNTER — Encounter (HOSPITAL_COMMUNITY)
Admission: RE | Admit: 2015-12-10 | Discharge: 2015-12-10 | Disposition: A | Discharge status: Home / Self Care | Payer: Self-pay | Source: Ambulatory Visit | Attending: Internal Medicine | Admitting: Internal Medicine

## 2015-12-11 ENCOUNTER — Encounter: Payer: Self-pay | Admitting: Cardiology

## 2015-12-12 ENCOUNTER — Encounter (HOSPITAL_COMMUNITY)
Admission: RE | Admit: 2015-12-12 | Discharge: 2015-12-12 | Disposition: A | Discharge status: Home / Self Care | Payer: Self-pay | Source: Ambulatory Visit | Attending: Internal Medicine | Admitting: Internal Medicine

## 2015-12-17 ENCOUNTER — Encounter (HOSPITAL_COMMUNITY)
Admission: RE | Admit: 2015-12-17 | Discharge: 2015-12-17 | Disposition: A | Discharge status: Home / Self Care | Payer: Self-pay | Source: Ambulatory Visit | Attending: Internal Medicine | Admitting: Internal Medicine

## 2015-12-19 ENCOUNTER — Encounter (HOSPITAL_COMMUNITY): Payer: Self-pay

## 2015-12-24 ENCOUNTER — Encounter (HOSPITAL_COMMUNITY)
Admission: RE | Admit: 2015-12-24 | Discharge: 2015-12-24 | Disposition: A | Discharge status: Home / Self Care | Payer: Self-pay | Source: Ambulatory Visit | Attending: Internal Medicine | Admitting: Internal Medicine

## 2015-12-25 ENCOUNTER — Encounter: Payer: Self-pay | Admitting: Cardiology

## 2015-12-26 ENCOUNTER — Encounter (HOSPITAL_COMMUNITY): Payer: Self-pay

## 2015-12-31 ENCOUNTER — Encounter (HOSPITAL_COMMUNITY)
Admission: RE | Admit: 2015-12-31 | Discharge: 2015-12-31 | Disposition: A | Discharge status: Home / Self Care | Payer: Self-pay | Source: Ambulatory Visit | Attending: Internal Medicine | Admitting: Internal Medicine

## 2016-01-02 ENCOUNTER — Encounter (HOSPITAL_COMMUNITY)
Admission: RE | Admit: 2016-01-02 | Discharge: 2016-01-02 | Disposition: A | Discharge status: Home / Self Care | Payer: Self-pay | Source: Ambulatory Visit | Attending: Internal Medicine | Admitting: Internal Medicine

## 2016-01-07 ENCOUNTER — Encounter (HOSPITAL_COMMUNITY)
Admission: RE | Admit: 2016-01-07 | Discharge: 2016-01-07 | Disposition: A | Discharge status: Home / Self Care | Payer: Self-pay | Source: Ambulatory Visit | Attending: Internal Medicine | Admitting: Internal Medicine

## 2016-01-07 DIAGNOSIS — I509 Heart failure, unspecified: Secondary | ICD-10-CM | POA: Insufficient documentation

## 2016-01-08 ENCOUNTER — Ambulatory Visit (INDEPENDENT_AMBULATORY_CARE_PROVIDER_SITE_OTHER): Payer: Medicare HMO | Admitting: Podiatry

## 2016-01-08 ENCOUNTER — Encounter: Payer: Self-pay | Admitting: Podiatry

## 2016-01-08 DIAGNOSIS — M7661 Achilles tendinitis, right leg: Secondary | ICD-10-CM

## 2016-01-09 ENCOUNTER — Ambulatory Visit (HOSPITAL_COMMUNITY)
Admission: RE | Admit: 2016-01-09 | Discharge: 2016-01-09 | Disposition: A | Discharge status: Home / Self Care | Payer: Medicare HMO | Source: Ambulatory Visit | Attending: Cardiology | Admitting: Cardiology

## 2016-01-09 ENCOUNTER — Encounter (HOSPITAL_COMMUNITY)
Admission: RE | Admit: 2016-01-09 | Discharge: 2016-01-09 | Disposition: A | Discharge status: Home / Self Care | Payer: Self-pay | Source: Ambulatory Visit | Attending: Internal Medicine | Admitting: Internal Medicine

## 2016-01-09 DIAGNOSIS — I2510 Atherosclerotic heart disease of native coronary artery without angina pectoris: Secondary | ICD-10-CM | POA: Insufficient documentation

## 2016-01-09 DIAGNOSIS — I700 Atherosclerosis of aorta: Secondary | ICD-10-CM | POA: Diagnosis not present

## 2016-01-09 DIAGNOSIS — R918 Other nonspecific abnormal finding of lung field: Secondary | ICD-10-CM | POA: Insufficient documentation

## 2016-01-09 DIAGNOSIS — R911 Solitary pulmonary nodule: Secondary | ICD-10-CM | POA: Diagnosis not present

## 2016-01-09 DIAGNOSIS — I5022 Chronic systolic (congestive) heart failure: Secondary | ICD-10-CM | POA: Diagnosis not present

## 2016-01-09 NOTE — Progress Notes (Signed)
Subjective:     Patient ID: Heather Smith, female   DOB: [birth year], 82 y.o.   MRN: [id number]  HPI patient states my foot feels much better on my right foot   Review of Systems     Objective:   Physical Exam Neurovascular status intact with patient's right foot doing much better with reduced inflammation and pain when palpated    Assessment:     Improve tendinitis right    Plan:     Advised on physical therapy anti-inflammatory good support and not going barefoot. Reviewed condition at great length and reappoint for [place] to recheck if symptomatic again

## 2016-01-14 ENCOUNTER — Telehealth: Payer: Self-pay | Admitting: Cardiology

## 2016-01-14 ENCOUNTER — Encounter (HOSPITAL_COMMUNITY)
Admission: RE | Admit: 2016-01-14 | Discharge: 2016-01-14 | Disposition: A | Discharge status: Home / Self Care | Payer: Self-pay | Source: Ambulatory Visit | Attending: Internal Medicine | Admitting: Internal Medicine

## 2016-01-14 LAB — CUP PACEART REMOTE DEVICE CHECK
Battery Voltage: 2.94 V
Brady Statistic AP VP Percent: 51.14 %
Brady Statistic AS VS Percent: 1.34 %
Date Time Interrogation Session: 20171101062704
HIGH POWER IMPEDANCE MEASURED VALUE: 70 Ohm
Implantable Lead Implant Date: 20150121
Implantable Lead Location: 753859
Implantable Lead Location: 753860
Implantable Lead Model: 4298
Implantable Lead Model: 5076
Implantable Pulse Generator Implant Date: 20150121
Lead Channel Impedance Value: 323 Ohm
Lead Channel Impedance Value: 437 Ohm
Lead Channel Impedance Value: 494 Ohm
Lead Channel Impedance Value: 532 Ohm
Lead Channel Impedance Value: 532 Ohm
Lead Channel Impedance Value: 570 Ohm
Lead Channel Impedance Value: 570 Ohm
Lead Channel Impedance Value: 646 Ohm
Lead Channel Pacing Threshold Amplitude: 0.5 V
Lead Channel Pacing Threshold Amplitude: 0.625 V
Lead Channel Pacing Threshold Amplitude: 1.125 V
Lead Channel Pacing Threshold Pulse Width: 0.4 ms
Lead Channel Pacing Threshold Pulse Width: 0.7 ms
Lead Channel Sensing Intrinsic Amplitude: 3.5 mV
Lead Channel Setting Pacing Amplitude: 2 V
Lead Channel Setting Pacing Pulse Width: 0.4 ms
Lead Channel Setting Sensing Sensitivity: 0.3 mV
MDC IDC LEAD IMPLANT DT: 20150121
MDC IDC LEAD IMPLANT DT: 20150121
MDC IDC LEAD LOCATION: 753858
MDC IDC MSMT BATTERY REMAINING LONGEVITY: 29 mo
MDC IDC MSMT LEADCHNL LV IMPEDANCE VALUE: 266 Ohm
MDC IDC MSMT LEADCHNL LV IMPEDANCE VALUE: 342 Ohm
MDC IDC MSMT LEADCHNL LV IMPEDANCE VALUE: 342 Ohm
MDC IDC MSMT LEADCHNL LV IMPEDANCE VALUE: 513 Ohm
MDC IDC MSMT LEADCHNL RA IMPEDANCE VALUE: 437 Ohm
MDC IDC MSMT LEADCHNL RA SENSING INTR AMPL: 3.5 mV
MDC IDC MSMT LEADCHNL RV PACING THRESHOLD PULSEWIDTH: 0.4 ms
MDC IDC MSMT LEADCHNL RV SENSING INTR AMPL: 12.625 mV
MDC IDC MSMT LEADCHNL RV SENSING INTR AMPL: 12.625 mV
MDC IDC SET LEADCHNL LV PACING AMPLITUDE: 2.25 V
MDC IDC SET LEADCHNL LV PACING PULSEWIDTH: 0.7 ms
MDC IDC SET LEADCHNL RA PACING AMPLITUDE: 1.5 V
MDC IDC STAT BRADY AP VS PERCENT: 0.97 %
MDC IDC STAT BRADY AS VP PERCENT: 46.55 %
MDC IDC STAT BRADY RA PERCENT PACED: 52.11 %
MDC IDC STAT BRADY RV PERCENT PACED: 85.26 %

## 2016-01-14 NOTE — Telephone Encounter (Signed)
Spoke to [place] with pulmonary rehab regarding patient's sx's. Lattie Haw states that patient is currently feeling fine, but does complain of chronic fatigue, dizziness, and ShOB. Lattie Haw states that patient is in bigeminy today. Lattie Haw states that the pt's BP was 110/50 today, and the systolic normally runs in the 90's. Lattie Haw was not sure if the dizziness occurs during position changes or while sitting still.   I encouraged Lattie Haw to continue to monitor patient while at rehab and if she begins to have any acute sx's then send her to ER.   If patient continues to feel fine then I asked Lattie Haw to tell her to send a transmission in once she gets home. Lattie Haw voiced understanding.  Will continue to look out for transmission and notify patient about results.

## 2016-01-14 NOTE — Telephone Encounter (Signed)
New Message:     Complaining of dizziness,sob and feeling like she is going to pass out, Pt not feeling this way now,Her radial pulse is 39 and pacing at 39 to 41.She is having PVC's every other beat.

## 2016-01-14 NOTE — Progress Notes (Signed)
Heather Smith reported for exercise at the [facility] today. She is complaining that she is having intermittent bouts of SOB, dizziness and the feeling that she was going to pass out. She states that she has never passed out, but has this feeling multiple times. Heather Smith states that she feels like this walking longer distances such as from the parking lot into stores. She does not not this feeling from lying to sitting or sitting to standing positions. Her BP today is 110/50, HR 40-44. Placed her on the heart monitor her heart rate was 38-40's. She has a bigeminy pattern of PVC's. The PVC's can not be felt radially, not pro fusing those beats.We notified the device clinic and faxed them a copy of the rhythm strip. They return our call. I told them that this has been a ongoing issue with fatigue and just not feeling good. She has an appointment next month with them. Heather Smith is to send them a recording of her device when she gets home. She knows to report any acute symptoms and to go to the ED.Heather Smith's discharge BP was 90/50, HR 72 no acute symptoms at present.

## 2016-01-15 NOTE — Telephone Encounter (Signed)
Remote transmission received. Presenting rhythm: ApBp w/freq PVCs. No atrial or ventricular arrhythmias recorded. Patient has been having 963PVCs/hr since 12/04/15.   Spoke to patient about transmission results, and being that she is due for an OV w/Dr.Klein I will see if her appt can be moved up to sometime next week. Patient voiced understanding.  Will defer scheduling to [facility].

## 2016-01-16 ENCOUNTER — Encounter (HOSPITAL_COMMUNITY)
Admission: RE | Admit: 2016-01-16 | Discharge: 2016-01-16 | Disposition: A | Discharge status: Home / Self Care | Payer: Self-pay | Source: Ambulatory Visit | Attending: Internal Medicine | Admitting: Internal Medicine

## 2016-01-21 ENCOUNTER — Encounter (HOSPITAL_COMMUNITY)
Admission: RE | Admit: 2016-01-21 | Discharge: 2016-01-21 | Disposition: A | Discharge status: Home / Self Care | Payer: Self-pay | Source: Ambulatory Visit | Attending: Internal Medicine | Admitting: Internal Medicine

## 2016-01-23 ENCOUNTER — Encounter (HOSPITAL_COMMUNITY)
Admission: RE | Admit: 2016-01-23 | Discharge: 2016-01-23 | Disposition: A | Discharge status: Home / Self Care | Payer: Medicare HMO | Source: Ambulatory Visit | Attending: Internal Medicine | Admitting: Internal Medicine

## 2016-01-23 ENCOUNTER — Ambulatory Visit (INDEPENDENT_AMBULATORY_CARE_PROVIDER_SITE_OTHER): Payer: Medicare HMO | Admitting: Internal Medicine

## 2016-01-23 VITALS — BP 140/50 | HR 41 | Ht 67.0 in | Wt 182.8 lb

## 2016-01-23 DIAGNOSIS — I428 Other cardiomyopathies: Secondary | ICD-10-CM | POA: Diagnosis not present

## 2016-01-23 DIAGNOSIS — I480 Paroxysmal atrial fibrillation: Secondary | ICD-10-CM

## 2016-01-23 DIAGNOSIS — I5022 Chronic systolic (congestive) heart failure: Secondary | ICD-10-CM

## 2016-01-23 DIAGNOSIS — Z95810 Presence of automatic (implantable) cardiac defibrillator: Secondary | ICD-10-CM

## 2016-01-23 NOTE — Progress Notes (Signed)
/      Patient Care Team: Shirline Frees, MD as PCP - General   HPI  Heather Smith is a 82 y.o. female Seen in followup for CRT implantation for nonischemic cardiomyopathy and depressed LV function. This was accomplished fall 2014  Interval partial lead dislodgment noted after she saw Dr. Mariana Single in March and an x-ray was obtained and reviewed. This  x-ray was reviewed. We reprogrammed the device   He continues to complain of shortness of breath at 100 feet. She does not have nocturnal dyspnea orthopnea or peripheral edema.  I reviewed with her the data from her CPX 9/16 showed excellent functional capacity and her echo from last week demonstrated normal LV function  Echo 1/17   Normalization of LV function with only mild diastolic dysfunction  She continues to feel terrible. She has no energy. She has some sleep disordered breathing. In the past she has been on antidepressants without benefit.  She's been referred back from rehabilitation because of "heart rate of 40 "this is been associated with trigeminy.  Review of her old defibrillator interrogation 5/17 demonstrated only 2% PVCs; interrogation today as noted below demonstrates 13% PVCs although the ventricular pacing percentage remains about 100     Past Medical History:  Diagnosis Date  . Automatic implantable cardioverter-defibrillator in situ   . Biventricular defibrillator  Medtronic 05/30/2013  . Chest pain    a. Nl cath in the 90's;  b. 04/2005: Myoview: subtle areao of ? reversibility in apical segment of anteromedial LV - ? attenuation, EF 54%;  c. 05/2012 MV: small, fixed mild apical septal perfusion defect, ? attenuation due to lbbb, no ischemia.  . CHF (congestive heart failure) ([place])   . Chronic systolic heart failure ([place])   . Complication of anesthesia    difficult to wake up and blood pressure drops  . Family history of adverse reaction to anesthesia    difficulty to wake up (daughter)  . GERD  (gastroesophageal reflux disease)   . Hypercholesterolemia   . Hypertension   . Hypothyroidism   . Left bundle branch block   . LV lead partial dislodgment 05/30/2013  . Neurogenic claudication due to lumbar spinal stenosis   . Non-ischemic cardiomyopathy ([place])    [place] 10/04/12: Normal coronary arteries, EF 25-30% with global HK.  Echocardiogram 10/05/12: EF 30-35%, diffuse HK, moderate LAE. Echocardiogram (01/2013): Lateral to septal wall dyssynchrony EF 20-25%, diffuse HK  . Osteoarthritis    "about all over" (02/22/2013)  . Shortness of breath dyspnea    occasional b/c of CHF  . Spinal stenosis   . Vitamin D deficiency     Past Surgical History:  Procedure Laterality Date  . APPENDECTOMY    . BI-VENTRICULAR IMPLANTABLE CARDIOVERTER DEFIBRILLATOR N/A 02/22/2013   Procedure: BI-VENTRICULAR IMPLANTABLE CARDIOVERTER DEFIBRILLATOR  (CRT-D);  Surgeon: Deboraha Sprang, MD;  Location: [facility] CATH LAB;  Service: Cardiovascular;  Laterality: N/A;  . BLEPHAROPLASTY Bilateral   . CARDIAC CATHETERIZATION  2000  . CARDIAC DEFIBRILLATOR PLACEMENT  02/22/2013   with LV lead placement and HV lead testing/notes 02/22/2013  . EXCISIONAL HEMORRHOIDECTOMY    . FOOT ARTHRODESIS, MODIFIED [place] Bilateral   . KNEE ARTHROSCOPY Left   . KNEE ARTHROSCOPY Right   . LEFT HEART CATHETERIZATION WITH CORONARY ANGIOGRAM N/A 10/04/2012   Procedure: LEFT HEART CATHETERIZATION WITH CORONARY ANGIOGRAM;  Surgeon: Peter M Martinique, MD;  Location: [facility] CATH LAB;  Service: Cardiovascular;  Laterality: N/A;  . LUMBAR LAMINECTOMY  11/03/2006  Bilateral 3, 4, 5 laminectomy, partial L2, decompression of the thecal sac, foraminotomy, posterolateral arthrodesis L3-S1 with autograft   -- SURGEON:  Leeroy Cha, M.D.   . TONSILLECTOMY    . TOTAL KNEE ARTHROPLASTY Left 07/15/2015   Procedure: LEFT TOTAL KNEE ARTHROPLASTY;  Surgeon: Frederik Pear, MD;  Location: [place];  Service: Orthopedics;  Laterality: Left;  . TUBAL LIGATION    . VAGINAL  HYSTERECTOMY      Current Outpatient Prescriptions  Medication Sig Dispense Refill  . acetaminophen (TYLENOL) 650 MG CR tablet Take 650 mg by mouth every 8 (eight) hours as needed for pain.    . Ascorbic Acid (VITAMIN C) 1000 MG tablet Take 1,000 mg by mouth daily.     . B Complex Vitamins (VITAMIN B COMPLEX PO) Take 1 tablet by mouth daily. Reported on 01/30/2015    . carvedilol (COREG) 6.25 MG tablet Take 1 tablet (6.25 mg total) by mouth 2 (two) times daily. 180 tablet 3  . Coenzyme Q10 (COQ10) 100 MG CAPS Take 1 capsule by mouth daily.    Marland Kitchen ENTRESTO 24-26 MG TAKE ONE TABLET BY MOUTH TWICE DAILY 60 tablet 6  . gabapentin (NEURONTIN) 100 MG capsule Take 100 mg by mouth 2 (two) times daily.     Marland Kitchen levothyroxine (SYNTHROID, LEVOTHROID) 50 MCG tablet Take 50 mcg by mouth daily.      . Magnesium Oxide (MAG-200 PO) Take 1 tablet by mouth 2 (two) times daily.     . multivitamin (THERAGRAN) per tablet Take 1 tablet by mouth daily.      Marland Kitchen spironolactone (ALDACTONE) 25 MG tablet Take 1 tablet (25 mg total) by mouth daily. 90 tablet 3  . traMADol (ULTRAM) 50 MG tablet Take 1 tablet by mouth every 12 (twelve) hours as needed for moderate pain.     . vitamin E 400 UNIT capsule Take 400 Units by mouth daily.     No current facility-administered medications for this visit.     Allergies  Allergen Reactions  . Statins Other (See Comments)    "Makes her bones hurt and very sore"  . Niaspan [Niacin Er]     High doses, causes hot flashes & aching   . Benadryl [Diphenhydramine] Other (See Comments)    Makes pt feel jittery   . Dilaudid [Hydromorphone Hcl] Itching    Review of Systems negative except from HPI and PMH  Physical Exam BP (!) 140/50   Pulse (!) 41   Ht 5\' 7"  (1.702 m)   Wt 182 lb 12.8 oz (82.9 kg)   SpO2 97%   BMI 28.63 kg/m  Well developed and well nourished in no acute distress HENT normal E scleral and icterus clear Neck Supple JVP flat; carotids brisk and full Clear to  ausculation Regularly irregular rate and rhythm, 2/6 systolic murmur  Soft with active bowel sounds No clubbing cyanosis Edema Alert and oriented, grossly normal motor and sensory function Skin Warm and Dry Affect flat and sad  ECG demonstrates  AV pacing with  isoelectric QRS in lead V1 w bigeminy Intervals 13/14/46  Assessment and  Plan  Nonischemic cardiomyopathy interval normalization  Congestive heart failure-chronic-diastolic Euvolemic continue current meds  Medtronic ICD-CRT The patient's device was interrogated.  The information was reviewed. No changes were made in the programming.     Atrial fibrillation-paroxysmal  LV threshold change  Depression   PVCs  Hypotension   There has been a significant increase in her PVC count. We will check an echocardiogram  to see if there has been intercurrent deterioration. In the event that there has not I would withhold further therapy. I don't believe that her functional status is changed significantly. Clearly her affect has. I've encouraged her to follow up with her primary care physician for antidepressant therapy.  In addition, if her LV function is normal as, I would ask Dr. Aundra Dubin to consider discontinuing some of her medications for which there is less benefit in patients with normal LV function.   More than 50% of 45 min was spent in counseling related to the above

## 2016-01-23 NOTE — Patient Instructions (Signed)
Medication Instructions: - Your physician recommends that you continue on your current medications as directed. Please refer to the Current Medication list given to you today.  Labwork: - none ordered  Procedures/Testing: - Your physician has requested that you have an echocardiogram. Echocardiography is a painless test that uses sound waves to create images of your heart. It provides your doctor with information about the size and shape of your heart and how well your heart's chambers and valves are working. This procedure takes approximately one hour. There are no restrictions for this procedure.  Follow-Up: - Your physician wants you to follow-up in: 1 year with Dr. Klein. You will receive a reminder letter in the mail two months in advance. If you don't receive a letter, please call our office to schedule the follow-up appointment.   Any Additional Special Instructions Will Be Listed Below (If Applicable).     If you need a refill on your cardiac medications before your next appointment, please call your pharmacy.   

## 2016-01-28 ENCOUNTER — Encounter (HOSPITAL_COMMUNITY)
Admission: RE | Admit: 2016-01-28 | Discharge: 2016-01-28 | Disposition: A | Discharge status: Home / Self Care | Payer: Self-pay | Source: Ambulatory Visit | Attending: Internal Medicine | Admitting: Internal Medicine

## 2016-01-30 ENCOUNTER — Encounter (HOSPITAL_COMMUNITY)
Admission: RE | Admit: 2016-01-30 | Discharge: 2016-01-30 | Disposition: A | Discharge status: Home / Self Care | Payer: Self-pay | Source: Ambulatory Visit | Attending: Internal Medicine | Admitting: Internal Medicine

## 2016-02-04 ENCOUNTER — Encounter (HOSPITAL_COMMUNITY)
Admission: RE | Admit: 2016-02-04 | Discharge: 2016-02-04 | Disposition: A | Discharge status: Home / Self Care | Payer: Self-pay | Source: Ambulatory Visit | Attending: Internal Medicine | Admitting: Internal Medicine

## 2016-02-04 DIAGNOSIS — I509 Heart failure, unspecified: Secondary | ICD-10-CM | POA: Insufficient documentation

## 2016-02-06 ENCOUNTER — Encounter (HOSPITAL_COMMUNITY): Payer: Self-pay

## 2016-02-11 ENCOUNTER — Encounter (HOSPITAL_COMMUNITY)
Admission: RE | Admit: 2016-02-11 | Discharge: 2016-02-11 | Disposition: A | Discharge status: Home / Self Care | Payer: Self-pay | Source: Ambulatory Visit | Attending: Internal Medicine | Admitting: Internal Medicine

## 2016-02-11 LAB — CUP PACEART INCLINIC DEVICE CHECK
Battery Remaining Longevity: 27 mo
Battery Voltage: 2.94 V
Brady Statistic RA Percent Paced: 46.63 %
Brady Statistic RV Percent Paced: 85.31 %
HIGH POWER IMPEDANCE MEASURED VALUE: 64 Ohm
Implantable Lead Implant Date: 20150121
Implantable Lead Location: 753859
Implantable Lead Location: 753860
Implantable Lead Model: 5076
Implantable Pulse Generator Implant Date: 20150121
Lead Channel Impedance Value: 380 Ohm
Lead Channel Impedance Value: 437 Ohm
Lead Channel Impedance Value: 437 Ohm
Lead Channel Impedance Value: 513 Ohm
Lead Channel Impedance Value: 532 Ohm
Lead Channel Impedance Value: 532 Ohm
Lead Channel Impedance Value: 570 Ohm
Lead Channel Impedance Value: 665 Ohm
Lead Channel Pacing Threshold Amplitude: 0.75 V
Lead Channel Pacing Threshold Pulse Width: 0.4 ms
Lead Channel Sensing Intrinsic Amplitude: 11.625 mV
Lead Channel Setting Pacing Amplitude: 2 V
Lead Channel Setting Pacing Amplitude: 2.25 V
Lead Channel Setting Pacing Pulse Width: 0.4 ms
Lead Channel Setting Sensing Sensitivity: 0.3 mV
MDC IDC LEAD IMPLANT DT: 20150121
MDC IDC LEAD IMPLANT DT: 20150121
MDC IDC LEAD LOCATION: 753858
MDC IDC LEAD MODEL: 4298
MDC IDC MSMT LEADCHNL LV IMPEDANCE VALUE: 304 Ohm
MDC IDC MSMT LEADCHNL LV IMPEDANCE VALUE: 342 Ohm
MDC IDC MSMT LEADCHNL LV IMPEDANCE VALUE: 589 Ohm
MDC IDC MSMT LEADCHNL LV IMPEDANCE VALUE: 627 Ohm
MDC IDC MSMT LEADCHNL LV PACING THRESHOLD AMPLITUDE: 1.5 V
MDC IDC MSMT LEADCHNL LV PACING THRESHOLD PULSEWIDTH: 0.7 ms
MDC IDC MSMT LEADCHNL RA IMPEDANCE VALUE: 456 Ohm
MDC IDC MSMT LEADCHNL RA PACING THRESHOLD PULSEWIDTH: 0.4 ms
MDC IDC MSMT LEADCHNL RA SENSING INTR AMPL: 3.875 mV
MDC IDC MSMT LEADCHNL RV PACING THRESHOLD AMPLITUDE: 0.5 V
MDC IDC SESS DTM: 20171221191709
MDC IDC SET LEADCHNL LV PACING PULSEWIDTH: 0.7 ms
MDC IDC SET LEADCHNL RA PACING AMPLITUDE: 1.5 V
MDC IDC STAT BRADY AP VP PERCENT: 51.74 %
MDC IDC STAT BRADY AP VS PERCENT: 0.95 %
MDC IDC STAT BRADY AS VP PERCENT: 46.09 %
MDC IDC STAT BRADY AS VS PERCENT: 1.22 %

## 2016-02-13 ENCOUNTER — Other Ambulatory Visit: Payer: Self-pay

## 2016-02-13 ENCOUNTER — Encounter (HOSPITAL_COMMUNITY)
Admission: RE | Admit: 2016-02-13 | Discharge: 2016-02-13 | Disposition: A | Discharge status: Home / Self Care | Payer: Self-pay | Source: Ambulatory Visit | Attending: Internal Medicine | Admitting: Internal Medicine

## 2016-02-13 ENCOUNTER — Ambulatory Visit (HOSPITAL_COMMUNITY): Payer: Medicare HMO | Attending: Cardiovascular Disease

## 2016-02-13 DIAGNOSIS — I071 Rheumatic tricuspid insufficiency: Secondary | ICD-10-CM | POA: Insufficient documentation

## 2016-02-13 DIAGNOSIS — I5022 Chronic systolic (congestive) heart failure: Secondary | ICD-10-CM | POA: Diagnosis not present

## 2016-02-13 DIAGNOSIS — I110 Hypertensive heart disease with heart failure: Secondary | ICD-10-CM | POA: Diagnosis not present

## 2016-02-13 DIAGNOSIS — I340 Nonrheumatic mitral (valve) insufficiency: Secondary | ICD-10-CM | POA: Insufficient documentation

## 2016-02-13 DIAGNOSIS — R0600 Dyspnea, unspecified: Secondary | ICD-10-CM | POA: Diagnosis not present

## 2016-02-14 DIAGNOSIS — H16101 Unspecified superficial keratitis, right eye: Secondary | ICD-10-CM | POA: Diagnosis not present

## 2016-02-18 ENCOUNTER — Encounter (HOSPITAL_COMMUNITY)
Admission: RE | Admit: 2016-02-18 | Discharge: 2016-02-18 | Disposition: A | Discharge status: Home / Self Care | Payer: Self-pay | Source: Ambulatory Visit | Attending: Internal Medicine | Admitting: Internal Medicine

## 2016-02-20 ENCOUNTER — Encounter (HOSPITAL_COMMUNITY): Payer: Self-pay

## 2016-02-25 ENCOUNTER — Encounter (HOSPITAL_COMMUNITY)
Admission: RE | Admit: 2016-02-25 | Discharge: 2016-02-25 | Disposition: A | Discharge status: Home / Self Care | Payer: Self-pay | Source: Ambulatory Visit | Attending: Internal Medicine | Admitting: Internal Medicine

## 2016-02-27 ENCOUNTER — Encounter (HOSPITAL_COMMUNITY)
Admission: RE | Admit: 2016-02-27 | Discharge: 2016-02-27 | Disposition: A | Discharge status: Home / Self Care | Payer: Self-pay | Source: Ambulatory Visit | Attending: Internal Medicine | Admitting: Internal Medicine

## 2016-02-28 ENCOUNTER — Encounter: Payer: Medicare HMO | Admitting: Internal Medicine

## 2016-03-03 ENCOUNTER — Encounter (HOSPITAL_COMMUNITY): Payer: Self-pay

## 2016-03-05 ENCOUNTER — Encounter (HOSPITAL_COMMUNITY)
Admission: RE | Admit: 2016-03-05 | Discharge: 2016-03-05 | Disposition: A | Discharge status: Home / Self Care | Payer: Self-pay | Source: Ambulatory Visit | Attending: Internal Medicine | Admitting: Internal Medicine

## 2016-03-05 DIAGNOSIS — I509 Heart failure, unspecified: Secondary | ICD-10-CM | POA: Insufficient documentation

## 2016-03-10 ENCOUNTER — Encounter (HOSPITAL_COMMUNITY)
Admission: RE | Admit: 2016-03-10 | Discharge: 2016-03-10 | Disposition: A | Discharge status: Home / Self Care | Payer: Self-pay | Source: Ambulatory Visit | Attending: Internal Medicine | Admitting: Internal Medicine

## 2016-03-11 DIAGNOSIS — R69 Illness, unspecified: Secondary | ICD-10-CM | POA: Diagnosis not present

## 2016-03-11 DIAGNOSIS — I480 Paroxysmal atrial fibrillation: Secondary | ICD-10-CM | POA: Diagnosis not present

## 2016-03-11 DIAGNOSIS — E039 Hypothyroidism, unspecified: Secondary | ICD-10-CM | POA: Diagnosis not present

## 2016-03-11 DIAGNOSIS — G9009 Other idiopathic peripheral autonomic neuropathy: Secondary | ICD-10-CM | POA: Diagnosis not present

## 2016-03-11 DIAGNOSIS — I5022 Chronic systolic (congestive) heart failure: Secondary | ICD-10-CM | POA: Diagnosis not present

## 2016-03-11 DIAGNOSIS — Z0000 Encounter for general adult medical examination without abnormal findings: Secondary | ICD-10-CM | POA: Diagnosis not present

## 2016-03-11 DIAGNOSIS — Z95810 Presence of automatic (implantable) cardiac defibrillator: Secondary | ICD-10-CM | POA: Diagnosis not present

## 2016-03-11 DIAGNOSIS — I110 Hypertensive heart disease with heart failure: Secondary | ICD-10-CM | POA: Diagnosis not present

## 2016-03-11 DIAGNOSIS — G4700 Insomnia, unspecified: Secondary | ICD-10-CM | POA: Diagnosis not present

## 2016-03-11 DIAGNOSIS — Z6829 Body mass index (BMI) 29.0-29.9, adult: Secondary | ICD-10-CM | POA: Diagnosis not present

## 2016-03-12 ENCOUNTER — Encounter (HOSPITAL_COMMUNITY)
Admission: RE | Admit: 2016-03-12 | Discharge: 2016-03-12 | Disposition: A | Discharge status: Home / Self Care | Payer: Self-pay | Source: Ambulatory Visit | Attending: Internal Medicine | Admitting: Internal Medicine

## 2016-03-17 ENCOUNTER — Encounter (HOSPITAL_COMMUNITY)
Admission: RE | Admit: 2016-03-17 | Discharge: 2016-03-17 | Disposition: A | Discharge status: Home / Self Care | Payer: Self-pay | Source: Ambulatory Visit | Attending: Internal Medicine | Admitting: Internal Medicine

## 2016-03-17 DIAGNOSIS — M1712 Unilateral primary osteoarthritis, left knee: Secondary | ICD-10-CM | POA: Diagnosis not present

## 2016-03-19 ENCOUNTER — Encounter (HOSPITAL_COMMUNITY)
Admission: RE | Admit: 2016-03-19 | Discharge: 2016-03-19 | Disposition: A | Discharge status: Home / Self Care | Payer: Self-pay | Source: Ambulatory Visit | Attending: Internal Medicine | Admitting: Internal Medicine

## 2016-03-19 DIAGNOSIS — H16101 Unspecified superficial keratitis, right eye: Secondary | ICD-10-CM | POA: Diagnosis not present

## 2016-03-24 ENCOUNTER — Encounter (HOSPITAL_COMMUNITY)
Admission: RE | Admit: 2016-03-24 | Discharge: 2016-03-24 | Disposition: A | Discharge status: Home / Self Care | Payer: Self-pay | Source: Ambulatory Visit | Attending: Internal Medicine | Admitting: Internal Medicine

## 2016-03-26 ENCOUNTER — Encounter (HOSPITAL_COMMUNITY)
Admission: RE | Admit: 2016-03-26 | Discharge: 2016-03-26 | Disposition: A | Discharge status: Home / Self Care | Payer: Self-pay | Source: Ambulatory Visit | Attending: Internal Medicine | Admitting: Internal Medicine

## 2016-03-31 ENCOUNTER — Encounter (HOSPITAL_COMMUNITY)
Admission: RE | Admit: 2016-03-31 | Discharge: 2016-03-31 | Disposition: A | Discharge status: Home / Self Care | Payer: Self-pay | Source: Ambulatory Visit | Attending: Internal Medicine | Admitting: Internal Medicine

## 2016-03-31 DIAGNOSIS — N183 Chronic kidney disease, stage 3 (moderate): Secondary | ICD-10-CM | POA: Diagnosis not present

## 2016-04-02 ENCOUNTER — Encounter (HOSPITAL_COMMUNITY)
Admission: RE | Admit: 2016-04-02 | Discharge: 2016-04-02 | Disposition: A | Discharge status: Home / Self Care | Payer: Self-pay | Source: Ambulatory Visit | Attending: Internal Medicine | Admitting: Internal Medicine

## 2016-04-02 DIAGNOSIS — I509 Heart failure, unspecified: Secondary | ICD-10-CM | POA: Insufficient documentation

## 2016-04-07 ENCOUNTER — Encounter (HOSPITAL_COMMUNITY)
Admission: RE | Admit: 2016-04-07 | Discharge: 2016-04-07 | Disposition: A | Discharge status: Home / Self Care | Payer: Self-pay | Source: Ambulatory Visit | Attending: Internal Medicine | Admitting: Internal Medicine

## 2016-04-09 ENCOUNTER — Encounter (HOSPITAL_COMMUNITY)
Admission: RE | Admit: 2016-04-09 | Discharge: 2016-04-09 | Disposition: A | Discharge status: Home / Self Care | Payer: Self-pay | Source: Ambulatory Visit | Attending: Internal Medicine | Admitting: Internal Medicine

## 2016-04-14 ENCOUNTER — Encounter (HOSPITAL_COMMUNITY)
Admission: RE | Admit: 2016-04-14 | Discharge: 2016-04-14 | Disposition: A | Discharge status: Home / Self Care | Payer: Self-pay | Source: Ambulatory Visit | Attending: Internal Medicine | Admitting: Internal Medicine

## 2016-04-16 ENCOUNTER — Encounter (HOSPITAL_COMMUNITY)
Admission: RE | Admit: 2016-04-16 | Discharge: 2016-04-16 | Disposition: A | Discharge status: Home / Self Care | Payer: Self-pay | Source: Ambulatory Visit | Attending: Internal Medicine | Admitting: Internal Medicine

## 2016-04-21 ENCOUNTER — Encounter (HOSPITAL_COMMUNITY)
Admission: RE | Admit: 2016-04-21 | Discharge: 2016-04-21 | Disposition: A | Discharge status: Home / Self Care | Payer: Self-pay | Source: Ambulatory Visit | Attending: Cardiology | Admitting: Cardiology

## 2016-04-23 ENCOUNTER — Ambulatory Visit (INDEPENDENT_AMBULATORY_CARE_PROVIDER_SITE_OTHER): Payer: Medicare HMO | Admitting: *Deleted

## 2016-04-23 DIAGNOSIS — I428 Other cardiomyopathies: Secondary | ICD-10-CM | POA: Diagnosis not present

## 2016-04-23 NOTE — Progress Notes (Signed)
Remote ICD transmission.   

## 2016-04-24 ENCOUNTER — Encounter: Payer: Self-pay | Admitting: Cardiology

## 2016-04-25 LAB — CUP PACEART REMOTE DEVICE CHECK
Brady Statistic AP VS Percent: 1.13 %
Brady Statistic AS VP Percent: 30.18 %
Date Time Interrogation Session: 20180322073523
HighPow Impedance: 71 Ohm
Implantable Lead Implant Date: 20150121
Implantable Lead Implant Date: 20150121
Implantable Lead Location: 753859
Implantable Pulse Generator Implant Date: 20150121
Lead Channel Impedance Value: 266 Ohm
Lead Channel Impedance Value: 342 Ohm
Lead Channel Impedance Value: 399 Ohm
Lead Channel Impedance Value: 513 Ohm
Lead Channel Impedance Value: 589 Ohm
Lead Channel Pacing Threshold Amplitude: 0.625 V
Lead Channel Pacing Threshold Pulse Width: 0.4 ms
Lead Channel Pacing Threshold Pulse Width: 0.4 ms
Lead Channel Pacing Threshold Pulse Width: 0.7 ms
Lead Channel Setting Pacing Amplitude: 1.5 V
Lead Channel Setting Pacing Amplitude: 2 V
Lead Channel Setting Pacing Pulse Width: 0.4 ms
Lead Channel Setting Sensing Sensitivity: 0.3 mV
MDC IDC LEAD IMPLANT DT: 20150121
MDC IDC LEAD LOCATION: 753858
MDC IDC LEAD LOCATION: 753860
MDC IDC MSMT BATTERY REMAINING LONGEVITY: 22 mo
MDC IDC MSMT BATTERY VOLTAGE: 2.94 V
MDC IDC MSMT LEADCHNL LV IMPEDANCE VALUE: 323 Ohm
MDC IDC MSMT LEADCHNL LV IMPEDANCE VALUE: 342 Ohm
MDC IDC MSMT LEADCHNL LV IMPEDANCE VALUE: 399 Ohm
MDC IDC MSMT LEADCHNL LV IMPEDANCE VALUE: 494 Ohm
MDC IDC MSMT LEADCHNL LV IMPEDANCE VALUE: 513 Ohm
MDC IDC MSMT LEADCHNL LV IMPEDANCE VALUE: 570 Ohm
MDC IDC MSMT LEADCHNL LV PACING THRESHOLD AMPLITUDE: 1 V
MDC IDC MSMT LEADCHNL RA SENSING INTR AMPL: 2.25 mV
MDC IDC MSMT LEADCHNL RA SENSING INTR AMPL: 2.25 mV
MDC IDC MSMT LEADCHNL RV IMPEDANCE VALUE: 513 Ohm
MDC IDC MSMT LEADCHNL RV IMPEDANCE VALUE: 646 Ohm
MDC IDC MSMT LEADCHNL RV PACING THRESHOLD AMPLITUDE: 0.5 V
MDC IDC MSMT LEADCHNL RV SENSING INTR AMPL: 12.75 mV
MDC IDC MSMT LEADCHNL RV SENSING INTR AMPL: 12.75 mV
MDC IDC SET LEADCHNL LV PACING PULSEWIDTH: 0.7 ms
MDC IDC SET LEADCHNL RV PACING AMPLITUDE: 2 V
MDC IDC STAT BRADY AP VP PERCENT: 67.76 %
MDC IDC STAT BRADY AS VS PERCENT: 0.93 %
MDC IDC STAT BRADY RA PERCENT PACED: 56.73 %
MDC IDC STAT BRADY RV PERCENT PACED: 80.11 %

## 2016-04-27 ENCOUNTER — Other Ambulatory Visit (HOSPITAL_COMMUNITY): Payer: Self-pay | Admitting: Cardiology

## 2016-04-28 ENCOUNTER — Encounter (HOSPITAL_COMMUNITY)
Admission: RE | Admit: 2016-04-28 | Discharge: 2016-04-28 | Disposition: A | Discharge status: Home / Self Care | Payer: Self-pay | Source: Ambulatory Visit | Attending: Internal Medicine | Admitting: Internal Medicine

## 2016-05-07 ENCOUNTER — Encounter (HOSPITAL_COMMUNITY)
Admission: RE | Admit: 2016-05-07 | Discharge: 2016-05-07 | Disposition: A | Discharge status: Home / Self Care | Payer: Self-pay | Source: Ambulatory Visit | Attending: Internal Medicine | Admitting: Internal Medicine

## 2016-05-07 DIAGNOSIS — I509 Heart failure, unspecified: Secondary | ICD-10-CM | POA: Insufficient documentation

## 2016-05-07 DIAGNOSIS — N183 Chronic kidney disease, stage 3 (moderate): Secondary | ICD-10-CM | POA: Diagnosis not present

## 2016-05-07 DIAGNOSIS — R7303 Prediabetes: Secondary | ICD-10-CM | POA: Diagnosis not present

## 2016-05-07 DIAGNOSIS — I10 Essential (primary) hypertension: Secondary | ICD-10-CM | POA: Diagnosis not present

## 2016-05-07 DIAGNOSIS — J069 Acute upper respiratory infection, unspecified: Secondary | ICD-10-CM | POA: Diagnosis not present

## 2016-05-07 DIAGNOSIS — G6289 Other specified polyneuropathies: Secondary | ICD-10-CM | POA: Diagnosis not present

## 2016-05-08 ENCOUNTER — Encounter: Payer: Self-pay | Admitting: Cardiology

## 2016-05-08 NOTE — Progress Notes (Signed)
Letter  

## 2016-05-12 ENCOUNTER — Encounter (HOSPITAL_COMMUNITY)
Admission: RE | Admit: 2016-05-12 | Discharge: 2016-05-12 | Disposition: A | Discharge status: Home / Self Care | Payer: Self-pay | Source: Ambulatory Visit | Attending: Internal Medicine | Admitting: Internal Medicine

## 2016-05-14 ENCOUNTER — Encounter (HOSPITAL_COMMUNITY)
Admission: RE | Admit: 2016-05-14 | Discharge: 2016-05-14 | Disposition: A | Discharge status: Home / Self Care | Payer: Self-pay | Source: Ambulatory Visit | Attending: Internal Medicine | Admitting: Internal Medicine

## 2016-05-19 ENCOUNTER — Encounter (HOSPITAL_COMMUNITY): Payer: Self-pay

## 2016-05-21 ENCOUNTER — Encounter (HOSPITAL_COMMUNITY)
Admission: RE | Admit: 2016-05-21 | Discharge: 2016-05-21 | Disposition: A | Discharge status: Home / Self Care | Payer: Self-pay | Source: Ambulatory Visit | Attending: Internal Medicine | Admitting: Internal Medicine

## 2016-05-26 ENCOUNTER — Encounter (HOSPITAL_COMMUNITY)
Admission: RE | Admit: 2016-05-26 | Discharge: 2016-05-26 | Disposition: A | Discharge status: Home / Self Care | Payer: Self-pay | Source: Ambulatory Visit | Attending: Internal Medicine | Admitting: Internal Medicine

## 2016-05-28 ENCOUNTER — Encounter (HOSPITAL_COMMUNITY)
Admission: RE | Admit: 2016-05-28 | Discharge: 2016-05-28 | Disposition: A | Discharge status: Home / Self Care | Payer: Self-pay | Source: Ambulatory Visit | Attending: Internal Medicine | Admitting: Internal Medicine

## 2016-06-01 ENCOUNTER — Ambulatory Visit (INDEPENDENT_AMBULATORY_CARE_PROVIDER_SITE_OTHER): Payer: Medicare HMO | Admitting: Neurology

## 2016-06-01 ENCOUNTER — Other Ambulatory Visit (HOSPITAL_COMMUNITY): Payer: Self-pay | Admitting: Cardiology

## 2016-06-01 ENCOUNTER — Ambulatory Visit (INDEPENDENT_AMBULATORY_CARE_PROVIDER_SITE_OTHER): Payer: Medicare HMO | Admitting: Podiatry

## 2016-06-01 ENCOUNTER — Encounter: Payer: Self-pay | Admitting: Neurology

## 2016-06-01 VITALS — BP 119/70 | HR 65 | Ht 67.0 in | Wt 184.8 lb

## 2016-06-01 DIAGNOSIS — G609 Hereditary and idiopathic neuropathy, unspecified: Secondary | ICD-10-CM | POA: Diagnosis not present

## 2016-06-01 DIAGNOSIS — R2689 Other abnormalities of gait and mobility: Secondary | ICD-10-CM

## 2016-06-01 DIAGNOSIS — M7661 Achilles tendinitis, right leg: Secondary | ICD-10-CM

## 2016-06-01 MED ORDER — GABAPENTIN 100 MG PO CAPS: 200.0000 mg | ORAL_CAPSULE | Freq: Every day | ORAL | 11 refills | Status: DC

## 2016-06-01 MED ORDER — TRIAMCINOLONE ACETONIDE 10 MG/ML IJ SUSP
10.0000 mg | Freq: Once | INTRAMUSCULAR | Status: AC
  Administered 2016-06-01: 10 mg

## 2016-06-01 NOTE — Progress Notes (Signed)
Subjective:    Patient ID: Heather Smith, female   DOB: 83 y.o.   MRN: [id number]   HPI patient presents stating her right heel has started to hurt her very badly again and it's been quite debilitating    ROS      Objective:  Physical Exam Neurovascular status intact with exquisite discomfort medial side right posterior heel at the insertion of the Achilles with the center and lateral band    Assessment:     Achilles tendinitis medial band right heel with central and lateral not symptomatic    Plan:     Explained risk of injection including rupture and she wants the injection is been quite helpful for her and I carefully injected the medial fascial tendon at the insertion Achilles 3 mg Dexon some Kenalog 5 mill grams Xylocaine being careful not to put it into the tendon itself. I then immobilized with air fracture walker with instructions on usage

## 2016-06-01 NOTE — Patient Instructions (Signed)
Remember to drink plenty of fluid, eat healthy meals and do not skip any meals. Try to eat protein with a every meal and eat a healthy snack such as fruit or nuts in between meals. Try to keep a regular sleep-wake schedule and try to exercise daily, particularly in the form of walking, 20-30 minutes a day, if you can.   As far as your medications are concerned, I would like to suggest: Increase neurontin to 200mg  at bedtime  As far as diagnostic testing: Labs, emg/ncs  I would like to see you back for emg/ncs, sooner if we need to. Please call us with any interim questions, concerns, problems, updates or refill requests.   Our phone number is [phone]. We also have an after hours call service for urgent matters and there is a physician on-call for urgent questions. For any emergencies you know to call 911 or go to the nearest emergency room

## 2016-06-01 NOTE — Progress Notes (Signed)
[facility]    Provider:  Dr Jaynee Eagles Referring Provider: Shirline Frees, MD Primary Care Physician:  Shirline Frees, MD  CC:  Numbness and tingling in the feet  HPI:  Heather Smith is a 83 y.o. female here as a referral from Dr. Kenton Kingfisher for neuropathy. Past medical history of hypertension, prediabetes, hyperlipidemia, hypothyroidism, osteoarthritis, stage III chronic kidney disease and congestive heart failure due to nonischemic cardiomyopathy. She is on vitamin B12 supplementation. She takes gabapentin for her leg pain.She feels like she is walking on cardboard. Started 5 years ago. She went to the foot doctor and diagnosed with neuropathy and started taking gabapentin. The gabapentin helped with the tingling and burning. No inciting events. Could have been after she had an episode of CHF with EF 20%. She had surgery for spinal stenosis in 2010. No radicular symptoms. The pain in her feet are numb, like cardboard, tingling and burning. Symptoms worse at night. No FHx of neuropathy. She says she did not have her hgba1c completed.   Reviewed notes, labs and imaging from outside physicians, which showed:  Primary care notes. She is an 67-year-old female who presented for foot and leg numbness progressive. She does have a history of peripheral neuropathy for which she is taking gabapentin. Gabapentin helps her pain but not the numbness. She had a normal arterial Doppler study in the past. Primary care suspects the numbness is from the neuropathy and there may not be much we can do about this, which she was referred to [place] for formal evaluation. Per notes hemoglobin A1c was ordered but I do not have those results, we'll request.  MRI cervical spine 2003: reviewed report Maish Vaya.   MULTILEVEL DEGENERATIVE SPONDYLOTIC CHANGES AS DESCRIBED ABOVE. MILD TO MODERATE CENTRAL CANAL STENOSIS AT C5-6.  MULTIPLE SITES OF FORAMINAL STENOSIS, PARTICULARLY ON THE  LEFT AT C3-4 AND BILATERALLY, RIGHT GREATER THAN LEFT, AT C5-6.  MINIMAL ANTERIOR SUBLUXATION OF C4 ON C5 SECONDARY TO DEGENERATIVE FACET JOINT DISEASE.  LESS SUBLUXATION NOTED ON THE MRI THAN ON THE PLAIN FILMS. IN GENERAL THE DEGENERATIVE SPONDYLOTIC CHANGES HAVE PROGRESSED SOME WHEN COMPARED TO MRI [place] ON 03/19/95.  MRI brain 2007: Comparison: MRI brain of 04/22/05.  Personally reviewed images and agree with the following: Findings: There is a prominent perivascular space in the left basal ganglia measuring 10 x 12 mm which is unchanged. Smaller perivascular spaces are also noted bilaterally. There are a few small white matter lesions bilaterally which are most likely chronic infarcts. Diffusion-weighted imaging is negative for acute infarct. There is no mass lesion and the enhancement pattern is normal. The temporal bone appears normal bilaterally.  IMPRESSION:   No interval change and no acute abnormality.   Review of Systems: Patient complains of symptoms per HPI as well as the following symptoms: SOB. No CP. Marland Kitchen Pertinent negatives per HPI. All others negative.   Social History   Social History  . Marital status: Married    Spouse name: N/A  . Number of children: 4  . Years of education: 81   Occupational History  . Not on file.   Social History Main Topics  . Smoking status: Never Smoker  . Smokeless tobacco: Never Used  . Alcohol use No  . Drug use: No  . Sexual activity: Not Currently   Other Topics Concern  . Not on file   Social History Narrative   Lives in [place], [place] with husband.  Active around the house.  Right-handed   Caffeine: 3-4 glasses tea per day    Family History  Problem Relation Age of Onset  . Aneurysm Father     died @ 28.  Marland Kitchen Heart attack Mother     died @ 46.  . Thyroid disease Mother   . Lung cancer Mother   . Hyperlipidemia Mother   . Aneurysm Brother     has had an Aneurysm x3  . Colon cancer Brother   . Bone cancer  Brother   . Lung cancer Brother   . Stomach cancer Brother   . Neuropathy Neg Hx     Past Medical History:  Diagnosis Date  . Automatic implantable cardioverter-defibrillator in situ   . Biventricular defibrillator  [employer]tronic 05/30/2013  . Chest pain    a. Nl cath in the 90's;  b. 04/2005: Myoview: subtle areao of ? reversibility in apical segment of anteromedial LV - ? attenuation, EF 54%;  c. 05/2012 MV: small, fixed mild apical septal perfusion defect, ? attenuation due to lbbb, no ischemia.  . CHF (congestive heart failure) ([place])   . Chronic systolic heart failure ([place])   . Complication of anesthesia    difficult to wake up and blood pressure drops  . Family history of adverse reaction to anesthesia    difficulty to wake up (daughter)  . GERD (gastroesophageal reflux disease)   . Hypercholesterolemia   . Hypertension   . Hypothyroidism   . Left bundle branch block   . LV lead partial dislodgment 05/30/2013  . Neurogenic claudication due to lumbar spinal stenosis   . Non-ischemic cardiomyopathy ([place])    [address] 10/04/12: Normal coronary arteries, EF 25-30% with global HK.  Echocardiogram 10/05/12: EF 30-35%, diffuse HK, moderate LAE. Echocardiogram (01/2013): Lateral to septal wall dyssynchrony EF 20-25%, diffuse HK  . Osteoarthritis    "about all over" (02/22/2013)  . Shortness of breath dyspnea    occasional b/c of CHF  . Spinal stenosis   . Vitamin D deficiency     Past Surgical History:  Procedure Laterality Date  . APPENDECTOMY    . BI-VENTRICULAR IMPLANTABLE CARDIOVERTER DEFIBRILLATOR N/A 02/22/2013   Procedure: BI-VENTRICULAR IMPLANTABLE CARDIOVERTER DEFIBRILLATOR  (CRT-D);  Surgeon: Deboraha Sprang, MD;  Location: [facility] CATH LAB;  Service: Cardiovascular;  Laterality: N/A;  . BLEPHAROPLASTY Bilateral   . CARDIAC CATHETERIZATION  2000  . CARDIAC DEFIBRILLATOR PLACEMENT  02/22/2013   with LV lead placement and HV lead testing/notes 02/22/2013  . EXCISIONAL HEMORRHOIDECTOMY    . FOOT  ARTHRODESIS, MODIFIED [place] Bilateral   . KNEE ARTHROSCOPY Left   . KNEE ARTHROSCOPY Right   . LEFT HEART CATHETERIZATION WITH CORONARY ANGIOGRAM N/A 10/04/2012   Procedure: LEFT HEART CATHETERIZATION WITH CORONARY ANGIOGRAM;  Surgeon: Peter M Martinique, MD;  Location: [facility] CATH LAB;  Service: Cardiovascular;  Laterality: N/A;  . LUMBAR LAMINECTOMY  11/03/2006    Bilateral 3, 4, 5 laminectomy, partial L2, decompression of the thecal sac, foraminotomy, posterolateral arthrodesis L3-S1 with autograft   -- SURGEON:  Leeroy Cha, M.D.   . TONSILLECTOMY    . TOTAL KNEE ARTHROPLASTY Left 07/15/2015   Procedure: LEFT TOTAL KNEE ARTHROPLASTY;  Surgeon: Frederik Pear, MD;  Location: [facility];  Service: Orthopedics;  Laterality: Left;  . TUBAL LIGATION    . VAGINAL HYSTERECTOMY      Current Outpatient Prescriptions  Medication Sig Dispense Refill  . acetaminophen (TYLENOL) 650 MG CR tablet Take 650 mg by mouth every 8 (eight) hours as needed for pain.    Marland Kitchen  Ascorbic Acid (VITAMIN C) 1000 MG tablet Take 1,000 mg by mouth daily.     . B Complex Vitamins (VITAMIN B COMPLEX PO) Take 1 tablet by mouth daily. Reported on 01/30/2015    . carvedilol (COREG) 6.25 MG tablet TAKE ONE TABLET BY MOUTH TWICE DAILY 180 tablet 3  . Coenzyme Q10 (COQ10) 100 MG CAPS Take 1 capsule by mouth daily.    . DULoxetine (CYMBALTA) 30 MG capsule     . ENTRESTO 24-26 MG TAKE ONE TABLET BY MOUTH TWICE DAILY 60 tablet 6  . levothyroxine (SYNTHROID, LEVOTHROID) 50 MCG tablet Take 50 mcg by mouth daily.      . Magnesium Oxide (MAG-200 PO) Take 1 tablet by mouth 2 (two) times daily.     . multivitamin (THERAGRAN) per tablet Take 1 tablet by mouth daily.      . vitamin E 400 UNIT capsule Take 400 Units by mouth daily.    Marland Kitchen gabapentin (NEURONTIN) 100 MG capsule Take 2 capsules (200 mg total) by mouth at bedtime. 60 capsule 11  . spironolactone (ALDACTONE) 25 MG tablet Take 1 tablet (25 mg total) by mouth daily. 90 tablet 3   No current  facility-administered medications for this visit.     Allergies as of 06/01/2016 - Review Complete 06/01/2016  Allergen Reaction Noted  . Statins Other (See Comments) 10/03/2012  . Niaspan [niacin er]  08/18/2010  . Benadryl [diphenhydramine] Other (See Comments) 07/03/2015  . Dilaudid [hydromorphone hcl] Itching 07/16/2015    Vitals: BP 119/70   Pulse 65   Ht _0  (1.702 m)   Wt 184 lb 12.8 oz (83.8 kg)   BMI 28.94 kg/m  Last Weight:  Wt Readings from Last 1 Encounters:  06/01/16 184 lb 12.8 oz (83.8 kg)   Last Height:   Ht Readings from Last 1 Encounters:  06/01/16 _1  (1.702 m)   Physical exam: Exam: Gen: NAD, conversant, well nourised, obese, well groomed                     CV: RRR, +SEM. No Carotid Bruits. No peripheral edema, warm, nontender Eyes: Conjunctivae clear without exudates or hemorrhage  Neuro: Detailed Neurologic Exam  Speech:    Speech is normal; fluent and spontaneous with normal comprehension.  Cognition:    The patient is oriented to person, place, and time;     recent and remote memory intact;     language fluent;     normal attention, concentration,     fund of knowledge Cranial Nerves:    The pupils are equal, round, and reactive to light. Attempted could not visualize fundi due to small pupils. Visual fields are full to finger confrontation. Extraocular movements are intact. Trigeminal sensation is intact and the muscles of mastication are normal. The face is symmetric. The palate elevates in the midline. Hearing intact. Voice is normal. Shoulder shrug is normal. The tongue has normal motion without fasciculations.   Coordination:    No dysmetria  Gait:    Imbalance, cannot heel or toe walk due to imbalance  Motor Observation:    No asymmetry, no atrophy, and no involuntary movements noted. Tone:    Normal muscle tone.    Posture:    Posture is normal. normal erect    Strength: Mild proximal LE weakness. Otherwise strength is  V/V in the upper and lower limbs.      Sensation: decreased pin prick to the knees, intact in the uppers. +Romberg  Reflex Exam:  DTR's:    Absent Ajs, hypo patellars. Deep tendon reflexes in the upper extremities are normal bilaterally.   Toes:    The toes are downgoing bilaterally.   Clonus:    Clonus is absent.       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 Will perform an extensive lab panel. Needs physical therapy for imbalance and risk of falls. Also discussed emg/ncs, will order to rule out other causes such as cidp, however unlikely, given her profound loss of vibration and proprioception in the feet. Patient has a positive Romberg, has had near falls, significant imbalance likely due to neuropathy/numbness in feet will refer to physical therapy for balance therapy  Orders Placed This Encounter  Procedures  . Hemoglobin A1c  . B. burgdorfi Antibody  . Vitamin B1  . Methylmalonic acid, serum  . Angiotensin converting enzyme  . ANA w/Reflex  . Sjogren's syndrome antibods(ssa + ssb)  . B12 and Folate Panel  . Rheumatoid factor  . Heavy metals, blood  . Vitamin B6  . Multiple Myeloma Panel (SPEP&IFE w/QIG)  . Basic Metabolic Panel  . Ambulatory referral to Physical Therapy  . NCV with EMG(electromyography)    Sarina Ill, MD  [facility] [address] [place], [place] [address]  Phone [phone] Fax [phone]

## 2016-06-02 ENCOUNTER — Encounter (HOSPITAL_COMMUNITY): Payer: Self-pay

## 2016-06-02 DIAGNOSIS — I509 Heart failure, unspecified: Secondary | ICD-10-CM | POA: Insufficient documentation

## 2016-06-04 ENCOUNTER — Encounter (HOSPITAL_COMMUNITY)
Admission: RE | Admit: 2016-06-04 | Discharge: 2016-06-04 | Disposition: A | Discharge status: Home / Self Care | Payer: Self-pay | Source: Ambulatory Visit | Attending: Internal Medicine | Admitting: Internal Medicine

## 2016-06-05 ENCOUNTER — Encounter: Payer: Self-pay | Admitting: Physical Therapy

## 2016-06-05 ENCOUNTER — Ambulatory Visit: Payer: Medicare HMO | Attending: Neurology | Admitting: Physical Therapy

## 2016-06-05 DIAGNOSIS — Z9181 History of falling: Secondary | ICD-10-CM | POA: Diagnosis present

## 2016-06-05 DIAGNOSIS — R42 Dizziness and giddiness: Secondary | ICD-10-CM | POA: Diagnosis present

## 2016-06-05 DIAGNOSIS — R208 Other disturbances of skin sensation: Secondary | ICD-10-CM

## 2016-06-05 DIAGNOSIS — R2689 Other abnormalities of gait and mobility: Secondary | ICD-10-CM | POA: Insufficient documentation

## 2016-06-05 DIAGNOSIS — M6281 Muscle weakness (generalized): Secondary | ICD-10-CM | POA: Insufficient documentation

## 2016-06-05 LAB — VITAMIN B1: Thiamine: 152.1 nmol/L (ref 66.5–200.0)

## 2016-06-05 LAB — BASIC METABOLIC PANEL
BUN/Creatinine Ratio: 32 — ABNORMAL HIGH (ref 12–28)
BUN: 24 mg/dL (ref 8–27)
CO2: 26 mmol/L (ref 18–29)
CREATININE: 0.74 mg/dL (ref 0.57–1.00)
Calcium: 9.6 mg/dL (ref 8.7–10.3)
Chloride: 101 mmol/L (ref 96–106)
GFR calc Af Amer: 87 mL/min/{1.73_m2} (ref >59)
GFR, EST NON AFRICAN AMERICAN: 76 mL/min/{1.73_m2} (ref >59)
GLUCOSE: 112 mg/dL — AB (ref 65–99)
Potassium: 4.8 mmol/L (ref 3.5–5.2)
SODIUM: 144 mmol/L (ref 134–144)

## 2016-06-05 LAB — MULTIPLE MYELOMA PANEL, SERUM
ALPHA 1: 0.2 g/dL (ref 0.0–0.4)
ALPHA2 GLOB SERPL ELPH-MCNC: 0.6 g/dL (ref 0.4–1.0)
Albumin SerPl Elph-Mcnc: 4.3 g/dL (ref 2.9–4.4)
Albumin/Glob SerPl: 1.5 (ref 0.7–1.7)
B-Globulin SerPl Elph-Mcnc: 1.1 g/dL (ref 0.7–1.3)
Gamma Glob SerPl Elph-Mcnc: 1 g/dL (ref 0.4–1.8)
Globulin, Total: 2.9 g/dL (ref 2.2–3.9)
IGA/IMMUNOGLOBULIN A, SERUM: 276 mg/dL (ref 64–422)
IGG (IMMUNOGLOBIN G), SERUM: 1013 mg/dL (ref 700–1600)
IGM (IMMUNOGLOBULIN M), SRM: 48 mg/dL (ref 26–217)
TOTAL PROTEIN: 7.2 g/dL (ref 6.0–8.5)

## 2016-06-05 LAB — SJOGREN'S SYNDROME ANTIBODS(SSA + SSB)

## 2016-06-05 LAB — VITAMIN B6: VITAMIN B6: 72.3 ug/L — AB (ref 2.0–32.8)

## 2016-06-05 LAB — HEAVY METALS, BLOOD
Arsenic: 6 ug/L (ref 2–23)
Lead, Blood: NOT DETECTED ug/dL (ref 0–19)
Mercury: NOT DETECTED ug/L (ref 0.0–14.9)

## 2016-06-05 LAB — ANGIOTENSIN CONVERTING ENZYME: ANGIO CONVERT ENZYME: 40 U/L (ref 14–82)

## 2016-06-05 LAB — B12 AND FOLATE PANEL
Folate: >20 ng/mL (ref >3.0)
VITAMIN B 12: 809 pg/mL (ref 232–1245)

## 2016-06-05 LAB — ANA W/REFLEX: ANA: NEGATIVE

## 2016-06-05 LAB — B. BURGDORFI ANTIBODIES

## 2016-06-05 LAB — HEMOGLOBIN A1C
ESTIMATED AVERAGE GLUCOSE: 117 mg/dL
HEMOGLOBIN A1C: 5.7 % — AB (ref 4.8–5.6)

## 2016-06-05 LAB — RHEUMATOID FACTOR: Rhuematoid fact SerPl-aCnc: <10 IU/mL (ref 0.0–13.9)

## 2016-06-05 LAB — METHYLMALONIC ACID, SERUM: Methylmalonic Acid: 159 nmol/L (ref 0–378)

## 2016-06-05 NOTE — Therapy (Addendum)
[facility] [address] [place], [place], [address] Phone: [phone]   Fax:  [phone]  Physical Therapy Evaluation  Patient Details  Name: Heather Smith MRN: [id number] Date of Birth: [birth year] Referring Provider: Dr. Sarina Ill  Encounter Date: 06/05/2016      PT End of Session - 06/05/16 1608    Visit Number 1   Number of Visits 17   Date for PT Re-Evaluation 07/31/16   Authorization Type [employer] MCR  G-Code & PN every 10th visit   Authorization Time Period 06-05-16 to 08-04-16   PT Start Time 0935   PT Stop Time 1015   PT Time Calculation (min) 40 min   Equipment Utilized During Treatment Gait belt   Activity Tolerance Patient tolerated treatment well   Behavior During Therapy [facility] for tasks assessed/performed      Past Medical History:  Diagnosis Date  . Automatic implantable cardioverter-defibrillator in situ   . Biventricular defibrillator  Medtronic 05/30/2013  . Chest pain    a. Nl cath in the 90's;  b. 04/2005: Myoview: subtle areao of ? reversibility in apical segment of anteromedial LV - ? attenuation, EF 54%;  c. 05/2012 MV: small, fixed mild apical septal perfusion defect, ? attenuation due to lbbb, no ischemia.  . CHF (congestive heart failure) ([place])   . Chronic systolic heart failure ([place])   . Complication of anesthesia    difficult to wake up and blood pressure drops  . Family history of adverse reaction to anesthesia    difficulty to wake up (daughter)  . GERD (gastroesophageal reflux disease)   . Hypercholesterolemia   . Hypertension   . Hypothyroidism   . Left bundle branch block   . LV lead partial dislodgment 05/30/2013  . Neurogenic claudication due to lumbar spinal stenosis   . Non-ischemic cardiomyopathy ([place])    [place] 10/04/12: Normal coronary arteries, EF 25-30% with global HK.  Echocardiogram 10/05/12: EF 30-35%, diffuse HK, moderate LAE. Echocardiogram (01/2013): Lateral to septal  wall dyssynchrony EF 20-25%, diffuse HK  . Osteoarthritis    "about all over" (02/22/2013)  . Shortness of breath dyspnea    occasional b/c of CHF  . Spinal stenosis   . Vitamin D deficiency     Past Surgical History:  Procedure Laterality Date  . APPENDECTOMY    . BI-VENTRICULAR IMPLANTABLE CARDIOVERTER DEFIBRILLATOR N/A 02/22/2013   Procedure: BI-VENTRICULAR IMPLANTABLE CARDIOVERTER DEFIBRILLATOR  (CRT-D);  Surgeon: Deboraha Sprang, MD;  Location: [facility] CATH LAB;  Service: Cardiovascular;  Laterality: N/A;  . BLEPHAROPLASTY Bilateral   . CARDIAC CATHETERIZATION  2000  . CARDIAC DEFIBRILLATOR PLACEMENT  02/22/2013   with LV lead placement and HV lead testing/notes 02/22/2013  . EXCISIONAL HEMORRHOIDECTOMY    . FOOT ARTHRODESIS, MODIFIED [place] Bilateral   . KNEE ARTHROSCOPY Left   . KNEE ARTHROSCOPY Right   . LEFT HEART CATHETERIZATION WITH CORONARY ANGIOGRAM N/A 10/04/2012   Procedure: LEFT HEART CATHETERIZATION WITH CORONARY ANGIOGRAM;  Surgeon: Peter M Martinique, MD;  Location: [facility] CATH LAB;  Service: Cardiovascular;  Laterality: N/A;  . LUMBAR LAMINECTOMY  11/03/2006    Bilateral 3, 4, 5 laminectomy, partial L2, decompression of the thecal sac, foraminotomy, posterolateral arthrodesis L3-S1 with autograft   -- SURGEON:  Leeroy Cha, M.D.   . TONSILLECTOMY    . TOTAL KNEE ARTHROPLASTY Left 07/15/2015   Procedure: LEFT TOTAL KNEE ARTHROPLASTY;  Surgeon: Frederik Pear, MD;  Location: [facility];  Service: Orthopedics;  Laterality: Left;  . TUBAL  LIGATION    . VAGINAL HYSTERECTOMY      There were no vitals filed for this visit.       Subjective Assessment - 06/05/16 0935    Subjective I'm not steady on my feet. Denies falls. Goes to "cardiac" rehab 2x/wk (insurance wouldn't cover cardiac, but would cover pulmonary). Has never done PT.             [facility] PT Assessment - 06/05/16 0001      Assessment   Medical Diagnosis imbalance, peripheral neuropathy   Referring Provider Dr. Sarina Ill   Onset Date/Surgical Date --  Sept 2015   Prior Therapy Cardiac Rehab     Precautions   Precautions None     Balance Screen   Has the patient fallen in the past 6 months Yes   Has the patient had a decrease in activity level because of a fear of falling?  Yes  Fearful take care of great-grandbabies   Is the patient reluctant to leave their home because of a fear of falling?  No     Home Environment   Living Environment Private residence   Living Arrangements Spouse/significant other   Available Help at Discharge Family;Available PRN/intermittently   Home Access Stairs to enter   Entrance Stairs-Number of Steps 5   Entrance Stairs-Rails Left   Home Layout One level   Home Equipment None     Prior Function   Level of Independence Independent  does not climb ladder into attic   Vocation Retired   Environmental consultant at [employer]; family-taking care of grandbabies, crafts     Cognition   Overall Cognitive Status Within Functional Limits for tasks assessed     Observation/Other Assessments   Focus on Therapeutic Outcomes (FOTO)  no      Observation/Other Assessments-Edema    Edema --  never even with CHF     Transfers   Transfers Sit to Stand;Stand to Sit   Sit to Stand 4: Min guard;Without upper extremity assist   Five time sit to stand comments  16.84  norm for age 13.2 seconds   Stand to Sit 4: Min guard;Without upper extremity assist   Comments throughout evaluation multiple transfers with pt having sway and even staggering steps at times     Ambulation/Gait   Ambulation/Gait Yes   Ambulation/Gait Assistance 4: Min guard   Ambulation Distance (Feet) 60 Feet  40, 100   Assistive device None   Gait Pattern Step-through pattern;Decreased step length - right;Decreased step length - left;Wide base of support  varying step length, staggering left or right   Ambulation Surface Level;Indoor     Standardized Balance Assessment   Standardized Balance  Assessment Timed Up and Go Test;Berg Balance Test     Berg Balance Test   Sit to Stand Able to stand without using hands and stabilize independently   Standing Unsupported Able to stand 2 minutes with supervision   Sitting with Back Unsupported but Feet Supported on Floor or Stool Able to sit safely and securely 2 minutes   Stand to Sit Sits safely with minimal use of hands   Transfers Able to transfer safely, definite need of hands   Standing Unsupported with Eyes Closed Needs help to keep from falling   Standing Ubsupported with Feet Together Needs help to attain position but able to stand for 30 seconds with feet together   From Standing, Reach Forward with Outstretched Arm Can reach forward >12 cm safely (5")  From Standing Position, Pick up Object from [place] to pick up shoe, needs supervision   From Standing Position, Turn to Look Behind Over each Shoulder Needs supervision when turning   Turn 360 Degrees Needs close supervision or verbal cueing   Standing Unsupported, Alternately Place Feet on Step/Stool Able to complete >2 steps/needs minimal assist  did 8 steps in <22 sec, however min assist for posterior LOB   Standing Unsupported, One Foot in [place] help to step but can hold 15 seconds   Standing on One Leg Tries to lift leg/unable to hold 3 seconds but remains standing independently   Total Score 30     Timed Up and Go Test   Normal TUG (seconds) 12.47                           PT Education - 06/05/16 1606    Education provided Yes   Education Details results of evaluation and POC; multiple LOB during eval and Merrilee Jansky indicates she should use a walker full-time (which she is not interested in using); discussed use of a cane and how she will gain sensory input via her hand that her feet cannot give due to neuropathy   Person(s) Educated Patient   Methods Explanation;Demonstration   Comprehension Verbalized understanding;Need further instruction           PT Short Term Goals - 06/05/16 1628      PT SHORT TERM GOAL #1   Title Patient will confirm that she completed a home assessment to reduce risk of falls (based on Fall Prevention handout provided). (TARGET all STGs 07-03-16)   Time 4   Period Weeks   Status New     PT SHORT TERM GOAL #2   Title Patient will agree to use of a walker vs cane and will demonstrate safe use of device for transfers and gait.    Time 4   Period Weeks   Status New     PT SHORT TERM GOAL #3   Title Patient will demonstrate safe floor to sit/standing transfer modified independent.    Time 4   Period Weeks   Status New     PT SHORT TERM GOAL #4   Title At 4 week re-assessment, measure FGA and set long-term goal as appropriate.   Time 4   Period Weeks   Status New     PT SHORT TERM GOAL #5   Title Patient will improve Berg score to 35/56 to show decreasing fall risk   Time 4   Period Weeks   Status New           PT Long Term Goals - 06/05/16 1636      PT LONG TERM GOAL #1   Title Patient will ambulate >= 500 ft on level/paved indoor or outdoor surfaces modified independent with appropriate use of assistive device. (TARGET for all LTGs 07-31-16)   Time 8   Period Weeks   Status New     PT LONG TERM GOAL #2   Title Patient will improve Berg score to 40/56 to demonstrate improved balance and decreased fall risk   Time 8   Period Weeks   Status New     PT LONG TERM GOAL #3   Title Patient will be independent with HEP and have a plan for continued exercise/activity upon discharge from PT   Time 8   Period Weeks   Status New  PT LONG TERM GOAL #4   Title Patient will improve FGA score by 4 points above baseline score established at 4 week assessment.   Time 8   Period Weeks   Status New               Plan - 06/05/16 1611    Clinical Impression Statement Patient presents as a referral for imbalance and peripheral neuropathy. She presents with unsteady gait (drifting  off path and staggering steps including bumping into the wall). She also displays LE weakness and decreased sensation which contribute to her poor balance. Berg Balance score was 30/56 (with <45 indicating higher fall risk and <39 indicates need for walker full-time). She demonstrates decr safety awareness in that she refuses use of walker (and will consider use of cane) despite frank discussion re: fall risk and potential life-altering injuries. Anticipate she can benefit from skilled PT interventions to decrease her fall risk.    Rehab Potential Good   Clinical Impairments Affecting Rehab Potential decrease safety awareness; significant bil neuropathy   PT Frequency 2x / week   PT Duration 8 weeks   PT Treatment/Interventions ADLs/Self Care Home Management;DME Instruction;Gait training;Stair training;Functional mobility training;Therapeutic activities;Therapeutic exercise;Balance training;Neuromuscular re-education;Cognitive remediation;Patient/family education;Orthotic Fit/Training;Vestibular;Visual/perceptual remediation/compensation   PT Next Visit Plan Hopefully she comes with a cane! Educate in safe use of cane; give fall prevention handout and ask her to complete assessment at home; measure gait velocity; ask about motion sensitivity ? vestibular screen (she mentioned balance is worse in target)   Consulted and Agree with Plan of Care Patient      Patient will benefit from skilled therapeutic intervention in order to improve the following deficits and impairments:  Abnormal gait, Decreased balance, Decreased cognition, Decreased coordination, Decreased knowledge of use of DME, Decreased safety awareness, Decreased strength, Difficulty walking, Impaired sensation  Visit Diagnosis: Other abnormalities of gait and mobility - Plan: PT plan of care cert/re-cert  History of falling - Plan: PT plan of care cert/re-cert  Other disturbances of skin sensation - Plan: PT plan of care  cert/re-cert  Muscle weakness (generalized) - Plan: PT plan of care cert/re-cert  Dizziness and giddiness - Plan: PT plan of care cert/re-cert      G-Codes - 82/99/37 1645    Functional Assessment Tool Used (Outpatient Only) Berg 30/56; TUG 12.47 sec   Functional Limitation Mobility: Walking and moving around   Mobility: Walking and Moving Around Current Status (J6967) At least 20 percent but less than 40 percent impaired, limited or restricted   Mobility: Walking and Moving Around Goal Status (E9381) At least 1 percent but less than 20 percent impaired, limited or restricted       Problem List Patient Active Problem List   Diagnosis Date Noted  . Pre-syncope 07/17/2015  . Chronic diastolic congestive heart failure ([place])   . Primary osteoarthritis of left knee 07/13/2015  . Abdominal pain 03/05/2015  . Depression 06/06/2013  . Biventricular defibrillator  Medtronic 05/30/2013  . LV lead partial dislodgment 05/30/2013  . Nonischemic cardiomyopathy ([place]) 02/08/2013  . PVC (premature ventricular contraction) 02/08/2013  . LBBB (left bundle branch block) 02/08/2013  . Sinus bradycardia 02/08/2013  . Chronic systolic heart failure ([place]) 12/15/2012  . Precordial pain 10/05/2012  . HTN (hypertension) 08/22/2010  . Hyperlipemia 08/22/2010    Rexanne Mano, PT 06/05/2016, 4:49 PM  [facility] [address] [place], [place], [address] Phone: [phone]   Fax:  [phone]  Name: Heather  JUNITA Smith MRN: [id number] Date of Birth: 1932/12/26

## 2016-06-08 ENCOUNTER — Telehealth: Payer: Self-pay

## 2016-06-08 NOTE — Telephone Encounter (Signed)
**Note De-Identified Royce Sciara Obfuscation** I have filled out the provider portion of the pt assistance form and printed RX for the pts Eliquis.to fax to [facility] once Dr Caryl Comes has signed the RX and pt assistance form that has been placed in his mail bin.

## 2016-06-08 NOTE — Telephone Encounter (Signed)
-----   Message from Melvenia Beam, MD sent at 06/06/2016  9:55 AM EDT ----- Labs unremrkable. Her hgba1c is mildly abnormal 5.7. She appears a little dehydrated, increase fluids.  Her B6 is elevated, this is a vitamin that you can actually get too much of just make sure she is not supplementing with more than 100mg  a day. Her B12 looks great. thanks

## 2016-06-08 NOTE — Telephone Encounter (Signed)
Called pt w/ lab results and recommendations. May call back w/ additional questions/concerns.

## 2016-06-09 ENCOUNTER — Encounter (HOSPITAL_COMMUNITY)
Admission: RE | Admit: 2016-06-09 | Discharge: 2016-06-09 | Disposition: A | Discharge status: Home / Self Care | Payer: Self-pay | Source: Ambulatory Visit | Attending: Cardiology | Admitting: Cardiology

## 2016-06-10 ENCOUNTER — Telehealth: Payer: Self-pay

## 2016-06-10 NOTE — Telephone Encounter (Signed)
**Note De-Identified Heather Smith Obfuscation** PA for Entresto done through covermymeds. Awaiting response.

## 2016-06-11 ENCOUNTER — Encounter (HOSPITAL_COMMUNITY)
Admission: RE | Admit: 2016-06-11 | Discharge: 2016-06-11 | Disposition: A | Discharge status: Home / Self Care | Payer: Self-pay | Source: Ambulatory Visit | Attending: Internal Medicine | Admitting: Internal Medicine

## 2016-06-11 NOTE — Telephone Encounter (Signed)
Received fax from [employer] regarding ENTRESTO prior authorization is not required for this medication.

## 2016-06-12 ENCOUNTER — Ambulatory Visit: Payer: Medicare HMO | Admitting: Physical Therapy

## 2016-06-12 ENCOUNTER — Encounter: Payer: Self-pay | Admitting: Physical Therapy

## 2016-06-12 DIAGNOSIS — R2689 Other abnormalities of gait and mobility: Secondary | ICD-10-CM

## 2016-06-12 DIAGNOSIS — R208 Other disturbances of skin sensation: Secondary | ICD-10-CM

## 2016-06-12 DIAGNOSIS — M6281 Muscle weakness (generalized): Secondary | ICD-10-CM

## 2016-06-12 NOTE — Patient Instructions (Addendum)
Do these exercises with your back to the corner walls and a chair in front of you to be safe!   Feet Apart (Compliant Surface) Head Motion - Eyes Closed    Stand on compliant surface:  with feet shoulder width apart. Close eyes  30 seconds. Repeat x 3. Eyes closed move head slowly, up and down x 10 reps. Right and left x 10 reps.  Do 1 sessions per day.  Copyright  VHI. All rights reserved.  Feet Together (Compliant Surface) Head Motion - Eyes Closed    Stand on compliant surface:  with feet together. Close eyes  30 seconds. Repeat x 3. Eyes closed move head slowly, up and down x 10 reps. Right and left x 10 reps.  Do 1 sessions per day.   Copyright  VHI. All rights reserved.  Feet Partial Heel-Toe (Compliant Surface) Head Motion - Eyes Closed    Stand on compliant surface:  with right foot partially in front of the other. Close eyes  30 seconds. Repeat x 3. Switch your feet with left foot in front.  Close eyes  30 seconds. Repeat x 3 Do 1 sessions per day.  As this gets easier, Eyes closed move head slowly, up and down x 10 reps. Right and left x 10 reps.  Copyright  VHI. All rights reserved.   ABDUCTION: Standing (Active)    Stand, feet flat. Lift right leg out to side. Keep your torso/shoulders upright (do not lean left or right). Keep toes pointing forward--think of lifting the back part of your heel up towards the ceiling. Complete _1__ sets of __20_ repetitions.   http://gtsc.exer.us/111   Copyright  VHI. All rights reserved.   Heel Raises    Stand with support. Tighten core/abdomen and hold. Stand on one foot and with knee straight, raise heel off ground. Hold __2_ seconds and lower. Repeat __10_ times. Switch and do the other leg. Do __1_ times a day.  Copyright  VHI. All rights reserved.

## 2016-06-12 NOTE — Therapy (Signed)
[facility] [address] [place], [place], [address] Phone: [phone]   Fax:  [phone]  Physical Therapy Treatment  Patient Details  Name: Heather Smith MRN: [id number] Date of Birth: 28-Dec-1932 Referring Provider: Dr. Sarina Ill  Encounter Date: 06/12/2016      PT End of Session - 06/12/16 1716    Visit Number 2   Number of Visits 9  on first visit requested decr to 1x/wk due to co-pay   Date for PT Re-Evaluation 07/31/16   Authorization Type [employer] MCR  G-Code & PN every 10th visit   Authorization Time Period 06-05-16 to 08-04-16   PT Start Time 0930   PT Stop Time 1017   PT Time Calculation (min) 47 min   Equipment Utilized During Treatment Gait belt   Activity Tolerance Patient tolerated treatment well   Behavior During Therapy [facility] for tasks assessed/performed      Past Medical History:  Diagnosis Date  . Automatic implantable cardioverter-defibrillator in situ   . Biventricular defibrillator  Medtronic 05/30/2013  . Chest pain    a. Nl cath in the 90's;  b. 04/2005: Myoview: subtle areao of ? reversibility in apical segment of anteromedial LV - ? attenuation, EF 54%;  c. 05/2012 MV: small, fixed mild apical septal perfusion defect, ? attenuation due to lbbb, no ischemia.  . CHF (congestive heart failure) ([place])   . Chronic systolic heart failure ([place])   . Complication of anesthesia    difficult to wake up and blood pressure drops  . Family history of adverse reaction to anesthesia    difficulty to wake up (daughter)  . GERD (gastroesophageal reflux disease)   . Hypercholesterolemia   . Hypertension   . Hypothyroidism   . Left bundle branch block   . LV lead partial dislodgment 05/30/2013  . Neurogenic claudication due to lumbar spinal stenosis   . Non-ischemic cardiomyopathy ([place])    [place] 10/04/12: Normal coronary arteries, EF 25-30% with global HK.  Echocardiogram 10/05/12: EF 30-35%, diffuse HK,  moderate LAE. Echocardiogram (01/2013): Lateral to septal wall dyssynchrony EF 20-25%, diffuse HK  . Osteoarthritis    "about all over" (02/22/2013)  . Shortness of breath dyspnea    occasional b/c of CHF  . Spinal stenosis   . Vitamin D deficiency     Past Surgical History:  Procedure Laterality Date  . APPENDECTOMY    . BI-VENTRICULAR IMPLANTABLE CARDIOVERTER DEFIBRILLATOR N/A 02/22/2013   Procedure: BI-VENTRICULAR IMPLANTABLE CARDIOVERTER DEFIBRILLATOR  (CRT-D);  Surgeon: Deboraha Sprang, MD;  Location: [facility] CATH LAB;  Service: Cardiovascular;  Laterality: N/A;  . BLEPHAROPLASTY Bilateral   . CARDIAC CATHETERIZATION  2000  . CARDIAC DEFIBRILLATOR PLACEMENT  02/22/2013   with LV lead placement and HV lead testing/notes 02/22/2013  . EXCISIONAL HEMORRHOIDECTOMY    . FOOT ARTHRODESIS, MODIFIED [place] Bilateral   . KNEE ARTHROSCOPY Left   . KNEE ARTHROSCOPY Right   . LEFT HEART CATHETERIZATION WITH CORONARY ANGIOGRAM N/A 10/04/2012   Procedure: LEFT HEART CATHETERIZATION WITH CORONARY ANGIOGRAM;  Surgeon: Peter M Martinique, MD;  Location: [facility] CATH LAB;  Service: Cardiovascular;  Laterality: N/A;  . LUMBAR LAMINECTOMY  11/03/2006    Bilateral 3, 4, 5 laminectomy, partial L2, decompression of the thecal sac, foraminotomy, posterolateral arthrodesis L3-S1 with autograft   -- SURGEON:  Leeroy Cha, M.D.   . TONSILLECTOMY    . TOTAL KNEE ARTHROPLASTY Left 07/15/2015   Procedure: LEFT TOTAL KNEE ARTHROPLASTY;  Surgeon: Frederik Pear, MD;  Location:  [facility] OR;  Service: Orthopedics;  Laterality: Left;  . TUBAL LIGATION    . VAGINAL HYSTERECTOMY      There were no vitals filed for this visit.      Subjective Assessment - 06/12/16 0930    Subjective Reported due to her co-payment she cannot afford to come twice per week. Agreed to come 1x/week. No falls. Reports she feels her dizziness is due to a medicine she is taking, but doesn't know which one is a problem. States she feels off-balance when entering  large stores, but feels it has to do with her vision (moving from sunlight into darker area).    Currently in Pain? No/denies                         [facility] Adult PT Treatment/Exercise - 06/12/16 1701      Transfers   Transfers Sit to Stand;Stand to Sit   Sit to Stand 5: Supervision;With upper extremity assist   Stand to Sit 6: Modified independent (Device/Increase time);With upper extremity assist     Ambulation/Gait   Ambulation/Gait Assistance 4: Min guard   Ambulation Distance (Feet) 240 Feet   Assistive device None   Gait Pattern Step-through pattern;Decreased step length - right;Decreased step length - left;Wide base of support   Ambulation Surface Level;Indoor   Gait velocity 20 ft/6.16 sec= 3.25 ft/sec     Exercises   Exercises Knee/Hip;Ankle     Knee/Hip Exercises: Standing   Hip Abduction Stengthening;Both;2 sets;10 reps  alternating legs   Other Standing Knee Exercises slow marching with intermittent UE support (2 second hold) x 20     Ankle Exercises: Standing   Heel Raises 10 reps  bil; lt and rt only             Balance Exercises - 06/12/16 1706      Balance Exercises: Standing   Standing Eyes Opened Narrow base of support (BOS);Wide (BOA);Head turns;Foam/compliant surface;Solid surface   Standing Eyes Closed Narrow base of support (BOS);Wide (BOA);Foam/compliant surface;Solid surface   Tandem Stance Eyes open;Eyes closed;Foam/compliant surface;Intermittent upper extremity support           PT Education - 06/12/16 1715    Education provided Yes   Education Details HEP (see pt instructions); multiple components to "balance" system; fall prevention strategies for home   Person(s) Educated Patient   Methods Explanation;Demonstration;Verbal cues;Handout   Comprehension Verbalized understanding;Returned demonstration;Verbal cues required;Need further instruction          PT Short Term Goals - 06/05/16 1628      PT SHORT TERM GOAL  #1   Title Patient will confirm that she completed a home assessment to reduce risk of falls (based on Fall Prevention handout provided). (TARGET all STGs 07-03-16)   Time 4   Period Weeks   Status New     PT SHORT TERM GOAL #2   Title Patient will agree to use of a walker vs cane and will demonstrate safe use of device for transfers and gait.    Time 4   Period Weeks   Status New     PT SHORT TERM GOAL #3   Title Patient will demonstrate safe floor to sit/standing transfer modified independent.    Time 4   Period Weeks   Status New     PT SHORT TERM GOAL #4   Title At 4 week re-assessment, measure FGA and set long-term goal as appropriate.   Time 4  Period Weeks   Status New     PT SHORT TERM GOAL #5   Title Patient will improve Berg score to 35/56 to show decreasing fall risk   Time 4   Period Weeks   Status New           PT Long Term Goals - 06/05/16 1636      PT LONG TERM GOAL #1   Title Patient will ambulate >= 500 ft on level/paved indoor or outdoor surfaces modified independent with appropriate use of assistive device. (TARGET for all LTGs 07-31-16)   Time 8   Period Weeks   Status New     PT LONG TERM GOAL #2   Title Patient will improve Berg score to 40/56 to demonstrate improved balance and decreased fall risk   Time 8   Period Weeks   Status New     PT LONG TERM GOAL #3   Title Patient will be independent with HEP and have a plan for continued exercise/activity upon discharge from PT   Time 8   Period Weeks   Status New     PT LONG TERM GOAL #4   Title Patient will improve FGA score by 4 points above baseline score established at 4 week assessment.   Time 8   Period Weeks   Status New               Plan - 06/12/16 1717    Clinical Impression Statement Session focused on establishing a HEP and balance training. Patient now indicated she needs to decrease her frequency to 1x/week due to finances/co-pay. Goals re-addressed. Will continue to  work towards goals.    Rehab Potential Good   Clinical Impairments Affecting Rehab Potential decrease safety awareness; significant bil neuropathy   PT Frequency 1x / week  decr per pt request   PT Duration 8 weeks   PT Treatment/Interventions ADLs/Self Care Home Management;DME Instruction;Gait training;Stair training;Functional mobility training;Therapeutic activities;Therapeutic exercise;Balance training;Neuromuscular re-education;Cognitive remediation;Patient/family education;Orthotic Fit/Training;Vestibular;Visual/perceptual remediation/compensation   PT Next Visit Plan measure BP during session (she states sometimes drops at cardiac rehab); see if she reviewed fall prevention handout and if it helped her make any changes (please check, is a STG); attempt use of cane to see if it decreases her imbalance (she may refuse to even try it!); check if ?'s re: HEP and how corner ex's are going (?need to incr difficulty?)   Consulted and Agree with Plan of Care Patient      Patient will benefit from skilled therapeutic intervention in order to improve the following deficits and impairments:  Abnormal gait, Decreased balance, Decreased cognition, Decreased coordination, Decreased knowledge of use of DME, Decreased safety awareness, Decreased strength, Difficulty walking, Impaired sensation  Visit Diagnosis: Other abnormalities of gait and mobility  Other disturbances of skin sensation  Muscle weakness (generalized)     Problem List Patient Active Problem List   Diagnosis Date Noted  . Pre-syncope 07/17/2015  . Chronic diastolic congestive heart failure ([place])   . Primary osteoarthritis of left knee 07/13/2015  . Abdominal pain 03/05/2015  . Depression 06/06/2013  . Biventricular defibrillator  Medtronic 05/30/2013  . LV lead partial dislodgment 05/30/2013  . Nonischemic cardiomyopathy ([place]) 02/08/2013  . PVC (premature ventricular contraction) 02/08/2013  . LBBB (left bundle branch  block) 02/08/2013  . Sinus bradycardia 02/08/2013  . Chronic systolic heart failure ([place]) 12/15/2012  . Precordial pain 10/05/2012  . HTN (hypertension) 08/22/2010  . Hyperlipemia 08/22/2010    Jeanie Cooks  Jonny Longino, PT 06/12/2016, 5:28 PM  [facility] [address] [place], [place], [address] Phone: [phone]   Fax:  [phone]  Name: Heather Smith MRN: [id number] Date of Birth: Aug 24, 1932

## 2016-06-16 ENCOUNTER — Ambulatory Visit: Payer: Medicare HMO | Admitting: Physical Therapy

## 2016-06-16 ENCOUNTER — Encounter (HOSPITAL_COMMUNITY): Payer: Self-pay

## 2016-06-17 ENCOUNTER — Ambulatory Visit: Payer: Medicare HMO | Admitting: Physical Therapy

## 2016-06-18 ENCOUNTER — Encounter (HOSPITAL_COMMUNITY)
Admission: RE | Admit: 2016-06-18 | Discharge: 2016-06-18 | Disposition: A | Discharge status: Home / Self Care | Payer: Self-pay | Source: Ambulatory Visit | Attending: Internal Medicine | Admitting: Internal Medicine

## 2016-06-23 ENCOUNTER — Encounter (HOSPITAL_COMMUNITY)
Admission: RE | Admit: 2016-06-23 | Discharge: 2016-06-23 | Disposition: A | Discharge status: Home / Self Care | Payer: Self-pay | Source: Ambulatory Visit | Attending: Internal Medicine | Admitting: Internal Medicine

## 2016-06-23 ENCOUNTER — Ambulatory Visit: Payer: Medicare HMO | Admitting: Physical Therapy

## 2016-06-25 ENCOUNTER — Encounter (HOSPITAL_COMMUNITY)
Admission: RE | Admit: 2016-06-25 | Discharge: 2016-06-25 | Disposition: A | Discharge status: Home / Self Care | Payer: Self-pay | Source: Ambulatory Visit | Attending: Internal Medicine | Admitting: Internal Medicine

## 2016-06-26 ENCOUNTER — Ambulatory Visit: Payer: Medicare HMO | Admitting: Physical Therapy

## 2016-06-30 ENCOUNTER — Ambulatory Visit: Payer: Medicare HMO | Admitting: Physical Therapy

## 2016-06-30 ENCOUNTER — Encounter (HOSPITAL_COMMUNITY): Payer: Self-pay

## 2016-07-02 ENCOUNTER — Encounter (HOSPITAL_COMMUNITY): Payer: Self-pay

## 2016-07-03 ENCOUNTER — Ambulatory Visit: Payer: Medicare HMO | Admitting: Physical Therapy

## 2016-07-07 ENCOUNTER — Encounter (HOSPITAL_COMMUNITY): Payer: Self-pay

## 2016-07-07 ENCOUNTER — Ambulatory Visit: Payer: Medicare HMO | Admitting: Physical Therapy

## 2016-07-07 DIAGNOSIS — I509 Heart failure, unspecified: Secondary | ICD-10-CM | POA: Insufficient documentation

## 2016-07-08 ENCOUNTER — Ambulatory Visit (INDEPENDENT_AMBULATORY_CARE_PROVIDER_SITE_OTHER): Payer: Self-pay | Admitting: Neurology

## 2016-07-08 ENCOUNTER — Ambulatory Visit (INDEPENDENT_AMBULATORY_CARE_PROVIDER_SITE_OTHER): Payer: Medicare HMO | Admitting: Neurology

## 2016-07-08 DIAGNOSIS — Z0289 Encounter for other administrative examinations: Secondary | ICD-10-CM

## 2016-07-08 DIAGNOSIS — G629 Polyneuropathy, unspecified: Secondary | ICD-10-CM | POA: Diagnosis not present

## 2016-07-08 DIAGNOSIS — R2689 Other abnormalities of gait and mobility: Secondary | ICD-10-CM

## 2016-07-08 DIAGNOSIS — G609 Hereditary and idiopathic neuropathy, unspecified: Secondary | ICD-10-CM

## 2016-07-08 NOTE — Progress Notes (Signed)
See procedure note.

## 2016-07-08 NOTE — Progress Notes (Addendum)
Full Name: Heather Smith Gender: Female MRN #: [id number] Date of Birth: Sep 04, 2032    Visit Date: 07/08/16 09:47 Age: 83 Years 2 Months Old Examining Physician: Sarina Ill, MD  Referring Physician: Shirline Frees, MD  History: Patient who is evaluated 06/01/2016 for numbness and tingling in the feet.Past medical history of hypertension, prediabetes, hyperlipidemia, hypothyroidism, osteoarthritis, stage III chronic kidney disease and congestive heart failure due to nonischemic cardiomyopathy. She is on vitamin B12 supplementation. Extensive lab panel including hemoglobin A1c, Lyme, vitamin B1, methylmalonic acid, angiotensin-converting enzyme, ANA with reflex, Sjogren's, B12 and folate, rheumatoid factor, heavy metals, multiple myeloma panel were unremarkable. Vitamin B6 was slightly elevated and she was asked not to supplement with too much B6 daily.  Summary:  The right median motor nerve showed decreased amplitude (1.8 mV, N>4). The right peroneal motor nerve showed delayed distal onset peak latency (7.4 ms, N<6.5), borderline amplitude (2 mV,N> 2), reduced conduction velocity (fibular head to ankle, 40 m/s, N>44) and reduced conduction velocity (popliteal fossa to the fibular head, 33 m/s, N>44). The left peroneal motor nerve showed delayed distal onset peak latency (6.5 ms, N<6.5), reduced amplitude (1.9 mV,N> 2), reduced conduction velocity (fibular head to ankle, 40 m/s, N>44).   The right tibial motor nerve showed decreased amplitude (3.6 mV, N>4).  The left tibial motor nerve showed decreased amplitude (3.5 mV, N>4).  The right sural sensory nerve showed reduced amplitude (2 V, N>6) however this may be normal in people aged greater than 47. The left sural sensory nerve showed reduced amplitude (3 V, N>6) however this may be normal in people aged greater than 6. The left and right superficial peroneal sensory nerves showed no response. The right median orthodromic sensory nerve showed  reduced amplitude (7 V, N>10). Right tibial F-wave showed delayed latency (63.1, N< 56). Left tibial F-wave showed delayed latency (58.4, N< 56). The right median/ulnar (palm) comparison nerve showed prolonged  distal peak latency (Ulnar Palm, 2.3 ms, N<2.2) and normal peak latency difference. The remaining nerves (as detailed in the following tables) were within normal limits. The muscles (as detailed in the following tables) were within normal limits.  Conclusion: There is evidence for an axonal, sensorimotor, length-dependent, symmetric polyneuropathy     Sarina Ill, M.D.  [facility] [place], [place] [address] Tel: [phone] Fax: [phone]        [facility]    Nerve / Sites Muscle Latency Ref. Amplitude Ref. Rel Amp Segments Distance Velocity Ref. Area    ms ms mV mV %  cm m/s m/s mVms  R Median - APB     Wrist APB 4.4 ?4.4 1.8 ?4.0 100 Wrist - APB 7   4.5     Upper arm APB 8.8  1.4  77.5 Upper arm - Wrist 24 56 ?49 3.8  R Ulnar - ADM     Wrist ADM 3.0 ?3.3 7.8 ?6.0 100 Wrist - ADM 7   22.8     B.Elbow ADM 6.9  7.0  89.5 B.Elbow - Wrist 21 54 ?49 21.4     A.Elbow ADM 9.1  6.8  97.5 A.Elbow - B.Elbow 12 54 ?49 21.5         A.Elbow - Wrist      R Peroneal - EDB     Ankle EDB 7.4 ?6.5 2.0 ?2.0 100 Ankle - EDB 9   7.1     Fib head EDB 14.5  1.3  66.1 Fib head -  Ankle 28 40 ?44 4.8     Pop fossa EDB 17.5  1.5  116 Pop fossa - Fib head 10 33 ?44 5.6         Pop fossa - Ankle      L Peroneal - EDB     Ankle EDB 6.5 ?6.5 1.9 ?2.0 100 Ankle - EDB 9   6.9     Fib head EDB 13.6  2.1  107 Fib head - Ankle 28 40 ?44 6.5     Pop fossa EDB 15.5  2.1  104 Pop fossa - Fib head 10 52 ?44 6.4         Pop fossa - Ankle      R Tibial - AH     Ankle AH 4.4 ?5.8 3.6 ?4.0 100 Ankle - AH 9   11.1     Pop fossa AH 17.7  0.5  15.3 Pop fossa - Ankle 35 26 ?41 1.9  L Tibial - AH     Ankle AH 4.7 ?5.8 3.5 ?4.0 100 Ankle - AH 9   14.4     Pop fossa AH 14.3  2.3  64.9  Pop fossa - Ankle 35 36 ?41 11.1                 SNC    Nerve / Sites Rec. Site Peak Lat Ref.  Amp Ref. Segments Distance Peak Diff Ref.    ms ms V V  cm ms ms  R Sural - Ankle (Calf)     Calf Ankle 3.7 ?4.4 2 ?6 Calf - Ankle 14    L Sural - Ankle (Calf)     Calf Ankle 3.4 ?4.4 3 ?6 Calf - Ankle 14    R Superficial peroneal - Ankle     Lat leg Ankle NR ?4.4 NR ?6 Lat leg - Ankle 14    L Superficial peroneal - Ankle     Lat leg Ankle NR ?4.4 NR ?6 Lat leg - Ankle 14    R Median, Ulnar - Transcarpal comparison     Median Palm Wrist 2.1 ?2.2 43 ?35 Median Palm - Wrist 8       Ulnar Palm Wrist 2.3 ?2.2 12 ?12 Ulnar Palm - Wrist 8          Median Palm - Ulnar Palm  -0.2 ?0.4  R Median - Orthodromic (Dig II, Mid palm)     Dig II Wrist 3.3 ?3.4 7 ?10 Dig II - Wrist 13    R Ulnar - Orthodromic, (Dig V, Mid palm)     Dig V Wrist 2.9 ?3.1 6 ?5 Dig V - Wrist 48                     F  Wave    Nerve F Lat Ref.   ms ms  R Tibial - AH 63.1 ?56.0  L Tibial - AH 58.4 ?56.0  R Ulnar - ADM 31.9 ?32.0           H Reflex    Nerve H Lat Lat Hmax   ms ms   Left Right Ref. Left Right Ref.  Tibial - Soleus 35.9 NR ?35.0 20.2 NR ?35.0         EMG full       EMG       EMG Summary Table    Spontaneous MUAP Recruitment  Muscle IA Fib PSW Fasc Other Amp Dur. Poly Pattern  R.  Vastus medialis Normal None None None _______ Normal Normal Normal Normal  Gastrocnemius (Medial head) Normal None None None _______ Normal Normal Normal Normal  Extensor hallucis longus Normal None None None _______ Normal Normal Normal Normal  Abductor hallucis Normal None None None _______ Normal Normal Normal Normal  Tibialis Anterior Normal  None None None _______ Normal Normal Normal Normal  Iliopsoas Normal None None None _______ Normal Normal Normal Normal

## 2016-07-08 NOTE — Patient Instructions (Signed)
Alpha-lipoic acid 400-600mg  a day B6 no more than 11mcg daily

## 2016-07-09 ENCOUNTER — Encounter (HOSPITAL_COMMUNITY): Payer: Self-pay

## 2016-07-09 ENCOUNTER — Ambulatory Visit: Payer: Medicare HMO | Admitting: Physical Therapy

## 2016-07-10 ENCOUNTER — Ambulatory Visit: Payer: Medicare HMO | Admitting: Physical Therapy

## 2016-07-14 ENCOUNTER — Ambulatory Visit: Payer: Medicare HMO | Admitting: Physical Therapy

## 2016-07-14 ENCOUNTER — Encounter (HOSPITAL_COMMUNITY)
Admission: RE | Admit: 2016-07-14 | Discharge: 2016-07-14 | Disposition: A | Discharge status: Home / Self Care | Payer: Self-pay | Source: Ambulatory Visit | Attending: Internal Medicine | Admitting: Internal Medicine

## 2016-07-14 LAB — PAN-ANCA
ANCA Proteinase 3: <3.5 U/mL (ref 0.0–3.5)
Atypical pANCA: <1:20 {titer}
Myeloperoxidase Ab: <9 U/mL (ref 0.0–9.0)

## 2016-07-14 LAB — HEPATITIS C ANTIBODY

## 2016-07-14 LAB — TISSUE TRANSGLUTAMINASE, IGA

## 2016-07-14 LAB — ALPHA GALACTOSIDASE: Alpha-Galactosidase activity: 30.2 nmol/hr/mg prt (ref 28.0–80.0)

## 2016-07-14 LAB — GLIADIN ANTIBODIES, SERUM
Antigliadin Abs, IgA: 5 units (ref 0–19)
GLIADIN IGG: 1 U (ref 0–19)

## 2016-07-14 LAB — SEDIMENTATION RATE: SED RATE: 2 mm/h (ref 0–40)

## 2016-07-15 NOTE — Procedures (Signed)
Full Name: Heather Smith Gender: Female MRN #: [id number] Date of Birth: 2032/06/21    Visit Date: 07/08/16 09:47 Age: 83 Years 2 Months Old Examining Physician: Sarina Ill, MD  Referring Physician: Shirline Frees, MD  History: Patient who is evaluated 06/01/2016 for numbness and tingling in the feet.Past medical history of hypertension, prediabetes, hyperlipidemia, hypothyroidism, osteoarthritis, stage III chronic kidney disease and congestive heart failure due to nonischemic cardiomyopathy. She is on vitamin B12 supplementation. Extensive lab panel including hemoglobin A1c, Lyme, vitamin B1, methylmalonic acid, angiotensin-converting enzyme, ANA with reflex, Sjogren's, B12 and folate, rheumatoid factor, heavy metals, multiple myeloma panel were unremarkable. Vitamin B6 was slightly elevated and she was asked not to supplement with too much B6 daily.  Summary:  The right median motor nerve showed decreased amplitude (1.8 mV, N>4). The right peroneal motor nerve showed delayed distal onset peak latency (7.4 ms, N<6.5), borderline amplitude (2 mV,N> 2), reduced conduction velocity (fibular head to ankle, 40 m/s, N>44) and reduced conduction velocity (popliteal fossa to the fibular head, 33 m/s, N>44). The left peroneal motor nerve showed delayed distal onset peak latency (6.5 ms, N<6.5), reduced amplitude (1.9 mV,N> 2), reduced conduction velocity (fibular head to ankle, 40 m/s, N>44).   The right tibial motor nerve showed decreased amplitude (3.6 mV, N>4).  The left tibial motor nerve showed decreased amplitude (3.5 mV, N>4).  The right sural sensory nerve showed reduced amplitude (2 V, N>6) however this may be normal in people aged greater than 36. The left sural sensory nerve showed reduced amplitude (3 V, N>6) however this may be normal in people aged greater than 61. The left and right superficial peroneal sensory nerves showed no response. The right median orthodromic sensory nerve showed  reduced amplitude (7 V, N>10). Right tibial F-wave showed delayed latency (63.1, N< 56). Left tibial F-wave showed delayed latency (58.4, N< 56). The right median/ulnar (palm) comparison nerve showed prolonged  distal peak latency (Ulnar Palm, 2.3 ms, N<2.2) and normal peak latency difference. The remaining nerves (as detailed in the following tables) were within normal limits. The muscles (as detailed in the following tables) were within normal limits.  Conclusion: There is evidence for an axonal, sensorimotor, length-dependent, symmetric polyneuropathy     Sarina Ill, M.D.  [facility] [place],  [address] Tel: [phone] Fax: [phone]        [facility]    Nerve / Sites Muscle Latency Ref. Amplitude Ref. Rel Amp Segments Distance Velocity Ref. Area    ms ms mV mV %  cm m/s m/s mVms  R Median - APB     Wrist APB 4.4 ?4.4 1.8 ?4.0 100 Wrist - APB 7   4.5     Upper arm APB 8.8  1.4  77.5 Upper arm - Wrist 24 56 ?49 3.8  R Ulnar - ADM     Wrist ADM 3.0 ?3.3 7.8 ?6.0 100 Wrist - ADM 7   22.8     B.Elbow ADM 6.9  7.0  89.5 B.Elbow - Wrist 21 54 ?49 21.4     A.Elbow ADM 9.1  6.8  97.5 A.Elbow - B.Elbow 12 54 ?49 21.5         A.Elbow - Wrist      R Peroneal - EDB     Ankle EDB 7.4 ?6.5 2.0 ?2.0 100 Ankle - EDB 9   7.1     Fib head EDB 14.5  1.3  66.1 Fib head -  Ankle 28 40 ?44 4.8     Pop fossa EDB 17.5  1.5  116 Pop fossa - Fib head 10 33 ?44 5.6         Pop fossa - Ankle      L Peroneal - EDB     Ankle EDB 6.5 ?6.5 1.9 ?2.0 100 Ankle - EDB 9   6.9     Fib head EDB 13.6  2.1  107 Fib head - Ankle 28 40 ?44 6.5     Pop fossa EDB 15.5  2.1  104 Pop fossa - Fib head 10 52 ?44 6.4         Pop fossa - Ankle      R Tibial - AH     Ankle AH 4.4 ?5.8 3.6 ?4.0 100 Ankle - AH 9   11.1     Pop fossa AH 17.7  0.5  15.3 Pop fossa - Ankle 35 26 ?41 1.9  L Tibial - AH     Ankle AH 4.7 ?5.8 3.5 ?4.0 100 Ankle - AH 9   14.4     Pop fossa AH 14.3  2.3  64.9  Pop fossa - Ankle 35 36 ?41 11.1                 SNC    Nerve / Sites Rec. Site Peak Lat Ref.  Amp Ref. Segments Distance Peak Diff Ref.    ms ms V V  cm ms ms  R Sural - Ankle (Calf)     Calf Ankle 3.7 ?4.4 2 ?6 Calf - Ankle 14    L Sural - Ankle (Calf)     Calf Ankle 3.4 ?4.4 3 ?6 Calf - Ankle 14    R Superficial peroneal - Ankle     Lat leg Ankle NR ?4.4 NR ?6 Lat leg - Ankle 14    L Superficial peroneal - Ankle     Lat leg Ankle NR ?4.4 NR ?6 Lat leg - Ankle 14    R Median, Ulnar - Transcarpal comparison     Median Palm Wrist 2.1 ?2.2 43 ?35 Median Palm - Wrist 8       Ulnar Palm Wrist 2.3 ?2.2 12 ?12 Ulnar Palm - Wrist 8          Median Palm - Ulnar Palm  -0.2 ?0.4  R Median - Orthodromic (Dig II, Mid palm)     Dig II Wrist 3.3 ?3.4 7 ?10 Dig II - Wrist 13    R Ulnar - Orthodromic, (Dig V, Mid palm)     Dig V Wrist 2.9 ?3.1 6 ?5 Dig V - Wrist 42                     F  Wave    Nerve F Lat Ref.   ms ms  R Tibial - AH 63.1 ?56.0  L Tibial - AH 58.4 ?56.0  R Ulnar - ADM 31.9 ?32.0           H Reflex    Nerve H Lat Lat Hmax   ms ms   Left Right Ref. Left Right Ref.  Tibial - Soleus 35.9 NR ?35.0 20.2 NR ?35.0         EMG full       EMG       EMG Summary Table    Spontaneous MUAP Recruitment  Muscle IA Fib PSW Fasc Other Amp Dur. Poly Pattern  R.  Vastus medialis Normal None None None _______ Normal Normal Normal Normal  Gastrocnemius (Medial head) Normal None None None _______ Normal Normal Normal Normal  Extensor hallucis longus Normal None None None _______ Normal Normal Normal Normal  Abductor hallucis Normal None None None _______ Normal Normal Normal Normal  Tibialis Anterior Normal  None None None _______ Normal Normal Normal Normal  Iliopsoas Normal None None None _______ Normal Normal Normal Normal

## 2016-07-16 ENCOUNTER — Encounter (HOSPITAL_COMMUNITY)
Admission: RE | Admit: 2016-07-16 | Discharge: 2016-07-16 | Disposition: A | Discharge status: Home / Self Care | Payer: Self-pay | Source: Ambulatory Visit | Attending: Internal Medicine | Admitting: Internal Medicine

## 2016-07-17 ENCOUNTER — Ambulatory Visit: Payer: Medicare HMO | Admitting: Physical Therapy

## 2016-07-21 ENCOUNTER — Ambulatory Visit: Payer: Medicare HMO | Admitting: Physical Therapy

## 2016-07-21 ENCOUNTER — Encounter (HOSPITAL_COMMUNITY): Payer: Self-pay

## 2016-07-23 ENCOUNTER — Encounter (HOSPITAL_COMMUNITY)
Admission: RE | Admit: 2016-07-23 | Discharge: 2016-07-23 | Disposition: A | Discharge status: Home / Self Care | Payer: Self-pay | Source: Ambulatory Visit | Attending: Internal Medicine | Admitting: Internal Medicine

## 2016-07-23 ENCOUNTER — Ambulatory Visit (INDEPENDENT_AMBULATORY_CARE_PROVIDER_SITE_OTHER): Payer: Medicare HMO | Admitting: *Deleted

## 2016-07-23 DIAGNOSIS — I428 Other cardiomyopathies: Secondary | ICD-10-CM

## 2016-07-23 DIAGNOSIS — I5022 Chronic systolic (congestive) heart failure: Secondary | ICD-10-CM

## 2016-07-23 NOTE — Progress Notes (Signed)
Remote ICD transmission.   

## 2016-07-24 ENCOUNTER — Encounter: Payer: Self-pay | Admitting: Cardiology

## 2016-07-24 ENCOUNTER — Ambulatory Visit: Payer: Medicare HMO | Admitting: Physical Therapy

## 2016-07-28 ENCOUNTER — Ambulatory Visit: Payer: Medicare HMO | Admitting: Physical Therapy

## 2016-07-28 ENCOUNTER — Encounter (HOSPITAL_COMMUNITY)
Admission: RE | Admit: 2016-07-28 | Discharge: 2016-07-28 | Disposition: A | Discharge status: Home / Self Care | Payer: Self-pay | Source: Ambulatory Visit | Attending: Internal Medicine | Admitting: Internal Medicine

## 2016-07-28 ENCOUNTER — Ambulatory Visit (HOSPITAL_COMMUNITY)
Admission: RE | Admit: 2016-07-28 | Discharge: 2016-07-28 | Disposition: A | Discharge status: Home / Self Care | Payer: Medicare HMO | Source: Ambulatory Visit | Attending: Internal Medicine | Admitting: Internal Medicine

## 2016-07-28 ENCOUNTER — Encounter (HOSPITAL_COMMUNITY): Payer: Self-pay

## 2016-07-28 VITALS — BP 140/74 | HR 54 | Wt 184.2 lb

## 2016-07-28 DIAGNOSIS — R001 Bradycardia, unspecified: Secondary | ICD-10-CM

## 2016-07-28 DIAGNOSIS — F32A Depression, unspecified: Secondary | ICD-10-CM

## 2016-07-28 DIAGNOSIS — E785 Hyperlipidemia, unspecified: Secondary | ICD-10-CM | POA: Diagnosis not present

## 2016-07-28 DIAGNOSIS — I429 Cardiomyopathy, unspecified: Secondary | ICD-10-CM | POA: Insufficient documentation

## 2016-07-28 DIAGNOSIS — G629 Polyneuropathy, unspecified: Secondary | ICD-10-CM | POA: Insufficient documentation

## 2016-07-28 DIAGNOSIS — M170 Bilateral primary osteoarthritis of knee: Secondary | ICD-10-CM | POA: Diagnosis not present

## 2016-07-28 DIAGNOSIS — R5383 Other fatigue: Secondary | ICD-10-CM | POA: Insufficient documentation

## 2016-07-28 DIAGNOSIS — I5032 Chronic diastolic (congestive) heart failure: Secondary | ICD-10-CM | POA: Diagnosis not present

## 2016-07-28 DIAGNOSIS — R69 Illness, unspecified: Secondary | ICD-10-CM | POA: Diagnosis not present

## 2016-07-28 DIAGNOSIS — Z96652 Presence of left artificial knee joint: Secondary | ICD-10-CM | POA: Insufficient documentation

## 2016-07-28 DIAGNOSIS — R918 Other nonspecific abnormal finding of lung field: Secondary | ICD-10-CM | POA: Diagnosis not present

## 2016-07-28 DIAGNOSIS — Z95810 Presence of automatic (implantable) cardiac defibrillator: Secondary | ICD-10-CM

## 2016-07-28 DIAGNOSIS — I493 Ventricular premature depolarization: Secondary | ICD-10-CM | POA: Diagnosis not present

## 2016-07-28 DIAGNOSIS — F329 Major depressive disorder, single episode, unspecified: Secondary | ICD-10-CM | POA: Diagnosis not present

## 2016-07-28 LAB — BASIC METABOLIC PANEL
ANION GAP: 9 (ref 5–15)
BUN: 22 mg/dL — ABNORMAL HIGH (ref 6–20)
CALCIUM: 9.5 mg/dL (ref 8.9–10.3)
CHLORIDE: 104 mmol/L (ref 101–111)
CO2: 25 mmol/L (ref 22–32)
CREATININE: 1.09 mg/dL — AB (ref 0.44–1.00)
GFR calc non Af Amer: 46 mL/min — ABNORMAL LOW (ref >60)
GFR, EST AFRICAN AMERICAN: 53 mL/min — AB (ref >60)
Glucose, Bld: 110 mg/dL — ABNORMAL HIGH (ref 65–99)
Potassium: 4.7 mmol/L (ref 3.5–5.1)
Sodium: 138 mmol/L (ref 135–145)

## 2016-07-28 LAB — CBC
HCT: 41.3 % (ref 36.0–46.0)
Hemoglobin: 13.8 g/dL (ref 12.0–15.0)
MCH: 30.9 pg (ref 26.0–34.0)
MCHC: 33.4 g/dL (ref 30.0–36.0)
MCV: 92.6 fL (ref 78.0–100.0)
PLATELETS: 154 10*3/uL (ref 150–400)
RBC: 4.46 MIL/uL (ref 3.87–5.11)
RDW: 13.5 % (ref 11.5–15.5)
WBC: 5.2 10*3/uL (ref 4.0–10.5)

## 2016-07-28 LAB — TSH: TSH: 1.525 u[IU]/mL (ref 0.350–4.500)

## 2016-07-28 LAB — BRAIN NATRIURETIC PEPTIDE: B Natriuretic Peptide: 93.6 pg/mL (ref 0.0–100.0)

## 2016-07-28 NOTE — Progress Notes (Signed)
Advanced Heart Failure Medication Review by a Pharmacist  Does the patient  feel that his/her medications are working for him/her?  yes  Has the patient been experiencing any side effects to the medications prescribed?  yes  Does the patient measure his/her own blood pressure or blood glucose at home?  yes   Does the patient have any problems obtaining medications due to transportation or finances?   no  Understanding of regimen: good Understanding of indications: good Potential of compliance: good Patient understands to avoid NSAIDs. Patient understands to avoid decongestants.  Issues to address at subsequent visits: low HR   Pharmacist comments: Heather Smith is a pleasant 83 y/o female who presents without medication bottles or list but good recollection of her medication regimen. Patient reports good adherence to her medication regimen. Patient reports feeling weak when walking, and she reports feeling unstable on her feet. She had no further medication related questions or concerns for me at this time.   Phillis Knack PharmD Candidate  Time with patient: 10 minutes Preparation and documentation time: 10 minutes Total time: 20 minutes

## 2016-07-28 NOTE — Progress Notes (Signed)
Patient ID: Heather Smith, female   DOB: 11-10-1932, 83 y.o.   MRN: [id number]     Advanced Heart Failure Clinic Note   PCP: Dr. Kenton Kingfisher HF Cardiology: Cannon Kettle is a 83 y.o. female with history of nonischemic cardiomyopathy and chronic systolic HF.  Patient has felt like her energy level has been low for several years.  In 9/14, she was admitted to [facility] with chest pain.  Cardiac cath was done, showing no significant coronary disease.  However, echo showed EF 30-35%.  She was started on meds for CHF, and echo was repeated in 12/14, EF was read as 20-25%.  She had a Medtonic CRT-D device placed (had baseline LBBB).  In 3/15, the LV lead was not capturing properly so device had to be reprogrammed.  She had AV optimization in 5/15.  Limited echo at that time showed improvement in EF to 40-45%.  CPX in 5/15 was submaximal but suggested low normal to mildly reduced functional capacity. Last echo in 6/16 showed EF 35-40% with diffuse hypokinesis.  CPX was done in 9/16.  This actually showed good functional capacity.   Echo 02/2016 LVEF 50-55%, Grade 1 DD, Mild MR.  She presents today for regular follow up. Last seen 11/2015. Still doing pulmonary rehab.  Was feeling better, but over the past month feeling bad again. Easily fatigued. Usually OK on flat ground. SOB with steps. Does feel like her balance is off at times. HR usually in 40-50s at home. Is wiped out the day after she goes to pulmonary rehab.  Denies CP or tachypalpitations. Denies orthopnea. Denies lightheadedness or dizziness.   Optivol: Fluid index with uptrend but no recent crossing, Pt active for 2-3 hrs today.   Labs (9/14): TSH normal Labs (3/15): K 4.7, creatinine 1.1 Labs (5/15): K 4.9, creatinine 1.1, BNP 44 Labs (6/15): K 5.1, creatinine 1.4, pro-BNP 39 Labs (6/15): K 5.3, creatinine 1.53 Labs (12/15): K 4.4, creatinine 1.03 Labs (5/16): K 4, creatinine 1.0, BNP 34.6 Labs (6/16): K 4.3, creatinine 1.1 Labs  (9/16): K 4.4, creatinine 1.16 Labs (11/16): K 4.5, creatinine 1.16 Labs (12/16): K 4.6, creatinine 1.16, BNP 22, Mg 1.9 Labs (1/17): K 4.3, creatinine 1.34, HCT 42.4, ESR normal Labs (6/17): K 4.6, creatinine 1.23, hgb 9.9, TSH normal  PMH: 1. Chronic systolic CHF: Nonischemic cardiomyopathy.  LHC (9/14) with no significant coronary disease, EF 25-30% with global hypokinesis.  Echo (9/14) with EF 30-35%, moderate LV dilation, diffuse hypokinesis.  Echo (12/14) with EF 20-25%.  She does not drink ETOH.  She had Medtronic CRT-D device implantation in 1/15. Echo (5/15) with EF 40-45%, mild LVH.  CPX (5/15) was submaximal with RER 0.99, peak VO2 14, VE/VCO2 37.3; low normal functional capacity may be limited by loss of BiV pacing at higher HR and by deconditioning but interpretation somewhat limited since study was submaximal.  Echo (6/16) with EF 35-40%, severe LV dilation, diffuse hypokinesis, mild MR.  CPX (9/16) with peak VO2 17.7, VE/VCO2 slope 32.3, RER 1.11 => excellent functional capacity. Echo (11/17) with EF 60-65%.  2. Back pain s/p lumbar laminectomy 3. OA: Knees. Left TKR in 6/17.  4. Hyperlipidemia 5. Depression 6. PVCs 7. Peripheral neuropathy 8. Lung nodule  SH: Lives [place], married, never smoked, no ETOH.    FH: Mother with MI at 82, possible ischemic cardiomyopathy.  Father with cardiomyopathy, died at 71.  She has 5 brothers, none with significant coronary disease.    Review of systems  complete and found to be negative unless listed in HPI.    Current Outpatient Prescriptions  Medication Sig Dispense Refill  . acetaminophen (TYLENOL) 650 MG CR tablet Take 650 mg by mouth every 8 (eight) hours as needed for pain.    . Ascorbic Acid (VITAMIN C) 1000 MG tablet Take 1,000 mg by mouth daily.     . B Complex Vitamins (VITAMIN B COMPLEX PO) Take 1 tablet by mouth daily. Reported on 01/30/2015    . carvedilol (COREG) 6.25 MG tablet TAKE ONE TABLET BY MOUTH TWICE DAILY 180  tablet 3  . Coenzyme Q10 (COQ10) 100 MG CAPS Take 1 capsule by mouth daily as needed.     . DULoxetine (CYMBALTA) 30 MG capsule     . ENTRESTO 24-26 MG TAKE ONE TABLET BY MOUTH TWICE DAILY 180 tablet 1  . gabapentin (NEURONTIN) 100 MG capsule Take 2 capsules (200 mg total) by mouth at bedtime. 60 capsule 11  . levothyroxine (SYNTHROID, LEVOTHROID) 50 MCG tablet Take 50 mcg by mouth daily.      . Magnesium Oxide (MAG-200 PO) Take 1 tablet by mouth 2 (two) times daily.     . multivitamin (THERAGRAN) per tablet Take 1 tablet by mouth daily.      Marland Kitchen spironolactone (ALDACTONE) 25 MG tablet Take 1 tablet (25 mg total) by mouth daily. 90 tablet 3  . vitamin E 400 UNIT capsule Take 400 Units by mouth daily.     No current facility-administered medications for this encounter.    Vitals:   07/28/16 1132  BP: 140/74  Pulse: (!) 54  SpO2: 96%  Weight: 184 lb 3.2 oz (83.6 kg)   General: Elderly appearing. No resp difficulty. HEENT: Normal Neck: Supple. JVP 6-7. Carotids 2+ bilat; no bruits. No thyromegaly or nodule noted. Cor: PMI nondisplaced. RRR, 1/6 HSM at apex. Lungs: CTAB, normal effort. Abdomen: Soft, non-tender, non-distended, no HSM. No bruits or masses. +BS  Extremities: No cyanosis, clubbing, rash, R and LLE no edema.  Neuro: Alert & orientedx3, cranial nerves grossly intact. moves all 4 extremities w/o difficulty. Affect pleasant   EKG: Personally reviewed, AV dual paced with PVCs. 74 bpm. QRS 144  Assessment/Plan:  1. Chronic diastolic CHF: Nonischemic cardiomyopathy s/p Medtronic CRT-D. EF 35-40% (6/16). She does have a family history of cardiomyopathy of uncertain etiology (father) but none of her siblings developed a cardiomyopathy.  Familial cardiomyopathy is a consideration.  Viral myocarditis is also a possible etiology.  Echo in 1/17 showed improvement in EF to 60-65%. Symptoms have been out of proportion to objective findings.  Her CPX previously showed excellent functional  capacity. Will repeat with worsening symptoms.  - Volume status looks OK on exam, despite fluid level trending gently up on Optivol. ReDS vest 25%. - Continue Entresto 49/51 mg BID. Pressures in 90-100s at home. No room to uptitrate.  - Continue coreg 6.25 mg BID - Continue Spiro 25 mg daily - Continue pulmonary rehab.  - Continue current Entresto, Coreg, and spironolactone.  BMET today.  - Will arrange for CPX to look at functional capacity, though EF in January normal. - Pacing 50-60% of the time, QRS 144 ms. Will have EP see and adjust settings if needed. She states her HRs are dropping into the 40s at home.  2. Depression:  - Stable on Celexa.   3. PVCs:  - Not feeling very often.   4. Lung nodule:  - Stable nodules on CT 01/2016.   5. Fatigue - Think combination  of deconditioning and depression. - Will also check CBC and TSH (on thyroid replacement.)  Labs today. No room to up-titrate meds.  Will have see EP for CRT-D optimization. Repeat CPX for functional status. RTC 2 months.  Shirley Friar, PA-C  07/28/2016   Greater than 50% of the 25 minute visit was spent in counseling/coordination of care regarding disease state education, medication reconciliation, referral, and salt/fluid restriction.

## 2016-07-28 NOTE — Patient Instructions (Addendum)
Routine lab work today. Will notify you of abnormal results, otherwise no news is good news!  No changes to medication at this time.  Will schedule you for Cardiopulmonary Exercise Test. This test is done at our [facility]. Please wear comfortable clothes and shoes for this test. Avoid heavy meal before the test (light snack/meal recommended). Avoid caffeine, alcohol, tobacco products 12 hrs before test. Please give 24 hr notice for cancellations/rescheduling: [phone].  Will refer you to electrophysiology at [facility] with Dr. Caryl Comes to evaluate your device with low heart rate. Address: [address] ([name]), [place], [place] [address]  Phone: [phone]  Follow up 2 months with Dr. Aundra Dubin. Take all medication as prescribed the day of your appointment. Bring all medications with you to your appointment.  Do the following things EVERYDAY: 1) Weigh yourself in the morning before breakfast. Write it down and keep it in a log. 2) Take your medicines as prescribed 3) Eat low salt foods-Limit salt (sodium) to 2000 mg per day.  4) Stay as active as you can everyday 5) Limit all fluids for the day to less than 2 liters

## 2016-07-29 LAB — CUP PACEART REMOTE DEVICE CHECK
Battery Remaining Longevity: 20 mo
Battery Voltage: 2.93 V
Brady Statistic RA Percent Paced: 51.75 %
Date Time Interrogation Session: 20180621073524
HIGH POWER IMPEDANCE MEASURED VALUE: 67 Ohm
Implantable Lead Implant Date: 20150121
Implantable Lead Implant Date: 20150121
Implantable Lead Location: 753858
Implantable Pulse Generator Implant Date: 20150121
Lead Channel Impedance Value: 437 Ohm
Lead Channel Impedance Value: 437 Ohm
Lead Channel Impedance Value: 456 Ohm
Lead Channel Impedance Value: 456 Ohm
Lead Channel Impedance Value: 513 Ohm
Lead Channel Impedance Value: 570 Ohm
Lead Channel Pacing Threshold Pulse Width: 0.4 ms
Lead Channel Pacing Threshold Pulse Width: 0.4 ms
Lead Channel Pacing Threshold Pulse Width: 0.7 ms
Lead Channel Sensing Intrinsic Amplitude: 13 mV
Lead Channel Setting Pacing Amplitude: 1.5 V
Lead Channel Setting Pacing Amplitude: 2 V
Lead Channel Setting Pacing Pulse Width: 0.7 ms
MDC IDC LEAD IMPLANT DT: 20150121
MDC IDC LEAD LOCATION: 753859
MDC IDC LEAD LOCATION: 753860
MDC IDC MSMT LEADCHNL LV IMPEDANCE VALUE: 247 Ohm
MDC IDC MSMT LEADCHNL LV IMPEDANCE VALUE: 323 Ohm
MDC IDC MSMT LEADCHNL LV IMPEDANCE VALUE: 342 Ohm
MDC IDC MSMT LEADCHNL LV IMPEDANCE VALUE: 399 Ohm
MDC IDC MSMT LEADCHNL LV IMPEDANCE VALUE: 627 Ohm
MDC IDC MSMT LEADCHNL LV IMPEDANCE VALUE: 665 Ohm
MDC IDC MSMT LEADCHNL LV PACING THRESHOLD AMPLITUDE: 1.125 V
MDC IDC MSMT LEADCHNL RA PACING THRESHOLD AMPLITUDE: 0.625 V
MDC IDC MSMT LEADCHNL RA SENSING INTR AMPL: 2.75 mV
MDC IDC MSMT LEADCHNL RA SENSING INTR AMPL: 2.75 mV
MDC IDC MSMT LEADCHNL RV IMPEDANCE VALUE: 589 Ohm
MDC IDC MSMT LEADCHNL RV PACING THRESHOLD AMPLITUDE: 0.5 V
MDC IDC MSMT LEADCHNL RV SENSING INTR AMPL: 13 mV
MDC IDC SET LEADCHNL LV PACING AMPLITUDE: 2.25 V
MDC IDC SET LEADCHNL RV PACING PULSEWIDTH: 0.4 ms
MDC IDC SET LEADCHNL RV SENSING SENSITIVITY: 0.3 mV
MDC IDC STAT BRADY AP VP PERCENT: 59.51 %
MDC IDC STAT BRADY AP VS PERCENT: 1.09 %
MDC IDC STAT BRADY AS VP PERCENT: 38.06 %
MDC IDC STAT BRADY AS VS PERCENT: 1.33 %
MDC IDC STAT BRADY RV PERCENT PACED: 82.12 %

## 2016-07-30 ENCOUNTER — Encounter (HOSPITAL_COMMUNITY): Payer: Self-pay

## 2016-07-31 ENCOUNTER — Ambulatory Visit: Payer: Medicare HMO | Admitting: Physical Therapy

## 2016-08-04 ENCOUNTER — Encounter (HOSPITAL_COMMUNITY)
Admission: RE | Admit: 2016-08-04 | Discharge: 2016-08-04 | Disposition: A | Discharge status: Home / Self Care | Payer: Medicare HMO | Source: Ambulatory Visit | Attending: Internal Medicine | Admitting: Internal Medicine

## 2016-08-04 DIAGNOSIS — I509 Heart failure, unspecified: Secondary | ICD-10-CM | POA: Insufficient documentation

## 2016-08-06 ENCOUNTER — Ambulatory Visit (INDEPENDENT_AMBULATORY_CARE_PROVIDER_SITE_OTHER): Payer: Medicare HMO | Admitting: Internal Medicine

## 2016-08-06 ENCOUNTER — Encounter (HOSPITAL_COMMUNITY): Payer: Medicare HMO

## 2016-08-06 ENCOUNTER — Encounter: Payer: Self-pay | Admitting: Internal Medicine

## 2016-08-06 VITALS — BP 100/62 | HR 54 | Ht 67.5 in | Wt 182.8 lb

## 2016-08-06 DIAGNOSIS — I480 Paroxysmal atrial fibrillation: Secondary | ICD-10-CM | POA: Diagnosis not present

## 2016-08-06 DIAGNOSIS — I5032 Chronic diastolic (congestive) heart failure: Secondary | ICD-10-CM | POA: Diagnosis not present

## 2016-08-06 DIAGNOSIS — R001 Bradycardia, unspecified: Secondary | ICD-10-CM

## 2016-08-06 DIAGNOSIS — Z95810 Presence of automatic (implantable) cardiac defibrillator: Secondary | ICD-10-CM

## 2016-08-06 DIAGNOSIS — I493 Ventricular premature depolarization: Secondary | ICD-10-CM | POA: Diagnosis not present

## 2016-08-06 DIAGNOSIS — I428 Other cardiomyopathies: Secondary | ICD-10-CM

## 2016-08-06 DIAGNOSIS — I447 Left bundle-branch block, unspecified: Secondary | ICD-10-CM

## 2016-08-06 NOTE — Patient Instructions (Signed)
Medication Instructions: - Your physician recommends that you continue on your current medications as directed. Please refer to the Current Medication list given to you today.  Labwork: - none ordered  Procedures/Testing: - none ordered  Follow-Up: - Remote monitoring is used to monitor your Pacemaker of ICD from home. This monitoring reduces the number of office visits required to check your device to one time per year. It allows [place] to keep an eye on the functioning of your device to ensure it is working properly. You are scheduled for a device check from home on 08/20/16. You may send your transmission at any time that day. If you have a wireless device, the transmission will be sent automatically. After your physician reviews your transmission, you will receive a postcard with your next transmission date.  -Your physician recommends that you follow-up: as scheduled with Dr. Caryl Comes.  Any Additional Special Instructions Will Be Listed Below (If Applicable).     If you need a refill on your cardiac medications before your next appointment, please call your pharmacy.

## 2016-08-06 NOTE — Progress Notes (Signed)
/      Patient Care Team: Shirline Frees, MD as PCP - General   HPI  Heather Smith is a 83 y.o. female Seen in followup for CRT implantation for nonischemic cardiomyopathy and depressed LV function. This was accomplished fall 2014  Interval partial lead dislodgment noted after she saw Dr. Mariana Single in March and an x-ray was obtained and reviewed. This  x-ray was reviewed. We reprogrammed the device    I reviewed with her the data from her CPX 9/16 showed excellent functional capacity and her echo from last week demonstrated normal LV function  DATE TEST    12/14 Echo EF 20-25%   1/17    Echo   EF 55-62 %   1/18    Echo   EF 55-60 %        She had undergone cardiopulmonary rehabilitation with some interval improvement. More recently she been feeling weak and washed out again.     Past Medical History:  Diagnosis Date  . Automatic implantable cardioverter-defibrillator in situ   . Biventricular defibrillator  Medtronic 05/30/2013  . Chest pain    a. Nl cath in the 90's;  b. 04/2005: Myoview: subtle areao of ? reversibility in apical segment of anteromedial LV - ? attenuation, EF 54%;  c. 05/2012 MV: small, fixed mild apical septal perfusion defect, ? attenuation due to lbbb, no ischemia.  . CHF (congestive heart failure) ([place])   . Chronic systolic heart failure ([place])   . Complication of anesthesia    difficult to wake up and blood pressure drops  . Family history of adverse reaction to anesthesia    difficulty to wake up (daughter)  . GERD (gastroesophageal reflux disease)   . Hypercholesterolemia   . Hypertension   . Hypothyroidism   . Left bundle branch block   . LV lead partial dislodgment 05/30/2013  . Neurogenic claudication due to lumbar spinal stenosis   . Non-ischemic cardiomyopathy ([place])    [place] 10/04/12: Normal coronary arteries, EF 25-30% with global HK.  Echocardiogram 10/05/12: EF 30-35%, diffuse HK, moderate LAE. Echocardiogram (01/2013): Lateral to septal wall  dyssynchrony EF 20-25%, diffuse HK  . Osteoarthritis    "about all over" (02/22/2013)  . Shortness of breath dyspnea    occasional b/c of CHF  . Spinal stenosis   . Vitamin D deficiency     Past Surgical History:  Procedure Laterality Date  . APPENDECTOMY    . BI-VENTRICULAR IMPLANTABLE CARDIOVERTER DEFIBRILLATOR N/A 02/22/2013   Procedure: BI-VENTRICULAR IMPLANTABLE CARDIOVERTER DEFIBRILLATOR  (CRT-D);  Surgeon: Deboraha Sprang, MD;  Location: [facility] CATH LAB;  Service: Cardiovascular;  Laterality: N/A;  . BLEPHAROPLASTY Bilateral   . CARDIAC CATHETERIZATION  2000  . CARDIAC DEFIBRILLATOR PLACEMENT  02/22/2013   with LV lead placement and HV lead testing/notes 02/22/2013  . EXCISIONAL HEMORRHOIDECTOMY    . FOOT ARTHRODESIS, MODIFIED [place] Bilateral   . KNEE ARTHROSCOPY Left   . KNEE ARTHROSCOPY Right   . LEFT HEART CATHETERIZATION WITH CORONARY ANGIOGRAM N/A 10/04/2012   Procedure: LEFT HEART CATHETERIZATION WITH CORONARY ANGIOGRAM;  Surgeon: Peter M Martinique, MD;  Location: [facility] CATH LAB;  Service: Cardiovascular;  Laterality: N/A;  . LUMBAR LAMINECTOMY  11/03/2006    Bilateral 3, 4, 5 laminectomy, partial L2, decompression of the thecal sac, foraminotomy, posterolateral arthrodesis L3-S1 with autograft   -- SURGEON:  Leeroy Cha, M.D.   . TONSILLECTOMY    . TOTAL KNEE ARTHROPLASTY Left 07/15/2015   Procedure: LEFT TOTAL KNEE ARTHROPLASTY;  Surgeon:  Frederik Pear, MD;  Location: [place];  Service: Orthopedics;  Laterality: Left;  . TUBAL LIGATION    . VAGINAL HYSTERECTOMY      Current Outpatient Prescriptions  Medication Sig Dispense Refill  . acetaminophen (TYLENOL) 650 MG CR tablet Take 650 mg by mouth every 8 (eight) hours as needed for pain.    . Ascorbic Acid (VITAMIN C) 1000 MG tablet Take 1,000 mg by mouth daily.     . B Complex Vitamins (VITAMIN B COMPLEX PO) Take 1 tablet by mouth daily. Reported on 01/30/2015    . carvedilol (COREG) 6.25 MG tablet TAKE ONE TABLET BY MOUTH TWICE  DAILY 180 tablet 3  . Coenzyme Q10 (COQ10) 100 MG CAPS Take 1 capsule by mouth daily as needed.     . DULoxetine (CYMBALTA) 30 MG capsule     . ENTRESTO 24-26 MG TAKE ONE TABLET BY MOUTH TWICE DAILY 180 tablet 1  . gabapentin (NEURONTIN) 100 MG capsule Take 2 capsules (200 mg total) by mouth at bedtime. 60 capsule 11  . levothyroxine (SYNTHROID, LEVOTHROID) 50 MCG tablet Take 50 mcg by mouth daily.      . Magnesium Oxide (MAG-200 PO) Take 1 tablet by mouth 2 (two) times daily.     . multivitamin (THERAGRAN) per tablet Take 1 tablet by mouth daily.      Marland Kitchen spironolactone (ALDACTONE) 25 MG tablet Take 1 tablet (25 mg total) by mouth daily. 90 tablet 3  . vitamin E 400 UNIT capsule Take 400 Units by mouth daily.     No current facility-administered medications for this visit.     Allergies  Allergen Reactions  . Statins Other (See Comments)    "Makes her bones hurt and very sore"  . Niaspan [Niacin Er]     High doses, causes hot flashes & aching   . Benadryl [Diphenhydramine] Other (See Comments)    Makes pt feel jittery   . Dilaudid [Hydromorphone Hcl] Itching    Review of Systems negative except from HPI and PMH  Physical Exam BP 100/62 (BP Location: Left Arm, Patient Position: Sitting, Cuff Size: Normal)   Pulse (!) 54   Ht 5' 7.5" (1.715 m)   Wt 182 lb 12 oz (82.9 kg)   BMI 28.20 kg/m  Well developed and nourished in no acute distress HENT normal Neck supple with JVP-flat Carotids brisk and full without bruits Clear Regular rate and rhythm, no murmurs or gallops Abd-soft with active BS without hepatomegaly No Clubbing cyanosis edema Skin-warm and dry A & Oriented  Grossly normal sensory and motor function Affect flat  ECG demonstrates  AV pacing with  positive QRS in lead V1 and lead 1 with a QRS duration of about 120 ms frequent PVCs with a left bundle superior axis morphology     Assessment and  Plan  Nonischemic cardiomyopathy-- interval  normalization  Congestive heart failure-chronic-diastolic Euvolemic continue current meds  Medtronic ICD-CRT The patient's device was interrogated.  The information was reviewed. No changes were made in the programming.     Atrial fibrillation-paroxysmal  LV threshold change  Depression   PVCs  Hypotension   there has been interval increase in her PVC count. I reprogrammed the device to DDD-45 and very surprisingly her PVC burden dramatically diminished. I thought we could repeat a PVC count and a couple of weeks. We will try and correlate PVC count with her symptoms. I would have a low threshold to undertake antiarrhythmic therapy probably using flecainide to try to  further suppress PVCs if her PVC count exceeds 5% on recheck.  We spent more than 50% of our >25 min visit in face to face counseling regarding the above

## 2016-08-07 ENCOUNTER — Encounter: Payer: Self-pay | Admitting: Cardiology

## 2016-08-07 NOTE — Progress Notes (Signed)
Letter  

## 2016-08-11 ENCOUNTER — Encounter (HOSPITAL_COMMUNITY)
Admission: RE | Admit: 2016-08-11 | Discharge: 2016-08-11 | Disposition: A | Discharge status: Home / Self Care | Payer: Medicare HMO | Source: Ambulatory Visit | Attending: Internal Medicine | Admitting: Internal Medicine

## 2016-08-13 ENCOUNTER — Ambulatory Visit (HOSPITAL_COMMUNITY): Payer: Medicare HMO | Attending: Internal Medicine

## 2016-08-13 ENCOUNTER — Encounter (HOSPITAL_COMMUNITY): Payer: Medicare HMO

## 2016-08-13 ENCOUNTER — Encounter (HOSPITAL_COMMUNITY): Payer: Self-pay | Admitting: *Deleted

## 2016-08-13 NOTE — Progress Notes (Signed)
Patient arrived for CPX with little to no complaint or symptoms. Resting ECG prior to exercise revealed frequent PVCs with no regular rhythm which inhibited the exercise with gas exchange to begin. Regular rhythm with less PVC frequency was required to proceed with exercise in [place].   Will discuss with Dr. Caryl Comes and Dr. Aundra Dubin regarding irregular rhythm. At this time, patient was agreeable with plan to postpone CPX and will follow-up with Dr. Aundra Dubin and Dr. Caryl Comes as previously scheduled.    Heather Martins, MS, ACSM-RCEP 08/13/2016 4:16 PM

## 2016-08-17 ENCOUNTER — Telehealth: Payer: Self-pay | Admitting: Internal Medicine

## 2016-08-17 NOTE — Telephone Encounter (Signed)
PT WAS UNABLE  TO HAVE CPX DUE TO  FREQUENT  PVC'S WILL  LET DR Caryl Comes  KNOW  AND  WHAT NEEDS TO BE  DONE AT THIS POINT ALSO  SEE NOTE  FROM  [facility] ON 08-13-16

## 2016-08-17 NOTE — Telephone Encounter (Signed)
New message      Talk to the nurse.  Pt was to have a stress test last week at the heart failure clinic but was having PVC's and could not get it done.  Please call

## 2016-08-18 ENCOUNTER — Encounter (HOSPITAL_COMMUNITY): Payer: Medicare HMO

## 2016-08-18 MED ORDER — FLECAINIDE ACETATE 100 MG PO TABS: 100.0000 mg | ORAL_TABLET | Freq: Two times a day (BID) | ORAL | 6 refills | Status: DC

## 2016-08-18 NOTE — Telephone Encounter (Signed)
H lets try her on flec 100 bid.  Her husband was on propafenone and she may want to try that , 225 bid Thanks

## 2016-08-18 NOTE — Telephone Encounter (Signed)
I spoke with the patient- advised her of Dr. Olin Pia recommendations.  She states she cannot really feel her PVC's, but does feel fatigued. She is agreeable with starting medical therapy for this. I advised I will send in flecainide 100 mg BID and discuss follow up after that with Dr. Caryl Comes.

## 2016-08-18 NOTE — Telephone Encounter (Signed)
CPX was scheduled by CHF clinic. Will forward to Dr. Caryl Comes to review regarding PVC's.

## 2016-08-20 ENCOUNTER — Ambulatory Visit (INDEPENDENT_AMBULATORY_CARE_PROVIDER_SITE_OTHER): Payer: Self-pay | Admitting: *Deleted

## 2016-08-20 ENCOUNTER — Encounter (HOSPITAL_COMMUNITY)
Admission: RE | Admit: 2016-08-20 | Discharge: 2016-08-20 | Disposition: A | Discharge status: Home / Self Care | Payer: Medicare HMO | Source: Ambulatory Visit | Attending: Internal Medicine | Admitting: Internal Medicine

## 2016-08-20 DIAGNOSIS — Z95810 Presence of automatic (implantable) cardiac defibrillator: Secondary | ICD-10-CM

## 2016-08-20 NOTE — Telephone Encounter (Signed)
Per Dr. Caryl Comes- have the patient send a transmission next week to follow up on PVC burden.  I have notified the patient of this and asked that she send a transmission - mid to end of next week. She is agreeable. Will forward to [facility] to look be looking for this next week and let me know what the findings are so I can review with Dr. Caryl Comes.

## 2016-08-20 NOTE — Progress Notes (Signed)
Remote ICD transmission.   

## 2016-08-21 ENCOUNTER — Other Ambulatory Visit: Payer: Self-pay | Admitting: Internal Medicine

## 2016-08-21 LAB — CUP PACEART REMOTE DEVICE CHECK
Battery Remaining Longevity: 19 mo
Battery Voltage: 2.93 V
Brady Statistic AP VP Percent: 2.68 %
Brady Statistic AP VS Percent: 0.06 %
Brady Statistic AS VP Percent: 95.38 %
Brady Statistic AS VS Percent: 1.88 %
Brady Statistic RA Percent Paced: 2.7 %
Brady Statistic RV Percent Paced: 91.57 %
Date Time Interrogation Session: 20180719062822
HighPow Impedance: 67 Ohm
Implantable Lead Implant Date: 20150121
Implantable Lead Implant Date: 20150121
Implantable Lead Implant Date: 20150121
Implantable Lead Location: 753858
Implantable Lead Location: 753859
Implantable Lead Location: 753860
Implantable Lead Model: 4298
Implantable Lead Model: 5076
Implantable Pulse Generator Implant Date: 20150121
Lead Channel Impedance Value: 304 Ohm
Lead Channel Impedance Value: 380 Ohm
Lead Channel Impedance Value: 399 Ohm
Lead Channel Impedance Value: 399 Ohm
Lead Channel Impedance Value: 456 Ohm
Lead Channel Impedance Value: 456 Ohm
Lead Channel Impedance Value: 494 Ohm
Lead Channel Impedance Value: 513 Ohm
Lead Channel Impedance Value: 513 Ohm
Lead Channel Impedance Value: 570 Ohm
Lead Channel Impedance Value: 589 Ohm
Lead Channel Impedance Value: 646 Ohm
Lead Channel Impedance Value: 646 Ohm
Lead Channel Pacing Threshold Amplitude: 0.625 V
Lead Channel Pacing Threshold Amplitude: 0.75 V
Lead Channel Pacing Threshold Amplitude: 1.625 V
Lead Channel Pacing Threshold Pulse Width: 0.4 ms
Lead Channel Pacing Threshold Pulse Width: 0.4 ms
Lead Channel Pacing Threshold Pulse Width: 0.7 ms
Lead Channel Sensing Intrinsic Amplitude: 13.125 mV
Lead Channel Sensing Intrinsic Amplitude: 13.125 mV
Lead Channel Sensing Intrinsic Amplitude: 2.125 mV
Lead Channel Sensing Intrinsic Amplitude: 2.125 mV
Lead Channel Setting Pacing Amplitude: 1.5 V
Lead Channel Setting Pacing Amplitude: 2 V
Lead Channel Setting Pacing Amplitude: 2.75 V
Lead Channel Setting Pacing Pulse Width: 0.4 ms
Lead Channel Setting Pacing Pulse Width: 0.7 ms
Lead Channel Setting Sensing Sensitivity: 0.3 mV

## 2016-08-24 ENCOUNTER — Encounter: Payer: Self-pay | Admitting: Physical Therapy

## 2016-08-24 NOTE — Therapy (Unsigned)
[facility] [address] [place], [place], [address] Phone: [phone]   Fax:  [phone]  Patient Details  Name: Heather Smith MRN: [id number] Date of Birth: 04/06/1932  Encounter Date: 08/24/2016  PHYSICAL THERAPY DISCHARGE SUMMARY  Visits from Start of Care: 2  Current functional level related to goals / functional outcomes: Unable to assess due to did not return to clinic   Remaining deficits: Unable to assess due to did not return to clinic   Education / Equipment: HEP  Plan: Patient agrees to discharge.  Patient goals were not met. Patient is being discharged due to financial reasons.  ?????    Patient called and cancelled all remaining appointments stating she could not afford the co-payments at this time.              Lynn P Sasser, PT 08/24/2016, 8:09 AM  [facility] [address] [place], [place], [address] Phone: [phone]   Fax:  [phone] 

## 2016-08-25 ENCOUNTER — Encounter (HOSPITAL_COMMUNITY)
Admission: RE | Admit: 2016-08-25 | Discharge: 2016-08-25 | Disposition: A | Discharge status: Home / Self Care | Payer: Medicare HMO | Source: Ambulatory Visit | Attending: Internal Medicine | Admitting: Internal Medicine

## 2016-08-27 ENCOUNTER — Encounter (HOSPITAL_COMMUNITY): Payer: Medicare HMO

## 2016-08-27 ENCOUNTER — Encounter: Payer: Self-pay | Admitting: Cardiology

## 2016-08-28 NOTE — Telephone Encounter (Signed)
Spoke with patient and requested manual transmission from her home monitor for review for PVC burden.  Most recent transmission was on 08/20/16.  Patient agrees to send transmission this weekend for review by Dr. Caryl Comes next week.

## 2016-08-31 NOTE — Telephone Encounter (Signed)
Transmission received 08/28/16.  Number of PVC runs decreased from 6.1/hr (7/5-7/19) to 0.1/hr (7/19-7/27).  Number of PVC singles decreased from 332.6/hr (7/5-7/19) to 1.5/hr (7/19-7/27).

## 2016-09-01 ENCOUNTER — Encounter (HOSPITAL_COMMUNITY): Payer: Medicare HMO

## 2016-09-01 ENCOUNTER — Other Ambulatory Visit: Payer: Self-pay

## 2016-09-01 ENCOUNTER — Ambulatory Visit: Payer: Medicare HMO | Admitting: Internal Medicine

## 2016-09-01 NOTE — Telephone Encounter (Signed)
I called and spoke with the patient.  She is aware of the findings on her transmission regarding PVC's. Per the patient, she still has fatigue that is the about the same as prior to her flecainide initiation.  She feels she is having a slight change in her vision and is unsure if related to the medication. Reviewed micromeddx and blurred vision is a potential side effect.  She is scheduled to see her eye doctor on 09/17/16. I advised I will make Dr. Caryl Comes aware.  Per Dr. Caryl Comes, ok to wait until the patient is seen by her eye doctor prior to stopping/ changing flecainide.

## 2016-09-01 NOTE — Telephone Encounter (Signed)
Thanks this is great news  !!! How does she feel

## 2016-09-03 ENCOUNTER — Encounter (HOSPITAL_COMMUNITY): Payer: Medicare HMO

## 2016-09-03 DIAGNOSIS — I509 Heart failure, unspecified: Secondary | ICD-10-CM | POA: Insufficient documentation

## 2016-09-08 ENCOUNTER — Telehealth: Payer: Self-pay | Admitting: Cardiology

## 2016-09-08 ENCOUNTER — Encounter (HOSPITAL_COMMUNITY)
Admission: RE | Admit: 2016-09-08 | Discharge: 2016-09-08 | Disposition: A | Discharge status: Home / Self Care | Payer: Medicare HMO | Source: Ambulatory Visit | Attending: Internal Medicine | Admitting: Internal Medicine

## 2016-09-08 ENCOUNTER — Ambulatory Visit (INDEPENDENT_AMBULATORY_CARE_PROVIDER_SITE_OTHER): Payer: Self-pay | Admitting: *Deleted

## 2016-09-08 DIAGNOSIS — I428 Other cardiomyopathies: Secondary | ICD-10-CM

## 2016-09-08 NOTE — Progress Notes (Signed)
Remote ICD transmission.   

## 2016-09-08 NOTE — Telephone Encounter (Signed)
LMOVM reminding pt to send remote transmission.   

## 2016-09-09 ENCOUNTER — Encounter: Payer: Self-pay | Admitting: Cardiology

## 2016-09-10 ENCOUNTER — Encounter (HOSPITAL_COMMUNITY): Payer: Medicare HMO

## 2016-09-11 ENCOUNTER — Encounter: Payer: Self-pay | Admitting: Internal Medicine

## 2016-09-11 NOTE — Progress Notes (Signed)
Received a message regarding drug interaction between flecainide and duloxetine. According to [employer] it's his major interaction resulting in decreased metabolism of flecainide. The concern is increased cardiac effect. This should be manifested in QRS prolongation as a sodium channel blockade. We will obtain a follow-up electrocardiogram medication.

## 2016-09-14 ENCOUNTER — Telehealth: Payer: Self-pay | Admitting: Internal Medicine

## 2016-09-14 ENCOUNTER — Other Ambulatory Visit: Payer: Self-pay

## 2016-09-14 DIAGNOSIS — T887XXA Unspecified adverse effect of drug or medicament, initial encounter: Secondary | ICD-10-CM

## 2016-09-14 NOTE — Telephone Encounter (Signed)
Scheduled pt for echocardiogram, per Dr. Caryl Comes to f/u on Flecainide. Made pt aware there would be multiple charges being performed in our [place] location. I offered for her to come to our [place] office to have this test, but pt declined due to driving distance. Pt agreed to have test performed at [facility]. Pt will need to sign waiver at check in.

## 2016-09-15 ENCOUNTER — Telehealth: Payer: Self-pay

## 2016-09-15 ENCOUNTER — Encounter (HOSPITAL_COMMUNITY): Payer: Medicare HMO

## 2016-09-15 NOTE — Telephone Encounter (Signed)
Called patient to let her know that we got an interation notice form her insurance company regarding flecainide and duloxetine. Per Dr. Caryl Comes patient will need to have and EKG. Patient is scheduled to have an echo on 09/17/16 at 2:00 and pt is agreeable to having EKG prior to that appt. Will put patient in Nurse Schedule for EKG visit. We are both agreeable to plan

## 2016-09-17 ENCOUNTER — Ambulatory Visit (HOSPITAL_COMMUNITY): Payer: Medicare HMO | Attending: Cardiology

## 2016-09-17 ENCOUNTER — Other Ambulatory Visit: Payer: Self-pay

## 2016-09-17 ENCOUNTER — Ambulatory Visit (INDEPENDENT_AMBULATORY_CARE_PROVIDER_SITE_OTHER): Payer: Medicare HMO | Admitting: *Deleted

## 2016-09-17 ENCOUNTER — Encounter (HOSPITAL_COMMUNITY): Admission: RE | Admit: 2016-09-17 | Payer: Medicare HMO | Source: Ambulatory Visit

## 2016-09-17 DIAGNOSIS — I480 Paroxysmal atrial fibrillation: Secondary | ICD-10-CM | POA: Diagnosis not present

## 2016-09-17 DIAGNOSIS — X58XXXA Exposure to other specified factors, initial encounter: Secondary | ICD-10-CM | POA: Diagnosis not present

## 2016-09-17 DIAGNOSIS — T887XXA Unspecified adverse effect of drug or medicament, initial encounter: Secondary | ICD-10-CM | POA: Diagnosis not present

## 2016-09-17 DIAGNOSIS — H04123 Dry eye syndrome of bilateral lacrimal glands: Secondary | ICD-10-CM | POA: Diagnosis not present

## 2016-09-17 DIAGNOSIS — I340 Nonrheumatic mitral (valve) insufficiency: Secondary | ICD-10-CM | POA: Diagnosis not present

## 2016-09-17 DIAGNOSIS — I509 Heart failure, unspecified: Secondary | ICD-10-CM | POA: Insufficient documentation

## 2016-09-17 DIAGNOSIS — H2513 Age-related nuclear cataract, bilateral: Secondary | ICD-10-CM | POA: Diagnosis not present

## 2016-09-17 DIAGNOSIS — H02403 Unspecified ptosis of bilateral eyelids: Secondary | ICD-10-CM | POA: Diagnosis not present

## 2016-09-17 MED ORDER — FLECAINIDE ACETATE 100 MG PO TABS: 50.0000 mg | ORAL_TABLET | Freq: Two times a day (BID) | ORAL | Status: DC

## 2016-09-17 NOTE — Progress Notes (Signed)
Patient presents to office for EKG per Dr. Caryl Comes and recent phone note r/e a possible interaction between cymbalta and flecainide. Patient has taken all doses of medications without any noted side effects. No c/o chest pain, dizziness or sob.

## 2016-09-17 NOTE — Addendum Note (Signed)
Addended byAlvis Lemmings C on: 09/17/2016 06:30 PM   Modules accepted: Orders

## 2016-09-17 NOTE — Progress Notes (Signed)
1.) Reason for visit: EKG  2.) Name of MD requesting visit: Caryl Comes  3.) H&P: Recent notification received via fax that there is a potential drug interaction between flecainide and cymbalta- this was reviewed by Dr. Caryl Comes and he advised the patient come in for an EKG to be done.   4.) ROS related to problem: Patient currently only with complaints of fatigue, but this has been ongoing for some time. She was started on flecainide 100 mg BID around 08/18/16 due to increased PVC burden- a follow up transmission on 08/28/16 showing PVC's down from 332.6/hr (7/5-7/19) to 1.5/hr (7/19-7/27).  5.) Assessment and plan per MD: EKG reviewed by Dr. Caryl Comes today. Previous EKG on 08/06/16 shows QRS duration of 144 ms and today QRS duration is 198 ms. Orders received to have the patient decrease flecainide to 50 mg BID and repeat an EKG in 1 week. The patient is aware of these recommendations and voices understanding. She will have a repeat EKG on 09/24/16 at 11:30 am.

## 2016-09-22 ENCOUNTER — Encounter (HOSPITAL_COMMUNITY): Admission: RE | Admit: 2016-09-22 | Payer: Medicare HMO | Source: Ambulatory Visit

## 2016-09-24 ENCOUNTER — Ambulatory Visit (INDEPENDENT_AMBULATORY_CARE_PROVIDER_SITE_OTHER): Payer: Medicare HMO | Admitting: *Deleted

## 2016-09-24 ENCOUNTER — Encounter (HOSPITAL_COMMUNITY)
Admission: RE | Admit: 2016-09-24 | Discharge: 2016-09-24 | Disposition: A | Discharge status: Home / Self Care | Payer: Medicare HMO | Source: Ambulatory Visit | Attending: Internal Medicine | Admitting: Internal Medicine

## 2016-09-24 VITALS — BP 118/60 | HR 60 | Wt 185.2 lb

## 2016-09-24 DIAGNOSIS — I493 Ventricular premature depolarization: Secondary | ICD-10-CM

## 2016-09-24 NOTE — Progress Notes (Signed)
1.) Reason for visit: EKG  2.) Name of MD requesting visit: Caryl Comes  3.) H&P: Patient recently started on flecainide for increased PVC burden (mid July). Notification received for potential drug-drug interaction for flecainide and cymbalta (started by PCP about the same time of flecainide). F/u EKG ordered for potential interaction. EKG done -      08/06/16 prior to flecainide showed QRS to be 144 ms          09/17/16- on flecainide 100 mg BID & Cymbalta 198           Ms- the patient was instructed by Dr. Caryl Comes                        this day to decrease flecainide to 50 mg                            BID and follow up with an EKG in 1 week  4.) ROS related to problem: Patient currently with complaints of fatigue, but this is not new for her. She is currently enrolled in cardiac rehab and doing well with this.   5.) Assessment and plan per MD: EKG today shows QRS to be 174 ms. Reviewed by Dr. Saunders Revel (DOD). Patient was instructed to maintain flecainide at 50 mg BID. No other changes made to her medications at this time. She is aware I will review further with Dr. Caryl Comes when he is back in the office on 10/02/16. She is agreeable.

## 2016-09-24 NOTE — Patient Instructions (Signed)
Medication Instructions: - Your physician recommends that you continue on your current medications as directed. Please refer to the Current Medication list given to you today.  Labwork: - none ordered  Procedures/Testing: - none ordered  Follow-Up: - as scheduled.   Any Additional Special Instructions Will Be Listed Below (If Applicable).     If you need a refill on your cardiac medications before your next appointment, please call your pharmacy.   

## 2016-09-25 LAB — CUP PACEART REMOTE DEVICE CHECK
Brady Statistic AP VP Percent: 0.07 %
Brady Statistic AP VS Percent: 0.02 %
Brady Statistic AS VS Percent: 1.69 %
Brady Statistic RV Percent Paced: 97.92 %
Date Time Interrogation Session: 20180807184948
HighPow Impedance: 67 Ohm
Implantable Lead Implant Date: 20150121
Implantable Lead Implant Date: 20150121
Implantable Lead Location: 753859
Lead Channel Impedance Value: 247 Ohm
Lead Channel Impedance Value: 342 Ohm
Lead Channel Impedance Value: 380 Ohm
Lead Channel Impedance Value: 494 Ohm
Lead Channel Impedance Value: 627 Ohm
Lead Channel Impedance Value: 627 Ohm
Lead Channel Impedance Value: 627 Ohm
Lead Channel Pacing Threshold Amplitude: 0.625 V
Lead Channel Pacing Threshold Amplitude: 2.125 V
Lead Channel Sensing Intrinsic Amplitude: 20.125 mV
Lead Channel Setting Pacing Amplitude: 1.75 V
Lead Channel Setting Pacing Amplitude: 2 V
Lead Channel Setting Pacing Amplitude: 3.25 V
Lead Channel Setting Pacing Pulse Width: 0.4 ms
Lead Channel Setting Pacing Pulse Width: 0.7 ms
MDC IDC LEAD IMPLANT DT: 20150121
MDC IDC LEAD LOCATION: 753858
MDC IDC LEAD LOCATION: 753860
MDC IDC MSMT BATTERY REMAINING LONGEVITY: 19 mo
MDC IDC MSMT BATTERY VOLTAGE: 2.92 V
MDC IDC MSMT LEADCHNL LV IMPEDANCE VALUE: 399 Ohm
MDC IDC MSMT LEADCHNL LV IMPEDANCE VALUE: 532 Ohm
MDC IDC MSMT LEADCHNL LV IMPEDANCE VALUE: 532 Ohm
MDC IDC MSMT LEADCHNL LV IMPEDANCE VALUE: 570 Ohm
MDC IDC MSMT LEADCHNL LV PACING THRESHOLD PULSEWIDTH: 0.7 ms
MDC IDC MSMT LEADCHNL RA IMPEDANCE VALUE: 437 Ohm
MDC IDC MSMT LEADCHNL RA PACING THRESHOLD AMPLITUDE: 0.875 V
MDC IDC MSMT LEADCHNL RA PACING THRESHOLD PULSEWIDTH: 0.4 ms
MDC IDC MSMT LEADCHNL RA SENSING INTR AMPL: 3.125 mV
MDC IDC MSMT LEADCHNL RA SENSING INTR AMPL: 3.125 mV
MDC IDC MSMT LEADCHNL RV IMPEDANCE VALUE: 532 Ohm
MDC IDC MSMT LEADCHNL RV PACING THRESHOLD PULSEWIDTH: 0.4 ms
MDC IDC MSMT LEADCHNL RV SENSING INTR AMPL: 20.125 mV
MDC IDC PG IMPLANT DT: 20150121
MDC IDC SET LEADCHNL RV SENSING SENSITIVITY: 0.3 mV
MDC IDC STAT BRADY AS VP PERCENT: 98.22 %
MDC IDC STAT BRADY RA PERCENT PACED: 0.09 %

## 2016-09-29 ENCOUNTER — Encounter (HOSPITAL_COMMUNITY): Payer: Medicare HMO

## 2016-10-01 ENCOUNTER — Encounter (HOSPITAL_COMMUNITY): Payer: Medicare HMO

## 2016-10-01 ENCOUNTER — Encounter (HOSPITAL_COMMUNITY): Payer: Self-pay | Admitting: Cardiology

## 2016-10-01 ENCOUNTER — Ambulatory Visit (HOSPITAL_COMMUNITY)
Admission: RE | Admit: 2016-10-01 | Discharge: 2016-10-01 | Disposition: A | Discharge status: Home / Self Care | Payer: Medicare HMO | Source: Ambulatory Visit | Attending: Cardiology | Admitting: Cardiology

## 2016-10-01 VITALS — BP 108/59 | HR 59 | Wt 186.5 lb

## 2016-10-01 DIAGNOSIS — I5022 Chronic systolic (congestive) heart failure: Secondary | ICD-10-CM | POA: Diagnosis not present

## 2016-10-01 DIAGNOSIS — R911 Solitary pulmonary nodule: Secondary | ICD-10-CM | POA: Diagnosis not present

## 2016-10-01 DIAGNOSIS — G629 Polyneuropathy, unspecified: Secondary | ICD-10-CM | POA: Insufficient documentation

## 2016-10-01 DIAGNOSIS — Z8249 Family history of ischemic heart disease and other diseases of the circulatory system: Secondary | ICD-10-CM | POA: Diagnosis not present

## 2016-10-01 DIAGNOSIS — E785 Hyperlipidemia, unspecified: Secondary | ICD-10-CM | POA: Diagnosis not present

## 2016-10-01 DIAGNOSIS — I428 Other cardiomyopathies: Secondary | ICD-10-CM

## 2016-10-01 DIAGNOSIS — I429 Cardiomyopathy, unspecified: Secondary | ICD-10-CM | POA: Insufficient documentation

## 2016-10-01 DIAGNOSIS — F329 Major depressive disorder, single episode, unspecified: Secondary | ICD-10-CM | POA: Insufficient documentation

## 2016-10-01 DIAGNOSIS — I5032 Chronic diastolic (congestive) heart failure: Secondary | ICD-10-CM | POA: Diagnosis not present

## 2016-10-01 DIAGNOSIS — I493 Ventricular premature depolarization: Secondary | ICD-10-CM | POA: Diagnosis not present

## 2016-10-01 DIAGNOSIS — Z79899 Other long term (current) drug therapy: Secondary | ICD-10-CM | POA: Insufficient documentation

## 2016-10-01 DIAGNOSIS — R69 Illness, unspecified: Secondary | ICD-10-CM | POA: Diagnosis not present

## 2016-10-01 LAB — BASIC METABOLIC PANEL
Anion gap: 7 (ref 5–15)
BUN: 20 mg/dL (ref 6–20)
CALCIUM: 9.7 mg/dL (ref 8.9–10.3)
CHLORIDE: 103 mmol/L (ref 101–111)
CO2: 28 mmol/L (ref 22–32)
CREATININE: 1.07 mg/dL — AB (ref 0.44–1.00)
GFR calc Af Amer: 54 mL/min — ABNORMAL LOW (ref >60)
GFR calc non Af Amer: 47 mL/min — ABNORMAL LOW (ref >60)
GLUCOSE: 97 mg/dL (ref 65–99)
Potassium: 4.8 mmol/L (ref 3.5–5.1)
Sodium: 138 mmol/L (ref 135–145)

## 2016-10-01 MED ORDER — SACUBITRIL-VALSARTAN 24-26 MG PO TABS: 1.0000 | ORAL_TABLET | Freq: Two times a day (BID) | ORAL | 12 refills | Status: DC

## 2016-10-01 NOTE — Patient Instructions (Signed)
Labs today (will call for abnormal results, otherwise no news is good news)  Take Flecainide 50 mg (0.5 Tablet) Twice Daily  Follow up in 4 Months

## 2016-10-01 NOTE — Progress Notes (Signed)
Medication Samples have been provided to the patient.  Drug name: Delene Loll   Strength: 24-26 mg        Qty: 2  LOT: [id number] Exp.Date: 11/18  Dosing instructions: Take 1 Tablet Twice Daily  The patient has been instructed regarding the correct time, dose, and frequency of taking this medication, including desired effects and most common side effects.   Darron Doom 12:37 PM 10/01/2016

## 2016-10-02 NOTE — Progress Notes (Signed)
Patient ID: Heather Smith, female   DOB: 13-Jul-1932, 83 y.o.   MRN: [id number] PCP: Dr. Kenton Kingfisher HF Cardiology: Aundra Dubin  83 yo with history of nonischemic cardiomyopathy and chronic systolic HF.  Patient has felt like her energy level has been low for several years.  In 9/14, she was admitted to [facility] with chest pain.  Cardiac cath was done, showing no significant coronary disease.  However, echo showed EF 30-35%.  She was started on meds for CHF, and echo was repeated in 12/14, EF was read as 20-25%.  She had a Medtonic CRT-D device placed (had baseline LBBB).  In 3/15, the LV lead was not capturing properly so device had to be reprogrammed.  She had AV optimization in 5/15.  Limited echo at that time showed improvement in EF to 40-45%.  CPX in 5/15 was submaximal but suggested low normal to mildly reduced functional capacity. Last echo in 6/16 showed EF 35-40% with diffuse hypokinesis.  CPX was done in 9/16.  This actually showed good functional capacity. She had repeat echo in 1/17, showing EF increased to 60-65%.  Most recent echo in 8/18 showed EF 50-55% with mild LV dilation and mild MR.    She was seen by Dr. Caryl Comes for frequent PVCs (symptomatic).  She was started on flecainide.  She thinks that this has helped .   She has also been going to pulmonary rehab. Symptomatically, she thinks she is doing somewhat better.  No dyspnea walking on flat ground.  She does get short of breath doing yardwork.  She is not feeling palpitations or lightheadedness.  No syncope. No chest pain.   Optivol: Stable impedance, fluid index < threshold, no AF/VT, >99% BiV pacing  Labs (9/14): TSH normal Labs (3/15): K 4.7, creatinine 1.1 Labs (5/15): K 4.9, creatinine 1.1, BNP 44 Labs (6/15): K 5.1, creatinine 1.4, pro-BNP 39 Labs (6/15): K 5.3, creatinine 1.53 Labs (12/15): K 4.4, creatinine 1.03 Labs (5/16): K 4, creatinine 1.0, BNP 34.6 Labs (6/16): K 4.3, creatinine 1.1 Labs (9/16): K 4.4, creatinine  1.16 Labs (11/16): K 4.5, creatinine 1.16 Labs (12/16): K 4.6, creatinine 1.16, BNP 22, Mg 1.9 Labs (1/17): K 4.3, creatinine 1.34, HCT 42.4, ESR normal Labs (6/17): K 4.6, creatinine 1.23, hgb 9.9, TSH normal Labs (6/18): K 4.7, creatinine 1.09, BNP 94, hgb 13.8, TSH normal  PMH: 1. Chronic systolic CHF: Nonischemic cardiomyopathy.  LHC (9/14) with no significant coronary disease, EF 25-30% with global hypokinesis.  Echo (9/14) with EF 30-35%, moderate LV dilation, diffuse hypokinesis.  Echo (12/14) with EF 20-25%.  She does not drink ETOH.  She had Medtronic CRT-D device implantation in 1/15. Echo (5/15) with EF 40-45%, mild LVH.  CPX (5/15) was submaximal with RER 0.99, peak VO2 14, VE/VCO2 37.3; low normal functional capacity may be limited by loss of BiV pacing at higher HR and by deconditioning but interpretation somewhat limited since study was submaximal.  Echo (6/16) with EF 35-40%, severe LV dilation, diffuse hypokinesis, mild MR.  CPX (9/16) with peak VO2 17.7, VE/VCO2 slope 32.3, RER 1.11 => excellent functional capacity. Echo (11/17) with EF 60-65%.  - Echo (8/18): EF 50-55%, mildly dilated LV, mild MR.  2. Back pain s/p lumbar laminectomy 3. OA: Knees. Left TKR in 6/17.  4. Hyperlipidemia 5. Depression 6. PVCs 7. Peripheral neuropathy 8. Lung nodule  SH: Lives [place], married, never smoked, no ETOH.    FH: Mother with MI at 20, possible ischemic cardiomyopathy.  Father with cardiomyopathy,  died at 74.  She has 5 brothers, none with significant coronary disease.    ROS: All systems reviewed and negative except as per HPI.   Current Outpatient Prescriptions  Medication Sig Dispense Refill  . acetaminophen (TYLENOL) 650 MG CR tablet Take 650 mg by mouth every 8 (eight) hours as needed for pain.    . Ascorbic Acid (VITAMIN C) 1000 MG tablet Take 1,000 mg by mouth daily.     . B Complex Vitamins (VITAMIN B COMPLEX PO) Take 1 tablet by mouth daily. Reported on 01/30/2015     . carvedilol (COREG) 6.25 MG tablet TAKE ONE TABLET BY MOUTH TWICE DAILY 180 tablet 3  . Coenzyme Q10 (COQ10) 100 MG CAPS Take 1 capsule by mouth daily as needed.     . DULoxetine (CYMBALTA) 30 MG capsule Take 30 mg by mouth daily.     . flecainide (TAMBOCOR) 100 MG tablet Take 0.5 tablets (50 mg total) by mouth 2 (two) times daily.    Marland Kitchen gabapentin (NEURONTIN) 100 MG capsule Take 100 mg by mouth at bedtime.    Marland Kitchen levothyroxine (SYNTHROID, LEVOTHROID) 50 MCG tablet Take 50 mcg by mouth daily.      . Magnesium Oxide (MAG-200 PO) Take 1 tablet by mouth 2 (two) times daily.     . multivitamin (THERAGRAN) per tablet Take 1 tablet by mouth daily.      . sacubitril-valsartan (ENTRESTO) 24-26 MG Take 1 tablet by mouth 2 (two) times daily. 60 tablet 12  . sacubitril-valsartan (ENTRESTO) 24-26 MG Take 1 tablet by mouth 2 (two) times daily. 60 tablet 12  . sacubitril-valsartan (ENTRESTO) 24-26 MG Take 1 tablet by mouth 2 (two) times daily. 60 tablet 12  . spironolactone (ALDACTONE) 25 MG tablet Take 25 mg by mouth daily.     No current facility-administered medications for this encounter.    Vitals:   10/01/16 1143  BP: (!) 108/59  Pulse: (!) 59  SpO2: 98%  Weight: 186 lb 8 oz (84.6 kg)    General: NAD Neck: No JVD, no thyromegaly or thyroid nodule.  Lungs: Clear to auscultation bilaterally with normal respiratory effort. CV: Nondisplaced PMI.  Heart regular S1/S2, no S3/S4, no murmur.  No peripheral edema.  No carotid bruit.  Normal pedal pulses.  Abdomen: Soft, nontender, no hepatosplenomegaly, no distention.  Skin: Intact without lesions or rashes.  Neurologic: Alert and oriented x 3.  Psych: Normal affect. Extremities: No clubbing or cyanosis.  HEENT: Normal.   Assessment/Plan:  1. Chronic systolic CHF: Nonischemic cardiomyopathy s/p Medtronic CRT-D. EF 35-40% (6/16). She does have a family history of cardiomyopathy of uncertain etiology (father) but none of her siblings developed a  cardiomyopathy.  Familial cardiomyopathy is a consideration.  Viral myocarditis is also a possible etiology.  Echo in 1/17 showed improvement in EF to 60-65%, echo done today was reviewed => EF 50-55%.  Symptomatically seems to be doing better with PVC suppression (suspect this is helping her BiV pacing percentage, now >99%).  Volume status looks ok by exam and Optivol.  - Continue pulmonary rehab.  - Continue current Entresto, Coreg, and spironolactone.  BMET today.  2. Depression: Improved on Celexa.  3. PVCs: Very frequent until flecainide started. She is taking it 100 mg daily, I asked her to change to 50 mg bid.   4. Lung nodule: Stable by CT.  Need CT chest in 2/19 to followup on this.   Loralie Champagne 10/02/2016

## 2016-10-06 ENCOUNTER — Encounter (HOSPITAL_COMMUNITY)
Admission: RE | Admit: 2016-10-06 | Discharge: 2016-10-06 | Disposition: A | Discharge status: Home / Self Care | Payer: Medicare HMO | Source: Ambulatory Visit | Attending: Internal Medicine | Admitting: Internal Medicine

## 2016-10-06 DIAGNOSIS — I509 Heart failure, unspecified: Secondary | ICD-10-CM | POA: Diagnosis not present

## 2016-10-08 ENCOUNTER — Encounter (HOSPITAL_COMMUNITY)
Admission: RE | Admit: 2016-10-08 | Discharge: 2016-10-08 | Disposition: A | Discharge status: Home / Self Care | Payer: Medicare HMO | Source: Ambulatory Visit | Attending: Internal Medicine | Admitting: Internal Medicine

## 2016-10-13 ENCOUNTER — Encounter (HOSPITAL_COMMUNITY): Payer: Medicare HMO

## 2016-10-14 ENCOUNTER — Telehealth (HOSPITAL_COMMUNITY): Payer: Self-pay | Admitting: Pharmacist

## 2016-10-14 NOTE — Telephone Encounter (Signed)
Was able to enroll [name] in a 2nd [employer] so that she will have another $800 to use toward her Delene Loll copay costs. Called and informed patient of renewal.   Patient Name - Heather Smith DOB - November 18, 1932 Member ID - [id number] Disease Fund - Heart Failure 2nd grant amount - $800  Total remaining balance - $800  Eligibility End Date - 11/24/2016  Claims Submission End Date - 03/24/2017    Ruta Hinds. Velva Harman, PharmD, BCPS, CPP Clinical Pharmacist Pager: [phone] Phone: [phone] 10/14/2016 11:55 AM

## 2016-10-15 ENCOUNTER — Encounter (HOSPITAL_COMMUNITY): Payer: Medicare HMO

## 2016-10-20 ENCOUNTER — Encounter (HOSPITAL_COMMUNITY): Admission: RE | Admit: 2016-10-20 | Payer: Medicare HMO | Source: Ambulatory Visit

## 2016-10-21 ENCOUNTER — Other Ambulatory Visit: Payer: Self-pay | Admitting: Cardiology

## 2016-10-22 ENCOUNTER — Encounter (HOSPITAL_COMMUNITY): Payer: Medicare HMO

## 2016-10-27 ENCOUNTER — Encounter (HOSPITAL_COMMUNITY): Payer: Medicare HMO

## 2016-10-29 ENCOUNTER — Encounter (HOSPITAL_COMMUNITY): Payer: Self-pay | Admitting: *Deleted

## 2016-10-29 ENCOUNTER — Encounter (HOSPITAL_COMMUNITY): Payer: Medicare HMO

## 2016-10-29 DIAGNOSIS — C44311 Basal cell carcinoma of skin of nose: Secondary | ICD-10-CM | POA: Diagnosis not present

## 2016-10-29 DIAGNOSIS — D485 Neoplasm of uncertain behavior of skin: Secondary | ICD-10-CM | POA: Diagnosis not present

## 2016-10-29 DIAGNOSIS — C44722 Squamous cell carcinoma of skin of right lower limb, including hip: Secondary | ICD-10-CM | POA: Diagnosis not present

## 2016-10-29 DIAGNOSIS — L570 Actinic keratosis: Secondary | ICD-10-CM | POA: Diagnosis not present

## 2016-10-29 NOTE — Progress Notes (Signed)
Heather Smith has decided to drop out of the Pulmonary Rehab Maintenance Program at this time due to some health issues she is having at this time. I have encouraged her to let us know if she would like to like to re-enroll in the future.

## 2016-11-03 ENCOUNTER — Encounter (HOSPITAL_COMMUNITY): Payer: Self-pay

## 2016-11-05 ENCOUNTER — Encounter (HOSPITAL_COMMUNITY): Payer: Self-pay

## 2016-11-10 ENCOUNTER — Encounter (HOSPITAL_COMMUNITY): Payer: Self-pay

## 2016-11-11 ENCOUNTER — Other Ambulatory Visit (HOSPITAL_COMMUNITY): Payer: Self-pay | Admitting: Pharmacist

## 2016-11-11 MED ORDER — SACUBITRIL-VALSARTAN 24-26 MG PO TABS: 1.0000 | ORAL_TABLET | Freq: Two times a day (BID) | ORAL | 3 refills | Status: DC

## 2016-11-11 NOTE — Telephone Encounter (Signed)
Ms. Pickney called stating that [facility] was not able to fill her Entresto through the [facility] even though she still has $800 to use toward her copays. I have called [facility] and they were able to fill a 90 day supply for no charge. Relayed this information to Ms. Mendel who was grateful for the assistance.   Ruta Hinds. Velva Harman, PharmD, BCPS, CPP Clinical Pharmacist Pager: [phone] Phone: [phone] 11/11/2016 3:17 PM

## 2016-11-12 ENCOUNTER — Encounter (HOSPITAL_COMMUNITY): Payer: Self-pay

## 2016-11-17 ENCOUNTER — Encounter (HOSPITAL_COMMUNITY): Payer: Self-pay

## 2016-11-19 ENCOUNTER — Encounter (HOSPITAL_COMMUNITY): Payer: Self-pay

## 2016-11-19 ENCOUNTER — Ambulatory Visit (INDEPENDENT_AMBULATORY_CARE_PROVIDER_SITE_OTHER): Payer: Medicare HMO | Admitting: *Deleted

## 2016-11-19 DIAGNOSIS — I5022 Chronic systolic (congestive) heart failure: Secondary | ICD-10-CM

## 2016-11-19 DIAGNOSIS — I428 Other cardiomyopathies: Secondary | ICD-10-CM | POA: Diagnosis not present

## 2016-11-19 NOTE — Progress Notes (Signed)
Remote ICD transmission.   

## 2016-11-24 ENCOUNTER — Encounter (HOSPITAL_COMMUNITY): Payer: Self-pay

## 2016-11-26 ENCOUNTER — Encounter (HOSPITAL_COMMUNITY): Payer: Self-pay

## 2016-11-26 ENCOUNTER — Encounter: Payer: Self-pay | Admitting: Cardiology

## 2016-11-27 DIAGNOSIS — C44722 Squamous cell carcinoma of skin of right lower limb, including hip: Secondary | ICD-10-CM | POA: Diagnosis not present

## 2016-12-01 ENCOUNTER — Encounter (HOSPITAL_COMMUNITY): Payer: Self-pay

## 2016-12-01 ENCOUNTER — Other Ambulatory Visit (HOSPITAL_COMMUNITY): Payer: Self-pay | Admitting: Cardiology

## 2016-12-01 DIAGNOSIS — Z85828 Personal history of other malignant neoplasm of skin: Secondary | ICD-10-CM | POA: Diagnosis not present

## 2016-12-01 DIAGNOSIS — C44311 Basal cell carcinoma of skin of nose: Secondary | ICD-10-CM | POA: Diagnosis not present

## 2016-12-03 ENCOUNTER — Encounter (HOSPITAL_COMMUNITY): Payer: Self-pay

## 2016-12-11 LAB — CUP PACEART REMOTE DEVICE CHECK
Battery Remaining Longevity: 18 mo
Battery Voltage: 2.92 V
Brady Statistic AS VS Percent: 1.59 %
Brady Statistic RA Percent Paced: 0.2 %
Brady Statistic RV Percent Paced: 97.96 %
HighPow Impedance: 74 Ohm
Implantable Lead Implant Date: 20150121
Implantable Lead Location: 753858
Implantable Lead Model: 4298
Implantable Pulse Generator Implant Date: 20150121
Lead Channel Impedance Value: 380 Ohm
Lead Channel Impedance Value: 437 Ohm
Lead Channel Impedance Value: 456 Ohm
Lead Channel Impedance Value: 494 Ohm
Lead Channel Impedance Value: 513 Ohm
Lead Channel Impedance Value: 532 Ohm
Lead Channel Impedance Value: 627 Ohm
Lead Channel Pacing Threshold Pulse Width: 0.4 ms
Lead Channel Pacing Threshold Pulse Width: 0.4 ms
Lead Channel Sensing Intrinsic Amplitude: 13 mV
Lead Channel Sensing Intrinsic Amplitude: 13 mV
Lead Channel Sensing Intrinsic Amplitude: 3 mV
Lead Channel Setting Pacing Amplitude: 1.5 V
Lead Channel Setting Pacing Amplitude: 2.75 V
Lead Channel Setting Pacing Pulse Width: 0.4 ms
Lead Channel Setting Pacing Pulse Width: 0.7 ms
MDC IDC LEAD IMPLANT DT: 20150121
MDC IDC LEAD IMPLANT DT: 20150121
MDC IDC LEAD LOCATION: 753859
MDC IDC LEAD LOCATION: 753860
MDC IDC MSMT LEADCHNL LV IMPEDANCE VALUE: 266 Ohm
MDC IDC MSMT LEADCHNL LV IMPEDANCE VALUE: 380 Ohm
MDC IDC MSMT LEADCHNL LV IMPEDANCE VALUE: 627 Ohm
MDC IDC MSMT LEADCHNL LV IMPEDANCE VALUE: 665 Ohm
MDC IDC MSMT LEADCHNL LV IMPEDANCE VALUE: 703 Ohm
MDC IDC MSMT LEADCHNL LV PACING THRESHOLD AMPLITUDE: 1.625 V
MDC IDC MSMT LEADCHNL LV PACING THRESHOLD PULSEWIDTH: 0.7 ms
MDC IDC MSMT LEADCHNL RA IMPEDANCE VALUE: 399 Ohm
MDC IDC MSMT LEADCHNL RA PACING THRESHOLD AMPLITUDE: 0.75 V
MDC IDC MSMT LEADCHNL RA SENSING INTR AMPL: 3 mV
MDC IDC MSMT LEADCHNL RV PACING THRESHOLD AMPLITUDE: 0.5 V
MDC IDC SESS DTM: 20181018083824
MDC IDC SET LEADCHNL RV PACING AMPLITUDE: 2 V
MDC IDC SET LEADCHNL RV SENSING SENSITIVITY: 0.3 mV
MDC IDC STAT BRADY AP VP PERCENT: 0.18 %
MDC IDC STAT BRADY AP VS PERCENT: 0.02 %
MDC IDC STAT BRADY AS VP PERCENT: 98.22 %

## 2017-01-05 DIAGNOSIS — G6289 Other specified polyneuropathies: Secondary | ICD-10-CM | POA: Diagnosis not present

## 2017-01-05 DIAGNOSIS — G629 Polyneuropathy, unspecified: Secondary | ICD-10-CM | POA: Diagnosis not present

## 2017-01-05 DIAGNOSIS — R7303 Prediabetes: Secondary | ICD-10-CM | POA: Diagnosis not present

## 2017-01-05 DIAGNOSIS — E782 Mixed hyperlipidemia: Secondary | ICD-10-CM | POA: Diagnosis not present

## 2017-01-05 DIAGNOSIS — N183 Chronic kidney disease, stage 3 (moderate): Secondary | ICD-10-CM | POA: Diagnosis not present

## 2017-01-05 DIAGNOSIS — R69 Illness, unspecified: Secondary | ICD-10-CM | POA: Diagnosis not present

## 2017-01-05 DIAGNOSIS — I10 Essential (primary) hypertension: Secondary | ICD-10-CM | POA: Diagnosis not present

## 2017-01-13 ENCOUNTER — Encounter: Payer: Self-pay | Admitting: Internal Medicine

## 2017-01-19 ENCOUNTER — Encounter: Payer: Self-pay | Admitting: Internal Medicine

## 2017-01-19 ENCOUNTER — Ambulatory Visit: Payer: Medicare HMO | Admitting: Internal Medicine

## 2017-01-19 VITALS — BP 118/76 | HR 68 | Ht 67.5 in | Wt 189.8 lb

## 2017-01-19 DIAGNOSIS — I5022 Chronic systolic (congestive) heart failure: Secondary | ICD-10-CM | POA: Diagnosis not present

## 2017-01-19 DIAGNOSIS — I493 Ventricular premature depolarization: Secondary | ICD-10-CM

## 2017-01-19 DIAGNOSIS — I480 Paroxysmal atrial fibrillation: Secondary | ICD-10-CM

## 2017-01-19 DIAGNOSIS — Z95810 Presence of automatic (implantable) cardiac defibrillator: Secondary | ICD-10-CM | POA: Diagnosis not present

## 2017-01-19 DIAGNOSIS — I447 Left bundle-branch block, unspecified: Secondary | ICD-10-CM | POA: Diagnosis not present

## 2017-01-19 DIAGNOSIS — I428 Other cardiomyopathies: Secondary | ICD-10-CM

## 2017-01-19 LAB — CUP PACEART INCLINIC DEVICE CHECK
Battery Remaining Longevity: 18 mo
Battery Voltage: 2.91 V
Brady Statistic AS VS Percent: 1.62 %
Brady Statistic RV Percent Paced: 97.43 %
HIGH POWER IMPEDANCE MEASURED VALUE: 68 Ohm
Implantable Lead Implant Date: 20150121
Implantable Lead Location: 753859
Implantable Lead Model: 4298
Lead Channel Impedance Value: 494 Ohm
Lead Channel Impedance Value: 513 Ohm
Lead Channel Impedance Value: 532 Ohm
Lead Channel Impedance Value: 532 Ohm
Lead Channel Impedance Value: 627 Ohm
Lead Channel Impedance Value: 722 Ohm
Lead Channel Impedance Value: 817 Ohm
Lead Channel Pacing Threshold Amplitude: 0.75 V
Lead Channel Pacing Threshold Amplitude: 1.625 V
Lead Channel Sensing Intrinsic Amplitude: 15.125 mV
Lead Channel Sensing Intrinsic Amplitude: 16.5 mV
Lead Channel Sensing Intrinsic Amplitude: 3.25 mV
Lead Channel Setting Pacing Amplitude: 2 V
Lead Channel Setting Pacing Amplitude: 2.75 V
Lead Channel Setting Pacing Pulse Width: 0.4 ms
Lead Channel Setting Pacing Pulse Width: 0.7 ms
MDC IDC LEAD IMPLANT DT: 20150121
MDC IDC LEAD IMPLANT DT: 20150121
MDC IDC LEAD LOCATION: 753858
MDC IDC LEAD LOCATION: 753860
MDC IDC MSMT LEADCHNL LV IMPEDANCE VALUE: 266 Ohm
MDC IDC MSMT LEADCHNL LV IMPEDANCE VALUE: 380 Ohm
MDC IDC MSMT LEADCHNL LV IMPEDANCE VALUE: 399 Ohm
MDC IDC MSMT LEADCHNL LV IMPEDANCE VALUE: 532 Ohm
MDC IDC MSMT LEADCHNL LV IMPEDANCE VALUE: 817 Ohm
MDC IDC MSMT LEADCHNL LV PACING THRESHOLD PULSEWIDTH: 0.7 ms
MDC IDC MSMT LEADCHNL RA IMPEDANCE VALUE: 437 Ohm
MDC IDC MSMT LEADCHNL RA PACING THRESHOLD PULSEWIDTH: 0.4 ms
MDC IDC MSMT LEADCHNL RA SENSING INTR AMPL: 3.25 mV
MDC IDC MSMT LEADCHNL RV PACING THRESHOLD AMPLITUDE: 0.5 V
MDC IDC MSMT LEADCHNL RV PACING THRESHOLD PULSEWIDTH: 0.4 ms
MDC IDC PG IMPLANT DT: 20150121
MDC IDC SESS DTM: 20181218153312
MDC IDC SET LEADCHNL RA PACING AMPLITUDE: 1.5 V
MDC IDC SET LEADCHNL RV SENSING SENSITIVITY: 0.3 mV
MDC IDC STAT BRADY AP VP PERCENT: 0.36 %
MDC IDC STAT BRADY AP VS PERCENT: 0.02 %
MDC IDC STAT BRADY AS VP PERCENT: 98 %
MDC IDC STAT BRADY RA PERCENT PACED: 0.38 %

## 2017-01-19 NOTE — Progress Notes (Signed)
/      Patient Care Team: Shirline Frees, MD as PCP - General   HPI  Heather Smith is a 83 y.o. female Seen in followup for CRT implantation for nonischemic cardiomyopathy and depressed LV function. This was accomplished fall 2014  Interval partial lead dislodgment noted after she saw Dr. Mariana Single in March and an x-ray was obtained and reviewed. This  x-ray was reviewed. We reprogrammed the device    I reviewed with her the data from her CPX 9/16 showed excellent functional capacity and her echo from last week demonstrated normal LV function  DATE TEST    12/14 Echo EF 20-25%   1/17    Echo   EF 55-62 %   1/18    Echo   EF 55-60 %          At her last visit she was noted to have frequent PVCs.  Flecainide was initiated.  They are markedly attenuated and she feels much much better.  She does note her heart rate is still somewhat slow and she continues to complain of some fatigue.  No chest pain or edema   Date Cr K  8//18 1.07 4.8        DATE QRSduration Flecainide  7/18 144    12/18 168 50      Past Medical History:  Diagnosis Date  . Automatic implantable cardioverter-defibrillator in situ   . Biventricular defibrillator  Medtronic 05/30/2013  . Chest pain    a. Nl cath in the 90's;  b. 04/2005: Myoview: subtle areao of ? reversibility in apical segment of anteromedial LV - ? attenuation, EF 54%;  c. 05/2012 MV: small, fixed mild apical septal perfusion defect, ? attenuation due to lbbb, no ischemia.  . CHF (congestive heart failure) ([place])   . Chronic systolic heart failure ([place])   . Complication of anesthesia    difficult to wake up and blood pressure drops  . Family history of adverse reaction to anesthesia    difficulty to wake up (daughter)  . GERD (gastroesophageal reflux disease)   . Hypercholesterolemia   . Hypertension   . Hypothyroidism   . Left bundle branch block   . LV lead partial dislodgment 05/30/2013  . Neurogenic claudication due to lumbar spinal  stenosis   . Non-ischemic cardiomyopathy ([place])    [place] 10/04/12: Normal coronary arteries, EF 25-30% with global HK.  Echocardiogram 10/05/12: EF 30-35%, diffuse HK, moderate LAE. Echocardiogram (01/2013): Lateral to septal wall dyssynchrony EF 20-25%, diffuse HK  . Osteoarthritis    "about all over" (02/22/2013)  . Shortness of breath dyspnea    occasional b/c of CHF  . Spinal stenosis   . Vitamin D deficiency     Past Surgical History:  Procedure Laterality Date  . APPENDECTOMY    . BI-VENTRICULAR IMPLANTABLE CARDIOVERTER DEFIBRILLATOR N/A 02/22/2013   Procedure: BI-VENTRICULAR IMPLANTABLE CARDIOVERTER DEFIBRILLATOR  (CRT-D);  Surgeon: Deboraha Sprang, MD;  Location: [facility] C[facility] LAB;  Service: Cardiovascular;  Laterality: N/A;  . BLEPHAROPLASTY Bilateral   . CARDIAC CATHETERIZATION  2000  . CARDIAC DEFIBRILLATOR PLACEMENT  02/22/2013   with LV lead placement and HV lead testing/notes 02/22/2013  . EXCISIONAL HEMORRHOIDECTOMY    . FOOT ARTHRODESIS, MODIFIED [place] Bilateral   . KNEE ARTHROSCOPY Left   . KNEE ARTHROSCOPY Right   . LEFT HEART CATHETERIZATION WITH CORONARY ANGIOGRAM N/A 10/04/2012   Procedure: LEFT HEART CATHETERIZATION WITH CORONARY ANGIOGRAM;  Surgeon: Peter M Martinique, MD;  Location: [facility] CATH LAB;  Service: Cardiovascular;  Laterality: N/A;  . LUMBAR LAMINECTOMY  11/03/2006    Bilateral 3, 4, 5 laminectomy, partial L2, decompression of the thecal sac, foraminotomy, posterolateral arthrodesis L3-S1 with autograft   -- SURGEON:  Leeroy Cha, M.D.   . TONSILLECTOMY    . TOTAL KNEE ARTHROPLASTY Left 07/15/2015   Procedure: LEFT TOTAL KNEE ARTHROPLASTY;  Surgeon: Frederik Pear, MD;  Location: [facility];  Service: Orthopedics;  Laterality: Left;  . TUBAL LIGATION    . VAGINAL HYSTERECTOMY      Current Outpatient Medications  Medication Sig Dispense Refill  . acetaminophen (TYLENOL) 650 MG CR tablet Take 650 mg by mouth every 8 (eight) hours as needed for pain.    . Ascorbic Acid (VITAMIN  C) 1000 MG tablet Take 1,000 mg by mouth daily.     . B Complex Vitamins (VITAMIN B COMPLEX PO) Take 1 tablet by mouth daily. Reported on 01/30/2015    . carvedilol (COREG) 6.25 MG tablet TAKE ONE TABLET BY MOUTH TWICE DAILY 180 tablet 3  . Coenzyme Q10 (COQ10) 100 MG CAPS Take 1 capsule by mouth daily as needed.     . DULoxetine (CYMBALTA) 30 MG capsule Take 30 mg by mouth daily.     . flecainide (TAMBOCOR) 100 MG tablet Take 0.5 tablets (50 mg total) by mouth 2 (two) times daily.    Marland Kitchen gabapentin (NEURONTIN) 100 MG capsule Take 100 mg by mouth at bedtime.    Marland Kitchen levothyroxine (SYNTHROID, LEVOTHROID) 50 MCG tablet Take 50 mcg by mouth daily.      . Magnesium Oxide (MAG-200 PO) Take 1 tablet by mouth 2 (two) times daily.     . multivitamin (THERAGRAN) per tablet Take 1 tablet by mouth daily.      . rosuvastatin (CRESTOR) 5 MG tablet Take 1 tablet by mouth as directed. Pt takes one tablet 2 times a week    . sacubitril-valsartan (ENTRESTO) 24-26 MG Take 1 tablet by mouth 2 (two) times daily. 180 tablet 3  . spironolactone (ALDACTONE) 25 MG tablet Take 25 mg by mouth daily.    Marland Kitchen spironolactone (ALDACTONE) 25 MG tablet TAKE ONE TABLET BY MOUTH ONCE DAILY 90 tablet 3   No current facility-administered medications for this visit.     Allergies  Allergen Reactions  . Statins Other (See Comments)    "Makes her bones hurt and very sore"  . Niaspan [Niacin Er]     High doses, causes hot flashes & aching   . Benadryl [Diphenhydramine] Other (See Comments)    Makes pt feel jittery   . Dilaudid [Hydromorphone Hcl] Itching    Review of Systems negative except from HPI and PMH  Physical Exam BP 118/76   Pulse 68   Ht 5' 7.5" (1.715 m)   Wt 189 lb 12.8 oz (86.1 kg)   SpO2 97%   BMI 29.29 kg/m  Well developed and nourished in no acute distress HENT normal Neck supple with JVP-flat Carotids brisk and full without bruits Clear Regular rate and rhythm, no murmurs or gallops Abd-soft with  active BS without hepatomegaly No Clubbing cyanosis edema Skin-warm and dry A & Oriented  Grossly normal sensory and motor function  Affect much more engaingin   ECG undertaken for ventricular ectopy demonstrates P synchronous pacing    Assessment and  Plan  Nonischemic cardiomyopathy-- interval normalization  Congestive heart failure-chronic-diastolic Euvolemic continue current meds  Medtronic ICD-CRT The patient's device was interrogated.  The information was reviewed. No changes were made in  the programming.     Atrial fibrillation-paroxysmal  LV threshold change  Chronotropic incompetence  Depression   PVCs  Hypotension     She is euvolemic.  We will continue her current medications.  PVC burden is markedly attenuated from 17%--0.3% we will continue her on her flecainide.  QRS duration is prolonged about 10%.  We will continue her on guideline directed therapy.  We will check a metabolic profile given her Aldactone.  She is she is identified as having chronotropic incompetence.  This may be contributing to her exercise intolerance.  We will activate rate response.  We have advised her to let us know in the event that she feels worse.  Her mood is much improved  No interval atrial fibrillation of any significance; no indication for anticoagulation

## 2017-01-19 NOTE — Patient Instructions (Addendum)
Medication Instructions: Your physician recommends that you continue on your current medications as directed. Please refer to the Current Medication list given to you today.  Labwork: Your physician has recommended that you have lab work today: BMET    Procedures/Testing: None Ordered  Follow-Up: Your physician wants you to follow-up in: 1 YEAR with Dr. Caryl Comes. You will receive a reminder letter in the mail two months in advance. If you don't receive a letter, please call our office to schedule the follow-up appointment.  Remote monitoring is used to monitor your ICD from home. This monitoring reduces the number of office visits required to check your device to one time per year. It allows [place] to keep an eye on the functioning of your device to ensure it is working properly. You are scheduled for a device check from home on 02/18/17. You may send your transmission at any time that day. If you have a wireless device, the transmission will be sent automatically. After your physician reviews your transmission, you will receive a postcard with your next transmission date.   If you need a refill on your cardiac medications before your next appointment, please call your pharmacy.

## 2017-01-20 LAB — BASIC METABOLIC PANEL
BUN/Creatinine Ratio: 18 (ref 12–28)
BUN: 18 mg/dL (ref 8–27)
CO2: 24 mmol/L (ref 20–29)
CREATININE: 0.98 mg/dL (ref 0.57–1.00)
Calcium: 9.5 mg/dL (ref 8.7–10.3)
Chloride: 102 mmol/L (ref 96–106)
GFR, EST AFRICAN AMERICAN: 62 mL/min/{1.73_m2} (ref >59)
GFR, EST NON AFRICAN AMERICAN: 54 mL/min/{1.73_m2} — AB (ref >59)
Glucose: 98 mg/dL (ref 65–99)
Potassium: 4.7 mmol/L (ref 3.5–5.2)
SODIUM: 142 mmol/L (ref 134–144)

## 2017-01-21 ENCOUNTER — Telehealth: Payer: Self-pay

## 2017-01-21 NOTE — Telephone Encounter (Signed)
Pt is aware and agreeable to lab results. 

## 2017-01-29 ENCOUNTER — Encounter (HOSPITAL_COMMUNITY): Payer: Medicare HMO | Admitting: Cardiology

## 2017-02-01 ENCOUNTER — Ambulatory Visit (HOSPITAL_COMMUNITY)
Admission: RE | Admit: 2017-02-01 | Discharge: 2017-02-01 | Disposition: A | Discharge status: Home / Self Care | Payer: Medicare HMO | Source: Ambulatory Visit | Attending: Cardiology | Admitting: Cardiology

## 2017-02-01 ENCOUNTER — Encounter (HOSPITAL_COMMUNITY): Payer: Self-pay | Admitting: Cardiology

## 2017-02-01 VITALS — BP 113/80 | HR 84 | Wt 193.0 lb

## 2017-02-01 DIAGNOSIS — I493 Ventricular premature depolarization: Secondary | ICD-10-CM | POA: Diagnosis not present

## 2017-02-01 DIAGNOSIS — Z981 Arthrodesis status: Secondary | ICD-10-CM | POA: Diagnosis not present

## 2017-02-01 DIAGNOSIS — R911 Solitary pulmonary nodule: Secondary | ICD-10-CM | POA: Diagnosis not present

## 2017-02-01 DIAGNOSIS — I5022 Chronic systolic (congestive) heart failure: Secondary | ICD-10-CM | POA: Insufficient documentation

## 2017-02-01 DIAGNOSIS — Z9889 Other specified postprocedural states: Secondary | ICD-10-CM | POA: Insufficient documentation

## 2017-02-01 DIAGNOSIS — E785 Hyperlipidemia, unspecified: Secondary | ICD-10-CM | POA: Diagnosis not present

## 2017-02-01 DIAGNOSIS — G629 Polyneuropathy, unspecified: Secondary | ICD-10-CM | POA: Diagnosis not present

## 2017-02-01 DIAGNOSIS — F329 Major depressive disorder, single episode, unspecified: Secondary | ICD-10-CM | POA: Insufficient documentation

## 2017-02-01 DIAGNOSIS — R69 Illness, unspecified: Secondary | ICD-10-CM | POA: Diagnosis not present

## 2017-02-01 DIAGNOSIS — Z95810 Presence of automatic (implantable) cardiac defibrillator: Secondary | ICD-10-CM | POA: Diagnosis not present

## 2017-02-01 DIAGNOSIS — Z79899 Other long term (current) drug therapy: Secondary | ICD-10-CM | POA: Insufficient documentation

## 2017-02-01 DIAGNOSIS — Z8249 Family history of ischemic heart disease and other diseases of the circulatory system: Secondary | ICD-10-CM | POA: Diagnosis not present

## 2017-02-01 DIAGNOSIS — M170 Bilateral primary osteoarthritis of knee: Secondary | ICD-10-CM | POA: Diagnosis not present

## 2017-02-01 DIAGNOSIS — I429 Cardiomyopathy, unspecified: Secondary | ICD-10-CM | POA: Insufficient documentation

## 2017-02-01 DIAGNOSIS — Z96651 Presence of right artificial knee joint: Secondary | ICD-10-CM | POA: Insufficient documentation

## 2017-02-01 NOTE — Progress Notes (Signed)
Patient ID: KEENYA MATERA, female   DOB: 08-Dec-1932, 83 y.o.   MRN: [id number] PCP: Dr. Kenton Kingfisher HF Cardiology: Aundra Dubin  83 yo with history of nonischemic cardiomyopathy and chronic systolic HF.  In 9/14, she was admitted to [facility] with chest pain.  Cardiac cath was done, showing no significant coronary disease.  However, echo showed EF 30-35%.  She was started on meds for CHF, and echo was repeated in 12/14, EF was read as 20-25%.  She had a Medtonic CRT-D device placed (had baseline LBBB).  In 3/15, the LV lead was not capturing properly so device had to be reprogrammed.  She had AV optimization in 5/15.  Limited echo at that time showed improvement in EF to 40-45%.  CPX in 5/15 was submaximal but suggested low normal to mildly reduced functional capacity. Last echo in 6/16 showed EF 35-40% with diffuse hypokinesis.  CPX was done in 9/16.  This actually showed good functional capacity. She had repeat echo in 1/17, showing EF increased to 60-65%.  Most recent echo in 8/18 showed EF 50-55% with mild LV dilation and mild MR.    She was seen by Dr. Caryl Comes for frequent PVCs (symptomatic).  She was started on flecainide.  She thinks that this has helped .   She returns for followup of CHF.  She has been doing well generally.  Weight is up about 7 lbs, she attributes this to increased caloric intake around the holidays. No dyspnea with her normal activities.  She will get short of breath if she walks a long distance . No chest pain.  No orthopnea/PND.  She has had a chronic cough for a couple of months.  Does have GERD and takes omeprazole but not regularly. Atrial rate increased recently from 40 to 60 by Dr. Caryl Comes.  She feels like her endurance has improved.    Medtronic device interrogation: Stable impedance, fluid index < threshold, no AF/VT, >99% BiV pacing  Labs (9/14): TSH normal Labs (3/15): K 4.7, creatinine 1.1 Labs (5/15): K 4.9, creatinine 1.1, BNP 44 Labs (6/15): K 5.1, creatinine 1.4, pro-BNP  39 Labs (6/15): K 5.3, creatinine 1.53 Labs (12/15): K 4.4, creatinine 1.03 Labs (5/16): K 4, creatinine 1.0, BNP 34.6 Labs (6/16): K 4.3, creatinine 1.1 Labs (9/16): K 4.4, creatinine 1.16 Labs (11/16): K 4.5, creatinine 1.16 Labs (12/16): K 4.6, creatinine 1.16, BNP 22, Mg 1.9 Labs (1/17): K 4.3, creatinine 1.34, HCT 42.4, ESR normal Labs (6/17): K 4.6, creatinine 1.23, hgb 9.9, TSH normal Labs (6/18): K 4.7, creatinine 1.09, BNP 94, hgb 13.8, TSH normal Labs (12/18): K 4.7, creatinine 0.98  PMH: 1. Chronic systolic CHF: Nonischemic cardiomyopathy.  LHC (9/14) with no significant coronary disease, EF 25-30% with global hypokinesis.  Echo (9/14) with EF 30-35%, moderate LV dilation, diffuse hypokinesis.  Echo (12/14) with EF 20-25%.  She does not drink ETOH.  She had Medtronic CRT-D device implantation in 1/15. Echo (5/15) with EF 40-45%, mild LVH.  CPX (5/15) was submaximal with RER 0.99, peak VO2 14, VE/VCO2 37.3; low normal functional capacity may be limited by loss of BiV pacing at higher HR and by deconditioning but interpretation somewhat limited since study was submaximal.  Echo (6/16) with EF 35-40%, severe LV dilation, diffuse hypokinesis, mild MR.  CPX (9/16) with peak VO2 17.7, VE/VCO2 slope 32.3, RER 1.11 => excellent functional capacity. Echo (11/17) with EF 60-65%.  - Echo (8/18): EF 50-55%, mildly dilated LV, mild MR.  2. Back pain s/p lumbar laminectomy  3. OA: Knees. Left TKR in 6/17.  4. Hyperlipidemia 5. Depression 6. PVCs 7. Peripheral neuropathy 8. Lung nodule  SH: Lives [place], married, never smoked, no ETOH.    FH: Mother with MI at 56, possible ischemic cardiomyopathy.  Father with cardiomyopathy, died at 71.  She has 5 brothers, none with significant coronary disease.    ROS: All systems reviewed and negative except as per HPI.   Current Outpatient Medications  Medication Sig Dispense Refill  . acetaminophen (TYLENOL) 650 MG CR tablet Take 650 mg by  mouth every 8 (eight) hours as needed for pain.    . Ascorbic Acid (VITAMIN C) 1000 MG tablet Take 1,000 mg by mouth daily.     . B Complex Vitamins (VITAMIN B COMPLEX PO) Take 1 tablet by mouth daily. Reported on 01/30/2015    . carvedilol (COREG) 6.25 MG tablet TAKE ONE TABLET BY MOUTH TWICE DAILY 180 tablet 3  . Coenzyme Q10 (COQ10) 100 MG CAPS Take 1 capsule by mouth daily as needed.     . DULoxetine (CYMBALTA) 30 MG capsule Take 30 mg by mouth daily.     . flecainide (TAMBOCOR) 100 MG tablet Take 0.5 tablets (50 mg total) by mouth 2 (two) times daily.    Marland Kitchen gabapentin (NEURONTIN) 100 MG capsule Take 100 mg by mouth at bedtime.    Marland Kitchen levothyroxine (SYNTHROID, LEVOTHROID) 50 MCG tablet Take 50 mcg by mouth daily.      . Magnesium Oxide (MAG-200 PO) Take 1 tablet by mouth 2 (two) times daily.     . multivitamin (THERAGRAN) per tablet Take 1 tablet by mouth daily.      . rosuvastatin (CRESTOR) 5 MG tablet Take 1 tablet by mouth as directed. Pt takes one tablet 2 times a week    . sacubitril-valsartan (ENTRESTO) 24-26 MG Take 1 tablet by mouth 2 (two) times daily. 180 tablet 3  . spironolactone (ALDACTONE) 25 MG tablet Take 25 mg by mouth daily.     No current facility-administered medications for this encounter.    Vitals:   02/01/17 0941  BP: 113/80  Pulse: 84  SpO2: 98%  Weight: 193 lb (87.5 kg)   General: NAD Neck: No JVD, no thyromegaly or thyroid nodule.  Lungs: Clear to auscultation bilaterally with normal respiratory effort. CV: Nondisplaced PMI.  Heart regular S1/S2, no S3/S4, no murmur.  No peripheral edema.  No carotid bruit.  Normal pedal pulses.  Abdomen: Soft, nontender, no hepatosplenomegaly, no distention.  Skin: Intact without lesions or rashes.  Neurologic: Alert and oriented x 3.  Psych: Normal affect. Extremities: No clubbing or cyanosis.  HEENT: Normal.   Assessment/Plan:  1. Chronic systolic CHF: Nonischemic cardiomyopathy s/p Medtronic CRT-D. EF 35-40%  (6/16). She does have a family history of cardiomyopathy of uncertain etiology (father) but none of her siblings developed a cardiomyopathy.  Familial cardiomyopathy is a consideration.  Viral myocarditis is also a possible etiology.  Echo in 1/17 showed improvement in EF to 60-65%, echo in 8/18 showed EF 50-55%.  Symptomatically seems to be doing better with PVC suppression (suspect this is helping her BiV pacing percentage, now >99%).  Volume status looks ok by exam and Optivol.  - Continue current Entresto, Coreg, and spironolactone. Recent BMET was stable.  2. Depression: Improved on Celexa.  3. PVCs: Very frequent until flecainide started.  - Continue flecainide 50 mg bid.  4. Lung nodule: Will order repeat noncontrast CT chest to follow.   Loralie Champagne 02/01/2017

## 2017-02-01 NOTE — Patient Instructions (Signed)
Non-Cardiac CT scanning, (CAT scanning), is a noninvasive, special x-ray that produces cross-sectional images of the body using x-rays and a computer. CT scans help physicians diagnose and treat medical conditions. For some CT exams, a contrast material is used to enhance visibility in the area of the body being studied. CT scans provide greater clarity and reveal more details than regular x-ray exams.  Your physician recommends that you schedule a follow-up appointment in: 6 months (June, 2019)  (we will call you)

## 2017-02-05 ENCOUNTER — Ambulatory Visit (HOSPITAL_COMMUNITY)
Admission: RE | Admit: 2017-02-05 | Discharge: 2017-02-05 | Disposition: A | Discharge status: Home / Self Care | Payer: Medicare HMO | Source: Ambulatory Visit | Attending: Cardiology | Admitting: Cardiology

## 2017-02-05 DIAGNOSIS — R918 Other nonspecific abnormal finding of lung field: Secondary | ICD-10-CM | POA: Insufficient documentation

## 2017-02-05 DIAGNOSIS — R911 Solitary pulmonary nodule: Secondary | ICD-10-CM | POA: Diagnosis not present

## 2017-02-09 ENCOUNTER — Telehealth (HOSPITAL_COMMUNITY): Payer: Self-pay | Admitting: Pharmacist

## 2017-02-09 NOTE — Telephone Encounter (Signed)
Enrolled Heather Smith in [facility] so that she will have $1000 to use toward her Delene Loll copay costs through 11/24/17. Patient aware and grateful for the assistance.   Member ID: [id number] Group ID: [id number] RxBin ID: [id number] PCN: [id number] Eligibility Start Date: 11/25/2016 Eligibility End Date: 11/24/2017 Assistance Amount: $1,000.00   Doroteo Bradford K. Velva Harman, PharmD, BCPS, CPP Clinical Pharmacist Phone: [phone] 02/09/2017 1:40 PM

## 2017-02-11 ENCOUNTER — Telehealth (HOSPITAL_COMMUNITY): Payer: Self-pay | Admitting: *Deleted

## 2017-02-11 NOTE — Telephone Encounter (Signed)
  Stacie Ac[place] B Preferred Name:  None Female, 84 y.o., September 22, 1932 Last Weight:  193 lb (87.5 kg) Weight:  193 lb (87.5 kg) Phone:  [phone] (Mobile) [phone] (Home Phone) PCP:  Shirline Frees, MD Language:  Cleophus Molt Need Interpreter:  None Allergies:  Statins, Niaspan [Niacin Er], Benadryl [Diphenhydramine], Dilaudid [Hydromorphone Hcl] Health Maintenance Due?:  Health Maintenance Active FYIs:  Designated Party Release , General Primary Ins.:  Bernadene Person MRN:  [id number] MyChart:  Active Next Appt:  02/18/2017 Message  Received: 4 days ago  Message Contents  Aundra Dubin, Elby Showers, MD  Shirley Muscat, RN  Cc: Adolm Joseph Q, CMA        Stable small nodules, no further followup needed.   Result Notes for CT Chest Wo Contrast   Notes recorded by Darron Doom, RN on 02/11/2017 at 12:13 PM EST Patient called back and she is aware of results. No further questions. ------  Notes recorded by Shirley Muscat, RN on 02/09/2017 at 4:14 PM EST Left VM  ------  Notes recorded by Larey Dresser, MD on 02/07/2017 at 9:03 PM EST Stable small nodules, no further followup needed.

## 2017-02-15 ENCOUNTER — Other Ambulatory Visit: Payer: Self-pay | Admitting: Cardiology

## 2017-02-18 ENCOUNTER — Ambulatory Visit (INDEPENDENT_AMBULATORY_CARE_PROVIDER_SITE_OTHER): Payer: Medicare HMO | Admitting: *Deleted

## 2017-02-18 DIAGNOSIS — I5022 Chronic systolic (congestive) heart failure: Secondary | ICD-10-CM

## 2017-02-18 DIAGNOSIS — I428 Other cardiomyopathies: Secondary | ICD-10-CM

## 2017-02-18 NOTE — Progress Notes (Signed)
Remote ICD transmission.   

## 2017-02-19 ENCOUNTER — Encounter: Payer: Self-pay | Admitting: Cardiology

## 2017-02-23 DIAGNOSIS — Z008 Encounter for other general examination: Secondary | ICD-10-CM | POA: Diagnosis not present

## 2017-02-23 DIAGNOSIS — L03116 Cellulitis of left lower limb: Secondary | ICD-10-CM | POA: Diagnosis not present

## 2017-02-23 DIAGNOSIS — L02416 Cutaneous abscess of left lower limb: Secondary | ICD-10-CM | POA: Diagnosis not present

## 2017-02-23 DIAGNOSIS — Z85828 Personal history of other malignant neoplasm of skin: Secondary | ICD-10-CM | POA: Diagnosis not present

## 2017-03-03 LAB — CUP PACEART REMOTE DEVICE CHECK
Battery Remaining Longevity: 16 mo
Battery Voltage: 2.9 V
Brady Statistic AP VS Percent: 0.67 %
Brady Statistic AS VS Percent: 0.86 %
Brady Statistic RA Percent Paced: 44.81 %
Brady Statistic RV Percent Paced: 98.1 %
HIGH POWER IMPEDANCE MEASURED VALUE: 80 Ohm
Implantable Lead Implant Date: 20150121
Implantable Lead Model: 4298
Lead Channel Impedance Value: 266 Ohm
Lead Channel Impedance Value: 513 Ohm
Lead Channel Impedance Value: 532 Ohm
Lead Channel Impedance Value: 532 Ohm
Lead Channel Impedance Value: 570 Ohm
Lead Channel Impedance Value: 589 Ohm
Lead Channel Impedance Value: 646 Ohm
Lead Channel Sensing Intrinsic Amplitude: 14.25 mV
Lead Channel Sensing Intrinsic Amplitude: 14.25 mV
Lead Channel Sensing Intrinsic Amplitude: 3.125 mV
Lead Channel Setting Pacing Amplitude: 2 V
Lead Channel Setting Pacing Amplitude: 2.75 V
Lead Channel Setting Pacing Pulse Width: 0.4 ms
Lead Channel Setting Pacing Pulse Width: 0.7 ms
Lead Channel Setting Sensing Sensitivity: 0.3 mV
MDC IDC LEAD IMPLANT DT: 20150121
MDC IDC LEAD IMPLANT DT: 20150121
MDC IDC LEAD LOCATION: 753858
MDC IDC LEAD LOCATION: 753859
MDC IDC LEAD LOCATION: 753860
MDC IDC MSMT LEADCHNL LV IMPEDANCE VALUE: 380 Ohm
MDC IDC MSMT LEADCHNL LV IMPEDANCE VALUE: 399 Ohm
MDC IDC MSMT LEADCHNL LV IMPEDANCE VALUE: 779 Ohm
MDC IDC MSMT LEADCHNL LV IMPEDANCE VALUE: 855 Ohm
MDC IDC MSMT LEADCHNL LV IMPEDANCE VALUE: 893 Ohm
MDC IDC MSMT LEADCHNL LV PACING THRESHOLD AMPLITUDE: 1.25 V
MDC IDC MSMT LEADCHNL LV PACING THRESHOLD PULSEWIDTH: 0.7 ms
MDC IDC MSMT LEADCHNL RA IMPEDANCE VALUE: 399 Ohm
MDC IDC MSMT LEADCHNL RA PACING THRESHOLD AMPLITUDE: 0.75 V
MDC IDC MSMT LEADCHNL RA PACING THRESHOLD PULSEWIDTH: 0.4 ms
MDC IDC MSMT LEADCHNL RA SENSING INTR AMPL: 3.125 mV
MDC IDC MSMT LEADCHNL RV PACING THRESHOLD AMPLITUDE: 0.5 V
MDC IDC MSMT LEADCHNL RV PACING THRESHOLD PULSEWIDTH: 0.4 ms
MDC IDC PG IMPLANT DT: 20150121
MDC IDC SESS DTM: 20190117081704
MDC IDC SET LEADCHNL RA PACING AMPLITUDE: 1.5 V
MDC IDC STAT BRADY AP VP PERCENT: 44.21 %
MDC IDC STAT BRADY AS VP PERCENT: 54.26 %

## 2017-04-26 DIAGNOSIS — I509 Heart failure, unspecified: Secondary | ICD-10-CM | POA: Diagnosis not present

## 2017-04-26 DIAGNOSIS — H547 Unspecified visual loss: Secondary | ICD-10-CM | POA: Diagnosis not present

## 2017-04-26 DIAGNOSIS — K219 Gastro-esophageal reflux disease without esophagitis: Secondary | ICD-10-CM | POA: Diagnosis not present

## 2017-04-26 DIAGNOSIS — K5900 Constipation, unspecified: Secondary | ICD-10-CM | POA: Diagnosis not present

## 2017-04-26 DIAGNOSIS — E669 Obesity, unspecified: Secondary | ICD-10-CM | POA: Diagnosis not present

## 2017-04-26 DIAGNOSIS — G629 Polyneuropathy, unspecified: Secondary | ICD-10-CM | POA: Diagnosis not present

## 2017-04-26 DIAGNOSIS — R69 Illness, unspecified: Secondary | ICD-10-CM | POA: Diagnosis not present

## 2017-04-26 DIAGNOSIS — H04129 Dry eye syndrome of unspecified lacrimal gland: Secondary | ICD-10-CM | POA: Diagnosis not present

## 2017-04-26 DIAGNOSIS — E039 Hypothyroidism, unspecified: Secondary | ICD-10-CM | POA: Diagnosis not present

## 2017-04-26 DIAGNOSIS — G8929 Other chronic pain: Secondary | ICD-10-CM | POA: Diagnosis not present

## 2017-04-27 ENCOUNTER — Other Ambulatory Visit (HOSPITAL_COMMUNITY): Payer: Self-pay | Admitting: Internal Medicine

## 2017-05-11 ENCOUNTER — Telehealth (HOSPITAL_COMMUNITY): Payer: Self-pay | Admitting: Pharmacist

## 2017-05-11 NOTE — Telephone Encounter (Signed)
Ms. Couillard called wanting to know if she could refill her Entresto. I have advised her that she has an active Rx with [facility] and still has >$700 in her PANF grant to use toward her copay costs so she should be able to fill it for no charge to her.  Ruta Hinds. Velva Harman, PharmD, BCPS, CPP Clinical Pharmacist Phone: [phone] 05/11/2017 2:33 PM

## 2017-05-20 ENCOUNTER — Ambulatory Visit (INDEPENDENT_AMBULATORY_CARE_PROVIDER_SITE_OTHER): Payer: Medicare HMO | Admitting: *Deleted

## 2017-05-20 ENCOUNTER — Telehealth: Payer: Self-pay | Admitting: Cardiology

## 2017-05-20 DIAGNOSIS — I5022 Chronic systolic (congestive) heart failure: Secondary | ICD-10-CM

## 2017-05-20 DIAGNOSIS — I428 Other cardiomyopathies: Secondary | ICD-10-CM | POA: Diagnosis not present

## 2017-05-20 NOTE — Telephone Encounter (Signed)
Spoke with pt and reminded pt of remote transmission that is due today. Pt verbalized understanding.   

## 2017-05-21 ENCOUNTER — Encounter: Payer: Self-pay | Admitting: Cardiology

## 2017-05-21 NOTE — Progress Notes (Signed)
Remote ICD transmission.   

## 2017-05-25 LAB — CUP PACEART REMOTE DEVICE CHECK
Battery Remaining Longevity: 12 mo
Battery Voltage: 2.89 V
Brady Statistic AP VP Percent: 39.13 %
Brady Statistic AS VS Percent: 0.92 %
Date Time Interrogation Session: 20190418073425
HIGH POWER IMPEDANCE MEASURED VALUE: 77 Ohm
Implantable Lead Implant Date: 20150121
Implantable Lead Location: 753859
Implantable Lead Model: 4298
Implantable Lead Model: 5076
Lead Channel Impedance Value: 380 Ohm
Lead Channel Impedance Value: 380 Ohm
Lead Channel Impedance Value: 494 Ohm
Lead Channel Impedance Value: 513 Ohm
Lead Channel Impedance Value: 513 Ohm
Lead Channel Impedance Value: 570 Ohm
Lead Channel Impedance Value: 779 Ohm
Lead Channel Pacing Threshold Amplitude: 0.5 V
Lead Channel Pacing Threshold Amplitude: 0.75 V
Lead Channel Pacing Threshold Pulse Width: 0.4 ms
Lead Channel Pacing Threshold Pulse Width: 0.7 ms
Lead Channel Sensing Intrinsic Amplitude: 12.5 mV
Lead Channel Setting Pacing Amplitude: 2 V
Lead Channel Setting Pacing Amplitude: 3 V
Lead Channel Setting Pacing Pulse Width: 0.7 ms
MDC IDC LEAD IMPLANT DT: 20150121
MDC IDC LEAD IMPLANT DT: 20150121
MDC IDC LEAD LOCATION: 753858
MDC IDC LEAD LOCATION: 753860
MDC IDC MSMT LEADCHNL LV IMPEDANCE VALUE: 304 Ohm
MDC IDC MSMT LEADCHNL LV IMPEDANCE VALUE: 570 Ohm
MDC IDC MSMT LEADCHNL LV IMPEDANCE VALUE: 703 Ohm
MDC IDC MSMT LEADCHNL LV IMPEDANCE VALUE: 779 Ohm
MDC IDC MSMT LEADCHNL LV PACING THRESHOLD AMPLITUDE: 1.875 V
MDC IDC MSMT LEADCHNL RA IMPEDANCE VALUE: 437 Ohm
MDC IDC MSMT LEADCHNL RA PACING THRESHOLD PULSEWIDTH: 0.4 ms
MDC IDC MSMT LEADCHNL RA SENSING INTR AMPL: 2.75 mV
MDC IDC MSMT LEADCHNL RA SENSING INTR AMPL: 2.75 mV
MDC IDC MSMT LEADCHNL RV IMPEDANCE VALUE: 589 Ohm
MDC IDC MSMT LEADCHNL RV SENSING INTR AMPL: 12.5 mV
MDC IDC PG IMPLANT DT: 20150121
MDC IDC SET LEADCHNL RA PACING AMPLITUDE: 1.5 V
MDC IDC SET LEADCHNL RV PACING PULSEWIDTH: 0.4 ms
MDC IDC SET LEADCHNL RV SENSING SENSITIVITY: 0.3 mV
MDC IDC STAT BRADY AP VS PERCENT: 0.6 %
MDC IDC STAT BRADY AS VP PERCENT: 59.35 %
MDC IDC STAT BRADY RA PERCENT PACED: 39.66 %
MDC IDC STAT BRADY RV PERCENT PACED: 98.11 %

## 2017-06-10 ENCOUNTER — Telehealth: Payer: Self-pay | Admitting: Internal Medicine

## 2017-06-10 NOTE — Telephone Encounter (Signed)
error 

## 2017-06-14 ENCOUNTER — Other Ambulatory Visit: Payer: Self-pay | Admitting: Neurology

## 2017-06-15 ENCOUNTER — Other Ambulatory Visit: Payer: Self-pay | Admitting: Neurology

## 2017-06-15 ENCOUNTER — Other Ambulatory Visit: Payer: Self-pay | Admitting: Internal Medicine

## 2017-06-29 DIAGNOSIS — I10 Essential (primary) hypertension: Secondary | ICD-10-CM | POA: Diagnosis not present

## 2017-06-29 DIAGNOSIS — Z95810 Presence of automatic (implantable) cardiac defibrillator: Secondary | ICD-10-CM | POA: Diagnosis not present

## 2017-06-29 DIAGNOSIS — R69 Illness, unspecified: Secondary | ICD-10-CM | POA: Diagnosis not present

## 2017-06-29 DIAGNOSIS — R7309 Other abnormal glucose: Secondary | ICD-10-CM | POA: Diagnosis not present

## 2017-06-29 DIAGNOSIS — N183 Chronic kidney disease, stage 3 (moderate): Secondary | ICD-10-CM | POA: Diagnosis not present

## 2017-06-29 DIAGNOSIS — G6289 Other specified polyneuropathies: Secondary | ICD-10-CM | POA: Diagnosis not present

## 2017-06-29 DIAGNOSIS — I5020 Unspecified systolic (congestive) heart failure: Secondary | ICD-10-CM | POA: Diagnosis not present

## 2017-07-09 ENCOUNTER — Other Ambulatory Visit (HOSPITAL_COMMUNITY): Payer: Self-pay | Admitting: *Deleted

## 2017-07-09 MED ORDER — SPIRONOLACTONE 25 MG PO TABS: 25.0000 mg | ORAL_TABLET | Freq: Every day | ORAL | 3 refills | Status: DC

## 2017-07-13 DIAGNOSIS — D485 Neoplasm of uncertain behavior of skin: Secondary | ICD-10-CM | POA: Diagnosis not present

## 2017-07-13 DIAGNOSIS — L723 Sebaceous cyst: Secondary | ICD-10-CM | POA: Diagnosis not present

## 2017-07-13 DIAGNOSIS — C44619 Basal cell carcinoma of skin of left upper limb, including shoulder: Secondary | ICD-10-CM | POA: Diagnosis not present

## 2017-07-13 DIAGNOSIS — L918 Other hypertrophic disorders of the skin: Secondary | ICD-10-CM | POA: Diagnosis not present

## 2017-07-13 DIAGNOSIS — D225 Melanocytic nevi of trunk: Secondary | ICD-10-CM | POA: Diagnosis not present

## 2017-07-13 DIAGNOSIS — L438 Other lichen planus: Secondary | ICD-10-CM | POA: Diagnosis not present

## 2017-07-13 DIAGNOSIS — L821 Other seborrheic keratosis: Secondary | ICD-10-CM | POA: Diagnosis not present

## 2017-07-13 DIAGNOSIS — D2271 Melanocytic nevi of right lower limb, including hip: Secondary | ICD-10-CM | POA: Diagnosis not present

## 2017-07-13 DIAGNOSIS — L810 Postinflammatory hyperpigmentation: Secondary | ICD-10-CM | POA: Diagnosis not present

## 2017-07-13 DIAGNOSIS — L814 Other melanin hyperpigmentation: Secondary | ICD-10-CM | POA: Diagnosis not present

## 2017-07-13 DIAGNOSIS — D171 Benign lipomatous neoplasm of skin and subcutaneous tissue of trunk: Secondary | ICD-10-CM | POA: Diagnosis not present

## 2017-07-13 DIAGNOSIS — Z85828 Personal history of other malignant neoplasm of skin: Secondary | ICD-10-CM | POA: Diagnosis not present

## 2017-08-04 DIAGNOSIS — S838X1A Sprain of other specified parts of right knee, initial encounter: Secondary | ICD-10-CM | POA: Diagnosis not present

## 2017-08-04 DIAGNOSIS — S93401A Sprain of unspecified ligament of right ankle, initial encounter: Secondary | ICD-10-CM | POA: Diagnosis not present

## 2017-08-04 DIAGNOSIS — S8001XA Contusion of right knee, initial encounter: Secondary | ICD-10-CM | POA: Diagnosis not present

## 2017-08-19 ENCOUNTER — Encounter: Payer: Medicare HMO | Admitting: *Deleted

## 2017-08-19 ENCOUNTER — Telehealth: Payer: Self-pay

## 2017-08-19 NOTE — Telephone Encounter (Signed)
Spoke with pt and reminded pt of remote transmission that is due today. Pt verbalized understanding.   

## 2017-08-20 ENCOUNTER — Encounter: Payer: Self-pay | Admitting: Cardiology

## 2017-08-23 DIAGNOSIS — S93491A Sprain of other ligament of right ankle, initial encounter: Secondary | ICD-10-CM | POA: Diagnosis not present

## 2017-08-23 DIAGNOSIS — M1711 Unilateral primary osteoarthritis, right knee: Secondary | ICD-10-CM | POA: Diagnosis not present

## 2017-09-02 ENCOUNTER — Encounter: Payer: Self-pay | Admitting: Nurse Practitioner

## 2017-09-02 ENCOUNTER — Ambulatory Visit: Payer: Medicare HMO | Admitting: Nurse Practitioner

## 2017-09-02 VITALS — BP 110/68 | HR 82 | Ht 68.0 in | Wt 184.4 lb

## 2017-09-02 DIAGNOSIS — I428 Other cardiomyopathies: Secondary | ICD-10-CM | POA: Diagnosis not present

## 2017-09-02 DIAGNOSIS — I493 Ventricular premature depolarization: Secondary | ICD-10-CM | POA: Diagnosis not present

## 2017-09-02 DIAGNOSIS — I5022 Chronic systolic (congestive) heart failure: Secondary | ICD-10-CM | POA: Diagnosis not present

## 2017-09-02 LAB — CUP PACEART INCLINIC DEVICE CHECK
Implantable Lead Implant Date: 20150121
Implantable Lead Location: 753859
Implantable Lead Location: 753860
Implantable Lead Model: 5076
MDC IDC LEAD IMPLANT DT: 20150121
MDC IDC LEAD IMPLANT DT: 20150121
MDC IDC LEAD LOCATION: 753858
MDC IDC PG IMPLANT DT: 20150121
MDC IDC SESS DTM: 20190801113526

## 2017-09-02 NOTE — Progress Notes (Signed)
Electrophysiology Office Note Date: 09/02/2017  ID:  TEYANA PIERRON, DOB 05-06-1932, MRN [id number]  PCP: Shirline Frees, MD Primary Cardiologist: Aundra Dubin Electrophysiologist: Caryl Comes  CC: Routine ICD follow-up  DEYRA PERDOMO is a 84 y.o. female seen today for Dr Caryl Comes.  She presents today for routine electrophysiology followup.  Since last being seen in our clinic, the patient reports doing relatively well.  She asked to come in today because the last 3 weeks she has had fatigue and some increase in shortness of breath. In hindsight, he symptoms started at the same time that she stopped her Cymbalta. She was worried about interaction with a muscle relaxant.  She denies chest pain, palpitations,  PND, orthopnea, nausea, vomiting, dizziness, syncope, edema, weight gain, or early satiety.  She has not had ICD shocks.   Device History: MDT CRTD implanted 2015 for CHF History of appropriate therapy: No History of AAD therapy: No   Past Medical History:  Diagnosis Date  . Chest pain    a. Nl cath in the 90's;  b. 04/2005: Myoview: subtle areao of ? reversibility in apical segment of anteromedial LV - ? attenuation, EF 54%;  c. 05/2012 MV: small, fixed mild apical septal perfusion defect, ? attenuation due to lbbb, no ischemia.  . Complication of anesthesia    difficult to wake up and blood pressure drops  . GERD (gastroesophageal reflux disease)   . Hypercholesterolemia   . Hypertension   . Hypothyroidism   . Left bundle branch block   . LV lead partial dislodgment 05/30/2013  . Neurogenic claudication due to lumbar spinal stenosis   . Non-ischemic cardiomyopathy ([place])    [place] 10/04/12: Normal coronary arteries, EF 25-30% with global HK.  Echocardiogram 10/05/12: EF 30-35%, diffuse HK, moderate LAE. Echocardiogram (01/2013): Lateral to septal wall dyssynchrony EF 20-25%, diffuse HK  . Osteoarthritis    "about all over" (02/22/2013)  . Spinal stenosis   . Vitamin D deficiency    Past  Surgical History:  Procedure Laterality Date  . APPENDECTOMY    . BI-VENTRICULAR IMPLANTABLE CARDIOVERTER DEFIBRILLATOR N/A 02/22/2013   Procedure: BI-VENTRICULAR IMPLANTABLE CARDIOVERTER DEFIBRILLATOR  (CRT-D);  Surgeon: Deboraha Sprang, MD;  Location: [facility] CATH LAB;  Service: Cardiovascular;  Laterality: N/A;  . BLEPHAROPLASTY Bilateral   . EXCISIONAL HEMORRHOIDECTOMY    . FOOT ARTHRODESIS, MODIFIED MCBRIDE Bilateral   . KNEE ARTHROSCOPY Right   . LEFT HEART CATHETERIZATION WITH CORONARY ANGIOGRAM N/A 10/04/2012   Procedure: LEFT HEART CATHETERIZATION WITH CORONARY ANGIOGRAM;  Surgeon: Peter M Martinique, MD;  Location[facility] CATH LAB;  Service: Cardiovascular;  Laterality: N/A;  . LUMBAR LAMINECTOMY  11/03/2006    Bilateral 3, 4, 5 laminectomy, partial L2, decompression of the thecal sac, foraminotomy, posterolateral arthrodesis L3-S1 with autograft   -- SURGEON:  Leeroy Cha, M.D.   . TONSILLECTOMY    . TOTAL KNEE ARTHROPLASTY Left 07/15/2015   Procedure: LEFT TOTAL KNEE ARTHROPLASTY;  Surgeon: Frederik Pear, MD;  Location: [place];  Service: Orthopedics;  Laterality: Left;  . TUBAL LIGATION    . VAGINAL HYSTERECTOMY      Current Outpatient Medications  Medication Sig Dispense Refill  . acetaminophen (TYLENOL) 650 MG CR tablet Take 650 mg by mouth every 8 (eight) hours as needed for pain.    . Ascorbic Acid (VITAMIN C) 1000 MG tablet Take 1,000 mg by mouth daily.     . B Complex Vitamins (VITAMIN B COMPLEX PO) Take 1 tablet by mouth daily. Reported on 01/30/2015    .  carvedilol (COREG) 6.25 MG tablet TAKE 1 TABLET BY MOUTH TWICE DAILY 180 tablet 3  . Coenzyme Q10 (COQ10) 100 MG CAPS Take 1 capsule by mouth daily as needed.     . DULoxetine (CYMBALTA) 30 MG capsule Take 30 mg by mouth daily.     . flecainide (TAMBOCOR) 100 MG tablet Take 0.5 tablets (50 mg total) by mouth 2 (two) times daily.    Marland Kitchen gabapentin (NEURONTIN) 100 MG capsule Take 100 mg by mouth at bedtime.    Marland Kitchen levothyroxine (SYNTHROID,  LEVOTHROID) 50 MCG tablet Take 50 mcg by mouth daily.      . Magnesium Oxide (MAG-200 PO) Take 1 tablet by mouth 2 (two) times daily.     . multivitamin (THERAGRAN) per tablet Take 1 tablet by mouth daily.      . rosuvastatin (CRESTOR) 5 MG tablet Take 1 tablet by mouth as directed. Pt takes one tablet 2 times a week    . sacubitril-valsartan (ENTRESTO) 24-26 MG Take 1 tablet by mouth 2 (two) times daily. 180 tablet 3  . spironolactone (ALDACTONE) 25 MG tablet Take 1 tablet (25 mg total) by mouth daily. 90 tablet 3  . tiZANidine (ZANAFLEX) 4 MG tablet Take 0.5 tablets by mouth as needed for muscle spasms.     No current facility-administered medications for this visit.     Allergies:   Statins; Niaspan [niacin er]; Benadryl [diphenhydramine]; and Dilaudid [hydromorphone hcl]   Social History: Social History   Socioeconomic History  . Marital status: Married    Spouse name: Not on file  . Number of children: 4  . Years of education: 74  . Highest education level: Not on file  Occupational History  . Not on file  Social Needs  . Financial resource strain: Not on file  . Food insecurity:    Worry: Not on file    Inability: Not on file  . Transportation needs:    Medical: Not on file    Non-medical: Not on file  Tobacco Use  . Smoking status: Never Smoker  . Smokeless tobacco: Never Used  Substance and Sexual Activity  . Alcohol use: No  . Drug use: No  . Sexual activity: Not Currently  Lifestyle  . Physical activity:    Days per week: Not on file    Minutes per session: Not on file  . Stress: Not on file  Relationships  . Social connections:    Talks on phone: Not on file    Gets together: Not on file    Attends religious service: Not on file    Active member of club or organization: Not on file    Attends meetings of clubs or organizations: Not on file    Relationship status: Not on file  . Intimate partner violence:    Fear of current or ex partner: Not on file     Emotionally abused: Not on file    Physically abused: Not on file    Forced sexual activity: Not on file  Other Topics Concern  . Not on file  Social History Narrative   Lives in [place], [place] with husband.  Active around the house.   Right-handed   Caffeine: 3-4 glasses tea per day    Family History: Family History  Problem Relation Age of Onset  . Aneurysm Father        died @ 9.  Marland Kitchen Heart attack Mother        died @ 44.  . Thyroid disease  Mother   . Lung cancer Mother   . Hyperlipidemia Mother   . Aneurysm Brother        has had an Aneurysm x3  . Colon cancer Brother   . Bone cancer Brother   . Lung cancer Brother   . Stomach cancer Brother   . Neuropathy Neg Hx     Review of Systems: All other systems reviewed and are otherwise negative except as noted above.   Physical Exam: VS:  BP 110/68   Pulse 82   Ht 5\' 8"  (1.727 m)   Wt 184 lb 6.4 oz (83.6 kg)   SpO2 94%   BMI 28.04 kg/m  , BMI Body mass index is 28.04 kg/m.  GEN- The patient is well appearing, alert and oriented x 3 today.   HEENT: normocephalic, atraumatic; sclera clear, conjunctiva pink; hearing intact; oropharynx clear; neck supple  Lungs- Clear to ausculation bilaterally, normal work of breathing.  No wheezes, rales, rhonchi Heart- Regular rate and rhythm (paced) GI- soft, non-tender, non-distended, bowel sounds present  Extremities- no clubbing, cyanosis, or edema  MS- no significant deformity or atrophy Skin- warm and dry, no rash or lesion; ICD pocket well healed Psych- euthymic mood, full affect Neuro- strength and sensation are intact  ICD interrogation- reviewed in detail today,  See PACEART report  EKG:  EKG is not ordered today.  Recent Labs: 01/19/2017: BUN 18; Creatinine, Ser 0.98; Potassium 4.7; Sodium 142   Wt Readings from Last 3 Encounters:  09/02/17 184 lb 6.4 oz (83.6 kg)  02/01/17 193 lb (87.5 kg)  01/19/17 189 lb 12.8 oz (86.1 kg)     Other studies  Reviewed: Additional studies/ records that were reviewed today include: Dr Caryl Comes and AHF notes   Assessment and Plan:  1.  Chronic systolic dysfunction euvolemic today Stable on an appropriate medical regimen EF normalized post CRT Normal ICD function See Pace Art report No changes today  2.  PVCs Suppressed with Flecainide EKG stable  3.  Fatigue I think this is related to stopping Cymbalta She will resume Will check labs for completeness sake If symptoms not improved in 1 week, will recheck echo  Current medicines are reviewed at length with the patient today.   The patient does not have concerns regarding her medicines.  The following changes were made today:  none  Labs/ tests ordered today include:  Orders Placed This Encounter  Procedures  . CBC  . TSH  . Basic metabolic panel  . CUP PACEART INCLINIC DEVICE CHECK     Disposition:   Follow up with [facility], Dr Caryl Comes as scheduled   Signed, Chanetta Marshall, NP 09/02/2017 12:26 PM  [facility] [address] [place] [address] [phone] (office) [phone] (fax)

## 2017-09-02 NOTE — Patient Instructions (Addendum)
Medication Instructions:   Your physician recommends that you continue on your current medications as directed. Please refer to the Current Medication list given to you today.    If you need a refill on your cardiac medications before your next appointment, please call your pharmacy.  Labwork: CBC BMET  AND TSH     Testing/Procedures: NONE ORDERED  TODAY    Follow-Up: WITH [facility] IN [place]   Remote monitoring is used to monitor your Pacemaker of ICD from home. This monitoring reduces the number of office visits required to check your device to one time per year. It allows [place] to keep an eye on the functioning of your device to ensure it is working properly. You are scheduled for a device check from home on . 12-02-17 You may send your transmission at any time that day. If you have a wireless device, the transmission will be sent automatically. After your physician reviews your transmission, you will receive a postcard with your next transmission date.     Any Other Special Instructions Will Be Listed Below (If Applicable).

## 2017-09-03 LAB — BASIC METABOLIC PANEL
BUN / CREAT RATIO: 22 (ref 12–28)
BUN: 25 mg/dL (ref 8–27)
CHLORIDE: 102 mmol/L (ref 96–106)
CO2: 23 mmol/L (ref 20–29)
Calcium: 9.7 mg/dL (ref 8.7–10.3)
Creatinine, Ser: 1.14 mg/dL — ABNORMAL HIGH (ref 0.57–1.00)
GFR, EST AFRICAN AMERICAN: 51 mL/min/{1.73_m2} — AB (ref >59)
GFR, EST NON AFRICAN AMERICAN: 45 mL/min/{1.73_m2} — AB (ref >59)
Glucose: 105 mg/dL — ABNORMAL HIGH (ref 65–99)
POTASSIUM: 4.8 mmol/L (ref 3.5–5.2)
SODIUM: 140 mmol/L (ref 134–144)

## 2017-09-03 LAB — CBC
Hematocrit: 41.7 % (ref 34.0–46.6)
Hemoglobin: 13.9 g/dL (ref 11.1–15.9)
MCH: 31.3 pg (ref 26.6–33.0)
MCHC: 33.3 g/dL (ref 31.5–35.7)
MCV: 94 fL (ref 79–97)
PLATELETS: 179 10*3/uL (ref 150–450)
RBC: 4.44 x10E6/uL (ref 3.77–5.28)
RDW: 14.5 % (ref 12.3–15.4)
WBC: 6 10*3/uL (ref 3.4–10.8)

## 2017-09-03 LAB — TSH: TSH: 1.6 u[IU]/mL (ref 0.450–4.500)

## 2017-09-06 ENCOUNTER — Other Ambulatory Visit: Payer: Self-pay | Admitting: Internal Medicine

## 2017-09-20 DIAGNOSIS — H2513 Age-related nuclear cataract, bilateral: Secondary | ICD-10-CM | POA: Diagnosis not present

## 2017-09-20 DIAGNOSIS — H02403 Unspecified ptosis of bilateral eyelids: Secondary | ICD-10-CM | POA: Diagnosis not present

## 2017-09-20 DIAGNOSIS — H04123 Dry eye syndrome of bilateral lacrimal glands: Secondary | ICD-10-CM | POA: Diagnosis not present

## 2017-10-08 ENCOUNTER — Other Ambulatory Visit (HOSPITAL_COMMUNITY): Payer: Self-pay | Admitting: Cardiology

## 2017-10-28 ENCOUNTER — Other Ambulatory Visit (HOSPITAL_COMMUNITY): Payer: Self-pay | Admitting: Cardiology

## 2017-11-02 ENCOUNTER — Other Ambulatory Visit (HOSPITAL_COMMUNITY): Payer: Self-pay | Admitting: Cardiology

## 2017-11-02 DIAGNOSIS — Z85828 Personal history of other malignant neoplasm of skin: Secondary | ICD-10-CM | POA: Diagnosis not present

## 2017-11-02 DIAGNOSIS — C44619 Basal cell carcinoma of skin of left upper limb, including shoulder: Secondary | ICD-10-CM | POA: Diagnosis not present

## 2017-11-03 ENCOUNTER — Other Ambulatory Visit (HOSPITAL_COMMUNITY): Payer: Self-pay

## 2017-11-03 MED ORDER — CARVEDILOL 6.25 MG PO TABS: 6.2500 mg | ORAL_TABLET | Freq: Two times a day (BID) | ORAL | 0 refills | Status: DC

## 2017-11-08 ENCOUNTER — Encounter: Payer: Self-pay | Admitting: Internal Medicine

## 2017-11-22 ENCOUNTER — Ambulatory Visit (HOSPITAL_COMMUNITY)
Admission: RE | Admit: 2017-11-22 | Discharge: 2017-11-22 | Disposition: A | Discharge status: Home / Self Care | Payer: Medicare HMO | Source: Ambulatory Visit | Attending: Internal Medicine | Admitting: Internal Medicine

## 2017-11-22 ENCOUNTER — Encounter (HOSPITAL_COMMUNITY): Payer: Self-pay

## 2017-11-22 VITALS — BP 138/80 | HR 79 | Wt 188.4 lb

## 2017-11-22 DIAGNOSIS — R911 Solitary pulmonary nodule: Secondary | ICD-10-CM | POA: Diagnosis not present

## 2017-11-22 DIAGNOSIS — Z79899 Other long term (current) drug therapy: Secondary | ICD-10-CM | POA: Diagnosis not present

## 2017-11-22 DIAGNOSIS — R0789 Other chest pain: Secondary | ICD-10-CM | POA: Insufficient documentation

## 2017-11-22 DIAGNOSIS — F329 Major depressive disorder, single episode, unspecified: Secondary | ICD-10-CM | POA: Diagnosis not present

## 2017-11-22 DIAGNOSIS — I428 Other cardiomyopathies: Secondary | ICD-10-CM | POA: Diagnosis not present

## 2017-11-22 DIAGNOSIS — I493 Ventricular premature depolarization: Secondary | ICD-10-CM | POA: Diagnosis not present

## 2017-11-22 DIAGNOSIS — I5022 Chronic systolic (congestive) heart failure: Secondary | ICD-10-CM | POA: Diagnosis not present

## 2017-11-22 DIAGNOSIS — R079 Chest pain, unspecified: Secondary | ICD-10-CM | POA: Diagnosis not present

## 2017-11-22 DIAGNOSIS — E785 Hyperlipidemia, unspecified: Secondary | ICD-10-CM | POA: Insufficient documentation

## 2017-11-22 DIAGNOSIS — R69 Illness, unspecified: Secondary | ICD-10-CM | POA: Diagnosis not present

## 2017-11-22 LAB — BASIC METABOLIC PANEL
Anion gap: 7 (ref 5–15)
BUN: 16 mg/dL (ref 8–23)
CALCIUM: 9.5 mg/dL (ref 8.9–10.3)
CHLORIDE: 107 mmol/L (ref 98–111)
CO2: 26 mmol/L (ref 22–32)
CREATININE: 1.11 mg/dL — AB (ref 0.44–1.00)
GFR, EST AFRICAN AMERICAN: 52 mL/min — AB (ref >60)
GFR, EST NON AFRICAN AMERICAN: 45 mL/min — AB (ref >60)
Glucose, Bld: 94 mg/dL (ref 70–99)
Potassium: 4.8 mmol/L (ref 3.5–5.1)
SODIUM: 140 mmol/L (ref 135–145)

## 2017-11-22 LAB — CBC
HEMATOCRIT: 41 % (ref 36.0–46.0)
Hemoglobin: 12.9 g/dL (ref 12.0–15.0)
MCH: 29.5 pg (ref 26.0–34.0)
MCHC: 31.5 g/dL (ref 30.0–36.0)
MCV: 93.6 fL (ref 80.0–100.0)
NRBC: 0 % (ref 0.0–0.2)
Platelets: 187 10*3/uL (ref 150–400)
RBC: 4.38 MIL/uL (ref 3.87–5.11)
RDW: 13.2 % (ref 11.5–15.5)
WBC: 5.2 10*3/uL (ref 4.0–10.5)

## 2017-11-22 LAB — BRAIN NATRIURETIC PEPTIDE: B NATRIURETIC PEPTIDE 5: 25.2 pg/mL (ref 0.0–100.0)

## 2017-11-22 LAB — TSH: TSH: 1.385 u[IU]/mL (ref 0.350–4.500)

## 2017-11-22 MED ORDER — FUROSEMIDE 20 MG PO TABS: 20.0000 mg | ORAL_TABLET | ORAL | 11 refills | Status: DC | PRN

## 2017-11-22 NOTE — Patient Instructions (Signed)
START Lasix 20 mg, one tab as needed for swelling or weight gain  (3 lbs over night or 5 lbs in a week)  However, DO NOT take more than twice a week   Labs today We will only contact you if something comes back abnormal or we need to make some changes. Otherwise no news is good news!   Your physician has requested that you have a lexiscan myoview. For further information please visit [email or web address]. Please follow instruction sheet, as given.  Your physician recommends that you schedule a follow-up appointment in: 3 months with Dr Aundra Dubin   Do the following things EVERYDAY: 1) Weigh yourself in the morning before breakfast. Write it down and keep it in a log. 2) Take your medicines as prescribed 3) Eat low salt foods-Limit salt (sodium) to 2000 mg per day.  4) Stay as active as you can everyday 5) Limit all fluids for the day to less than 2 liters

## 2017-11-22 NOTE — Progress Notes (Signed)
Patient ID: Heather Smith, female   DOB: [birth year], 84 y.o.   MRN: [id number] PCP: Dr. Kenton Kingfisher HF Cardiology: Aundra Dubin  84 yo with history of nonischemic cardiomyopathy and chronic systolic HF.  In 9/14, she was admitted to [facility] with chest pain.  Cardiac cath was done, showing no significant coronary disease.  However, echo showed EF 30-35%.  She was started on meds for CHF, and echo was repeated in 12/14, EF was read as 20-25%.  She had a Medtonic CRT-D device placed (had baseline LBBB).  In 3/15, the LV lead was not capturing properly so device had to be reprogrammed.  She had AV optimization in 5/15.  Limited echo at that time showed improvement in EF to 40-45%.  CPX in 5/15 was submaximal but suggested low normal to mildly reduced functional capacity. Last echo in 6/16 showed EF 35-40% with diffuse hypokinesis.  CPX was done in 9/16.  This actually showed good functional capacity. She had repeat echo in 1/17, showing EF increased to 60-65%.  Most recent echo in 8/18 showed EF 50-55% with mild LV dilation and mild MR.    She was seen by Dr. Caryl Comes for frequent PVCs (symptomatic).  She was started on flecainide.  She thinks that this has helped .   She returns for HF follow up. Complaining of fatigue and having some chest discomfort. Having chest discomfort at rest and with exertion. Mild dyspnea with exertion and having some edema in his lower extremity. Denies PND/Orthopnea. Appetite ok. No fever or chills. Weight at home has been stable. Taking all medications.  Medtronic device interrogation: No VT. No A fib. Activity ~1 hour per day. BI V pacing 98% Labs (9/14): TSH normal Labs (3/15): K 4.7, creatinine 1.1 Labs (5/15): K 4.9, creatinine 1.1, BNP 44 Labs (6/15): K 5.1, creatinine 1.4, pro-BNP 39 Labs (6/15): K 5.3, creatinine 1.53 Labs (12/15): K 4.4, creatinine 1.03 Labs (5/16): K 4, creatinine 1.0, BNP 34.6 Labs (6/16): K 4.3, creatinine 1.1 Labs (9/16): K 4.4, creatinine 1.16 Labs  (11/16): K 4.5, creatinine 1.16 Labs (12/16): K 4.6, creatinine 1.16, BNP 22, Mg 1.9 Labs (1/17): K 4.3, creatinine 1.34, HCT 42.4, ESR normal Labs (6/17): K 4.6, creatinine 1.23, hgb 9.9, TSH normal Labs (6/18): K 4.7, creatinine 1.09, BNP 94, hgb 13.8, TSH normal Labs (12/18): K 4.7, creatinine 0.98  PMH: 1. Chronic systolic CHF: Nonischemic cardiomyopathy.  LHC (9/14) with no significant coronary disease, EF 25-30% with global hypokinesis.  Echo (9/14) with EF 30-35%, moderate LV dilation, diffuse hypokinesis.  Echo (12/14) with EF 20-25%.  She does not drink ETOH.  She had Medtronic CRT-D device implantation in 1/15. Echo (5/15) with EF 40-45%, mild LVH.  CPX (5/15) was submaximal with RER 0.99, peak VO2 14, VE/VCO2 37.3; low normal functional capacity may be limited by loss of BiV pacing at higher HR and by deconditioning but interpretation somewhat limited since study was submaximal.  Echo (6/16) with EF 35-40%, severe LV dilation, diffuse hypokinesis, mild MR.  CPX (9/16) with peak VO2 17.7, VE/VCO2 slope 32.3, RER 1.11 => excellent functional capacity. Echo (11/17) with EF 60-65%.  - Echo (8/18): EF 50-55%, mildly dilated LV, mild MR.  2. Back pain s/p lumbar laminectomy 3. OA: Knees. Left TKR in 6/17.  4. Hyperlipidemia 5. Depression 6. PVCs 7. Peripheral neuropathy 8. Lung nodule  SH: Lives [place], married, never smoked, no ETOH.    FH: Mother with MI at 80, possible ischemic cardiomyopathy.  Father with cardiomyopathy,  died at 66.  She has 5 brothers, none with significant coronary disease.    ROS: All systems reviewed and negative except as per HPI.   Current Outpatient Medications  Medication Sig Dispense Refill  . acetaminophen (TYLENOL) 650 MG CR tablet Take 650 mg by mouth every 8 (eight) hours as needed for pain.    . Ascorbic Acid (VITAMIN C) 1000 MG tablet Take 1,000 mg by mouth daily.     . B Complex Vitamins (VITAMIN B COMPLEX PO) Take 1 tablet by mouth  daily. Reported on 01/30/2015    . carvedilol (COREG) 6.25 MG tablet Take 1 tablet (6.25 mg total) by mouth 2 (two) times daily. 180 tablet 0  . Coenzyme Q10 (COQ10) 100 MG CAPS Take 1 capsule by mouth daily as needed.     . DULoxetine (CYMBALTA) 30 MG capsule Take 30 mg by mouth daily.     Marland Kitchen ENTRESTO 24-26 MG TAKE 1 TABLET BY MOUTH TWICE DAILY 60 tablet 3  . flecainide (TAMBOCOR) 100 MG tablet Take 0.5 tablets (50 mg total) by mouth 2 (two) times daily. 90 tablet 3  . gabapentin (NEURONTIN) 100 MG capsule Take 100 mg by mouth at bedtime.    Marland Kitchen levothyroxine (SYNTHROID, LEVOTHROID) 50 MCG tablet Take 50 mcg by mouth daily.      . Magnesium Oxide (MAG-200 PO) Take 1 tablet by mouth 2 (two) times daily.     . multivitamin (THERAGRAN) per tablet Take 1 tablet by mouth daily.      . rosuvastatin (CRESTOR) 5 MG tablet Take 1 tablet by mouth as directed. Pt takes one tablet 2 times a week    . spironolactone (ALDACTONE) 25 MG tablet Take 1 tablet (25 mg total) by mouth daily. 90 tablet 3  . tiZANidine (ZANAFLEX) 4 MG tablet Take 0.5 tablets by mouth as needed for muscle spasms.     No current facility-administered medications for this encounter.    Vitals:   11/22/17 1329  BP: 138/80  Pulse: 79  SpO2: 97%  Weight: 85.5 kg (188 lb 6.4 oz)   Wt Readings from Last 3 Encounters:  11/22/17 85.5 kg (188 lb 6.4 oz)  09/02/17 83.6 kg (184 lb 6.4 oz)  02/01/17 87.5 kg (193 lb)   General:  Well appearing. No resp difficulty HEENT: normal Neck: supple. no JVD. Carotids 2+ bilat; no bruits. No lymphadenopathy or thryomegaly appreciated. Cor: PMI nondisplaced. Regular rate & rhythm. No rubs, gallops or murmurs. Lungs: clear Abdomen: soft, nontender, nondistended. No hepatosplenomegaly. No bruits or masses. Good bowel sounds. Extremities: no cyanosis, clubbing, rash, trace lower extremity edema Neuro: alert & orientedx3, cranial nerves grossly intact. moves all 4 extremities w/o difficulty. Affect  pleasant   Assessment/Plan:  1. Chronic systolic CHF: Nonischemic cardiomyopathy s/p Medtronic CRT-D. EF 35-40% (6/16). She does have a family history of cardiomyopathy of uncertain etiology (father) but none of her siblings developed a cardiomyopathy.  Familial cardiomyopathy is a consideration.  Viral myocarditis is also a possible etiology.  Echo in 1/17 showed improvement in EF to 60-65%, echo in 8/18 showed EF 50-55%.  Bi V Pacing 98%  NYHA II. Volume status mildly elevated. Add 20 mg po lasix as needed for lower extremity edema. No more than 2 times a week.   - Continue current Entresto, Coreg, and spironolactone. .  2. Depression: Improved on Celexa.  3. PVCs: Followed closely by EP.  - Continue flecainide 50 mg bid.  4. Lung nodule: Will order repeat noncontrast CT chest  to follow.  5. Chest Tightness Set up for myoview  Follow up in 3 months or sooner if myoview abnormal. I personally reviewed device interrogation with Medtronic. Check CBC, TSH, BMET, and BNP today   Heather Smith NP_C 11/22/2017

## 2017-11-30 ENCOUNTER — Other Ambulatory Visit (HOSPITAL_COMMUNITY): Payer: Self-pay | Admitting: Cardiology

## 2017-12-02 ENCOUNTER — Encounter: Payer: Medicare HMO | Admitting: *Deleted

## 2017-12-02 ENCOUNTER — Telehealth: Payer: Self-pay

## 2017-12-02 NOTE — Telephone Encounter (Signed)
Spoke with pt and reminded pt of remote transmission that is due today. Pt verbalized understanding.   

## 2017-12-03 ENCOUNTER — Encounter: Payer: Self-pay | Admitting: Cardiology

## 2017-12-06 ENCOUNTER — Telehealth (HOSPITAL_COMMUNITY): Payer: Self-pay | Admitting: *Deleted

## 2017-12-06 NOTE — Telephone Encounter (Signed)
Patient given detailed instructions per Myocardial Perfusion Study Information Sheet for the test on 12/08/17 at 0800. Patient notified to arrive 15 minutes early and that it is imperative to arrive on time for appointment to keep from having the test rescheduled.  If you need to cancel or reschedule your appointment, please call the office within 24 hours of your appointment. . Patient verbalized understanding.Selita Staiger, Ranae Palms

## 2017-12-08 ENCOUNTER — Ambulatory Visit (HOSPITAL_COMMUNITY): Payer: Medicare HMO | Attending: Cardiology

## 2017-12-08 DIAGNOSIS — R079 Chest pain, unspecified: Secondary | ICD-10-CM

## 2017-12-08 LAB — MYOCARDIAL PERFUSION IMAGING
CHL CUP NUCLEAR SDS: 1
CHL CUP NUCLEAR SSS: 1
CHL CUP RESTING HR STRESS: 66 {beats}/min
LVDIAVOL: 80 mL (ref 46–106)
LVSYSVOL: 21 mL
NUC STRESS TID: 0.86
Peak HR: 69 {beats}/min
SRS: 0

## 2017-12-08 MED ORDER — TECHNETIUM TC 99M TETROFOSMIN IV KIT
32.8000 | PACK | Freq: Once | INTRAVENOUS | Status: AC | PRN
  Administered 2017-12-08: 32.8 via INTRAVENOUS
  Filled 2017-12-08: qty 33

## 2017-12-08 MED ORDER — TECHNETIUM TC 99M TETROFOSMIN IV KIT
10.3000 | PACK | Freq: Once | INTRAVENOUS | Status: AC | PRN
  Administered 2017-12-08: 10.3 via INTRAVENOUS
  Filled 2017-12-08: qty 11

## 2017-12-08 MED ORDER — REGADENOSON 0.4 MG/5ML IV SOLN
0.4000 mg | Freq: Once | INTRAVENOUS | Status: AC
  Administered 2017-12-08: 0.4 mg via INTRAVENOUS

## 2017-12-21 DIAGNOSIS — G6289 Other specified polyneuropathies: Secondary | ICD-10-CM | POA: Diagnosis not present

## 2017-12-21 DIAGNOSIS — K219 Gastro-esophageal reflux disease without esophagitis: Secondary | ICD-10-CM | POA: Diagnosis not present

## 2017-12-21 DIAGNOSIS — N183 Chronic kidney disease, stage 3 (moderate): Secondary | ICD-10-CM | POA: Diagnosis not present

## 2017-12-21 DIAGNOSIS — R69 Illness, unspecified: Secondary | ICD-10-CM | POA: Diagnosis not present

## 2017-12-21 DIAGNOSIS — R7303 Prediabetes: Secondary | ICD-10-CM | POA: Diagnosis not present

## 2017-12-21 DIAGNOSIS — I10 Essential (primary) hypertension: Secondary | ICD-10-CM | POA: Diagnosis not present

## 2017-12-21 DIAGNOSIS — E039 Hypothyroidism, unspecified: Secondary | ICD-10-CM | POA: Diagnosis not present

## 2017-12-21 DIAGNOSIS — E782 Mixed hyperlipidemia: Secondary | ICD-10-CM | POA: Diagnosis not present

## 2018-01-05 ENCOUNTER — Ambulatory Visit: Payer: Medicare HMO | Admitting: Podiatry

## 2018-01-05 ENCOUNTER — Encounter: Payer: Self-pay | Admitting: Podiatry

## 2018-01-05 ENCOUNTER — Ambulatory Visit (INDEPENDENT_AMBULATORY_CARE_PROVIDER_SITE_OTHER): Payer: Medicare HMO

## 2018-01-05 ENCOUNTER — Other Ambulatory Visit: Payer: Self-pay | Admitting: Podiatry

## 2018-01-05 DIAGNOSIS — M79672 Pain in left foot: Principal | ICD-10-CM

## 2018-01-05 DIAGNOSIS — G629 Polyneuropathy, unspecified: Secondary | ICD-10-CM | POA: Diagnosis not present

## 2018-01-05 DIAGNOSIS — M79671 Pain in right foot: Secondary | ICD-10-CM

## 2018-01-05 DIAGNOSIS — M779 Enthesopathy, unspecified: Secondary | ICD-10-CM

## 2018-01-05 MED ORDER — TRIAMCINOLONE ACETONIDE 10 MG/ML IJ SUSP: 10.0000 mg | Freq: Once | INTRAMUSCULAR | Status: DC

## 2018-01-05 MED ORDER — TRIAMCINOLONE ACETONIDE 10 MG/ML IJ SUSP
10.0000 mg | Freq: Once | INTRAMUSCULAR | Status: AC
  Administered 2018-01-05: 10 mg

## 2018-01-05 NOTE — Progress Notes (Signed)
Subjective:   Patient ID: Heather Smith, female   DOB: 84 y.o.   MRN: [id number]   HPI Patient states she developed a lot of pain on top of her right foot and on the side of her right ankle after having an injury 7 months ago.  Patient states that she also has neuropathy and is continuing to take gabapentin and has questions concerning gabapentin   ROS      Objective:  Physical Exam  Neurovascular status intact with exquisite discomfort in the sinus tarsi right and also quite a bit of discomfort in the extensor tendon complex right with inflammation noted.  Does have moderate neuropathic symptoms bilateral and no Achilles tendon pain currently     Assessment:  Sinus tarsitis right with inflammation and extensor tendon inflammation right with pain along with neuropathic-like pain is been chronic in nature     Plan:  H&P x-rays reviewed and today I injected the sinus tarsi right 3 mg Kenalog 5 mg Xylocaine and I went ahead and I injected the tendon complex right 3 mg Kenalog 5 mg Xylocaine.  Discussed neuropathy and we will continue gabapentin 300 mg and may increase the dose and did not do any type of bone treatment for the left but may be necessary in future  X-rays indicate no signs of stress fracture or advanced arthritis.  Moderate depression of the arch and bone spur formation noted

## 2018-01-07 ENCOUNTER — Ambulatory Visit: Payer: Medicare HMO | Admitting: Internal Medicine

## 2018-01-07 ENCOUNTER — Encounter: Payer: Medicare HMO | Admitting: Internal Medicine

## 2018-01-07 ENCOUNTER — Encounter: Payer: Self-pay | Admitting: Internal Medicine

## 2018-01-07 VITALS — BP 124/80 | HR 71 | Ht 68.0 in | Wt 191.4 lb

## 2018-01-07 DIAGNOSIS — Z95810 Presence of automatic (implantable) cardiac defibrillator: Secondary | ICD-10-CM

## 2018-01-07 DIAGNOSIS — I428 Other cardiomyopathies: Secondary | ICD-10-CM | POA: Diagnosis not present

## 2018-01-07 DIAGNOSIS — I5032 Chronic diastolic (congestive) heart failure: Secondary | ICD-10-CM | POA: Diagnosis not present

## 2018-01-07 DIAGNOSIS — I493 Ventricular premature depolarization: Secondary | ICD-10-CM | POA: Diagnosis not present

## 2018-01-07 DIAGNOSIS — I480 Paroxysmal atrial fibrillation: Secondary | ICD-10-CM

## 2018-01-07 LAB — CUP PACEART INCLINIC DEVICE CHECK
Battery Remaining Longevity: 5 mo
Battery Voltage: 2.82 V
Brady Statistic AP VP Percent: 42.16 %
Brady Statistic AP VS Percent: 0.62 %
Brady Statistic AS VS Percent: 0.89 %
Brady Statistic RA Percent Paced: 42.73 %
Brady Statistic RV Percent Paced: 98.17 %
Date Time Interrogation Session: 20191206124956
HighPow Impedance: 69 Ohm
Implantable Lead Implant Date: 20150121
Implantable Lead Implant Date: 20150121
Implantable Lead Implant Date: 20150121
Implantable Lead Location: 753858
Implantable Lead Location: 753859
Implantable Lead Location: 753860
Implantable Lead Model: 4298
Implantable Lead Model: 5076
Implantable Pulse Generator Implant Date: 20150121
Lead Channel Impedance Value: 304 Ohm
Lead Channel Impedance Value: 437 Ohm
Lead Channel Impedance Value: 437 Ohm
Lead Channel Impedance Value: 456 Ohm
Lead Channel Impedance Value: 513 Ohm
Lead Channel Impedance Value: 532 Ohm
Lead Channel Impedance Value: 570 Ohm
Lead Channel Impedance Value: 570 Ohm
Lead Channel Impedance Value: 589 Ohm
Lead Channel Impedance Value: 646 Ohm
Lead Channel Impedance Value: 703 Ohm
Lead Channel Impedance Value: 817 Ohm
Lead Channel Pacing Threshold Amplitude: 0.5 V
Lead Channel Pacing Threshold Amplitude: 0.75 V
Lead Channel Pacing Threshold Amplitude: 1.75 V
Lead Channel Pacing Threshold Pulse Width: 0.4 ms
Lead Channel Pacing Threshold Pulse Width: 0.4 ms
Lead Channel Pacing Threshold Pulse Width: 0.7 ms
Lead Channel Sensing Intrinsic Amplitude: 15.125 mV
Lead Channel Sensing Intrinsic Amplitude: 3 mV
Lead Channel Setting Pacing Amplitude: 1.75 V
Lead Channel Setting Pacing Amplitude: 2 V
Lead Channel Setting Pacing Amplitude: 3 V
Lead Channel Setting Pacing Pulse Width: 0.4 ms
Lead Channel Setting Pacing Pulse Width: 0.7 ms
Lead Channel Setting Sensing Sensitivity: 0.3 mV
MDC IDC MSMT LEADCHNL LV IMPEDANCE VALUE: 817 Ohm
MDC IDC STAT BRADY AS VP PERCENT: 56.32 %

## 2018-01-07 NOTE — Progress Notes (Signed)
/      Patient Care Team: Shirline Frees, MD as PCP - General   HPI  Heather Smith is a 84 y.o. female Seen in followup for CRT implantation for nonischemic cardiomyopathy and depressed LV function. This was accomplished fall 2014  Interval partial lead dislodgment noted after she saw Dr. Mariana Single in March and an x-ray was obtained and reviewed. This  x-ray was reviewed. We reprogrammed the device    I reviewed with her the data from her CPX 9/16 showed excellent functional capacity and her echo from last week demonstrated normal LV function  DATE TEST EF   12/14 Echo  20-25%   1/17    Echo   55-62 %   1/18    Echo  55-60 %   11/19 MYOVIEW 75% NO ischemia     No more palps  No SOB edema or chest pain   Date Cr K  8//18 1.07 4.8  10/19  1.11 4.8   DATE QRSduration Flecainide  7/18 144    12/18 168 50  12/19 174 50      Past Medical History:  Diagnosis Date  . Chest pain    a. Nl cath in the 90's;  b. 04/2005: Myoview: subtle areao of ? reversibility in apical segment of anteromedial LV - ? attenuation, EF 54%;  c. 05/2012 MV: small, fixed mild apical septal perfusion defect, ? attenuation due to lbbb, no ischemia.  . Complication of anesthesia    difficult to wake up and blood pressure drops  . GERD (gastroesophageal reflux disease)   . Hypercholesterolemia   . Hypertension   . Hypothyroidism   . Left bundle branch block   . LV lead partial dislodgment 05/30/2013  . Neurogenic claudication due to lumbar spinal stenosis   . Non-ischemic cardiomyopathy ([place])    [place] 10/04/12: Normal coronary arteries, EF 25-30% with global HK.  Echocardiogram 10/05/12: EF 30-35%, diffuse HK, moderate LAE. Echocardiogram (01/2013): Lateral to septal wall dyssynchrony EF 20-25%, diffuse HK  . Osteoarthritis    "about all over" (02/22/2013)  . Spinal stenosis   . Vitamin D deficiency     Past Surgical History:  Procedure Laterality Date  . APPENDECTOMY    . BI-VENTRICULAR IMPLANTABLE  CARDIOVERTER DEFIBRILLATOR N/A 02/22/2013   Procedure: BI-VENTRICULAR IMPLANTABLE CARDIOVERTER DEFIBRILLATOR  (CRT-D);  Surgeon: Deboraha Sprang, MD;  Location: [facility] CATH LAB;  Service: Cardiovascular;  Laterality: N/A;  . BLEPHAROPLASTY Bilateral   . EXCISIONAL HEMORRHOIDECTOMY    . FOOT ARTHRODESIS, MODIFIED [place]BRIDE Bilateral   . KNEE ARTHROSCOPY Right   . LEFT HEART CATHETERIZATION WITH CORONARY ANGIOGRAM N/A 10/04/2012   Procedure: LEFT HEART CATHETERIZATION WITH CORONARY ANGIOGRAM;  Surgeon: Peter M Martinique, MD;  Location: [facility] CATH LAB;  Service: Cardiovascular;  Laterality: N/A;  . LUMBAR LAMINECTOMY  11/03/2006    Bilateral 3, 4, 5 laminectomy, partial L2, decompression of the thecal sac, foraminotomy, posterolateral arthrodesis L3-S1 with autograft   -- SURGEON:  Leeroy Cha, M.D.   . TONSILLECTOMY    . TOTAL KNEE ARTHROPLASTY Left 07/15/2015   Procedure: LEFT TOTAL KNEE ARTHROPLASTY;  Surgeon: Frederik Pear, MD;  Location: [place];  Service: Orthopedics;  Laterality: Left;  . TUBAL LIGATION    . VAGINAL HYSTERECTOMY      Current Outpatient Medications  Medication Sig Dispense Refill  . acetaminophen (TYLENOL) 650 MG CR tablet Take 650 mg by mouth every 8 (eight) hours as needed for pain.    . Ascorbic Acid (VITAMIN C) 1000  MG tablet Take 1,000 mg by mouth daily.     . B Complex Vitamins (VITAMIN B COMPLEX PO) Take 1 tablet by mouth daily. Reported on 01/30/2015    . carvedilol (COREG) 6.25 MG tablet Take 1 tablet (6.25 mg total) by mouth 2 (two) times daily. 180 tablet 0  . Coenzyme Q10 (COQ10) 100 MG CAPS Take 1 capsule by mouth daily as needed.     . DULoxetine (CYMBALTA) 30 MG capsule Take 30 mg by mouth daily.     Marland Kitchen ENTRESTO 24-26 MG TAKE 1 TABLET BY MOUTH TWICE DAILY 60 tablet 3  . flecainide (TAMBOCOR) 100 MG tablet Take 0.5 tablets (50 mg total) by mouth 2 (two) times daily. 90 tablet 3  . furosemide (LASIX) 20 MG tablet Take 1 tablet (20 mg total) by mouth as needed for fluid (no  more than TWICE PER WEEK). 30 tablet 11  . gabapentin (NEURONTIN) 100 MG capsule Take 100 mg by mouth at bedtime.    Marland Kitchen levothyroxine (SYNTHROID, LEVOTHROID) 50 MCG tablet Take 50 mcg by mouth daily.      . Magnesium Oxide (MAG-200 PO) Take 1 tablet by mouth 2 (two) times daily.     . multivitamin (THERAGRAN) per tablet Take 1 tablet by mouth daily.      Marland Kitchen spironolactone (ALDACTONE) 25 MG tablet Take 1 tablet (25 mg total) by mouth daily. 90 tablet 3  . tiZANidine (ZANAFLEX) 4 MG tablet Take 0.5 tablets by mouth as needed for muscle spasms.     Current Facility-Administered Medications  Medication Dose Route Frequency Provider Last Rate Last Dose  . triamcinolone acetonide (KENALOG) 10 MG/ML injection 10 mg  10 mg Other Once Regal, Norman S, DPM      . triamcinolone acetonide (KENALOG) 10 MG/ML injection 10 mg  10 mg Other Once Regal, Norman S, DPM      . triamcinolone acetonide (KENALOG) 10 MG/ML injection 10 mg  10 mg Other Once Wallene Huh, DPM        Allergies  Allergen Reactions  . Statins Other (See Comments)    "Makes her bones hurt and very sore"  . Niaspan [Niacin Er]     High doses, causes hot flashes & aching   . Benadryl [Diphenhydramine] Other (See Comments)    Makes pt feel jittery   . Dilaudid [Hydromorphone Hcl] Itching    Review of Systems negative except from HPI and PMH  Physical Exam BP 124/80   Pulse 71   Ht 5\' 8"  (1.727 m)   Wt 191 lb 6.4 oz (86.8 kg)   SpO2 95%   BMI 29.10 kg/m  Well developed and nourished in no acute distress HENT normal Neck supple with JVP-flat Clear Regular rate and rhythm, no murmurs or gallops Abd-soft with active BS No Clubbing cyanosis edema Skin-warm and dry A & Oriented  Grossly normal sensory and motor function   ECG sinus @ 71 P-synchronous/ AV  pacing     Assessment and  Plan  Nonischemic cardiomyopathy-- interval normalization  Congestive heart failure-chronic-diastolic Euvolemic continue current  meds  Medtronic ICD-CRT The patient's device was interrogated.  The information was reviewed. No changes were made in the programming.     Atrial fibrillation-paroxysmal  LV threshold change  Chronotropic incompetence  Depression   PVCs  Euvolemic continue current meds  Tolerating flecainide  No intercurrent atrial fibrillation or flutter  Mood better   We spent more than 50% of our >25 min visit in face to face  counseling regarding the above

## 2018-01-07 NOTE — Patient Instructions (Addendum)
Medication Instructions:  Your physician recommends that you continue on your current medications as directed. Please refer to the Current Medication list given to you today.  Labwork: None ordered.  Testing/Procedures: None ordered.  Follow-Up: Your physician recommends that you schedule a follow-up appointment in:   3 months with our [facility]  12 months with Chanetta Marshall, NP for your yearly follow up  Any Other Special Instructions Will Be Listed Below (If Applicable).     If you need a refill on your cardiac medications before your next appointment, please call your pharmacy.

## 2018-01-10 ENCOUNTER — Ambulatory Visit (HOSPITAL_COMMUNITY)
Admission: RE | Admit: 2018-01-10 | Discharge: 2018-01-10 | Disposition: A | Discharge status: Home / Self Care | Payer: Medicare HMO | Source: Ambulatory Visit | Attending: Cardiology | Admitting: Cardiology

## 2018-01-10 ENCOUNTER — Other Ambulatory Visit: Payer: Self-pay

## 2018-01-10 VITALS — BP 134/82 | HR 78 | Wt 190.4 lb

## 2018-01-10 DIAGNOSIS — R911 Solitary pulmonary nodule: Secondary | ICD-10-CM | POA: Diagnosis not present

## 2018-01-10 DIAGNOSIS — Z79890 Hormone replacement therapy (postmenopausal): Secondary | ICD-10-CM | POA: Diagnosis not present

## 2018-01-10 DIAGNOSIS — R0789 Other chest pain: Secondary | ICD-10-CM | POA: Insufficient documentation

## 2018-01-10 DIAGNOSIS — G629 Polyneuropathy, unspecified: Secondary | ICD-10-CM | POA: Diagnosis not present

## 2018-01-10 DIAGNOSIS — F329 Major depressive disorder, single episode, unspecified: Secondary | ICD-10-CM | POA: Insufficient documentation

## 2018-01-10 DIAGNOSIS — E785 Hyperlipidemia, unspecified: Secondary | ICD-10-CM | POA: Insufficient documentation

## 2018-01-10 DIAGNOSIS — I493 Ventricular premature depolarization: Secondary | ICD-10-CM | POA: Diagnosis not present

## 2018-01-10 DIAGNOSIS — I5022 Chronic systolic (congestive) heart failure: Secondary | ICD-10-CM | POA: Insufficient documentation

## 2018-01-10 DIAGNOSIS — Z79899 Other long term (current) drug therapy: Secondary | ICD-10-CM | POA: Insufficient documentation

## 2018-01-10 DIAGNOSIS — R69 Illness, unspecified: Secondary | ICD-10-CM | POA: Diagnosis not present

## 2018-01-10 DIAGNOSIS — M170 Bilateral primary osteoarthritis of knee: Secondary | ICD-10-CM | POA: Insufficient documentation

## 2018-01-10 DIAGNOSIS — I428 Other cardiomyopathies: Secondary | ICD-10-CM | POA: Insufficient documentation

## 2018-01-10 LAB — BASIC METABOLIC PANEL
Anion gap: 13 (ref 5–15)
BUN: 22 mg/dL (ref 8–23)
CHLORIDE: 100 mmol/L (ref 98–111)
CO2: 25 mmol/L (ref 22–32)
Calcium: 9.9 mg/dL (ref 8.9–10.3)
Creatinine, Ser: 1.16 mg/dL — ABNORMAL HIGH (ref 0.44–1.00)
GFR calc Af Amer: 50 mL/min — ABNORMAL LOW (ref >60)
GFR calc non Af Amer: 44 mL/min — ABNORMAL LOW (ref >60)
GLUCOSE: 101 mg/dL — AB (ref 70–99)
Potassium: 4.6 mmol/L (ref 3.5–5.1)
Sodium: 138 mmol/L (ref 135–145)

## 2018-01-10 NOTE — Patient Instructions (Signed)
Labs today We will only contact you if something comes back abnormal or we need to make some changes. Otherwise no news is good news!  Your physician has requested that you have an echocardiogram. Echocardiography is a painless test that uses sound waves to create images of your heart. It provides your doctor with information about the size and shape of your heart and how well your heart's chambers and valves are working. This procedure takes approximately one hour. There are no restrictions for this procedure.   Your physician recommends that you schedule a follow-up appointment in: 6 months with an ECHO. Call in March to schedule your appointment.

## 2018-01-10 NOTE — Progress Notes (Signed)
Samples Entresto 24-26mg  1 bottle (28 tabs) given to patient.   Lot number [id number] exp 3/22.

## 2018-01-11 NOTE — Progress Notes (Signed)
Patient ID: Heather Smith, female   DOB: 27-Oct-1932, 84 y.o.   MRN: [id number] PCP: Dr. Kenton Kingfisher HF Cardiology: Aundra Dubin  84 yo with history of nonischemic cardiomyopathy and chronic systolic HF.  In 9/14, she was admitted to [facility] with chest pain.  Cardiac cath was done, showing no significant coronary disease.  However, echo showed EF 30-35%.  She was started on meds for CHF, and echo was repeated in 12/14, EF was read as 20-25%.  She had a Medtonic CRT-D device placed (had baseline LBBB).  In 3/15, the LV lead was not capturing properly so device had to be reprogrammed.  She had AV optimization in 5/15.  Limited echo at that time showed improvement in EF to 40-45%.  CPX in 5/15 was submaximal but suggested low normal to mildly reduced functional capacity. Last echo in 6/16 showed EF 35-40% with diffuse hypokinesis.  CPX was done in 9/16.  This actually showed good functional capacity. She had repeat echo in 1/17, showing EF increased to 60-65%.  Most recent echo in 8/18 showed EF 50-55% with mild LV dilation and mild MR.    She was seen by Dr. Caryl Comes for frequent PVCs (symptomatic).  She was started on flecainide.  She thinks that this has helped .   Atypical chest pain in 11/19, Cardiolite was normal.   She returns for followup of CHF.  Weight is stable.  She is doing well.  She is short of breath only with moderate-heavy exertion.  No palpitations, she remains on flecainide.  No chest pain.  No orthopnea/PND.    Labs (9/14): TSH normal Labs (3/15): K 4.7, creatinine 1.1 Labs (5/15): K 4.9, creatinine 1.1, BNP 44 Labs (6/15): K 5.1, creatinine 1.4, pro-BNP 39 Labs (6/15): K 5.3, creatinine 1.53 Labs (12/15): K 4.4, creatinine 1.03 Labs (5/16): K 4, creatinine 1.0, BNP 34.6 Labs (6/16): K 4.3, creatinine 1.1 Labs (9/16): K 4.4, creatinine 1.16 Labs (11/16): K 4.5, creatinine 1.16 Labs (12/16): K 4.6, creatinine 1.16, BNP 22, Mg 1.9 Labs (1/17): K 4.3, creatinine 1.34, HCT 42.4, ESR  normal Labs (6/17): K 4.6, creatinine 1.23, hgb 9.9, TSH normal Labs (6/18): K 4.7, creatinine 1.09, BNP 94, hgb 13.8, TSH normal Labs (12/18): K 4.7, creatinine 0.98 Labs (10/19): BNP 25, K 4.8, creatinine 1.11  PMH: 1. Chronic systolic CHF: Nonischemic cardiomyopathy.  LHC (9/14) with no significant coronary disease, EF 25-30% with global hypokinesis.  Echo (9/14) with EF 30-35%, moderate LV dilation, diffuse hypokinesis.  Echo (12/14) with EF 20-25%.  She does not drink ETOH.  She had Medtronic CRT-D device implantation in 1/15. Echo (5/15) with EF 40-45%, mild LVH.  CPX (5/15) was submaximal with RER 0.99, peak VO2 14, VE/VCO2 37.3; low normal functional capacity may be limited by loss of BiV pacing at higher HR and by deconditioning but interpretation somewhat limited since study was submaximal.  Echo (6/16) with EF 35-40%, severe LV dilation, diffuse hypokinesis, mild MR.  CPX (9/16) with peak VO2 17.7, VE/VCO2 slope 32.3, RER 1.11 => excellent functional capacity. Echo (11/17) with EF 60-65%.  - Echo (8/18): EF 50-55%, mildly dilated LV, mild MR.  2. Back pain s/p lumbar laminectomy 3. OA: Knees. Left TKR in 6/17.  4. Hyperlipidemia 5. Depression 6. PVCs 7. Peripheral neuropathy 8. Lung nodule: Last CT chest in 1/19 showed stable 6 mm LLL nodule, no further followup recommended.  9. Chest pain: Cardiolite (11/19) with EF 74%, no evidence for ischemia/infarction.   SH: Lives [place], married,  never smoked, no ETOH.    FH: Mother with MI at 84, possible ischemic cardiomyopathy.  Father with cardiomyopathy, died at 49.  She has 5 brothers, none with significant coronary disease.    ROS: All systems reviewed and negative except as per HPI.   Current Outpatient Medications  Medication Sig Dispense Refill  . acetaminophen (TYLENOL) 650 MG CR tablet Take 650 mg by mouth every 8 (eight) hours as needed for pain.    . Ascorbic Acid (VITAMIN C) 1000 MG tablet Take 1,000 mg by mouth  daily.     . B Complex Vitamins (VITAMIN B COMPLEX PO) Take 1 tablet by mouth daily. Reported on 01/30/2015    . carvedilol (COREG) 6.25 MG tablet Take 1 tablet (6.25 mg total) by mouth 2 (two) times daily. 180 tablet 0  . Coenzyme Q10 (COQ10) 100 MG CAPS Take 1 capsule by mouth daily as needed.     . DULoxetine (CYMBALTA) 30 MG capsule Take 30 mg by mouth daily.     Marland Kitchen ENTRESTO 24-26 MG TAKE 1 TABLET BY MOUTH TWICE DAILY 60 tablet 3  . flecainide (TAMBOCOR) 100 MG tablet Take 0.5 tablets (50 mg total) by mouth 2 (two) times daily. 90 tablet 3  . furosemide (LASIX) 20 MG tablet Take 1 tablet (20 mg total) by mouth as needed for fluid (no more than TWICE PER WEEK). 30 tablet 11  . gabapentin (NEURONTIN) 100 MG capsule Take 100 mg by mouth at bedtime.    Marland Kitchen levothyroxine (SYNTHROID, LEVOTHROID) 50 MCG tablet Take 50 mcg by mouth daily.      . Magnesium Oxide (MAG-200 PO) Take 1 tablet by mouth 2 (two) times daily.     . multivitamin (THERAGRAN) per tablet Take 1 tablet by mouth daily.      . pantoprazole (PROTONIX) 40 MG tablet Take 40 mg by mouth daily.    Marland Kitchen spironolactone (ALDACTONE) 25 MG tablet Take 1 tablet (25 mg total) by mouth daily. 90 tablet 3  . tiZANidine (ZANAFLEX) 4 MG tablet Take 0.5 tablets by mouth as needed for muscle spasms.     Current Facility-Administered Medications  Medication Dose Route Frequency Provider Last Rate Last Dose  . triamcinolone acetonide (KENALOG) 10 MG/ML injection 10 mg  10 mg Other Once Regal, Norman S, DPM      . triamcinolone acetonide (KENALOG) 10 MG/ML injection 10 mg  10 mg Other Once Regal, Norman S, DPM      . triamcinolone acetonide (KENALOG) 10 MG/ML injection 10 mg  10 mg Other Once Wallene Huh, DPM       Vitals:   01/10/18 1412  BP: 134/82  Pulse: 78  SpO2: 98%  Weight: 86.4 kg (190 lb 6.4 oz)   Wt Readings from Last 3 Encounters:  01/10/18 86.4 kg (190 lb 6.4 oz)  01/07/18 86.8 kg (191 lb 6.4 oz)  12/08/17 85.3 kg (188 lb)    General: NAD Neck: No JVD, no thyromegaly or thyroid nodule.  Lungs: Clear to auscultation bilaterally with normal respiratory effort. CV: Nondisplaced PMI.  Heart regular S1/S2, no S3/S4, no murmur.  No peripheral edema.  No carotid bruit.  Normal pedal pulses.  Abdomen: Soft, nontender, no hepatosplenomegaly, no distention.  Skin: Intact without lesions or rashes.  Neurologic: Alert and oriented x 3.  Psych: Normal affect. Extremities: No clubbing or cyanosis.  HEENT: Normal.   Assessment/Plan:  1. Chronic systolic CHF: Nonischemic cardiomyopathy s/p Medtronic CRT-D. EF 35-40% (6/16). She does have a family  history of cardiomyopathy of uncertain etiology (father) but none of her siblings developed a cardiomyopathy.  Familial cardiomyopathy is a consideration.  Viral myocarditis is also a possible etiology.  Echo in 1/17 showed improvement in EF to 60-65%, echo in 8/18 showed EF 50-55%.  NYHA II symptoms.  She is not volume overloaded.   - Continue current Entresto, Coreg, and spironolactone.  - Can continue to use Lasix prn.  - I will arrange for repeat echo to make sure that EF is staying up.  - BMET today.  2. Depression: Improved on Celexa.  3. PVCs: Followed by EP, now on flecainide.  - Continue flecainide 50 mg bid.  Will need nodal blocking agent while on flecainide, she is on Coreg.  4. Lung nodule: Stable on 1/19 CT, no further followup recommended.   5. Chest pain: Atypical, has not had recently.  Normal Cardiolite in 11/19.   Followup in 6 months if EF remains near normal on echo.   Loralie Champagne 01/11/2018

## 2018-01-19 ENCOUNTER — Ambulatory Visit: Payer: Medicare HMO | Admitting: Podiatry

## 2018-01-31 ENCOUNTER — Telehealth (HOSPITAL_COMMUNITY): Payer: Self-pay | Admitting: Licensed Clinical Social Worker

## 2018-01-31 DIAGNOSIS — Z0100 Encounter for examination of eyes and vision without abnormal findings: Secondary | ICD-10-CM | POA: Diagnosis not present

## 2018-01-31 NOTE — Telephone Encounter (Signed)
CSW received call from pt inquiring about getting renewed for the PAN foundation.  CSW informed pt that PAN foundation is not currently open but that she is on the list for renewal.  CSW will continue to monitor foundation and assist in reapplying if it becomes open again  CSW will continue to follow and assist as needed  Jorge Ny, [place] Worker [facility] [phone]

## 2018-02-03 ENCOUNTER — Other Ambulatory Visit (HOSPITAL_COMMUNITY): Payer: Self-pay | Admitting: Cardiology

## 2018-02-04 ENCOUNTER — Ambulatory Visit (INDEPENDENT_AMBULATORY_CARE_PROVIDER_SITE_OTHER): Payer: Medicare HMO | Admitting: Podiatry

## 2018-02-04 ENCOUNTER — Encounter: Payer: Self-pay | Admitting: Podiatry

## 2018-02-04 DIAGNOSIS — M779 Enthesopathy, unspecified: Secondary | ICD-10-CM

## 2018-02-04 DIAGNOSIS — G629 Polyneuropathy, unspecified: Secondary | ICD-10-CM

## 2018-02-05 NOTE — Progress Notes (Signed)
Subjective:   Patient ID: Heather Smith, female   DOB: 85 y.o.   MRN: [id number]   HPI Patient states the injections have been effective in helping her and while she still has some discomfort she is significantly improved   ROS      Objective:  Physical Exam  Neurovascular status intact with dorsal tendinitis right improved with patient continued to have neuropathic symptoms that she also takes gabapentin for     Assessment:  2 separate problems with one being the probability for chronic tendinitis with arthritis and the second being neuropathy     Plan:  H&P discussed both problems and at this point I recommended mild heat therapy continue gabapentin wider type shoes and try and avoid pressure against the top of the foot.  Patient will be seen back for [place] to recheck again in the next few weeks and is encouraged to call with any questions concerns

## 2018-02-10 ENCOUNTER — Ambulatory Visit (HOSPITAL_COMMUNITY)
Admission: RE | Admit: 2018-02-10 | Discharge: 2018-02-10 | Disposition: A | Discharge status: Home / Self Care | Payer: Medicare HMO | Source: Ambulatory Visit | Attending: Family Medicine | Admitting: Family Medicine

## 2018-02-10 DIAGNOSIS — I5022 Chronic systolic (congestive) heart failure: Secondary | ICD-10-CM | POA: Diagnosis not present

## 2018-02-10 DIAGNOSIS — I447 Left bundle-branch block, unspecified: Secondary | ICD-10-CM | POA: Diagnosis not present

## 2018-02-10 DIAGNOSIS — I428 Other cardiomyopathies: Secondary | ICD-10-CM | POA: Diagnosis not present

## 2018-02-10 DIAGNOSIS — I081 Rheumatic disorders of both mitral and tricuspid valves: Secondary | ICD-10-CM | POA: Diagnosis not present

## 2018-02-10 DIAGNOSIS — E785 Hyperlipidemia, unspecified: Secondary | ICD-10-CM | POA: Insufficient documentation

## 2018-02-10 DIAGNOSIS — I4891 Unspecified atrial fibrillation: Secondary | ICD-10-CM | POA: Insufficient documentation

## 2018-02-10 DIAGNOSIS — I110 Hypertensive heart disease with heart failure: Secondary | ICD-10-CM | POA: Insufficient documentation

## 2018-02-10 DIAGNOSIS — R55 Syncope and collapse: Secondary | ICD-10-CM | POA: Diagnosis not present

## 2018-02-10 NOTE — Progress Notes (Signed)
  Echocardiogram 2D Echocardiogram has been performed.  Heather Smith 02/10/2018, 1:55 PM

## 2018-02-14 ENCOUNTER — Telehealth (HOSPITAL_COMMUNITY): Payer: Self-pay

## 2018-02-14 NOTE — Telephone Encounter (Signed)
Pt called and given ECHO results verbalized understanding

## 2018-02-23 ENCOUNTER — Encounter (HOSPITAL_COMMUNITY): Payer: Medicare HMO | Admitting: Cardiology

## 2018-03-01 ENCOUNTER — Telehealth (HOSPITAL_COMMUNITY): Payer: Self-pay | Admitting: Licensed Clinical Social Worker

## 2018-03-01 NOTE — Telephone Encounter (Signed)
CSW received notification that PAN foundation is back open- CSW called pt who confirmed she is still in need of assistance with Entresto copays and would want to renew [place].  CSW submitted renewal and pt was awarded $1,000 until 12/01/2018.   CSW will continue to follow and assist as needed  Jorge Ny, LCSW Clinical Social Worker [facility] [phone]

## 2018-03-14 DIAGNOSIS — I509 Heart failure, unspecified: Secondary | ICD-10-CM | POA: Diagnosis not present

## 2018-03-14 DIAGNOSIS — J309 Allergic rhinitis, unspecified: Secondary | ICD-10-CM | POA: Diagnosis not present

## 2018-03-14 DIAGNOSIS — G629 Polyneuropathy, unspecified: Secondary | ICD-10-CM | POA: Diagnosis not present

## 2018-03-14 DIAGNOSIS — R69 Illness, unspecified: Secondary | ICD-10-CM | POA: Diagnosis not present

## 2018-03-14 DIAGNOSIS — I110 Hypertensive heart disease with heart failure: Secondary | ICD-10-CM | POA: Diagnosis not present

## 2018-03-14 DIAGNOSIS — E669 Obesity, unspecified: Secondary | ICD-10-CM | POA: Diagnosis not present

## 2018-03-14 DIAGNOSIS — Z008 Encounter for other general examination: Secondary | ICD-10-CM | POA: Diagnosis not present

## 2018-03-14 DIAGNOSIS — I4891 Unspecified atrial fibrillation: Secondary | ICD-10-CM | POA: Diagnosis not present

## 2018-03-14 DIAGNOSIS — G8929 Other chronic pain: Secondary | ICD-10-CM | POA: Diagnosis not present

## 2018-03-14 DIAGNOSIS — K219 Gastro-esophageal reflux disease without esophagitis: Secondary | ICD-10-CM | POA: Diagnosis not present

## 2018-03-14 DIAGNOSIS — E039 Hypothyroidism, unspecified: Secondary | ICD-10-CM | POA: Diagnosis not present

## 2018-03-29 ENCOUNTER — Other Ambulatory Visit (HOSPITAL_COMMUNITY): Payer: Self-pay | Admitting: Cardiology

## 2018-04-14 ENCOUNTER — Other Ambulatory Visit: Payer: Self-pay

## 2018-04-14 ENCOUNTER — Ambulatory Visit (INDEPENDENT_AMBULATORY_CARE_PROVIDER_SITE_OTHER): Payer: Medicare HMO | Admitting: Nurse Practitioner

## 2018-04-14 DIAGNOSIS — I5022 Chronic systolic (congestive) heart failure: Secondary | ICD-10-CM

## 2018-04-14 LAB — CUP PACEART INCLINIC DEVICE CHECK
Battery Remaining Longevity: 4 mo
Battery Voltage: 2.79 V
Brady Statistic AP VP Percent: 44.23 %
Brady Statistic AP VS Percent: 0.67 %
Brady Statistic AS VP Percent: 54.22 %
Brady Statistic AS VS Percent: 0.89 %
Brady Statistic RA Percent Paced: 44.79 %
Date Time Interrogation Session: 20200312110541
HighPow Impedance: 71 Ohm
Implantable Lead Implant Date: 20150121
Implantable Lead Location: 753858
Implantable Lead Location: 753859
Implantable Lead Location: 753860
Implantable Lead Model: 4298
Implantable Pulse Generator Implant Date: 20150121
Lead Channel Impedance Value: 304 Ohm
Lead Channel Impedance Value: 456 Ohm
Lead Channel Impedance Value: 494 Ohm
Lead Channel Impedance Value: 513 Ohm
Lead Channel Impedance Value: 570 Ohm
Lead Channel Impedance Value: 589 Ohm
Lead Channel Impedance Value: 589 Ohm
Lead Channel Impedance Value: 665 Ohm
Lead Channel Impedance Value: 703 Ohm
Lead Channel Impedance Value: 779 Ohm
Lead Channel Impedance Value: 855 Ohm
Lead Channel Impedance Value: 912 Ohm
Lead Channel Pacing Threshold Amplitude: 0.5 V
Lead Channel Pacing Threshold Amplitude: 0.75 V
Lead Channel Pacing Threshold Amplitude: 1.75 V
Lead Channel Pacing Threshold Pulse Width: 0.4 ms
Lead Channel Pacing Threshold Pulse Width: 0.4 ms
Lead Channel Pacing Threshold Pulse Width: 0.7 ms
Lead Channel Sensing Intrinsic Amplitude: 15.375 mV
Lead Channel Sensing Intrinsic Amplitude: 2.625 mV
Lead Channel Sensing Intrinsic Amplitude: 2.625 mV
Lead Channel Setting Pacing Amplitude: 1.5 V
Lead Channel Setting Pacing Amplitude: 2 V
Lead Channel Setting Pacing Amplitude: 3.5 V
Lead Channel Setting Pacing Pulse Width: 0.4 ms
Lead Channel Setting Pacing Pulse Width: 0.7 ms
Lead Channel Setting Sensing Sensitivity: 0.3 mV
MDC IDC LEAD IMPLANT DT: 20150121
MDC IDC LEAD IMPLANT DT: 20150121
MDC IDC MSMT LEADCHNL LV IMPEDANCE VALUE: 437 Ohm
MDC IDC MSMT LEADCHNL RV SENSING INTR AMPL: 15.375 mV
MDC IDC STAT BRADY RV PERCENT PACED: 98.04 %

## 2018-04-14 NOTE — Progress Notes (Signed)
ICD check in clinic. Normal device function. Thresholds and sensing consistent with previous device measurements. Impedance trends stable over time. No evidence of any ventricular arrhythmias. No mode switches. Histogram distribution appropriate for patient and level of activity. No changes made this session. Device programmed at appropriate safety margins. Device programmed to optimize intrinsic conduction. Estimated longevity 3 months. Pt has home monitor that is communicating. Remote check in 1 month to check battery status.

## 2018-04-23 ENCOUNTER — Other Ambulatory Visit (HOSPITAL_COMMUNITY): Payer: Self-pay | Admitting: Cardiology

## 2018-05-16 ENCOUNTER — Ambulatory Visit (INDEPENDENT_AMBULATORY_CARE_PROVIDER_SITE_OTHER): Payer: Medicare HMO | Admitting: *Deleted

## 2018-05-16 ENCOUNTER — Other Ambulatory Visit: Payer: Self-pay

## 2018-05-16 DIAGNOSIS — I428 Other cardiomyopathies: Secondary | ICD-10-CM

## 2018-05-16 DIAGNOSIS — I5022 Chronic systolic (congestive) heart failure: Secondary | ICD-10-CM

## 2018-05-16 LAB — CUP PACEART REMOTE DEVICE CHECK
Battery Remaining Longevity: 3 mo
Battery Voltage: 2.77 V
Brady Statistic AP VP Percent: 41.09 %
Brady Statistic AP VS Percent: 0.61 %
Brady Statistic AS VP Percent: 57.41 %
Brady Statistic AS VS Percent: 0.88 %
Brady Statistic RA Percent Paced: 41.65 %
Brady Statistic RV Percent Paced: 98.17 %
Date Time Interrogation Session: 20200413071703
HighPow Impedance: 73 Ohm
Implantable Lead Implant Date: 20150121
Implantable Lead Implant Date: 20150121
Implantable Lead Implant Date: 20150121
Implantable Lead Location: 753858
Implantable Lead Location: 753859
Implantable Lead Location: 753860
Implantable Lead Model: 4298
Implantable Lead Model: 5076
Implantable Pulse Generator Implant Date: 20150121
Lead Channel Impedance Value: 304 Ohm
Lead Channel Impedance Value: 380 Ohm
Lead Channel Impedance Value: 399 Ohm
Lead Channel Impedance Value: 437 Ohm
Lead Channel Impedance Value: 494 Ohm
Lead Channel Impedance Value: 494 Ohm
Lead Channel Impedance Value: 532 Ohm
Lead Channel Impedance Value: 532 Ohm
Lead Channel Impedance Value: 589 Ohm
Lead Channel Impedance Value: 627 Ohm
Lead Channel Impedance Value: 703 Ohm
Lead Channel Impedance Value: 760 Ohm
Lead Channel Impedance Value: 779 Ohm
Lead Channel Pacing Threshold Amplitude: 0.5 V
Lead Channel Pacing Threshold Amplitude: 0.875 V
Lead Channel Pacing Threshold Amplitude: 2.25 V
Lead Channel Pacing Threshold Pulse Width: 0.4 ms
Lead Channel Pacing Threshold Pulse Width: 0.4 ms
Lead Channel Pacing Threshold Pulse Width: 0.7 ms
Lead Channel Sensing Intrinsic Amplitude: 14.875 mV
Lead Channel Sensing Intrinsic Amplitude: 14.875 mV
Lead Channel Sensing Intrinsic Amplitude: 2.625 mV
Lead Channel Sensing Intrinsic Amplitude: 2.625 mV
Lead Channel Setting Pacing Amplitude: 1.75 V
Lead Channel Setting Pacing Amplitude: 2 V
Lead Channel Setting Pacing Amplitude: 3.25 V
Lead Channel Setting Pacing Pulse Width: 0.4 ms
Lead Channel Setting Pacing Pulse Width: 0.7 ms
Lead Channel Setting Sensing Sensitivity: 0.3 mV

## 2018-05-26 NOTE — Progress Notes (Signed)
Remote ICD transmission.   

## 2018-06-16 ENCOUNTER — Ambulatory Visit (INDEPENDENT_AMBULATORY_CARE_PROVIDER_SITE_OTHER): Payer: Medicare HMO | Admitting: *Deleted

## 2018-06-16 ENCOUNTER — Other Ambulatory Visit: Payer: Self-pay

## 2018-06-16 DIAGNOSIS — I428 Other cardiomyopathies: Secondary | ICD-10-CM

## 2018-06-16 LAB — CUP PACEART REMOTE DEVICE CHECK
Battery Remaining Longevity: 2 mo
Battery Voltage: 2.75 V
Brady Statistic AP VP Percent: 51.09 %
Brady Statistic AP VS Percent: 0.68 %
Brady Statistic AS VP Percent: 47.49 %
Brady Statistic AS VS Percent: 0.75 %
Brady Statistic RA Percent Paced: 51.66 %
Brady Statistic RV Percent Paced: 98.19 %
Date Time Interrogation Session: 20200514083724
HighPow Impedance: 69 Ohm
Implantable Lead Implant Date: 20150121
Implantable Lead Implant Date: 20150121
Implantable Lead Implant Date: 20150121
Implantable Lead Location: 753858
Implantable Lead Location: 753859
Implantable Lead Location: 753860
Implantable Lead Model: 4298
Implantable Lead Model: 5076
Implantable Pulse Generator Implant Date: 20150121
Lead Channel Impedance Value: 342 Ohm
Lead Channel Impedance Value: 380 Ohm
Lead Channel Impedance Value: 399 Ohm
Lead Channel Impedance Value: 437 Ohm
Lead Channel Impedance Value: 494 Ohm
Lead Channel Impedance Value: 494 Ohm
Lead Channel Impedance Value: 532 Ohm
Lead Channel Impedance Value: 589 Ohm
Lead Channel Impedance Value: 589 Ohm
Lead Channel Impedance Value: 646 Ohm
Lead Channel Impedance Value: 703 Ohm
Lead Channel Impedance Value: 722 Ohm
Lead Channel Impedance Value: 779 Ohm
Lead Channel Pacing Threshold Amplitude: 0.5 V
Lead Channel Pacing Threshold Amplitude: 0.75 V
Lead Channel Pacing Threshold Amplitude: 2.375 V
Lead Channel Pacing Threshold Pulse Width: 0.4 ms
Lead Channel Pacing Threshold Pulse Width: 0.4 ms
Lead Channel Pacing Threshold Pulse Width: 0.7 ms
Lead Channel Sensing Intrinsic Amplitude: 1.875 mV
Lead Channel Sensing Intrinsic Amplitude: 1.875 mV
Lead Channel Sensing Intrinsic Amplitude: 13.625 mV
Lead Channel Sensing Intrinsic Amplitude: 13.625 mV
Lead Channel Setting Pacing Amplitude: 1.5 V
Lead Channel Setting Pacing Amplitude: 2 V
Lead Channel Setting Pacing Amplitude: 3.75 V
Lead Channel Setting Pacing Pulse Width: 0.4 ms
Lead Channel Setting Pacing Pulse Width: 0.7 ms
Lead Channel Setting Sensing Sensitivity: 0.3 mV

## 2018-06-21 DIAGNOSIS — G6289 Other specified polyneuropathies: Secondary | ICD-10-CM | POA: Diagnosis not present

## 2018-06-21 DIAGNOSIS — N183 Chronic kidney disease, stage 3 (moderate): Secondary | ICD-10-CM | POA: Diagnosis not present

## 2018-06-21 DIAGNOSIS — I5022 Chronic systolic (congestive) heart failure: Secondary | ICD-10-CM | POA: Diagnosis not present

## 2018-06-21 DIAGNOSIS — R69 Illness, unspecified: Secondary | ICD-10-CM | POA: Diagnosis not present

## 2018-06-21 DIAGNOSIS — R7303 Prediabetes: Secondary | ICD-10-CM | POA: Diagnosis not present

## 2018-06-21 DIAGNOSIS — E039 Hypothyroidism, unspecified: Secondary | ICD-10-CM | POA: Diagnosis not present

## 2018-06-21 DIAGNOSIS — E782 Mixed hyperlipidemia: Secondary | ICD-10-CM | POA: Diagnosis not present

## 2018-06-21 DIAGNOSIS — K219 Gastro-esophageal reflux disease without esophagitis: Secondary | ICD-10-CM | POA: Diagnosis not present

## 2018-06-21 DIAGNOSIS — I10 Essential (primary) hypertension: Secondary | ICD-10-CM | POA: Diagnosis not present

## 2018-06-22 ENCOUNTER — Encounter: Payer: Self-pay | Admitting: Cardiology

## 2018-06-22 NOTE — Progress Notes (Signed)
Remote ICD transmission.   

## 2018-06-30 ENCOUNTER — Telehealth: Payer: Self-pay | Admitting: Student

## 2018-06-30 NOTE — Telephone Encounter (Signed)
Pt device at RRT. Needs appt to discuss gen change. Pt aware and verbalizes understanding.

## 2018-06-30 NOTE — Telephone Encounter (Signed)
Pt returning [place] phone call. I let her know her ICD has reached that RRT. The pt states he ICd did go off this morning and scared her.  I told the pt I will have Jonni Sanger call her back to discuss her options. The best number to call her is [phone]

## 2018-06-30 NOTE — Telephone Encounter (Signed)
Attempted to call pt to notify her of RRT reached 5.28.2020 BiV ICD   LMOM to call back.

## 2018-07-04 NOTE — Telephone Encounter (Signed)
Noted  

## 2018-07-06 ENCOUNTER — Other Ambulatory Visit: Payer: Self-pay

## 2018-07-06 ENCOUNTER — Encounter: Payer: Self-pay | Admitting: Internal Medicine

## 2018-07-06 ENCOUNTER — Telehealth (INDEPENDENT_AMBULATORY_CARE_PROVIDER_SITE_OTHER): Payer: Medicare HMO | Admitting: Internal Medicine

## 2018-07-06 VITALS — BP 109/62 | Ht 68.0 in | Wt 188.0 lb

## 2018-07-06 DIAGNOSIS — I428 Other cardiomyopathies: Secondary | ICD-10-CM | POA: Diagnosis not present

## 2018-07-06 DIAGNOSIS — Z95810 Presence of automatic (implantable) cardiac defibrillator: Secondary | ICD-10-CM

## 2018-07-06 DIAGNOSIS — I493 Ventricular premature depolarization: Secondary | ICD-10-CM

## 2018-07-06 NOTE — Progress Notes (Signed)
Electrophysiology TeleHealth Note   Due to national recommendations of social distancing due to COVID 19, an audio/video telehealth visit is felt to be most appropriate for this patient at this time. The patient did not have access to video technology/had technical difficulties with video requiring transitioning to audio format only (telephone).  All issues noted in this document were discussed and addressed.  No physical exam could be performed with this format.     See MyChart message from today for the patient's consent to telehealth for [facility].   Date:  07/06/2018   ID:  Heather Smith, DOB 06-24-1932, MRN [id number]  Location: patient's home  Provider location: [address], [place]  Evaluation Performed: Follow-up visit  PCP:  Shirline Frees, MD  Cardiologist:   DM  Electrophysiologist:  SK   Chief Complaint:  Cardiomyopathy*PVCs and CRT  History of Present Illness:    Heather Smith is a 85 y.o. female who presents via audio/video conferencing for a telehealth visit today.  Since last being seen in our clinic, the patient reports doing pretty well  The patient denies chest pain, shortness of breath, nocturnal dyspnea, orthopnea or peripheral edema.  There have been no palpitations, lightheadedness or syncope.    DATE TEST EF   12/14 Echo  20-25%   1/17    Echo   55-62 %   1/18    Echo  55-60 %   11/19 MYOVIEW 75% NO ischemia        Date Cr K  8//18 1.07 4.8  10/19  1.11 4.8  12/19 1.16 4.6   DATE QRSduration Flecainide  7/18 144    12/18 168 50  12/19 174 50    The patient denies symptoms of fevers, chills, cough, or new SOB worrisome for COVID 19.    Past Medical History:  Diagnosis Date  . Chest pain    a. Nl cath in the 90's;  b. 04/2005: Myoview: subtle areao of ? reversibility in apical segment of anteromedial LV - ? attenuation, EF 54%;  c. 05/2012 MV: small, fixed mild apical septal perfusion defect, ? attenuation due  to lbbb, no ischemia.  . Complication of anesthesia    difficult to wake up and blood pressure drops  . GERD (gastroesophageal reflux disease)   . Hypercholesterolemia   . Hypertension   . Hypothyroidism   . Left bundle branch block   . LV lead partial dislodgment 05/30/2013  . Neurogenic claudication due to lumbar spinal stenosis   . Non-ischemic cardiomyopathy ([place])    [place] 10/04/12: Normal coronary arteries, EF 25-30% with global HK.  Echocardiogram 10/05/12: EF 30-35%, diffuse HK, moderate LAE. Echocardiogram (01/2013): Lateral to septal wall dyssynchrony EF 20-25%, diffuse HK  . Osteoarthritis    "about all over" (02/22/2013)  . Spinal stenosis   . Vitamin D deficiency     Past Surgical History:  Procedure Laterality Date  . APPENDECTOMY    . BI-VENTRICULAR IMPLANTABLE CARDIOVERTER DEFIBRILLATOR N/A 02/22/2013   Procedure: BI-VENTRICULAR IMPLANTABLE CARDIOVERTER DEFIBRILLATOR  (CRT-D);  Surgeon: Deboraha Sprang, MD;  Location: [facility] CATH LAB;  Service: Cardiovascular;  Laterality: N/A;  . BLEPHAROPLASTY Bilateral   . EXCISIONAL HEMORRHOIDECTOMY    . FOOT ARTHRODESIS, MODIFIED MCBRIDE Bilateral   . KNEE ARTHROSCOPY Right   . LEFT HEART CATHETERIZATION WITH CORONARY ANGIOGRAM N/A 10/04/2012   Procedure: LEFT HEART CATHETERIZATION WITH CORONARY ANGIOGRAM;  Surgeon: Peter M Martinique, MD;  Location: [facility] CATH LAB;  Service: Cardiovascular;  Laterality: N/A;  . LUMBAR LAMINECTOMY  11/03/2006    Bilateral 3, 4, 5 laminectomy, partial L2, decompression of the thecal sac, foraminotomy, posterolateral arthrodesis L3-S1 with autograft   -- SURGEON:  Leeroy Cha, M.D.   . TONSILLECTOMY    . TOTAL KNEE ARTHROPLASTY Left 07/15/2015   Procedure: LEFT TOTAL KNEE ARTHROPLASTY;  Surgeon: Frederik Pear, MD;  Location: [facility];  Service: Orthopedics;  Laterality: Left;  . TUBAL LIGATION    . VAGINAL HYSTERECTOMY      Current Outpatient Medications  Medication Sig Dispense Refill  . acetaminophen (TYLENOL) 650  MG CR tablet Take 650 mg by mouth every 8 (eight) hours as needed for pain.    . Ascorbic Acid (VITAMIN C) 1000 MG tablet Take 1,000 mg by mouth daily.     . B Complex Vitamins (VITAMIN B COMPLEX PO) Take 1 tablet by mouth daily. Reported on 01/30/2015    . carvedilol (COREG) 6.25 MG tablet TAKE 1 TABLET BY MOUTH TWICE DAILY NEED  APPOINTMENT  FOR  FUTURE  REFILLS 180 tablet 0  . Coenzyme Q10 (COQ10) 100 MG CAPS Take 1 capsule by mouth daily as needed.     . DULoxetine (CYMBALTA) 30 MG capsule Take 30 mg by mouth daily.     Marland Kitchen ENTRESTO 24-26 MG TAKE 1 TABLET BY MOUTH TWICE DAILY 180 tablet 1  . flecainide (TAMBOCOR) 100 MG tablet Take 0.5 tablets (50 mg total) by mouth 2 (two) times daily. 90 tablet 3  . furosemide (LASIX) 20 MG tablet Take 1 tablet (20 mg total) by mouth as needed for fluid (no more than TWICE PER WEEK). 30 tablet 11  . gabapentin (NEURONTIN) 100 MG capsule Take 100 mg by mouth at bedtime.    Marland Kitchen levothyroxine (SYNTHROID, LEVOTHROID) 50 MCG tablet Take 50 mcg by mouth daily.      . Magnesium Oxide (MAG-200 PO) Take 1 tablet by mouth 2 (two) times daily.     . multivitamin (THERAGRAN) per tablet Take 1 tablet by mouth daily.      . pantoprazole (PROTONIX) 40 MG tablet Take 40 mg by mouth daily.    Marland Kitchen spironolactone (ALDACTONE) 25 MG tablet Take 1 tablet (25 mg total) by mouth daily. 90 tablet 3  . tiZANidine (ZANAFLEX) 4 MG tablet Take 0.5 tablets by mouth as needed for muscle spasms.     Current Facility-Administered Medications  Medication Dose Route Frequency Provider Last Rate Last Dose  . triamcinolone acetonide (KENALOG) 10 MG/ML injection 10 mg  10 mg Other Once Regal, Norman S, DPM      . triamcinolone acetonide (KENALOG) 10 MG/ML injection 10 mg  10 mg Other Once Regal, Norman S, DPM      . triamcinolone acetonide (KENALOG) 10 MG/ML injection 10 mg  10 mg Other Once Wallene Huh, DPM        Allergies:   Statins; Niaspan [niacin er]; Benadryl [diphenhydramine]; and  Dilaudid [hydromorphone hcl]   Social History:  The patient  reports that she has never smoked. She has never used smokeless tobacco. She reports that she does not drink alcohol or use drugs.   Family History:  The patient's   family history includes Aneurysm in her brother and father; Bone cancer in her brother; Colon cancer in her brother; Heart attack in her mother; Hyperlipidemia in her mother; Lung cancer in her brother and mother; Stomach cancer in her brother; Thyroid disease in her mother.   ROS:  Please see the history of present  illness.   All other systems are personally reviewed and negative.    Exam:    Vital Signs:  BP 109/62   Ht 5\' 8"  (1.727 m)   Wt 188 lb (85.3 kg)   BMI 28.59 kg/m     Labs/Other Tests and Data Reviewed:    Recent Labs: 11/22/2017: B Natriuretic Peptide 25.2; Hemoglobin 12.9; Platelets 187; TSH 1.385 01/10/2018: BUN 22; Creatinine, Ser 1.16; Potassium 4.6; Sodium 138   Wt Readings from Last 3 Encounters:  07/06/18 188 lb (85.3 kg)  01/10/18 190 lb 6.4 oz (86.4 kg)  01/07/18 191 lb 6.4 oz (86.8 kg)     Other studies personally reviewed: Additional studies/ records that were reviewed today include: As above   Last device remote is reviewed from [place] PDF dated  * which reveals normal device function,   arrhythmias - none     ASSESSMENT & PLAN:    Nonischemic cardiomyopathy-- interval normalization  Congestive heart failure-chronic-diastolic    Medtronic ICD-CRT    Atrial fibrillation-paroxysmal  LV threshold change  Chronotropic incompetence  Depression   PVCs   No palps,  Tolerating flecainide--continue  With interval normalization of LV function related to CRT and device now at [facility], began the discussion as to replace the ICD with high or low voltage therapy. She will discuss it with her husband and family   Reviewed the data back to implant -- no therapy  No intercurrent atrial fibrillation or flutter        COVID 19 screen The patient denies symptoms of COVID 19 at this time.  The importance of social distancing was discussed today.  Follow-up:  telehealth visit  Early July prior to scheduled generator replacement Next remote: As Scheduled   Current medicines are reviewed at length with the patient today.   The patient does not have concerns regarding her medicines.  The following changes were made today:  none  Labs/ tests ordered today include:   No orders of the defined types were placed in this encounter.   Future tests ( post COVID )     Patient Risk:  after full review of this patients clinical status, I feel that they are at moderate risk at this time.  Today, I have spent 12 minutes with the patient with telehealth technology discussing the above.  Signed, Virl Axe, MD  07/06/2018 5:06 PM     [place] [address] [phone] (office) [phone] (fax)

## 2018-07-13 DIAGNOSIS — M62830 Muscle spasm of back: Secondary | ICD-10-CM | POA: Diagnosis not present

## 2018-08-02 ENCOUNTER — Other Ambulatory Visit (HOSPITAL_COMMUNITY): Payer: Self-pay | Admitting: Cardiology

## 2018-08-04 ENCOUNTER — Telehealth: Payer: Self-pay

## 2018-08-04 NOTE — Telephone Encounter (Signed)
I called and left patient a message about upcoming appointment on 08/10/18 with Dr. Caryl Comes. Does patient want to keep appointment as a virtual visit or come into the office?

## 2018-08-04 NOTE — Telephone Encounter (Signed)
Patient called back and changed virtual visit to office visit.

## 2018-08-10 ENCOUNTER — Other Ambulatory Visit: Payer: Self-pay

## 2018-08-10 ENCOUNTER — Encounter: Payer: Self-pay | Admitting: Internal Medicine

## 2018-08-10 ENCOUNTER — Ambulatory Visit (INDEPENDENT_AMBULATORY_CARE_PROVIDER_SITE_OTHER): Payer: Medicare HMO | Admitting: Internal Medicine

## 2018-08-10 ENCOUNTER — Telehealth: Payer: Self-pay | Admitting: Internal Medicine

## 2018-08-10 DIAGNOSIS — I493 Ventricular premature depolarization: Secondary | ICD-10-CM

## 2018-08-10 DIAGNOSIS — Z7689 Persons encountering health services in other specified circumstances: Secondary | ICD-10-CM | POA: Diagnosis not present

## 2018-08-10 DIAGNOSIS — Z95810 Presence of automatic (implantable) cardiac defibrillator: Secondary | ICD-10-CM

## 2018-08-10 DIAGNOSIS — I428 Other cardiomyopathies: Secondary | ICD-10-CM | POA: Diagnosis not present

## 2018-08-10 DIAGNOSIS — I5022 Chronic systolic (congestive) heart failure: Secondary | ICD-10-CM | POA: Diagnosis not present

## 2018-08-10 NOTE — Telephone Encounter (Signed)
New Message ° ° ° °Left message to confirm appt and answer covid questions  °

## 2018-08-10 NOTE — H&P (View-Only) (Signed)
/      Patient Care Team: Shirline Frees, MD as PCP - General   HPI  Heather Smith is a 85 y.o. female Seen in followup for CRT implantation for nonischemic cardiomyopathy and depressed LV function. This was accomplished fall 2014  Interval partial lead dislodgment noted after she saw Dr. Mariana Single in March and an x-ray was obtained and reviewed. This  x-ray was reviewed. We reprogrammed the device    I reviewed with her the data from her CPX 9/16 showed excellent functional capacity and her echo from last week demonstrated normal LV function  DATE TEST EF   12/14 Echo  20-25%   1/17    Echo   55-62 %   1/18    Echo  55-60 %   11/19 MYOVIEW 75% NO ischemia   1/20 Echo  50-55% 2 MR jets-overall mild     The patient denies chest pain,  nocturnal dyspnea, orthopnea or peripheral edema.  There have been no palpitations, lightheadedness or syncope.     Chronic sob  Date Cr K  8//18 1.07 4.8  10/19  1.11 4.8       DATE QRS Paced duration Flecainide  7/18 144    12/18 168 50  12/19 174 50  7/20 182 50      Past Medical History:  Diagnosis Date  . Chest pain    a. Nl cath in the 90's;  b. 04/2005: Myoview: subtle areao of ? reversibility in apical segment of anteromedial LV - ? attenuation, EF 54%;  c. 05/2012 MV: small, fixed mild apical septal perfusion defect, ? attenuation due to lbbb, no ischemia.  . Complication of anesthesia    difficult to wake up and blood pressure drops  . GERD (gastroesophageal reflux disease)   . Hypercholesterolemia   . Hypertension   . Hypothyroidism   . Left bundle branch block   . LV lead partial dislodgment 05/30/2013  . Neurogenic claudication due to lumbar spinal stenosis   . Non-ischemic cardiomyopathy ([place])    [place] 10/04/12: Normal coronary arteries, EF 25-30% with global HK.  Echocardiogram 10/05/12: EF 30-35%, diffuse HK, moderate LAE. Echocardiogram (01/2013): Lateral to septal wall dyssynchrony EF 20-25%, diffuse HK  . Osteoarthritis     "about all over" (02/22/2013)  . Spinal stenosis   . Vitamin D deficiency     Past Surgical History:  Procedure Laterality Date  . APPENDECTOMY    . BI-VENTRICULAR IMPLANTABLE CARDIOVERTER DEFIBRILLATOR N/A 02/22/2013   Procedure: BI-VENTRICULAR IMPLANTABLE CARDIOVERTER DEFIBRILLATOR  (CRT-D);  Surgeon: Deboraha Sprang, MD;  Location: [facility] CATH LAB;  Service: Cardiovascular;  Laterality: N/A;  . BLEPHAROPLASTY Bilateral   . EXCISIONAL HEMORRHOIDECTOMY    . FOOT ARTHRODESIS, MODIFIED MCBRIDE Bilateral   . KNEE ARTHROSCOPY Right   . LEFT HEART CATHETERIZATION WITH CORONARY ANGIOGRAM N/A 10/04/2012   Procedure: LEFT HEART CATHETERIZATION WITH CORONARY ANGIOGRAM;  Surgeon: Peter M Martinique, MD;  Location[facility] C[facility] LAB;  Service: Cardiovascular;  Laterality: N/A;  . LUMBAR LAMINECTOMY  11/03/2006    Bilateral 3, 4, 5 laminectomy, partial L2, decompression of the thecal sac, foraminotomy, posterolateral arthrodesis L3-S1 with autograft   -- SURGEON:  Leeroy Cha, M.D.   . TONSILLECTOMY    . TOTAL KNEE ARTHROPLASTY Left 07/15/2015   Procedure: LEFT TOTAL KNEE ARTHROPLASTY;  Surgeon: Frederik Pear, MD;  Location: [facility];  Service: Orthopedics;  Laterality: Left;  . TUBAL LIGATION    . VAGINAL HYSTERECTOMY      Current Outpatient  Medications  Medication Sig Dispense Refill  . acetaminophen (TYLENOL) 650 MG CR tablet Take 650 mg by mouth every 8 (eight) hours as needed for pain.    . Ascorbic Acid (VITAMIN C) 1000 MG tablet Take 1,000 mg by mouth daily.     . B Complex Vitamins (VITAMIN B COMPLEX PO) Take 1 tablet by mouth daily. Reported on 01/30/2015    . carvedilol (COREG) 6.25 MG tablet TAKE 1 TABLET BY MOUTH TWICE DAILY . APPOINTMENT REQUIRED FOR FUTURE REFILLS 60 tablet 0  . Coenzyme Q10 (COQ10) 100 MG CAPS Take 1 capsule by mouth daily as needed.     . DULoxetine (CYMBALTA) 30 MG capsule Take 30 mg by mouth daily.     Marland Kitchen ENTRESTO 24-26 MG TAKE 1 TABLET BY MOUTH TWICE DAILY 180 tablet 1  .  flecainide (TAMBOCOR) 100 MG tablet Take 0.5 tablets (50 mg total) by mouth 2 (two) times daily. 90 tablet 3  . furosemide (LASIX) 20 MG tablet Take 1 tablet (20 mg total) by mouth as needed for fluid (no more than TWICE PER WEEK). 30 tablet 11  . gabapentin (NEURONTIN) 100 MG capsule Take 100 mg by mouth at bedtime.    Marland Kitchen levothyroxine (SYNTHROID, LEVOTHROID) 50 MCG tablet Take 50 mcg by mouth daily.      . Magnesium Oxide (MAG-200 PO) Take 1 tablet by mouth 2 (two) times daily.     . multivitamin (THERAGRAN) per tablet Take 1 tablet by mouth daily.      . pantoprazole (PROTONIX) 40 MG tablet Take 40 mg by mouth daily.    Marland Kitchen spironolactone (ALDACTONE) 25 MG tablet Take 1 tablet (25 mg total) by mouth daily. 90 tablet 3  . tiZANidine (ZANAFLEX) 4 MG tablet Take 0.5 tablets by mouth as needed for muscle spasms.     Current Facility-Administered Medications  Medication Dose Route Frequency Provider Last Rate Last Dose  . triamcinolone acetonide (KENALOG) 10 MG/ML injection 10 mg  10 mg Other Once Regal, Norman S, DPM      . triamcinolone acetonide (KENALOG) 10 MG/ML injection 10 mg  10 mg Other Once Regal, Norman S, DPM      . triamcinolone acetonide (KENALOG) 10 MG/ML injection 10 mg  10 mg Other Once Wallene Huh, DPM        Allergies  Allergen Reactions  . Statins Other (See Comments)    "Makes her bones hurt and very sore"  . Niaspan [Niacin Er]     High doses, causes hot flashes & aching   . Benadryl [Diphenhydramine] Other (See Comments)    Makes pt feel jittery   . Dilaudid [Hydromorphone Hcl] Itching    Review of Systems negative except from HPI and PMH  Physical Exam BP 114/76   Pulse 67   Ht 5\' 8"  (1.727 m)   Wt 192 lb 9.6 oz (87.4 kg)   SpO2 94%   BMI 29.28 kg/m  Well developed and well nourished in no acute distress HENT normal Neck supple with JVP-flat Clear Device pocket well healed; without hematoma or erythema.  There is no tethering  Regular rate and rhythm,  no  gallop murmur Abd-soft with active BS No Clubbing cyanosis  edema Skin-warm and dry A & Oriented  Grossly normal sensory and motor function  ECG AV pacing with an upright QRS lead V1 and a QR V1       Assessment and  Plan  Nonischemic cardiomyopathy-- interval normalization  Congestive heart failure-chronic-diastolic  Medtronic ICD-CRT    Atrial fibrillation-paroxysmal  LV threshold change  Chronotropic incompetence  Depression   PVCs  Euvolemic continue current meds   PVCs scant  There has been interval normalization.  We have discussed whether we would replace her device with high voltage or low voltage now that she is at [facility].  She and her family is decided that they would like to proceed with CRT-D replacement of her current high-voltage system.  This is reasonable.  We have discussed risks and benefits including the risk of infection.  She is agreeable and willing to proceed.  Tolerating meds through

## 2018-08-10 NOTE — Patient Instructions (Signed)
Medication Instructions:  Your physician recommends that you continue on your current medications as directed. Please refer to the Current Medication list given to you today.  Labwork: You will have labs drawn today: CBC and BMP  Testing/Procedures: Your physician has recommended that you have a defibrillator inserted. An implantable cardioverter defibrillator (ICD) is a small device that is placed in your chest or, in rare cases, your abdomen. This device uses electrical pulses or shocks to help control life-threatening, irregular heartbeats that could lead the heart to suddenly stop beating (sudden cardiac arrest). Leads are attached to the ICD that goes into your heart. This is done in the hospital and usually requires an overnight stay. Please see the instruction sheet given to you today for more information.   Follow-Up: You have the following appointments set up:  July 15 @ 10:30 for your COVID screening  July 28 @ 10:30 with the device clinic for a surgical wound check  October 20 @ 3:00pm with Dr. Caryl Comes for your post implant follow up  Any Other Special Instructions Will Be Listed Below (If Applicable).     If you need a refill on your cardiac medications before your next appointment, please call your pharmacy.

## 2018-08-10 NOTE — Progress Notes (Signed)
/      Patient Care Team: Shirline Frees, MD as PCP - General   HPI  Heather Smith is a 85 y.o. female Seen in followup for CRT implantation for nonischemic cardiomyopathy and depressed LV function. This was accomplished fall 2014  Interval partial lead dislodgment noted after she saw Dr. Mariana Single in March and an x-ray was obtained and reviewed. This  x-ray was reviewed. We reprogrammed the device    I reviewed with her the data from her CPX 9/16 showed excellent functional capacity and her echo from last week demonstrated normal LV function  DATE TEST EF   12/14 Echo  20-25%   1/17    Echo   55-62 %   1/18    Echo  55-60 %   11/19 MYOVIEW 75% NO ischemia   1/20 Echo  50-55% 2 MR jets-overall mild     The patient denies chest pain,  nocturnal dyspnea, orthopnea or peripheral edema.  There have been no palpitations, lightheadedness or syncope.     Chronic sob  Date Cr K  8//18 1.07 4.8  10/19  1.11 4.8       DATE QRS Paced duration Flecainide  7/18 144    12/18 168 50  12/19 174 50  7/20 182 50      Past Medical History:  Diagnosis Date  . Chest pain    a. Nl cath in the 90's;  b. 04/2005: Myoview: subtle areao of ? reversibility in apical segment of anteromedial LV - ? attenuation, EF 54%;  c. 05/2012 MV: small, fixed mild apical septal perfusion defect, ? attenuation due to lbbb, no ischemia.  . Complication of anesthesia    difficult to wake up and blood pressure drops  . GERD (gastroesophageal reflux disease)   . Hypercholesterolemia   . Hypertension   . Hypothyroidism   . Left bundle branch block   . LV lead partial dislodgment 05/30/2013  . Neurogenic claudication due to lumbar spinal stenosis   . Non-ischemic cardiomyopathy ([place])    [place] 10/04/12: Normal coronary arteries, EF 25-30% with global HK.  Echocardiogram 10/05/12: EF 30-35%, diffuse HK, moderate LAE. Echocardiogram (01/2013): Lateral to septal wall dyssynchrony EF 20-25%, diffuse HK  . Osteoarthritis     "about all over" (02/22/2013)  . Spinal stenosis   . Vitamin D deficiency     Past Surgical History:  Procedure Laterality Date  . APPENDECTOMY    . BI-VENTRICULAR IMPLANTABLE CARDIOVERTER DEFIBRILLATOR N/A 02/22/2013   Procedure: BI-VENTRICULAR IMPLANTABLE CARDIOVERTER DEFIBRILLATOR  (CRT-D);  Surgeon: Deboraha Sprang, MD;  Location[facility] C[facility] LAB;  Service: Cardiovascular;  Laterality: N/A;  . BLEPHAROPLASTY Bilateral   . EXCISIONAL HEMORRHOIDECTOMY    . FOOT ARTHRODESIS, MODIFIED [place] Bilateral   . KNEE ARTHROSCOPY Right   . LEFT HEART CATHETERIZATION WITH CORONARY ANGIOGRAM N/A 10/04/2012   Procedure: LEFT HEART CATHETERIZATION WITH CORONARY ANGIOGRAM;  Surgeon: Peter M Martinique, MD;  Location: [facility] CATH LAB;  Service: Cardiovascular;  Laterality: N/A;  . LUMBAR LAMINECTOMY  11/03/2006    Bilateral 3, 4, 5 laminectomy, partial L2, decompression of the thecal sac, foraminotomy, posterolateral arthrodesis L3-S1 with autograft   -- SURGEON:  Leeroy Cha, M.D.   . TONSILLECTOMY    . TOTAL KNEE ARTHROPLASTY Left 07/15/2015   Procedure: LEFT TOTAL KNEE ARTHROPLASTY;  Surgeon: Frederik Pear, MD;  Location: [facility];  Service: Orthopedics;  Laterality: Left;  . TUBAL LIGATION    . VAGINAL HYSTERECTOMY      Current Outpatient  Medications  Medication Sig Dispense Refill  . acetaminophen (TYLENOL) 650 MG CR tablet Take 650 mg by mouth every 8 (eight) hours as needed for pain.    . Ascorbic Acid (VITAMIN C) 1000 MG tablet Take 1,000 mg by mouth daily.     . B Complex Vitamins (VITAMIN B COMPLEX PO) Take 1 tablet by mouth daily. Reported on 01/30/2015    . carvedilol (COREG) 6.25 MG tablet TAKE 1 TABLET BY MOUTH TWICE DAILY . APPOINTMENT REQUIRED FOR FUTURE REFILLS 60 tablet 0  . Coenzyme Q10 (COQ10) 100 MG CAPS Take 1 capsule by mouth daily as needed.     . DULoxetine (CYMBALTA) 30 MG capsule Take 30 mg by mouth daily.     Marland Kitchen ENTRESTO 24-26 MG TAKE 1 TABLET BY MOUTH TWICE DAILY 180 tablet 1  .  flecainide (TAMBOCOR) 100 MG tablet Take 0.5 tablets (50 mg total) by mouth 2 (two) times daily. 90 tablet 3  . furosemide (LASIX) 20 MG tablet Take 1 tablet (20 mg total) by mouth as needed for fluid (no more than TWICE PER WEEK). 30 tablet 11  . gabapentin (NEURONTIN) 100 MG capsule Take 100 mg by mouth at bedtime.    Marland Kitchen levothyroxine (SYNTHROID, LEVOTHROID) 50 MCG tablet Take 50 mcg by mouth daily.      . Magnesium Oxide (MAG-200 PO) Take 1 tablet by mouth 2 (two) times daily.     . multivitamin (THERAGRAN) per tablet Take 1 tablet by mouth daily.      . pantoprazole (PROTONIX) 40 MG tablet Take 40 mg by mouth daily.    Marland Kitchen spironolactone (ALDACTONE) 25 MG tablet Take 1 tablet (25 mg total) by mouth daily. 90 tablet 3  . tiZANidine (ZANAFLEX) 4 MG tablet Take 0.5 tablets by mouth as needed for muscle spasms.     Current Facility-Administered Medications  Medication Dose Route Frequency Provider Last Rate Last Dose  . triamcinolone acetonide (KENALOG) 10 MG/ML injection 10 mg  10 mg Other Once Regal, Norman S, DPM      . triamcinolone acetonide (KENALOG) 10 MG/ML injection 10 mg  10 mg Other Once Regal, Norman S, DPM      . triamcinolone acetonide (KENALOG) 10 MG/ML injection 10 mg  10 mg Other Once Wallene Huh, DPM        Allergies  Allergen Reactions  . Statins Other (See Comments)    "Makes her bones hurt and very sore"  . Niaspan [Niacin Er]     High doses, causes hot flashes & aching   . Benadryl [Diphenhydramine] Other (See Comments)    Makes pt feel jittery   . Dilaudid [Hydromorphone Hcl] Itching    Review of Systems negative except from HPI and PMH  Physical Exam BP 114/76   Pulse 67   Ht 5\' 8"  (1.727 m)   Wt 192 lb 9.6 oz (87.4 kg)   SpO2 94%   BMI 29.28 kg/m  Well developed and well nourished in no acute distress HENT normal Neck supple with JVP-flat Clear Device pocket well healed; without hematoma or erythema.  There is no tethering  Regular rate and rhythm,  no  gallop murmur Abd-soft with active BS No Clubbing cyanosis  edema Skin-warm and dry A & Oriented  Grossly normal sensory and motor function  ECG AV pacing with an upright QRS lead V1 and a QR V1       Assessment and  Plan  Nonischemic cardiomyopathy-- interval normalization  Congestive heart failure-chronic-diastolic  [employer] ICD-CRT    Atrial fibrillation-paroxysmal  LV threshold change  Chronotropic incompetence  Depression   PVCs  Euvolemic continue current meds   PVCs scant  There has been interval normalization.  We have discussed whether we would replace her device with high voltage or low voltage now that she is at [facility].  She and her family is decided that they would like to proceed with CRT-D replacement of her current high-voltage system.  This is reasonable.  We have discussed risks and benefits including the risk of infection.  She is agreeable and willing to proceed.  Tolerating meds through

## 2018-08-10 NOTE — Telephone Encounter (Signed)

## 2018-08-11 LAB — SPECIMEN STATUS

## 2018-08-12 LAB — BASIC METABOLIC PANEL
BUN/Creatinine Ratio: 21 (ref 12–28)
BUN: 24 mg/dL (ref 8–27)
CO2: 24 mmol/L (ref 20–29)
Calcium: 9.9 mg/dL (ref 8.7–10.3)
Chloride: 102 mmol/L (ref 96–106)
Creatinine, Ser: 1.17 mg/dL — ABNORMAL HIGH (ref 0.57–1.00)
GFR calc Af Amer: 49 mL/min/{1.73_m2} — ABNORMAL LOW (ref >59)
GFR calc non Af Amer: 43 mL/min/{1.73_m2} — ABNORMAL LOW (ref >59)
Glucose: 85 mg/dL (ref 65–99)
Potassium: 5 mmol/L (ref 3.5–5.2)
Sodium: 140 mmol/L (ref 134–144)

## 2018-08-12 LAB — CBC
Hematocrit: 40.5 % (ref 34.0–46.6)
Hemoglobin: 14.1 g/dL (ref 11.1–15.9)
MCH: 31.7 pg (ref 26.6–33.0)
MCHC: 34.8 g/dL (ref 31.5–35.7)
MCV: 91 fL (ref 79–97)
Platelets: 160 10*3/uL (ref 150–450)
RBC: 4.45 x10E6/uL (ref 3.77–5.28)
RDW: 13.5 % (ref 11.7–15.4)
WBC: 5.4 10*3/uL (ref 3.4–10.8)

## 2018-08-12 LAB — SPECIMEN STATUS REPORT

## 2018-08-17 ENCOUNTER — Other Ambulatory Visit (HOSPITAL_COMMUNITY)
Admission: RE | Admit: 2018-08-17 | Discharge: 2018-08-17 | Disposition: A | Discharge status: Home / Self Care | Payer: Medicare HMO | Source: Ambulatory Visit | Attending: Internal Medicine | Admitting: Internal Medicine

## 2018-08-17 DIAGNOSIS — Z1159 Encounter for screening for other viral diseases: Secondary | ICD-10-CM | POA: Insufficient documentation

## 2018-08-17 LAB — SARS CORONAVIRUS 2 (TAT 6-24 HRS): SARS Coronavirus 2: NEGATIVE

## 2018-08-18 LAB — CUP PACEART INCLINIC DEVICE CHECK
Battery Remaining Longevity: 1 mo — CL
Battery Voltage: 2.67 V
Brady Statistic AP VP Percent: 44.76 %
Brady Statistic AP VS Percent: 0.62 %
Brady Statistic AS VP Percent: 53.79 %
Brady Statistic AS VS Percent: 0.84 %
Brady Statistic RA Percent Paced: 45.28 %
Brady Statistic RV Percent Paced: 98.17 %
Date Time Interrogation Session: 20200708150536
HighPow Impedance: 73 Ohm
Implantable Lead Implant Date: 20150121
Implantable Lead Implant Date: 20150121
Implantable Lead Implant Date: 20150121
Implantable Lead Location: 753858
Implantable Lead Location: 753859
Implantable Lead Location: 753860
Implantable Lead Model: 4298
Implantable Lead Model: 5076
Implantable Pulse Generator Implant Date: 20150121
Lead Channel Impedance Value: 342 Ohm
Lead Channel Impedance Value: 456 Ohm
Lead Channel Impedance Value: 494 Ohm
Lead Channel Impedance Value: 494 Ohm
Lead Channel Impedance Value: 570 Ohm
Lead Channel Impedance Value: 589 Ohm
Lead Channel Impedance Value: 589 Ohm
Lead Channel Impedance Value: 665 Ohm
Lead Channel Impedance Value: 703 Ohm
Lead Channel Impedance Value: 703 Ohm
Lead Channel Impedance Value: 779 Ohm
Lead Channel Impedance Value: 836 Ohm
Lead Channel Impedance Value: 855 Ohm
Lead Channel Pacing Threshold Amplitude: 0.5 V
Lead Channel Pacing Threshold Amplitude: 0.875 V
Lead Channel Pacing Threshold Amplitude: 2.5 V
Lead Channel Pacing Threshold Pulse Width: 0.4 ms
Lead Channel Pacing Threshold Pulse Width: 0.4 ms
Lead Channel Pacing Threshold Pulse Width: 0.7 ms
Lead Channel Sensing Intrinsic Amplitude: 15.875 mV
Lead Channel Sensing Intrinsic Amplitude: 18.25 mV
Lead Channel Sensing Intrinsic Amplitude: 2.75 mV
Lead Channel Sensing Intrinsic Amplitude: 3.625 mV
Lead Channel Setting Pacing Amplitude: 1.75 V
Lead Channel Setting Pacing Amplitude: 2 V
Lead Channel Setting Pacing Amplitude: 2.5 V
Lead Channel Setting Pacing Pulse Width: 0.4 ms
Lead Channel Setting Pacing Pulse Width: 1 ms
Lead Channel Setting Sensing Sensitivity: 0.3 mV

## 2018-08-19 ENCOUNTER — Encounter (HOSPITAL_COMMUNITY): Payer: Self-pay | Admitting: Internal Medicine

## 2018-08-19 ENCOUNTER — Other Ambulatory Visit: Payer: Self-pay

## 2018-08-19 ENCOUNTER — Ambulatory Visit (HOSPITAL_COMMUNITY)
Admission: RE | Admit: 2018-08-19 | Discharge: 2018-08-19 | Disposition: A | Discharge status: Home / Self Care | Payer: Medicare HMO | Attending: Internal Medicine | Admitting: Internal Medicine

## 2018-08-19 ENCOUNTER — Ambulatory Visit (HOSPITAL_COMMUNITY)
Admission: RE | Disposition: A | Discharge status: Home / Self Care | Payer: Medicare HMO | Source: Home / Self Care | Attending: Internal Medicine

## 2018-08-19 DIAGNOSIS — E559 Vitamin D deficiency, unspecified: Secondary | ICD-10-CM | POA: Insufficient documentation

## 2018-08-19 DIAGNOSIS — I5032 Chronic diastolic (congestive) heart failure: Secondary | ICD-10-CM | POA: Diagnosis not present

## 2018-08-19 DIAGNOSIS — M1990 Unspecified osteoarthritis, unspecified site: Secondary | ICD-10-CM | POA: Diagnosis not present

## 2018-08-19 DIAGNOSIS — I4589 Other specified conduction disorders: Secondary | ICD-10-CM | POA: Insufficient documentation

## 2018-08-19 DIAGNOSIS — E039 Hypothyroidism, unspecified: Secondary | ICD-10-CM | POA: Insufficient documentation

## 2018-08-19 DIAGNOSIS — I480 Paroxysmal atrial fibrillation: Secondary | ICD-10-CM | POA: Diagnosis not present

## 2018-08-19 DIAGNOSIS — Z79890 Hormone replacement therapy (postmenopausal): Secondary | ICD-10-CM | POA: Insufficient documentation

## 2018-08-19 DIAGNOSIS — Z96652 Presence of left artificial knee joint: Secondary | ICD-10-CM | POA: Diagnosis not present

## 2018-08-19 DIAGNOSIS — E7800 Pure hypercholesterolemia, unspecified: Secondary | ICD-10-CM | POA: Insufficient documentation

## 2018-08-19 DIAGNOSIS — I428 Other cardiomyopathies: Secondary | ICD-10-CM

## 2018-08-19 DIAGNOSIS — Z90710 Acquired absence of both cervix and uterus: Secondary | ICD-10-CM | POA: Diagnosis not present

## 2018-08-19 DIAGNOSIS — Z95810 Presence of automatic (implantable) cardiac defibrillator: Secondary | ICD-10-CM

## 2018-08-19 DIAGNOSIS — Z79899 Other long term (current) drug therapy: Secondary | ICD-10-CM | POA: Diagnosis not present

## 2018-08-19 DIAGNOSIS — I110 Hypertensive heart disease with heart failure: Secondary | ICD-10-CM | POA: Insufficient documentation

## 2018-08-19 DIAGNOSIS — I447 Left bundle-branch block, unspecified: Secondary | ICD-10-CM | POA: Insufficient documentation

## 2018-08-19 DIAGNOSIS — Z888 Allergy status to other drugs, medicaments and biological substances status: Secondary | ICD-10-CM | POA: Insufficient documentation

## 2018-08-19 DIAGNOSIS — Z885 Allergy status to narcotic agent status: Secondary | ICD-10-CM | POA: Insufficient documentation

## 2018-08-19 DIAGNOSIS — Z4502 Encounter for adjustment and management of automatic implantable cardiac defibrillator: Secondary | ICD-10-CM | POA: Insufficient documentation

## 2018-08-19 DIAGNOSIS — K219 Gastro-esophageal reflux disease without esophagitis: Secondary | ICD-10-CM | POA: Diagnosis not present

## 2018-08-19 DIAGNOSIS — Z006 Encounter for examination for normal comparison and control in clinical research program: Secondary | ICD-10-CM | POA: Insufficient documentation

## 2018-08-19 HISTORY — PX: BIV ICD GENERATOR CHANGEOUT: EP1194

## 2018-08-19 LAB — SURGICAL PCR SCREEN
MRSA, PCR: NEGATIVE
Staphylococcus aureus: NEGATIVE

## 2018-08-19 SURGERY — BIV ICD GENERATOR CHANGEOUT

## 2018-08-19 MED ORDER — ONDANSETRON HCL 4 MG/2ML IJ SOLN: 4.0000 mg | Freq: Four times a day (QID) | INTRAMUSCULAR | Status: DC | PRN

## 2018-08-19 MED ORDER — SODIUM CHLORIDE 0.9 % IV SOLN
INTRAVENOUS | Status: DC
  Administered 2018-08-19: 06:00:00 via INTRAVENOUS

## 2018-08-19 MED ORDER — SODIUM CHLORIDE 0.9 % IV SOLN
INTRAVENOUS | Status: AC
  Filled 2018-08-19: qty 2

## 2018-08-19 MED ORDER — SODIUM CHLORIDE 0.9 % IV SOLN
80.0000 mg | INTRAVENOUS | Status: AC
  Administered 2018-08-19: 80 mg
  Filled 2018-08-19: qty 2

## 2018-08-19 MED ORDER — FENTANYL CITRATE (PF) 100 MCG/2ML IJ SOLN
INTRAMUSCULAR | Status: DC | PRN
  Administered 2018-08-19 (×2): 12.5 ug via INTRAVENOUS

## 2018-08-19 MED ORDER — MUPIROCIN 2 % EX OINT
1.0000 "application " | TOPICAL_OINTMENT | Freq: Once | CUTANEOUS | Status: AC
  Administered 2018-08-19: 1 via TOPICAL

## 2018-08-19 MED ORDER — CEFAZOLIN SODIUM-DEXTROSE 2-4 GM/100ML-% IV SOLN
2.0000 g | INTRAVENOUS | Status: AC
  Administered 2018-08-19: 2 g via INTRAVENOUS
  Filled 2018-08-19: qty 100

## 2018-08-19 MED ORDER — ACETAMINOPHEN 325 MG PO TABS: 325.0000 mg | ORAL_TABLET | ORAL | Status: DC | PRN

## 2018-08-19 MED ORDER — LIDOCAINE HCL 1 % IJ SOLN
INTRAMUSCULAR | Status: AC
  Filled 2018-08-19: qty 60

## 2018-08-19 MED ORDER — CEFAZOLIN SODIUM-DEXTROSE 2-4 GM/100ML-% IV SOLN
INTRAVENOUS | Status: AC
  Filled 2018-08-19: qty 100

## 2018-08-19 MED ORDER — MIDAZOLAM HCL (PF) 5 MG/ML IJ SOLN
INTRAMUSCULAR | Status: DC | PRN
  Administered 2018-08-19 (×3): 1 mg via INTRAVENOUS

## 2018-08-19 MED ORDER — LIDOCAINE HCL (PF) 1 % IJ SOLN
INTRAMUSCULAR | Status: DC | PRN
  Administered 2018-08-19: 60 mL

## 2018-08-19 MED ORDER — SODIUM CHLORIDE 0.9 % IV SOLN: INTRAVENOUS | Status: AC

## 2018-08-19 MED ORDER — MUPIROCIN 2 % EX OINT
TOPICAL_OINTMENT | CUTANEOUS | Status: AC
  Administered 2018-08-19: 1 via TOPICAL
  Filled 2018-08-19: qty 22

## 2018-08-19 MED ORDER — MIDAZOLAM HCL 5 MG/5ML IJ SOLN
INTRAMUSCULAR | Status: AC
  Filled 2018-08-19: qty 5

## 2018-08-19 MED ORDER — FENTANYL CITRATE (PF) 100 MCG/2ML IJ SOLN
INTRAMUSCULAR | Status: AC
  Filled 2018-08-19: qty 2

## 2018-08-19 MED ORDER — CHLORHEXIDINE GLUCONATE 4 % EX LIQD: 60.0000 mL | Freq: Once | CUTANEOUS | Status: DC

## 2018-08-19 SURGICAL SUPPLY — 4 items
CABLE SURGICAL S-101-97-12 (CABLE) ×1 IMPLANT
ICD CLARIA MRI DTMA1QQ (ICD Generator) ×1 IMPLANT
PAD PRO RADIOLUCENT 2001M-C (PAD) ×1 IMPLANT
TRAY PACEMAKER INSERTION (PACKS) ×1 IMPLANT

## 2018-08-19 NOTE — Interval H&P Note (Signed)
History and Physical Interval Note:  08/19/2018 7:38 AM  [name]  has presented today for surgery, with the diagnosis of eri.  The various methods of treatment have been discussed with the patient and family. After consideration of risks, benefits and other options for treatment, the patient has consented to  Procedure(s): BIV ICD [place] (N/A) as a surgical intervention.  The patient's history has been reviewed, patient examined, no change in status, stable for surgery.  I have reviewed the patient's chart and labs.  Questions were answered to the patient's satisfaction.     Virl Axe

## 2018-08-19 NOTE — Discharge Instructions (Signed)
Dermabond will come off in next 10-14 days; if not will remove at wound check  Keep wound dry until tomorrow evening  No Driving x 4 days  Wound check in office as scheduled     Pacemaker Battery Change, Care After This sheet gives you information about how to care for yourself after your procedure. Your health care provider may also give you more specific instructions. If you have problems or questions, contact your health care provider. What can I expect after the procedure? After your procedure, it is common to have:  Pain or soreness at the site where the pacemaker was inserted.  Swelling at the site where the pacemaker was inserted. Follow these instructions at home: Incision care   Keep the incision clean and dry. ? Do not take baths, swim, or use a hot tub until your health care provider approves. ? You may shower the day after your procedure, or as directed by your health care provider. ? Pat the area dry with a clean towel. Do not rub the area. This may cause bleeding.  Follow instructions from your health care provider about how to take care of your incision. Make sure you: ? Wash your hands with soap and water before you change your bandage (dressing). If soap and water are not available, use hand sanitizer. ? Change your dressing as told by your health care provider. ? Leave skin glue in place. These skin closures may need to stay in place for 2 weeks or longer. If adhesive strip edges start to loosen and curl up, you may trim the loose edges. Do not remove adhesive strips completely unless your health care provider tells you to do that.  Check your incision area every day for signs of infection. Check for: ? More redness, swelling, or pain. ? More fluid or blood. ? Warmth. ? Pus or a bad smell. Activity  Do not lift anything that is heavier than 10 lb (4.5 kg) until your health care provider says it is okay to do so.  For the first 2 weeks, or as long as told by  your health care provider: ? Avoid lifting your left arm higher than your shoulder. ? Be gentle when you move your arms over your head. It is okay to raise your arm to comb your hair. ? Avoid strenuous exercise.  Ask your health care provider when it is okay to: ? Resume your normal activities. ? Return to work or school. ? Resume sexual activity. Eating and drinking  Eat a heart-healthy diet. This should include plenty of fresh fruits and vegetables, whole grains, low-fat dairy products, and lean protein like chicken and fish.  Limit alcohol intake to no more than 1 drink a day for non-pregnant women and 2 drinks a day for men. One drink equals 12 oz of beer, 5 oz of wine, or 1 oz of hard liquor.  Check ingredients and nutrition facts on packaged foods and beverages. Avoid the following types of food: ? Food that is high in salt (sodium). ? Food that is high in saturated fat, like full-fat dairy or red meat. ? Food that is high in trans fat, like fried food. ? Food and drinks that are high in sugar. Lifestyle  Do not use any products that contain nicotine or tobacco, such as cigarettes and e-cigarettes. If you need help quitting, ask your health care provider.  Take steps to manage and control your weight.  Get regular exercise. Aim for 150 minutes of moderate-intensity  exercise (such as walking or yoga) or 75 minutes of vigorous exercise (such as running or swimming) each week.  Manage other health problems, such as diabetes or high blood pressure. Ask your health care provider how you can manage these conditions. General instructions  Do not drive for 24 hours after your procedure if you were given a medicine to help you relax (sedative).  Take over-the-counter and prescription medicines only as told by your health care provider.  Avoid putting pressure on the area where the pacemaker was placed.  If you need an MRI after your pacemaker has been placed, be sure to tell the  health care provider who orders the MRI that you have a pacemaker.  Avoid close and prolonged exposure to electrical devices that have strong magnetic fields. These include: ? Cell phones. Avoid keeping them in a pocket near the pacemaker, and try using the ear opposite the pacemaker. ? MP3 players. ? Household appliances, like microwaves. ? Metal detectors. ? Electric generators. ? High-tension wires.  Keep all follow-up visits as directed by your health care provider. This is important. Contact a health care provider if:  You have pain at the incision site that is not relieved by over-the-counter or prescription medicines.  You have any of these around your incision site or coming from it: ? More redness, swelling, or pain. ? Fluid or blood. ? Warmth to the touch. ? Pus or a bad smell.  You have a fever.  You feel brief, occasional palpitations, light-headedness, or any symptoms that you think might be related to your heart. Get help right away if:  You experience chest pain that is different from the pain at the pacemaker site.  You develop a red streak that extends above or below the incision site.  You experience shortness of breath.  You have palpitations or an irregular heartbeat.  You have light-headedness that does not go away quickly.  You faint or have dizzy spells.  Your pulse suddenly drops or increases rapidly and does not return to normal.  You begin to gain weight and your legs and ankles swell. Summary  After your procedure, it is common to have pain, soreness, and some swelling where the pacemaker was inserted.  Make sure to keep your incision clean and dry. Follow instructions from your health care provider about how to take care of your incision.  Check your incision every day for signs of infection, such as more pain or swelling, pus or a bad smell, warmth, or leaking fluid and blood.  Avoid strenuous exercise and lifting your left arm higher than  your shoulder for 2 weeks, or as long as told by your health care provider. This information is not intended to replace advice given to you by your health care provider. Make sure you discuss any questions you have with your health care provider. Document Released: 11/09/2012 Document Revised: 01/01/2017 Document Reviewed: 12/12/2015 Elsevier Patient Education  2020 [employer].

## 2018-08-19 NOTE — Interval H&P Note (Signed)
ICD Criteria  Current LVEF:50%. Within 12 months prior to implant: Yes   Heart failure history: Yes, Class II  Cardiomyopathy history: Yes, Non-Ischemic Cardiomyopathy.  Atrial Fibrillation/Atrial Flutter: No.  Ventricular tachycardia history: No.  Cardiac arrest history: No.  History of syndromes with risk of sudden death: No.  Previous ICD: No.  Current ICD indication: Primary  PPM indication: No.  Class I or II Bradycardia indication present: No  Beta Blocker therapy for 3 or more months: Yes, prescribed.   Ace Inhibitor/ARB therapy for 3 or more months: Yes, prescribed.    I have seen Heather Smith is a 85 y.o. femalepre-procedural and has been followed for  consideration of ICD implant for primary prevention of sudden death.  The patient's chart has been reviewed and they meet criteria for ICD implant.  I have had a thorough discussion with the patient reviewing options.  The patient and their family (if available) have had opportunities to ask questions and have them answered. The patient and I have decided together through the [place] [facility] to reimplant ICD at this time.  Risks, benefits, alternatives to ICD implantation were discussed in detail with the patient today. The patient  understands that the risks include but are not limited to bleeding, infection, pneumothorax, perforation, tamponade, vascular damage, renal failure, MI, stroke, death, inappropriate shocks, and lead dislodgement and  wishes to proceed.  History and Physical Interval Note:  08/19/2018 7:39 AM  Heather Smith  has presented today for surgery, with the diagnosis of eri.  The various methods of treatment have been discussed with the patient and family. After consideration of risks, benefits and other options for treatment, the patient has consented to  Procedure(s): BIV ICD [place] (N/A) as a surgical intervention.  The patient's history has been reviewed,  patient examined, no change in status, stable for surgery.  I have reviewed the patient's chart and labs.  Questions were answered to the patient's satisfaction.     Virl Axe

## 2018-08-22 MED FILL — Lidocaine HCl Local Inj 1%: INTRAMUSCULAR | Qty: 60 | Status: AC

## 2018-08-25 ENCOUNTER — Other Ambulatory Visit (HOSPITAL_COMMUNITY): Payer: Self-pay | Admitting: Cardiology

## 2018-08-29 ENCOUNTER — Telehealth: Payer: Self-pay

## 2018-08-29 NOTE — Telephone Encounter (Signed)
    COVID-19 Pre-Screening Questions:  . In the past 7 to 10 days have you had a cough,  shortness of breath, headache, congestion, fever (100 or greater) body aches, chills, sore throat, or sudden loss of taste or sense of smell? No . Have you been around anyone with known Covid 19. . Have you been around anyone who is awaiting Covid 19 test results in the past 7 to 10 days? . Have you been around anyone who has been exposed to Covid 19, or has mentioned symptoms of Covid 19 within the past 7 to 10 days?  If you have any concerns/questions about symptoms patients report during screening (either on the phone or at threshold). Contact the provider seeing the patient or DOD for further guidance.  If neither are available contact a member of the leadership team.

## 2018-08-29 NOTE — Telephone Encounter (Signed)
Pt answered no to all covid-19 prescreening questions. I asked the pt to wear a mask for her appointment. I let the pt know we are reducing the number of people coming into the office and if she can physically come alone to please do so. The pt verbalized understanding.

## 2018-08-30 ENCOUNTER — Other Ambulatory Visit: Payer: Self-pay

## 2018-08-30 ENCOUNTER — Ambulatory Visit (INDEPENDENT_AMBULATORY_CARE_PROVIDER_SITE_OTHER): Payer: Medicare HMO | Admitting: *Deleted

## 2018-08-30 DIAGNOSIS — I5032 Chronic diastolic (congestive) heart failure: Secondary | ICD-10-CM

## 2018-08-30 LAB — CUP PACEART INCLINIC DEVICE CHECK
Battery Remaining Longevity: 85 mo
Battery Voltage: 3.11 V
Brady Statistic AP VP Percent: 27.82 %
Brady Statistic AP VS Percent: 0.45 %
Brady Statistic AS VP Percent: 70.59 %
Brady Statistic AS VS Percent: 1.14 %
Brady Statistic RA Percent Paced: 28.23 %
Brady Statistic RV Percent Paced: 98.05 %
Date Time Interrogation Session: 20200728110644
HighPow Impedance: 63 Ohm
Implantable Lead Implant Date: 20150121
Implantable Lead Implant Date: 20150121
Implantable Lead Implant Date: 20150121
Implantable Lead Location: 753858
Implantable Lead Location: 753859
Implantable Lead Location: 753860
Implantable Lead Model: 4298
Implantable Lead Model: 5076
Implantable Pulse Generator Implant Date: 20200717
Lead Channel Impedance Value: 188.1 Ohm
Lead Channel Impedance Value: 198.837
Lead Channel Impedance Value: 205.2 Ohm
Lead Channel Impedance Value: 222.34 Ohm
Lead Channel Impedance Value: 246.635
Lead Channel Impedance Value: 342 Ohm
Lead Channel Impedance Value: 418 Ohm
Lead Channel Impedance Value: 418 Ohm
Lead Channel Impedance Value: 475 Ohm
Lead Channel Impedance Value: 513 Ohm
Lead Channel Impedance Value: 532 Ohm
Lead Channel Impedance Value: 608 Ohm
Lead Channel Impedance Value: 646 Ohm
Lead Channel Impedance Value: 665 Ohm
Lead Channel Impedance Value: 722 Ohm
Lead Channel Impedance Value: 722 Ohm
Lead Channel Impedance Value: 817 Ohm
Lead Channel Impedance Value: 836 Ohm
Lead Channel Pacing Threshold Amplitude: 0.5 V
Lead Channel Pacing Threshold Amplitude: 0.875 V
Lead Channel Pacing Threshold Amplitude: 2.25 V
Lead Channel Pacing Threshold Pulse Width: 0.4 ms
Lead Channel Pacing Threshold Pulse Width: 0.4 ms
Lead Channel Pacing Threshold Pulse Width: 1 ms
Lead Channel Sensing Intrinsic Amplitude: 13.625 mV
Lead Channel Sensing Intrinsic Amplitude: 17.625 mV
Lead Channel Sensing Intrinsic Amplitude: 3.375 mV
Lead Channel Sensing Intrinsic Amplitude: 3.875 mV
Lead Channel Setting Pacing Amplitude: 1.75 V
Lead Channel Setting Pacing Amplitude: 2 V
Lead Channel Setting Pacing Amplitude: 2.5 V
Lead Channel Setting Pacing Pulse Width: 0.4 ms
Lead Channel Setting Pacing Pulse Width: 1 ms
Lead Channel Setting Sensing Sensitivity: 0.3 mV

## 2018-08-30 NOTE — Progress Notes (Signed)
Wound check appointment. Steri-strips removed. Wound without redness or edema. Incision edges approximated, wound well healed. Thresholds and sensing consistent with previous device measurements. Lead impedance trends stable over time. No mode switch episodes recorded. No ventricular arrhythmia episodes recorded. Patient bi-ventricularly pacing 98.1% of the time. Device programmed with appropriate safety margins. Heart failure diagnostics reviewed and trends are stable for patient. RV minimum adapted amplitude increased to 2.5V. Estimated longevity 7 years.  Patient enrolled in remote follow up. F/u w/ SK 11/22/18. Patient education completed including shock plan.

## 2018-09-02 DIAGNOSIS — R69 Illness, unspecified: Secondary | ICD-10-CM | POA: Diagnosis not present

## 2018-09-02 DIAGNOSIS — I6789 Other cerebrovascular disease: Secondary | ICD-10-CM | POA: Diagnosis not present

## 2018-09-02 DIAGNOSIS — M1990 Unspecified osteoarthritis, unspecified site: Secondary | ICD-10-CM | POA: Diagnosis not present

## 2018-09-02 DIAGNOSIS — I5022 Chronic systolic (congestive) heart failure: Secondary | ICD-10-CM | POA: Diagnosis not present

## 2018-09-02 DIAGNOSIS — I10 Essential (primary) hypertension: Secondary | ICD-10-CM | POA: Diagnosis not present

## 2018-09-02 DIAGNOSIS — E039 Hypothyroidism, unspecified: Secondary | ICD-10-CM | POA: Diagnosis not present

## 2018-09-02 DIAGNOSIS — N183 Chronic kidney disease, stage 3 (moderate): Secondary | ICD-10-CM | POA: Diagnosis not present

## 2018-09-02 DIAGNOSIS — E782 Mixed hyperlipidemia: Secondary | ICD-10-CM | POA: Diagnosis not present

## 2018-09-05 ENCOUNTER — Other Ambulatory Visit (HOSPITAL_COMMUNITY): Payer: Self-pay

## 2018-09-05 ENCOUNTER — Other Ambulatory Visit: Payer: Self-pay | Admitting: Internal Medicine

## 2018-09-05 MED ORDER — CARVEDILOL 6.25 MG PO TABS: ORAL_TABLET | ORAL | 0 refills | Status: DC

## 2018-09-05 MED ORDER — FLECAINIDE ACETATE 100 MG PO TABS: 50.0000 mg | ORAL_TABLET | Freq: Two times a day (BID) | ORAL | 3 refills | Status: DC

## 2018-09-05 MED ORDER — SPIRONOLACTONE 25 MG PO TABS: 25.0000 mg | ORAL_TABLET | Freq: Every day | ORAL | 3 refills | Status: DC

## 2018-09-18 ENCOUNTER — Other Ambulatory Visit: Payer: Self-pay | Admitting: Internal Medicine

## 2018-09-24 ENCOUNTER — Other Ambulatory Visit (HOSPITAL_COMMUNITY): Payer: Self-pay | Admitting: Cardiology

## 2018-09-26 ENCOUNTER — Other Ambulatory Visit (HOSPITAL_COMMUNITY): Payer: Self-pay | Admitting: *Deleted

## 2018-09-26 ENCOUNTER — Other Ambulatory Visit (HOSPITAL_COMMUNITY): Payer: Self-pay | Admitting: Cardiology

## 2018-09-26 ENCOUNTER — Telehealth: Payer: Self-pay | Admitting: Internal Medicine

## 2018-09-26 MED ORDER — FUROSEMIDE 20 MG PO TABS: 20.0000 mg | ORAL_TABLET | ORAL | 11 refills | Status: DC | PRN

## 2018-09-26 NOTE — Telephone Encounter (Signed)
New Message    *STAT* If patient is at the pharmacy, call can be transferred to refill team.   1. Which medications need to be refilled? (please list name of each medication and dose if known) furosemide (LASIX) 20 MG tablet  2. Which pharmacy/location (including street and city if local pharmacy) is medication to be sent to? [facility] - [place], [place] - [address]  3. Do they need a 30 day or 90 day supply? 90 day

## 2018-09-26 NOTE — Telephone Encounter (Signed)
This is a CHF pt 

## 2018-09-30 DIAGNOSIS — I6789 Other cerebrovascular disease: Secondary | ICD-10-CM | POA: Diagnosis not present

## 2018-09-30 DIAGNOSIS — N183 Chronic kidney disease, stage 3 (moderate): Secondary | ICD-10-CM | POA: Diagnosis not present

## 2018-09-30 DIAGNOSIS — E039 Hypothyroidism, unspecified: Secondary | ICD-10-CM | POA: Diagnosis not present

## 2018-09-30 DIAGNOSIS — I5022 Chronic systolic (congestive) heart failure: Secondary | ICD-10-CM | POA: Diagnosis not present

## 2018-09-30 DIAGNOSIS — M1990 Unspecified osteoarthritis, unspecified site: Secondary | ICD-10-CM | POA: Diagnosis not present

## 2018-09-30 DIAGNOSIS — I5020 Unspecified systolic (congestive) heart failure: Secondary | ICD-10-CM | POA: Diagnosis not present

## 2018-09-30 DIAGNOSIS — E782 Mixed hyperlipidemia: Secondary | ICD-10-CM | POA: Diagnosis not present

## 2018-09-30 DIAGNOSIS — R69 Illness, unspecified: Secondary | ICD-10-CM | POA: Diagnosis not present

## 2018-09-30 DIAGNOSIS — I10 Essential (primary) hypertension: Secondary | ICD-10-CM | POA: Diagnosis not present

## 2018-10-05 DIAGNOSIS — R69 Illness, unspecified: Secondary | ICD-10-CM | POA: Diagnosis not present

## 2018-10-05 DIAGNOSIS — N183 Chronic kidney disease, stage 3 (moderate): Secondary | ICD-10-CM | POA: Diagnosis not present

## 2018-10-05 DIAGNOSIS — I6789 Other cerebrovascular disease: Secondary | ICD-10-CM | POA: Diagnosis not present

## 2018-10-05 DIAGNOSIS — I10 Essential (primary) hypertension: Secondary | ICD-10-CM | POA: Diagnosis not present

## 2018-10-05 DIAGNOSIS — E782 Mixed hyperlipidemia: Secondary | ICD-10-CM | POA: Diagnosis not present

## 2018-10-05 DIAGNOSIS — I5020 Unspecified systolic (congestive) heart failure: Secondary | ICD-10-CM | POA: Diagnosis not present

## 2018-10-05 DIAGNOSIS — E039 Hypothyroidism, unspecified: Secondary | ICD-10-CM | POA: Diagnosis not present

## 2018-10-05 DIAGNOSIS — I5022 Chronic systolic (congestive) heart failure: Secondary | ICD-10-CM | POA: Diagnosis not present

## 2018-10-05 DIAGNOSIS — M1990 Unspecified osteoarthritis, unspecified site: Secondary | ICD-10-CM | POA: Diagnosis not present

## 2018-10-07 ENCOUNTER — Telehealth (HOSPITAL_COMMUNITY): Payer: Self-pay | Admitting: *Deleted

## 2018-10-07 ENCOUNTER — Telehealth (HOSPITAL_COMMUNITY): Payer: Self-pay

## 2018-10-07 NOTE — Telephone Encounter (Signed)
Take Lasix 20 mg daily x 5 days then back to prn.  Make sure she has followup.

## 2018-10-07 NOTE — Telephone Encounter (Signed)
Called pt and she c/o swelling in feet and ankles also c/o shortness of breath. No other complaints at this time. Pts weight is up 2lbs and she is taking all medications as prescribed. Pt has f/u office visit on 9/9.  Routed to [place] for advice

## 2018-10-07 NOTE — Telephone Encounter (Signed)
Sent staff message with information to Triage.

## 2018-10-07 NOTE — Telephone Encounter (Signed)
-----   Message from Scarlette Calico, RN sent at 10/07/2018  9:33 AM EDT ----- Please f/u with pt, thanks ----- Message ----- From: May, Gina S Sent: 10/07/2018   9:01 AM EDT To: [facility] Triage Pool  Good Morning!  PT has contacted our office with concerns of possible fluid build up.  She states that she is having issues with swelling in her legs and feet.  PT has asked to be contacted @ [phone] by the end of business today.  Thanks!

## 2018-10-07 NOTE — Telephone Encounter (Signed)
Pt aware and agreeable with plan. F/u on 9/9

## 2018-10-12 ENCOUNTER — Encounter (HOSPITAL_COMMUNITY): Payer: Self-pay | Admitting: Cardiology

## 2018-10-12 ENCOUNTER — Other Ambulatory Visit: Payer: Self-pay

## 2018-10-12 ENCOUNTER — Ambulatory Visit (HOSPITAL_COMMUNITY)
Admission: RE | Admit: 2018-10-12 | Discharge: 2018-10-12 | Disposition: A | Discharge status: Home / Self Care | Payer: Medicare HMO | Source: Ambulatory Visit | Attending: Cardiology | Admitting: Cardiology

## 2018-10-12 VITALS — HR 85 | Wt 192.6 lb

## 2018-10-12 DIAGNOSIS — R69 Illness, unspecified: Secondary | ICD-10-CM | POA: Diagnosis not present

## 2018-10-12 DIAGNOSIS — I428 Other cardiomyopathies: Secondary | ICD-10-CM | POA: Diagnosis not present

## 2018-10-12 DIAGNOSIS — I493 Ventricular premature depolarization: Secondary | ICD-10-CM | POA: Diagnosis not present

## 2018-10-12 DIAGNOSIS — F329 Major depressive disorder, single episode, unspecified: Secondary | ICD-10-CM | POA: Diagnosis not present

## 2018-10-12 DIAGNOSIS — Z79890 Hormone replacement therapy (postmenopausal): Secondary | ICD-10-CM | POA: Diagnosis not present

## 2018-10-12 DIAGNOSIS — E785 Hyperlipidemia, unspecified: Secondary | ICD-10-CM | POA: Diagnosis not present

## 2018-10-12 DIAGNOSIS — Z79899 Other long term (current) drug therapy: Secondary | ICD-10-CM | POA: Insufficient documentation

## 2018-10-12 DIAGNOSIS — I5022 Chronic systolic (congestive) heart failure: Secondary | ICD-10-CM | POA: Diagnosis not present

## 2018-10-12 DIAGNOSIS — G629 Polyneuropathy, unspecified: Secondary | ICD-10-CM | POA: Insufficient documentation

## 2018-10-12 DIAGNOSIS — Z8249 Family history of ischemic heart disease and other diseases of the circulatory system: Secondary | ICD-10-CM | POA: Diagnosis not present

## 2018-10-12 LAB — BASIC METABOLIC PANEL
Anion gap: 7 (ref 5–15)
BUN: 27 mg/dL — ABNORMAL HIGH (ref 8–23)
CO2: 29 mmol/L (ref 22–32)
Calcium: 9.3 mg/dL (ref 8.9–10.3)
Chloride: 103 mmol/L (ref 98–111)
Creatinine, Ser: 1.37 mg/dL — ABNORMAL HIGH (ref 0.44–1.00)
GFR calc Af Amer: 41 mL/min — ABNORMAL LOW (ref >60)
GFR calc non Af Amer: 35 mL/min — ABNORMAL LOW (ref >60)
Glucose, Bld: 136 mg/dL — ABNORMAL HIGH (ref 70–99)
Potassium: 4.9 mmol/L (ref 3.5–5.1)
Sodium: 139 mmol/L (ref 135–145)

## 2018-10-12 NOTE — Patient Instructions (Signed)
No medication changes today!  Labs today We will only contact you if something comes back abnormal or we need to make some changes. Otherwise no news is good news!  You have been ordered a PYP Scan.  This is done in the Radiology Department of [facility].  When you come for this test please plan to be there 2-3 hours.  Your physician recommends that you schedule a follow-up appointment in: 6 months with Dr Aundra Dubin. We will call you to schedule this appointment.  At the [facility], you and your health needs are our priority. As part of our continuing mission to provide you with exceptional heart care, we have created designated Provider Care Teams. These Care Teams include your primary Cardiologist (physician) and Advanced Practice Providers (APPs- Physician Assistants and Nurse Practitioners) who all work together to provide you with the care you need, when you need it.   You may see any of the following providers on your designated Care Team at your next follow up: Marland Kitchen Dr Glori Bickers . Dr Loralie Champagne . Darrick Grinder, NP   Please be sure to bring in all your medications bottles to every appointment.

## 2018-10-13 NOTE — Progress Notes (Signed)
Patient ID: Heather Smith, female   DOB: 1932/02/28, 85 y.o.   MRN: [id number] PCP: Dr. Kenton Kingfisher HF Cardiology: Aundra Dubin  85 yo with history of nonischemic cardiomyopathy and chronic systolic HF.  In 9/14, she was admitted to [facility] with chest pain.  Cardiac cath was done, showing no significant coronary disease.  However, echo showed EF 30-35%.  She was started on meds for CHF, and echo was repeated in 12/14, EF was read as 20-25%.  She had a Medtonic CRT-D device placed (had baseline LBBB).  In 3/15, the LV lead was not capturing properly so device had to be reprogrammed.  She had AV optimization in 5/15.  Limited echo at that time showed improvement in EF to 40-45%.  CPX in 5/15 was submaximal but suggested low normal to mildly reduced functional capacity. Last echo in 6/16 showed EF 35-40% with diffuse hypokinesis.  CPX was done in 9/16.  This actually showed good functional capacity. She had repeat echo in 1/17, showing EF increased to 60-65%.  Most recent echo in 8/18 showed EF 50-55% with mild LV dilation and mild MR.    She was seen by Dr. Caryl Comes for frequent PVCs (symptomatic).  She was started on flecainide.  She thinks that this has helped .   Atypical chest pain in 11/19, Cardiolite was normal.   Repeat echo in 1/20 showed EF 50-55%, moderate MR.    She returns for followup of CHF.  She is now taking Lasix 20 mg daily rather than twice weekly, she increased due to ankle edema.  She is short of breath walking up a hill, no dyspnea on flat ground.  No orthopnea/PND.  No chest pain.  No lightheadedness.  She has peripheral neuropathy of uncertain etiology that is bothersome (tingling/pain).  No palpitations.  Stable weight.   Medtronic device interrogation: >99% BiV pacing, no AF/VT, stable thoracic impedance.   Labs (9/14): TSH normal Labs (3/15): K 4.7, creatinine 1.1 Labs (5/15): K 4.9, creatinine 1.1, BNP 44 Labs (6/15): K 5.1, creatinine 1.4, pro-BNP 39 Labs (6/15): K 5.3,  creatinine 1.53 Labs (12/15): K 4.4, creatinine 1.03 Labs (5/16): K 4, creatinine 1.0, BNP 34.6 Labs (6/16): K 4.3, creatinine 1.1 Labs (9/16): K 4.4, creatinine 1.16 Labs (11/16): K 4.5, creatinine 1.16 Labs (12/16): K 4.6, creatinine 1.16, BNP 22, Mg 1.9 Labs (1/17): K 4.3, creatinine 1.34, HCT 42.4, ESR normal Labs (6/17): K 4.6, creatinine 1.23, hgb 9.9, TSH normal Labs (6/18): K 4.7, creatinine 1.09, BNP 94, hgb 13.8, TSH normal Labs (12/18): K 4.7, creatinine 0.98 Labs (10/19): BNP 25, K 4.8, creatinine 1.11 Labs (7/20): K 5, creatinine 1.17  PMH: 1. Chronic systolic CHF: Nonischemic cardiomyopathy.  LHC (9/14) with no significant coronary disease, EF 25-30% with global hypokinesis.  Echo (9/14) with EF 30-35%, moderate LV dilation, diffuse hypokinesis.  Echo (12/14) with EF 20-25%.  She does not drink ETOH.  She had Medtronic CRT-D device implantation in 1/15. Echo (5/15) with EF 40-45%, mild LVH.  CPX (5/15) was submaximal with RER 0.99, peak VO2 14, VE/VCO2 37.3; low normal functional capacity may be limited by loss of BiV pacing at higher HR and by deconditioning but interpretation somewhat limited since study was submaximal.  Echo (6/16) with EF 35-40%, severe LV dilation, diffuse hypokinesis, mild MR.  CPX (9/16) with peak VO2 17.7, VE/VCO2 slope 32.3, RER 1.11 => excellent functional capacity. Echo (11/17) with EF 60-65%.  - Echo (8/18): EF 50-55%, mildly dilated LV, mild MR.  - Echo (  1/20): EF 50-55%, moderate MR.  2. Back pain s/p lumbar laminectomy 3. OA: Knees. Left TKR in 6/17.  4. Hyperlipidemia 5. Depression 6. PVCs 7. Peripheral neuropathy: Uncertain etiology.  She is not a diabetic.  8. Lung nodule: Last CT chest in 1/19 showed stable 6 mm LLL nodule, no further followup recommended.  9. Chest pain: Cardiolite (11/19) with EF 74%, no evidence for ischemia/infarction.   SH: Lives [place], married, never smoked, no ETOH.    FH: Mother with MI at 71, possible  ischemic cardiomyopathy.  Father with cardiomyopathy, died at 30.  She has 5 brothers, none with significant coronary disease.    ROS: All systems reviewed and negative except as per HPI.   Current Outpatient Medications  Medication Sig Dispense Refill  . acetaminophen (TYLENOL) 325 MG tablet Take 325 mg by mouth every 6 (six) hours as needed for moderate pain or headache.    . Ascorbic Acid (VITAMIN C) 1000 MG tablet Take 1,000 mg by mouth daily.     . carvedilol (COREG) 6.25 MG tablet TAKE 1 TABLET BY MOUTH TWICE DAILY (APPT REQUIRED FOR FUTURE REFILLS) 180 tablet 0  . Coenzyme Q10-Fish Oil-Vit E (CO-Q 10 OMEGA-3 FISH OIL PO) Take 1 capsule by mouth daily.    . diphenhydramine-acetaminophen (TYLENOL PM) 25-500 MG TABS tablet Take 1 tablet by mouth at bedtime as needed (sleep).    . DULoxetine (CYMBALTA) 30 MG capsule Take 30 mg by mouth daily.     . flecainide (TAMBOCOR) 100 MG tablet Take 0.5 tablets (50 mg total) by mouth 2 (two) times daily. 90 tablet 3  . furosemide (LASIX) 20 MG tablet Take 20 mg by mouth daily.    Marland Kitchen gabapentin (NEURONTIN) 100 MG capsule Take 100 mg by mouth at bedtime.    Marland Kitchen levothyroxine (SYNTHROID, LEVOTHROID) 50 MCG tablet Take 50 mcg by mouth daily.      . Magnesium 250 MG TABS Take 250 mg by mouth daily.    . Menthol, Topical Analgesic, (ICY HOT EX) Apply 1 application topically at bedtime.    . multivitamin (THERAGRAN) per tablet Take 1 tablet by mouth daily.      . pantoprazole (PROTONIX) 40 MG tablet Take 40 mg by mouth daily.    Vladimir Faster Glycol-Propyl Glycol (SYSTANE OP) Place 1 drop into both eyes daily as needed (dry eyes).    . sacubitril-valsartan (ENTRESTO) 24-26 MG Take 1 tablet by mouth 2 (two) times daily. Needs appt for further refills 180 tablet 0  . spironolactone (ALDACTONE) 25 MG tablet Take 1 tablet (25 mg total) by mouth daily. 90 tablet 3  . tiZANidine (ZANAFLEX) 2 MG tablet Take 1 mg by mouth at bedtime.    . vitamin B-12 (CYANOCOBALAMIN)  1000 MCG tablet Take 1,000 mcg by mouth daily.     Current Facility-Administered Medications  Medication Dose Route Frequency Provider Last Rate Last Dose  . triamcinolone acetonide (KENALOG) 10 MG/ML injection 10 mg  10 mg Other Once Regal, Norman S, DPM      . triamcinolone acetonide (KENALOG) 10 MG/ML injection 10 mg  10 mg Other Once Regal, Norman S, DPM      . triamcinolone acetonide (KENALOG) 10 MG/ML injection 10 mg  10 mg Other Once Wallene Huh, [place]       Vitals:   10/12/18 1457  Pulse: 85  SpO2: 93%  Weight: 87.4 kg (192 lb 9.6 oz)   Wt Readings from Last 3 Encounters:  10/12/18 87.4 kg (192  lb 9.6 oz)  08/19/18 83.9 kg (185 lb)  08/10/18 87.4 kg (192 lb 9.6 oz)   General: NAD Neck: No JVD, no thyromegaly or thyroid nodule.  Lungs: Clear to auscultation bilaterally with normal respiratory effort. CV: Nondisplaced PMI.  Heart regular S1/S2, no S3/S4, no murmur.  No peripheral edema.  No carotid bruit.  Normal pedal pulses.  Abdomen: Soft, nontender, no hepatosplenomegaly, no distention.  Skin: Intact without lesions or rashes.  Neurologic: Alert and oriented x 3.  Psych: Normal affect. Extremities: No clubbing or cyanosis.  HEENT: Normal.   Assessment/Plan: 1. Chronic systolic CHF: Nonischemic cardiomyopathy s/p Medtronic CRT-D. EF 35-40% (6/16). She does have a family history of cardiomyopathy of uncertain etiology (father) but none of her siblings developed a cardiomyopathy.  Familial cardiomyopathy is a consideration.  Viral myocarditis is also a possible etiology.  Echo in 1/17 showed improvement in EF to 60-65%, echo in 8/18 showed EF 50-55% and echo in 1/20 showed stable EF 50-55%.  NYHA II symptoms.  She is not volume overloaded.   - Continue current Entresto, Coreg, and spironolactone.  - She can continue to take Lasix 20 mg daily.  BMET today.  2. Depression: Improved on Celexa.  3. PVCs: Followed by EP, now on flecainide. Minimal palpitations now.  - Continue  flecainide 50 mg bid.  Will need nodal blocking agent while on flecainide, she is on Coreg.  4. Peripheral neuropathy: Bothersome peripheral neuropathy of uncertain etiology.  Given CHF + peripheral neuropathy, TTR amyloidosis is a concern.   - I will arrange for PYP scan.  5. Chest pain: Atypical, has not had recently.  Normal Cardiolite in 11/19.   Followup in 6 months if PYP scan is negative.   Loralie Champagne 10/13/2018

## 2018-10-17 ENCOUNTER — Other Ambulatory Visit (HOSPITAL_COMMUNITY): Payer: Self-pay | Admitting: Cardiology

## 2018-10-21 ENCOUNTER — Telehealth (HOSPITAL_COMMUNITY): Payer: Self-pay | Admitting: Vascular Surgery

## 2018-10-21 NOTE — Telephone Encounter (Signed)
Left pt detailed  message giving PYP scan 10/28/18 @ 11 am , asked pt tto call back to confirm appt

## 2018-10-28 ENCOUNTER — Encounter (HOSPITAL_COMMUNITY)
Admission: RE | Admit: 2018-10-28 | Discharge: 2018-10-28 | Disposition: A | Discharge status: Home / Self Care | Payer: Medicare HMO | Source: Ambulatory Visit | Attending: Cardiology | Admitting: Cardiology

## 2018-10-28 ENCOUNTER — Other Ambulatory Visit: Payer: Self-pay

## 2018-10-28 DIAGNOSIS — I509 Heart failure, unspecified: Secondary | ICD-10-CM | POA: Diagnosis not present

## 2018-10-28 DIAGNOSIS — I5022 Chronic systolic (congestive) heart failure: Secondary | ICD-10-CM | POA: Diagnosis not present

## 2018-10-28 MED ORDER — TECHNETIUM TC 99M PYROPHOSPHATE
21.8000 | Freq: Once | INTRAVENOUS | Status: AC | PRN
  Administered 2018-10-28: 21.8 via INTRAVENOUS
  Filled 2018-10-28: qty 22

## 2018-10-31 ENCOUNTER — Telehealth (HOSPITAL_COMMUNITY): Payer: Self-pay

## 2018-10-31 DIAGNOSIS — I428 Other cardiomyopathies: Secondary | ICD-10-CM

## 2018-10-31 NOTE — Telephone Encounter (Signed)
-----   Message from Larey Dresser, MD sent at 10/30/2018  9:19 PM EDT ----- Findings equivocal for transthyretin amyloidosis.  I will repeat study in 3 months.

## 2018-10-31 NOTE — Telephone Encounter (Signed)
Pt aware of results. Pt verbalized understanding. Will repeat in 3 months. Orders placed.

## 2018-11-02 DIAGNOSIS — E039 Hypothyroidism, unspecified: Secondary | ICD-10-CM | POA: Diagnosis not present

## 2018-11-02 DIAGNOSIS — R69 Illness, unspecified: Secondary | ICD-10-CM | POA: Diagnosis not present

## 2018-11-02 DIAGNOSIS — E782 Mixed hyperlipidemia: Secondary | ICD-10-CM | POA: Diagnosis not present

## 2018-11-02 DIAGNOSIS — I5020 Unspecified systolic (congestive) heart failure: Secondary | ICD-10-CM | POA: Diagnosis not present

## 2018-11-02 DIAGNOSIS — I5022 Chronic systolic (congestive) heart failure: Secondary | ICD-10-CM | POA: Diagnosis not present

## 2018-11-02 DIAGNOSIS — I6789 Other cerebrovascular disease: Secondary | ICD-10-CM | POA: Diagnosis not present

## 2018-11-02 DIAGNOSIS — I10 Essential (primary) hypertension: Secondary | ICD-10-CM | POA: Diagnosis not present

## 2018-11-02 DIAGNOSIS — N183 Chronic kidney disease, stage 3 (moderate): Secondary | ICD-10-CM | POA: Diagnosis not present

## 2018-11-02 DIAGNOSIS — M1990 Unspecified osteoarthritis, unspecified site: Secondary | ICD-10-CM | POA: Diagnosis not present

## 2018-11-10 ENCOUNTER — Other Ambulatory Visit: Payer: Self-pay

## 2018-11-22 ENCOUNTER — Other Ambulatory Visit: Payer: Self-pay

## 2018-11-22 ENCOUNTER — Encounter: Payer: Self-pay | Admitting: Internal Medicine

## 2018-11-22 ENCOUNTER — Ambulatory Visit (INDEPENDENT_AMBULATORY_CARE_PROVIDER_SITE_OTHER): Payer: Medicare HMO | Admitting: Internal Medicine

## 2018-11-22 VITALS — BP 130/78 | HR 67 | Ht 68.0 in | Wt 191.0 lb

## 2018-11-22 DIAGNOSIS — I5022 Chronic systolic (congestive) heart failure: Secondary | ICD-10-CM

## 2018-11-22 DIAGNOSIS — I428 Other cardiomyopathies: Secondary | ICD-10-CM | POA: Diagnosis not present

## 2018-11-22 DIAGNOSIS — Z95810 Presence of automatic (implantable) cardiac defibrillator: Secondary | ICD-10-CM | POA: Diagnosis not present

## 2018-11-22 DIAGNOSIS — I493 Ventricular premature depolarization: Secondary | ICD-10-CM

## 2018-11-22 NOTE — Patient Instructions (Addendum)
Medication Instructions:  Your physician recommends that you continue on your current medications as directed. Please refer to the Current Medication list given to you today.  Labwork: None ordered.  Testing/Procedures: None ordered.  Follow-Up: Your physician recommends that you schedule a follow-up appointment in:   9 months with Dr. Caryl Comes  Any Other Special Instructions Will Be Listed Below (If Applicable).     If you need a refill on your cardiac medications before your next appointment, please call your pharmacy. er

## 2018-11-22 NOTE — Progress Notes (Signed)
/      Patient Care Team: Shirline Frees, MD as PCP - General   HPI  Heather Smith is a 85 y.o. female Seen in followup for CRT implantation for nonischemic cardiomyopathy and depressed LV function. This was accomplished fall 2014  Interval partial lead dislodgment noted after she saw Dr. Mariana Single in March and an x-ray was obtained and reviewed. This  x-ray was reviewed. We reprogrammed the device    I reviewed with her the data from her CPX 9/16 showed excellent functional capacity and her echo from last week demonstrated normal LV function  DATE TEST EF   12/14 Echo  20-25%   1/17    Echo   55-62 %   1/18    Echo  55-60 %   11/19 MYOVIEW 75% NO ischemia   1/20 Echo  50-55% 2 MR jets-overall mild   Recent PYP scan inclusive because of neuropathy; chronic SOB but no dyspnea  We also spoke about her husband whose dementia is worsening, forgetting his grandchildren's names    Date Cr K  8//18 1.07 4.8  10/19  1.11 4.8       DATE QRS Paced duration Flecainide  7/18 144    12/18 168 50  12/19 174 50  7/20 182 50      Past Medical History:  Diagnosis Date  . Chest pain    a. Nl cath in the 90's;  b. 04/2005: Myoview: subtle areao of ? reversibility in apical segment of anteromedial LV - ? attenuation, EF 54%;  c. 05/2012 MV: small, fixed mild apical septal perfusion defect, ? attenuation due to lbbb, no ischemia.  . Complication of anesthesia    difficult to wake up and blood pressure drops  . GERD (gastroesophageal reflux disease)   . Hypercholesterolemia   . Hypertension   . Hypothyroidism   . Left bundle branch block   . LV lead partial dislodgment 05/30/2013  . Neurogenic claudication due to lumbar spinal stenosis   . Non-ischemic cardiomyopathy ([place])    [place] 10/04/12: Normal coronary arteries, EF 25-30% with global HK.  Echocardiogram 10/05/12: EF 30-35%, diffuse HK, moderate LAE. Echocardiogram (01/2013): Lateral to septal wall dyssynchrony EF 20-25%, diffuse HK  .  Osteoarthritis    "about all over" (02/22/2013)  . Spinal stenosis   . Vitamin D deficiency     Past Surgical History:  Procedure Laterality Date  . APPENDECTOMY    . BI-VENTRICULAR IMPLANTABLE CARDIOVERTER DEFIBRILLATOR N/A 02/22/2013   Procedure: BI-VENTRICULAR IMPLANTABLE CARDIOVERTER DEFIBRILLATOR  (CRT-D);  Surgeon: Deboraha Sprang, MD;  Location: [facility] CATH LAB;  Service: Cardiovascular;  Laterality: N/A;  . BIV ICD GENERATOR CHANGEOUT N/A 08/19/2018   Procedure: BIV ICD GENERATOR CHANGEOUT;  Surgeon: Deboraha Sprang, MD;  Location: [place] CV LAB;  Service: Cardiovascular;  Laterality: N/A;  . BLEPHAROPLASTY Bilateral   . EXCISIONAL HEMORRHOIDECTOMY    . FOOT ARTHRODESIS, MODIFIED [place]BRIDE Bilateral   . KNEE ARTHROSCOPY Right   . LEFT HEART CATHETERIZATION WITH CORONARY ANGIOGRAM N/A 10/04/2012   Procedure: LEFT HEART CATHETERIZATION WITH CORONARY ANGIOGRAM;  Surgeon: Peter M Martinique, MD;  Location: [facility] CATH LAB;  Service: Cardiovascular;  Laterality: N/A;  . LUMBAR LAMINECTOMY  11/03/2006    Bilateral 3, 4, 5 laminectomy, partial L2, decompression of the thecal sac, foraminotomy, posterolateral arthrodesis L3-S1 with autograft   -- SURGEON:  Leeroy Cha, M.D.   . TONSILLECTOMY    . TOTAL KNEE ARTHROPLASTY Left 07/15/2015   Procedure: LEFT TOTAL KNEE  ARTHROPLASTY;  Surgeon: Frederik Pear, MD;  Location: [facility];  Service: Orthopedics;  Laterality: Left;  . TUBAL LIGATION    . VAGINAL HYSTERECTOMY      Current Outpatient Medications  Medication Sig Dispense Refill  . acetaminophen (TYLENOL) 325 MG tablet Take 325 mg by mouth every 6 (six) hours as needed for moderate pain or headache.    . Ascorbic Acid (VITAMIN C) 1000 MG tablet Take 1,000 mg by mouth daily.     . carvedilol (COREG) 6.25 MG tablet Take 1 tablet (6.25 mg total) by mouth 2 (two) times daily with a meal. TAKE 1 TABLET BY MOUTH TWICE DAILY (APPT REQUIRED FOR FUTURE REFILLS) 180 tablet 3  . Coenzyme Q10-Fish Oil-Vit E (CO-Q  10 OMEGA-3 FISH OIL PO) Take 1 capsule by mouth daily.    . diphenhydramine-acetaminophen (TYLENOL PM) 25-500 MG TABS tablet Take 1 tablet by mouth at bedtime as needed (sleep).    . DULoxetine (CYMBALTA) 30 MG capsule Take 30 mg by mouth daily.     . flecainide (TAMBOCOR) 100 MG tablet Take 0.5 tablets (50 mg total) by mouth 2 (two) times daily. 90 tablet 3  . furosemide (LASIX) 20 MG tablet Take 20 mg by mouth daily.    Marland Kitchen gabapentin (NEURONTIN) 100 MG capsule Take 100 mg by mouth at bedtime.    Marland Kitchen levothyroxine (SYNTHROID, LEVOTHROID) 50 MCG tablet Take 50 mcg by mouth daily.      . Magnesium 250 MG TABS Take 250 mg by mouth daily.    . Menthol, Topical Analgesic, (ICY HOT EX) Apply 1 application topically at bedtime.    . multivitamin (THERAGRAN) per tablet Take 1 tablet by mouth daily.      . pantoprazole (PROTONIX) 40 MG tablet Take 40 mg by mouth daily.    Vladimir Faster Glycol-Propyl Glycol (SYSTANE OP) Place 1 drop into both eyes daily as needed (dry eyes).    . sacubitril-valsartan (ENTRESTO) 24-26 MG Take 1 tablet by mouth 2 (two) times daily. Needs appt for further refills 180 tablet 0  . spironolactone (ALDACTONE) 25 MG tablet Take 1 tablet (25 mg total) by mouth daily. 90 tablet 3  . tiZANidine (ZANAFLEX) 2 MG tablet Take 1 mg by mouth at bedtime.    . vitamin B-12 (CYANOCOBALAMIN) 1000 MCG tablet Take 1,000 mcg by mouth daily.     Current Facility-Administered Medications  Medication Dose Route Frequency Provider Last Rate Last Dose  . triamcinolone acetonide (KENALOG) 10 MG/ML injection 10 mg  10 mg Other Once Regal, Norman S, DPM      . triamcinolone acetonide (KENALOG) 10 MG/ML injection 10 mg  10 mg Other Once Regal, Norman S, DPM      . triamcinolone acetonide (KENALOG) 10 MG/ML injection 10 mg  10 mg Other Once Wallene Huh, DPM        Allergies  Allergen Reactions  . Statins Other (See Comments)    "Makes her bones hurt and very sore"  . Niaspan [Niacin Er]     High  doses, causes hot flashes & aching   . Benadryl [Diphenhydramine] Other (See Comments)    Makes pt feel jittery   . Dilaudid [Hydromorphone Hcl] Itching    Review of Systems negative except from HPI and PMH  Physical Exam BP 130/78   Pulse 67   Ht 5\' 8"  (1.727 m)   Wt 191 lb (86.6 kg)   SpO2 92%   BMI 29.04 kg/m  .Well developed and well nourished in  no acute distress HENT normal Neck supple with JVP-flat Clear Device pocket well healed; without hematoma or erythema.  There is no tethering  Regular rate and rhythm, no *  murmur Abd-soft with active BS No Clubbing cyanosis   edema Skin-warm and dry A & Oriented  Grossly normal sensory and motor function  ECG sinus P-synchronous/ AV  pacing        Assessment and  Plan  Nonischemic cardiomyopathy-- interval normalization  Congestive heart failure-chronic-diastolic    Medtronic ICD-CRT  The patient's device was interrogated and the information was fully reviewed.  The device was reprogrammed to increased LV threshold   Atrial fibrillation-paroxysmal  LV threshold change  Chronotropic incompetence  Depression   PVCs    PVCs quiet   Euvolemic continue current meds  No intercurrent atrial fibrillation or flutter  On Anticoagulation;  No bleeding issues   Long discussion about the difficulty of her husband's dementia and the role of palliative care    Will send message to her PCP

## 2018-12-02 DIAGNOSIS — I5022 Chronic systolic (congestive) heart failure: Secondary | ICD-10-CM | POA: Diagnosis not present

## 2018-12-02 DIAGNOSIS — I6789 Other cerebrovascular disease: Secondary | ICD-10-CM | POA: Diagnosis not present

## 2018-12-02 DIAGNOSIS — E039 Hypothyroidism, unspecified: Secondary | ICD-10-CM | POA: Diagnosis not present

## 2018-12-02 DIAGNOSIS — I10 Essential (primary) hypertension: Secondary | ICD-10-CM | POA: Diagnosis not present

## 2018-12-02 DIAGNOSIS — N183 Chronic kidney disease, stage 3 unspecified: Secondary | ICD-10-CM | POA: Diagnosis not present

## 2018-12-02 DIAGNOSIS — I5020 Unspecified systolic (congestive) heart failure: Secondary | ICD-10-CM | POA: Diagnosis not present

## 2018-12-02 DIAGNOSIS — R69 Illness, unspecified: Secondary | ICD-10-CM | POA: Diagnosis not present

## 2018-12-02 DIAGNOSIS — M1990 Unspecified osteoarthritis, unspecified site: Secondary | ICD-10-CM | POA: Diagnosis not present

## 2018-12-02 DIAGNOSIS — E782 Mixed hyperlipidemia: Secondary | ICD-10-CM | POA: Diagnosis not present

## 2018-12-08 ENCOUNTER — Telehealth (HOSPITAL_COMMUNITY): Payer: Self-pay | Admitting: Pharmacy Technician

## 2018-12-08 NOTE — Telephone Encounter (Signed)
Renewed patient grant for [employer].  Patient ID: [id number] RxBin ID: [id number] PCN: [id number] Group: [id number] Eligibility Start Date: 12/02/2018 Eligibility End Date: 12/01/2019 Amount: $1,000.00  Will call patient with approval.  Charlann Boxer, CPhT

## 2018-12-13 ENCOUNTER — Telehealth (HOSPITAL_COMMUNITY): Payer: Self-pay | Admitting: Pharmacist

## 2018-12-13 MED ORDER — ENTRESTO 24-26 MG PO TABS: 1.0000 | ORAL_TABLET | Freq: Two times a day (BID) | ORAL | 3 refills | Status: DC

## 2018-12-13 NOTE — Telephone Encounter (Signed)
New Entresto prescription sent to [facility].

## 2018-12-13 NOTE — Telephone Encounter (Signed)
Called [facility] and gave grant billing information. Advised me that she is out of refills, will get Lauren to send Entresto refills for her.  Charlann Boxer, CPhT

## 2019-01-02 DIAGNOSIS — E782 Mixed hyperlipidemia: Secondary | ICD-10-CM | POA: Diagnosis not present

## 2019-01-02 DIAGNOSIS — I10 Essential (primary) hypertension: Secondary | ICD-10-CM | POA: Diagnosis not present

## 2019-01-02 DIAGNOSIS — M1990 Unspecified osteoarthritis, unspecified site: Secondary | ICD-10-CM | POA: Diagnosis not present

## 2019-01-02 DIAGNOSIS — R69 Illness, unspecified: Secondary | ICD-10-CM | POA: Diagnosis not present

## 2019-01-02 DIAGNOSIS — I5020 Unspecified systolic (congestive) heart failure: Secondary | ICD-10-CM | POA: Diagnosis not present

## 2019-01-02 DIAGNOSIS — N183 Chronic kidney disease, stage 3 unspecified: Secondary | ICD-10-CM | POA: Diagnosis not present

## 2019-01-02 DIAGNOSIS — I6789 Other cerebrovascular disease: Secondary | ICD-10-CM | POA: Diagnosis not present

## 2019-01-02 DIAGNOSIS — I5022 Chronic systolic (congestive) heart failure: Secondary | ICD-10-CM | POA: Diagnosis not present

## 2019-01-02 DIAGNOSIS — E039 Hypothyroidism, unspecified: Secondary | ICD-10-CM | POA: Diagnosis not present

## 2019-01-13 DIAGNOSIS — E039 Hypothyroidism, unspecified: Secondary | ICD-10-CM | POA: Diagnosis not present

## 2019-01-13 DIAGNOSIS — I10 Essential (primary) hypertension: Secondary | ICD-10-CM | POA: Diagnosis not present

## 2019-01-13 DIAGNOSIS — K219 Gastro-esophageal reflux disease without esophagitis: Secondary | ICD-10-CM | POA: Diagnosis not present

## 2019-01-13 DIAGNOSIS — R69 Illness, unspecified: Secondary | ICD-10-CM | POA: Diagnosis not present

## 2019-01-13 DIAGNOSIS — G6289 Other specified polyneuropathies: Secondary | ICD-10-CM | POA: Diagnosis not present

## 2019-01-13 DIAGNOSIS — I5022 Chronic systolic (congestive) heart failure: Secondary | ICD-10-CM | POA: Diagnosis not present

## 2019-01-13 DIAGNOSIS — E782 Mixed hyperlipidemia: Secondary | ICD-10-CM | POA: Diagnosis not present

## 2019-01-13 DIAGNOSIS — R7303 Prediabetes: Secondary | ICD-10-CM | POA: Diagnosis not present

## 2019-01-19 DIAGNOSIS — E039 Hypothyroidism, unspecified: Secondary | ICD-10-CM | POA: Diagnosis not present

## 2019-01-19 DIAGNOSIS — R7303 Prediabetes: Secondary | ICD-10-CM | POA: Diagnosis not present

## 2019-01-19 DIAGNOSIS — E782 Mixed hyperlipidemia: Secondary | ICD-10-CM | POA: Diagnosis not present

## 2019-01-23 ENCOUNTER — Encounter: Payer: Self-pay | Admitting: *Deleted

## 2019-01-23 LAB — CUP PACEART INCLINIC DEVICE CHECK
Battery Remaining Longevity: 64 mo
Battery Voltage: 3.04 V
Brady Statistic AP VP Percent: 46.01 %
Brady Statistic AP VS Percent: 0.64 %
Brady Statistic AS VP Percent: 52.51 %
Brady Statistic AS VS Percent: 0.85 %
Brady Statistic RA Percent Paced: 46.5 %
Brady Statistic RV Percent Paced: 98 %
Date Time Interrogation Session: 20201020204000
HighPow Impedance: 66 Ohm
Implantable Lead Implant Date: 20150121
Implantable Lead Implant Date: 20150121
Implantable Lead Implant Date: 20150121
Implantable Lead Location: 753858
Implantable Lead Location: 753859
Implantable Lead Location: 753860
Implantable Lead Model: 4298
Implantable Lead Model: 5076
Implantable Pulse Generator Implant Date: 20200717
Lead Channel Impedance Value: 204.14 Ohm
Lead Channel Impedance Value: 212.8 Ohm
Lead Channel Impedance Value: 216.848
Lead Channel Impedance Value: 222.34 Ohm
Lead Channel Impedance Value: 232.653
Lead Channel Impedance Value: 399 Ohm
Lead Channel Impedance Value: 418 Ohm
Lead Channel Impedance Value: 456 Ohm
Lead Channel Impedance Value: 456 Ohm
Lead Channel Impedance Value: 475 Ohm
Lead Channel Impedance Value: 532 Ohm
Lead Channel Impedance Value: 551 Ohm
Lead Channel Impedance Value: 665 Ohm
Lead Channel Impedance Value: 665 Ohm
Lead Channel Impedance Value: 722 Ohm
Lead Channel Impedance Value: 760 Ohm
Lead Channel Impedance Value: 760 Ohm
Lead Channel Impedance Value: 817 Ohm
Lead Channel Pacing Threshold Amplitude: 0.5 V
Lead Channel Pacing Threshold Amplitude: 0.75 V
Lead Channel Pacing Threshold Amplitude: 2.5 V
Lead Channel Pacing Threshold Pulse Width: 0.4 ms
Lead Channel Pacing Threshold Pulse Width: 0.4 ms
Lead Channel Pacing Threshold Pulse Width: 1 ms
Lead Channel Sensing Intrinsic Amplitude: 14.75 mV
Lead Channel Sensing Intrinsic Amplitude: 3 mV
Lead Channel Setting Pacing Amplitude: 1.5 V
Lead Channel Setting Pacing Amplitude: 2 V
Lead Channel Setting Pacing Amplitude: 3 V
Lead Channel Setting Pacing Pulse Width: 0.4 ms
Lead Channel Setting Pacing Pulse Width: 1 ms
Lead Channel Setting Sensing Sensitivity: 0.3 mV

## 2019-01-30 ENCOUNTER — Encounter (HOSPITAL_COMMUNITY): Payer: Medicare HMO

## 2019-02-01 DIAGNOSIS — N183 Chronic kidney disease, stage 3 unspecified: Secondary | ICD-10-CM | POA: Diagnosis not present

## 2019-02-01 DIAGNOSIS — I10 Essential (primary) hypertension: Secondary | ICD-10-CM | POA: Diagnosis not present

## 2019-02-01 DIAGNOSIS — I6789 Other cerebrovascular disease: Secondary | ICD-10-CM | POA: Diagnosis not present

## 2019-02-01 DIAGNOSIS — E782 Mixed hyperlipidemia: Secondary | ICD-10-CM | POA: Diagnosis not present

## 2019-02-01 DIAGNOSIS — I5020 Unspecified systolic (congestive) heart failure: Secondary | ICD-10-CM | POA: Diagnosis not present

## 2019-02-01 DIAGNOSIS — R69 Illness, unspecified: Secondary | ICD-10-CM | POA: Diagnosis not present

## 2019-02-01 DIAGNOSIS — M1990 Unspecified osteoarthritis, unspecified site: Secondary | ICD-10-CM | POA: Diagnosis not present

## 2019-02-01 DIAGNOSIS — I5022 Chronic systolic (congestive) heart failure: Secondary | ICD-10-CM | POA: Diagnosis not present

## 2019-02-01 DIAGNOSIS — E039 Hypothyroidism, unspecified: Secondary | ICD-10-CM | POA: Diagnosis not present

## 2019-02-06 ENCOUNTER — Other Ambulatory Visit: Payer: Self-pay

## 2019-02-06 ENCOUNTER — Encounter (HOSPITAL_COMMUNITY)
Admission: RE | Admit: 2019-02-06 | Discharge: 2019-02-06 | Disposition: A | Discharge status: Home / Self Care | Payer: Medicare HMO | Source: Ambulatory Visit | Attending: Cardiology | Admitting: Cardiology

## 2019-02-06 DIAGNOSIS — I428 Other cardiomyopathies: Secondary | ICD-10-CM | POA: Diagnosis not present

## 2019-02-06 DIAGNOSIS — I509 Heart failure, unspecified: Secondary | ICD-10-CM | POA: Diagnosis not present

## 2019-02-06 MED ORDER — TECHNETIUM TC 99M PYROPHOSPHATE
21.0000 | Freq: Once | INTRAVENOUS | Status: AC | PRN
  Administered 2019-02-06: 21 via INTRAVENOUS
  Filled 2019-02-06: qty 21

## 2019-02-23 ENCOUNTER — Ambulatory Visit (INDEPENDENT_AMBULATORY_CARE_PROVIDER_SITE_OTHER): Payer: Medicare HMO | Admitting: *Deleted

## 2019-02-23 DIAGNOSIS — Z95810 Presence of automatic (implantable) cardiac defibrillator: Secondary | ICD-10-CM

## 2019-02-24 ENCOUNTER — Telehealth: Payer: Self-pay

## 2019-02-24 LAB — CUP PACEART REMOTE DEVICE CHECK
Battery Remaining Longevity: 62 mo
Battery Voltage: 2.99 V
Brady Statistic AP VP Percent: 45.23 %
Brady Statistic AP VS Percent: 0.63 %
Brady Statistic AS VP Percent: 53.27 %
Brady Statistic AS VS Percent: 0.87 %
Brady Statistic RA Percent Paced: 45.71 %
Brady Statistic RV Percent Paced: 97.99 %
Date Time Interrogation Session: 20210122162411
HighPow Impedance: 67 Ohm
Implantable Lead Implant Date: 20150121
Implantable Lead Implant Date: 20150121
Implantable Lead Implant Date: 20150121
Implantable Lead Location: 753858
Implantable Lead Location: 753859
Implantable Lead Location: 753860
Implantable Lead Model: 4298
Implantable Lead Model: 5076
Implantable Pulse Generator Implant Date: 20200717
Lead Channel Impedance Value: 199.5 Ohm
Lead Channel Impedance Value: 216.848
Lead Channel Impedance Value: 216.848
Lead Channel Impedance Value: 216.848
Lead Channel Impedance Value: 237.5 Ohm
Lead Channel Impedance Value: 399 Ohm
Lead Channel Impedance Value: 399 Ohm
Lead Channel Impedance Value: 418 Ohm
Lead Channel Impedance Value: 475 Ohm
Lead Channel Impedance Value: 475 Ohm
Lead Channel Impedance Value: 513 Ohm
Lead Channel Impedance Value: 551 Ohm
Lead Channel Impedance Value: 608 Ohm
Lead Channel Impedance Value: 665 Ohm
Lead Channel Impedance Value: 722 Ohm
Lead Channel Impedance Value: 760 Ohm
Lead Channel Impedance Value: 779 Ohm
Lead Channel Impedance Value: 817 Ohm
Lead Channel Pacing Threshold Amplitude: 0.5 V
Lead Channel Pacing Threshold Amplitude: 0.625 V
Lead Channel Pacing Threshold Amplitude: 2.125 V
Lead Channel Pacing Threshold Pulse Width: 0.4 ms
Lead Channel Pacing Threshold Pulse Width: 0.4 ms
Lead Channel Pacing Threshold Pulse Width: 1 ms
Lead Channel Sensing Intrinsic Amplitude: 16.5 mV
Lead Channel Sensing Intrinsic Amplitude: 16.5 mV
Lead Channel Sensing Intrinsic Amplitude: 3.375 mV
Lead Channel Sensing Intrinsic Amplitude: 3.375 mV
Lead Channel Setting Pacing Amplitude: 1.5 V
Lead Channel Setting Pacing Amplitude: 2 V
Lead Channel Setting Pacing Amplitude: 3 V
Lead Channel Setting Pacing Pulse Width: 0.4 ms
Lead Channel Setting Pacing Pulse Width: 1 ms
Lead Channel Setting Sensing Sensitivity: 0.3 mV

## 2019-02-24 NOTE — Telephone Encounter (Signed)
I let the pt know we did receive the transmission. The pt thanked me for the call.

## 2019-02-24 NOTE — Telephone Encounter (Signed)
Spoke with patient to remind of missed remote transmission 

## 2019-02-27 DIAGNOSIS — I10 Essential (primary) hypertension: Secondary | ICD-10-CM | POA: Diagnosis not present

## 2019-02-27 DIAGNOSIS — I5020 Unspecified systolic (congestive) heart failure: Secondary | ICD-10-CM | POA: Diagnosis not present

## 2019-02-27 DIAGNOSIS — E782 Mixed hyperlipidemia: Secondary | ICD-10-CM | POA: Diagnosis not present

## 2019-02-27 DIAGNOSIS — I5022 Chronic systolic (congestive) heart failure: Secondary | ICD-10-CM | POA: Diagnosis not present

## 2019-02-27 DIAGNOSIS — R69 Illness, unspecified: Secondary | ICD-10-CM | POA: Diagnosis not present

## 2019-02-27 DIAGNOSIS — M1990 Unspecified osteoarthritis, unspecified site: Secondary | ICD-10-CM | POA: Diagnosis not present

## 2019-02-27 DIAGNOSIS — E039 Hypothyroidism, unspecified: Secondary | ICD-10-CM | POA: Diagnosis not present

## 2019-02-27 DIAGNOSIS — N183 Chronic kidney disease, stage 3 unspecified: Secondary | ICD-10-CM | POA: Diagnosis not present

## 2019-02-27 DIAGNOSIS — I6789 Other cerebrovascular disease: Secondary | ICD-10-CM | POA: Diagnosis not present

## 2019-02-28 DIAGNOSIS — E261 Secondary hyperaldosteronism: Secondary | ICD-10-CM | POA: Diagnosis not present

## 2019-02-28 DIAGNOSIS — R69 Illness, unspecified: Secondary | ICD-10-CM | POA: Diagnosis not present

## 2019-02-28 DIAGNOSIS — E785 Hyperlipidemia, unspecified: Secondary | ICD-10-CM | POA: Diagnosis not present

## 2019-02-28 DIAGNOSIS — I509 Heart failure, unspecified: Secondary | ICD-10-CM | POA: Diagnosis not present

## 2019-02-28 DIAGNOSIS — G4700 Insomnia, unspecified: Secondary | ICD-10-CM | POA: Diagnosis not present

## 2019-02-28 DIAGNOSIS — I110 Hypertensive heart disease with heart failure: Secondary | ICD-10-CM | POA: Diagnosis not present

## 2019-02-28 DIAGNOSIS — I471 Supraventricular tachycardia: Secondary | ICD-10-CM | POA: Diagnosis not present

## 2019-02-28 DIAGNOSIS — E669 Obesity, unspecified: Secondary | ICD-10-CM | POA: Diagnosis not present

## 2019-02-28 DIAGNOSIS — E039 Hypothyroidism, unspecified: Secondary | ICD-10-CM | POA: Diagnosis not present

## 2019-03-02 ENCOUNTER — Ambulatory Visit: Payer: Medicare HMO

## 2019-03-10 ENCOUNTER — Ambulatory Visit: Payer: Medicare HMO | Attending: Internal Medicine

## 2019-03-10 DIAGNOSIS — Z23 Encounter for immunization: Secondary | ICD-10-CM

## 2019-03-10 NOTE — Progress Notes (Signed)
   Covid-19 Vaccination Clinic  Name:  Heather Smith    MRN: [id number] DOB: [birth year]  03/10/2019  [name] was observed post Covid-19 immunization for 15 minutes without incidence. She was provided with Vaccine Information Sheet and instruction to access the V-Safe system.   [name] was instructed to call 911 with any severe reactions post vaccine: Marland Kitchen Difficulty breathing  . Swelling of your face and throat  . A fast heartbeat  . A bad rash all over your body  . Dizziness and weakness    Immunizations Administered    Name Date Dose VIS Date Route   Pfizer COVID-19 Vaccine 03/10/2019  2:20 PM 0.3 mL 01/13/2019 Intramuscular   Manufacturer: [place]   Lot: [id number]   [place]: [id number]

## 2019-03-22 DIAGNOSIS — R69 Illness, unspecified: Secondary | ICD-10-CM | POA: Diagnosis not present

## 2019-03-30 DIAGNOSIS — R69 Illness, unspecified: Secondary | ICD-10-CM | POA: Diagnosis not present

## 2019-03-30 DIAGNOSIS — I5020 Unspecified systolic (congestive) heart failure: Secondary | ICD-10-CM | POA: Diagnosis not present

## 2019-03-30 DIAGNOSIS — I6789 Other cerebrovascular disease: Secondary | ICD-10-CM | POA: Diagnosis not present

## 2019-03-30 DIAGNOSIS — E782 Mixed hyperlipidemia: Secondary | ICD-10-CM | POA: Diagnosis not present

## 2019-03-30 DIAGNOSIS — I10 Essential (primary) hypertension: Secondary | ICD-10-CM | POA: Diagnosis not present

## 2019-03-30 DIAGNOSIS — N183 Chronic kidney disease, stage 3 unspecified: Secondary | ICD-10-CM | POA: Diagnosis not present

## 2019-03-30 DIAGNOSIS — M1990 Unspecified osteoarthritis, unspecified site: Secondary | ICD-10-CM | POA: Diagnosis not present

## 2019-03-30 DIAGNOSIS — E039 Hypothyroidism, unspecified: Secondary | ICD-10-CM | POA: Diagnosis not present

## 2019-03-30 DIAGNOSIS — I5022 Chronic systolic (congestive) heart failure: Secondary | ICD-10-CM | POA: Diagnosis not present

## 2019-04-04 ENCOUNTER — Ambulatory Visit: Payer: Medicare HMO | Attending: Internal Medicine

## 2019-04-04 DIAGNOSIS — Z23 Encounter for immunization: Secondary | ICD-10-CM | POA: Insufficient documentation

## 2019-04-04 NOTE — Progress Notes (Signed)
   Covid-19 Vaccination Clinic  Name:  Heather Smith    MRN: [id number] DOB: November 28, 1932  04/04/2019  [name] was observed post Covid-19 immunization for 15 minutes without incident. She was provided with Vaccine Information Sheet and instruction to access the V-Safe system.   [name] was instructed to call 911 with any severe reactions post vaccine: Marland Kitchen Difficulty breathing  . Swelling of face and throat  . A fast heartbeat  . A bad rash all over body  . Dizziness and weakness   Immunizations Administered    Name Date Dose VIS Date Route   Pfizer COVID-19 Vaccine 04/04/2019  2:17 PM 0.3 mL 01/13/2019 Intramuscular   Manufacturer: [place]   Lot: [id number]   [place]: [id number]

## 2019-04-25 DIAGNOSIS — E782 Mixed hyperlipidemia: Secondary | ICD-10-CM | POA: Diagnosis not present

## 2019-05-02 DIAGNOSIS — I5022 Chronic systolic (congestive) heart failure: Secondary | ICD-10-CM | POA: Diagnosis not present

## 2019-05-02 DIAGNOSIS — I5020 Unspecified systolic (congestive) heart failure: Secondary | ICD-10-CM | POA: Diagnosis not present

## 2019-05-02 DIAGNOSIS — M1990 Unspecified osteoarthritis, unspecified site: Secondary | ICD-10-CM | POA: Diagnosis not present

## 2019-05-02 DIAGNOSIS — I10 Essential (primary) hypertension: Secondary | ICD-10-CM | POA: Diagnosis not present

## 2019-05-02 DIAGNOSIS — E782 Mixed hyperlipidemia: Secondary | ICD-10-CM | POA: Diagnosis not present

## 2019-05-02 DIAGNOSIS — R69 Illness, unspecified: Secondary | ICD-10-CM | POA: Diagnosis not present

## 2019-05-02 DIAGNOSIS — E039 Hypothyroidism, unspecified: Secondary | ICD-10-CM | POA: Diagnosis not present

## 2019-05-02 DIAGNOSIS — I6789 Other cerebrovascular disease: Secondary | ICD-10-CM | POA: Diagnosis not present

## 2019-05-02 DIAGNOSIS — N183 Chronic kidney disease, stage 3 unspecified: Secondary | ICD-10-CM | POA: Diagnosis not present

## 2019-05-26 ENCOUNTER — Ambulatory Visit (INDEPENDENT_AMBULATORY_CARE_PROVIDER_SITE_OTHER): Payer: Medicare HMO | Admitting: *Deleted

## 2019-05-26 DIAGNOSIS — Z95810 Presence of automatic (implantable) cardiac defibrillator: Secondary | ICD-10-CM

## 2019-05-26 LAB — CUP PACEART REMOTE DEVICE CHECK
Battery Remaining Longevity: 58 mo
Battery Voltage: 2.97 V
Brady Statistic AP VP Percent: 43.33 %
Brady Statistic AP VS Percent: 0.55 %
Brady Statistic AS VP Percent: 55.24 %
Brady Statistic AS VS Percent: 0.88 %
Brady Statistic RA Percent Paced: 43.74 %
Brady Statistic RV Percent Paced: 98.08 %
Date Time Interrogation Session: 20210423152029
HighPow Impedance: 70 Ohm
Implantable Lead Implant Date: 20150121
Implantable Lead Implant Date: 20150121
Implantable Lead Implant Date: 20150121
Implantable Lead Location: 753858
Implantable Lead Location: 753859
Implantable Lead Location: 753860
Implantable Lead Model: 4298
Implantable Lead Model: 5076
Implantable Pulse Generator Implant Date: 20200717
Lead Channel Impedance Value: 180.5 Ohm
Lead Channel Impedance Value: 201.488
Lead Channel Impedance Value: 201.488
Lead Channel Impedance Value: 201.488
Lead Channel Impedance Value: 228 Ohm
Lead Channel Impedance Value: 361 Ohm
Lead Channel Impedance Value: 361 Ohm
Lead Channel Impedance Value: 399 Ohm
Lead Channel Impedance Value: 456 Ohm
Lead Channel Impedance Value: 456 Ohm
Lead Channel Impedance Value: 513 Ohm
Lead Channel Impedance Value: 551 Ohm
Lead Channel Impedance Value: 608 Ohm
Lead Channel Impedance Value: 665 Ohm
Lead Channel Impedance Value: 703 Ohm
Lead Channel Impedance Value: 722 Ohm
Lead Channel Impedance Value: 722 Ohm
Lead Channel Impedance Value: 817 Ohm
Lead Channel Pacing Threshold Amplitude: 0.625 V
Lead Channel Pacing Threshold Amplitude: 0.625 V
Lead Channel Pacing Threshold Amplitude: 1.75 V
Lead Channel Pacing Threshold Pulse Width: 0.4 ms
Lead Channel Pacing Threshold Pulse Width: 0.4 ms
Lead Channel Pacing Threshold Pulse Width: 1 ms
Lead Channel Sensing Intrinsic Amplitude: 16.25 mV
Lead Channel Sensing Intrinsic Amplitude: 16.25 mV
Lead Channel Sensing Intrinsic Amplitude: 3.75 mV
Lead Channel Sensing Intrinsic Amplitude: 3.75 mV
Lead Channel Setting Pacing Amplitude: 1.5 V
Lead Channel Setting Pacing Amplitude: 2 V
Lead Channel Setting Pacing Amplitude: 3 V
Lead Channel Setting Pacing Pulse Width: 0.4 ms
Lead Channel Setting Pacing Pulse Width: 1 ms
Lead Channel Setting Sensing Sensitivity: 0.3 mV

## 2019-05-26 NOTE — Progress Notes (Signed)
ICD Remote  

## 2019-06-01 DIAGNOSIS — N183 Chronic kidney disease, stage 3 unspecified: Secondary | ICD-10-CM | POA: Diagnosis not present

## 2019-06-01 DIAGNOSIS — E039 Hypothyroidism, unspecified: Secondary | ICD-10-CM | POA: Diagnosis not present

## 2019-06-01 DIAGNOSIS — I10 Essential (primary) hypertension: Secondary | ICD-10-CM | POA: Diagnosis not present

## 2019-06-01 DIAGNOSIS — E782 Mixed hyperlipidemia: Secondary | ICD-10-CM | POA: Diagnosis not present

## 2019-06-01 DIAGNOSIS — M1990 Unspecified osteoarthritis, unspecified site: Secondary | ICD-10-CM | POA: Diagnosis not present

## 2019-06-01 DIAGNOSIS — R69 Illness, unspecified: Secondary | ICD-10-CM | POA: Diagnosis not present

## 2019-06-01 DIAGNOSIS — I5020 Unspecified systolic (congestive) heart failure: Secondary | ICD-10-CM | POA: Diagnosis not present

## 2019-06-01 DIAGNOSIS — I5022 Chronic systolic (congestive) heart failure: Secondary | ICD-10-CM | POA: Diagnosis not present

## 2019-06-01 DIAGNOSIS — I6789 Other cerebrovascular disease: Secondary | ICD-10-CM | POA: Diagnosis not present

## 2019-06-02 ENCOUNTER — Ambulatory Visit (HOSPITAL_COMMUNITY)
Admission: RE | Admit: 2019-06-02 | Discharge: 2019-06-02 | Disposition: A | Discharge status: Home / Self Care | Payer: Medicare HMO | Source: Ambulatory Visit | Attending: Cardiology | Admitting: Cardiology

## 2019-06-02 ENCOUNTER — Encounter (HOSPITAL_COMMUNITY): Payer: Self-pay | Admitting: Cardiology

## 2019-06-02 ENCOUNTER — Other Ambulatory Visit: Payer: Self-pay

## 2019-06-02 VITALS — BP 112/68 | HR 87 | Wt 191.2 lb

## 2019-06-02 DIAGNOSIS — I428 Other cardiomyopathies: Secondary | ICD-10-CM | POA: Insufficient documentation

## 2019-06-02 DIAGNOSIS — F329 Major depressive disorder, single episode, unspecified: Secondary | ICD-10-CM | POA: Insufficient documentation

## 2019-06-02 DIAGNOSIS — I480 Paroxysmal atrial fibrillation: Secondary | ICD-10-CM | POA: Diagnosis not present

## 2019-06-02 DIAGNOSIS — Z79899 Other long term (current) drug therapy: Secondary | ICD-10-CM | POA: Diagnosis not present

## 2019-06-02 DIAGNOSIS — I493 Ventricular premature depolarization: Secondary | ICD-10-CM | POA: Diagnosis not present

## 2019-06-02 DIAGNOSIS — Z8249 Family history of ischemic heart disease and other diseases of the circulatory system: Secondary | ICD-10-CM | POA: Insufficient documentation

## 2019-06-02 DIAGNOSIS — Z79890 Hormone replacement therapy (postmenopausal): Secondary | ICD-10-CM | POA: Diagnosis not present

## 2019-06-02 DIAGNOSIS — E785 Hyperlipidemia, unspecified: Secondary | ICD-10-CM | POA: Diagnosis not present

## 2019-06-02 DIAGNOSIS — I5022 Chronic systolic (congestive) heart failure: Secondary | ICD-10-CM | POA: Diagnosis not present

## 2019-06-02 DIAGNOSIS — R5383 Other fatigue: Secondary | ICD-10-CM | POA: Diagnosis not present

## 2019-06-02 DIAGNOSIS — G629 Polyneuropathy, unspecified: Secondary | ICD-10-CM | POA: Insufficient documentation

## 2019-06-02 DIAGNOSIS — R69 Illness, unspecified: Secondary | ICD-10-CM | POA: Diagnosis not present

## 2019-06-02 LAB — BASIC METABOLIC PANEL
Anion gap: 11 (ref 5–15)
BUN: 22 mg/dL (ref 8–23)
CO2: 26 mmol/L (ref 22–32)
Calcium: 9.6 mg/dL (ref 8.9–10.3)
Chloride: 102 mmol/L (ref 98–111)
Creatinine, Ser: 1.31 mg/dL — ABNORMAL HIGH (ref 0.44–1.00)
GFR calc Af Amer: 43 mL/min — ABNORMAL LOW (ref >60)
GFR calc non Af Amer: 37 mL/min — ABNORMAL LOW (ref >60)
Glucose, Bld: 144 mg/dL — ABNORMAL HIGH (ref 70–99)
Potassium: 4.3 mmol/L (ref 3.5–5.1)
Sodium: 139 mmol/L (ref 135–145)

## 2019-06-02 LAB — TSH: TSH: 1.393 u[IU]/mL (ref 0.350–4.500)

## 2019-06-02 LAB — CBC
HCT: 41.3 % (ref 36.0–46.0)
Hemoglobin: 13.5 g/dL (ref 12.0–15.0)
MCH: 30.8 pg (ref 26.0–34.0)
MCHC: 32.7 g/dL (ref 30.0–36.0)
MCV: 94.1 fL (ref 80.0–100.0)
Platelets: 168 10*3/uL (ref 150–400)
RBC: 4.39 MIL/uL (ref 3.87–5.11)
RDW: 12.9 % (ref 11.5–15.5)
WBC: 4.9 10*3/uL (ref 4.0–10.5)
nRBC: 0 % (ref 0.0–0.2)

## 2019-06-02 NOTE — Patient Instructions (Signed)
It was great to see you today! No medication changes are needed at this time.   Your physician has requested that you have an echocardiogram. Echocardiography is a painless test that uses sound waves to create images of your heart. It provides your doctor with information about the size and shape of your heart and how well your heart's chambers and valves are working. This procedure takes approximately one hour. There are no restrictions for this procedure.   Your physician recommends that you schedule a follow-up appointment in: 9 months with Dr Aundra Dubin -please give our office a call in November 2021 to schedule a follow up  Do the following things EVERYDAY: 1) Weigh yourself in the morning before breakfast. Write it down and keep it in a log. 2) Take your medicines as prescribed 3) Eat low salt foods-Limit salt (sodium) to 2000 mg per day.  4) Stay as active as you can everyday 5) Limit all fluids for the day to less than 2 liters  At the [facility], you and your health needs are our priority. As part of our continuing mission to provide you with exceptional heart care, we have created designated Provider Care Teams. These Care Teams include your primary Cardiologist (physician) and Advanced Practice Providers (APPs- Physician Assistants and Nurse Practitioners) who all work together to provide you with the care you need, when you need it.   You may see any of the following providers on your designated Care Team at your next follow up: Marland Kitchen Dr Glori Bickers . Dr Loralie Champagne . Darrick Grinder, NP . Lyda Jester, PA . Audry Riles, PharmD   Please be sure to bring in all your medications bottles to every appointment.

## 2019-06-03 NOTE — Progress Notes (Signed)
Patient ID: Heather Smith, female   DOB: [birth year], 86 y.o.   MRN: [id number] PCP: Dr. Kenton Kingfisher HF Cardiology: Aundra Dubin  86 y.o. with history of nonischemic cardiomyopathy and chronic systolic HF.  In 9/14, she was admitted to [facility] with chest pain.  Cardiac cath was done, showing no significant coronary disease.  However, echo showed EF 30-35%.  She was started on meds for CHF, and echo was repeated in 12/14, EF was read as 20-25%.  She had a Medtonic CRT-D device placed (had baseline LBBB).  In 3/15, the LV lead was not capturing properly so device had to be reprogrammed.  She had AV optimization in 5/15.  Limited echo at that time showed improvement in EF to 40-45%.  CPX in 5/15 was submaximal but suggested low normal to mildly reduced functional capacity. Last echo in 6/16 showed EF 35-40% with diffuse hypokinesis.  CPX was done in 9/16.  This actually showed good functional capacity. She had repeat echo in 1/17, showing EF increased to 60-65%.  Most recent echo in 8/18 showed EF 50-55% with mild LV dilation and mild MR.    She was seen by Dr. Caryl Comes for frequent PVCs (symptomatic).  She was started on flecainide.  She thinks that this has helped .   Atypical chest pain in 11/19, Cardiolite was normal.   Repeat echo in 1/20 showed EF 50-55%, moderate MR.    PYP scan in 1/21 was not suggestive of TTR amyloidosis.   She returns for followup of CHF.  She is now taking Lasix about twice a week.  No significant exertional dyspnea, just feels like her energy is poor.  Still has neuropathic pain in her feet.  No orthopnea/PND.  No chest pain.  Weight stable. She has a chronic cough and GERD symptoms. Rare palpitations noted at night.   Medtronic device interrogation: 97% BiV paced, no AF, fluid index < threshold with stable thoracic impedance.    Labs (9/14): TSH normal Labs (3/15): K 4.7, creatinine 1.1 Labs (5/15): K 4.9, creatinine 1.1, BNP 44 Labs (6/15): K 5.1, creatinine 1.4, pro-BNP  39 Labs (6/15): K 5.3, creatinine 1.53 Labs (12/15): K 4.4, creatinine 1.03 Labs (5/16): K 4, creatinine 1.0, BNP 34.6 Labs (6/16): K 4.3, creatinine 1.1 Labs (9/16): K 4.4, creatinine 1.16 Labs (11/16): K 4.5, creatinine 1.16 Labs (12/16): K 4.6, creatinine 1.16, BNP 22, Mg 1.9 Labs (1/17): K 4.3, creatinine 1.34, HCT 42.4, ESR normal Labs (6/17): K 4.6, creatinine 1.23, hgb 9.9, TSH normal Labs (6/18): K 4.7, creatinine 1.09, BNP 94, hgb 13.8, TSH normal Labs (12/18): K 4.7, creatinine 0.98 Labs (10/19): BNP 25, K 4.8, creatinine 1.11 Labs (7/20): K 5, creatinine 1.17 Labs (9/20): K 4.9, creatinine 1.37  PMH: 1. Chronic systolic CHF: Nonischemic cardiomyopathy.  LHC (9/14) with no significant coronary disease, EF 25-30% with global hypokinesis.  Echo (9/14) with EF 30-35%, moderate LV dilation, diffuse hypokinesis.  Echo (12/14) with EF 20-25%.  She does not drink ETOH.  She had Medtronic CRT-D device implantation in 1/15. Echo (5/15) with EF 40-45%, mild LVH.  CPX (5/15) was submaximal with RER 0.99, peak VO2 14, VE/VCO2 37.3; low normal functional capacity may be limited by loss of BiV pacing at higher HR and by deconditioning but interpretation somewhat limited since study was submaximal.  Echo (6/16) with EF 35-40%, severe LV dilation, diffuse hypokinesis, mild MR.  CPX (9/16) with peak VO2 17.7, VE/VCO2 slope 32.3, RER 1.11 => excellent functional capacity. Echo (11/17) with EF  60-65%.  - Echo (8/18): EF 50-55%, mildly dilated LV, mild MR.  - Echo (1/20): EF 50-55%, moderate MR.  - PYP scan (1/21): H/CL 1.0, grade 1.  Not suggestive of TTR amyloidosis.  2. Back pain s/p lumbar laminectomy 3. OA: Knees. Left TKR in 6/17.  4. Hyperlipidemia 5. Depression 6. PVCs 7. Peripheral neuropathy: Uncertain etiology.  She is not a diabetic.  8. Lung nodule: Last CT chest in 1/19 showed stable 6 mm LLL nodule, no further followup recommended.  9. Chest pain: Cardiolite (11/19) with EF 74%, no  evidence for ischemia/infarction.   SH: [place], married, never smoked, no ETOH.    FH: Mother with MI at 42, possible ischemic cardiomyopathy.  Father with cardiomyopathy, died at 68.  She has 5 brothers, none with significant coronary disease.    ROS: All systems reviewed and negative except as per HPI.   Current Outpatient Medications  Medication Sig Dispense Refill  . acetaminophen (TYLENOL) 325 MG tablet Take 325 mg by mouth every 6 (six) hours as needed for moderate pain or headache.    . Ascorbic Acid (VITAMIN C) 1000 MG tablet Take 1,000 mg by mouth daily.     . carvedilol (COREG) 6.25 MG tablet Take 1 tablet (6.25 mg total) by mouth 2 (two) times daily with a meal. TAKE 1 TABLET BY MOUTH TWICE DAILY (APPT REQUIRED FOR FUTURE REFILLS) 180 tablet 3  . Coenzyme Q10-Fish Oil-Vit E (CO-Q 10 OMEGA-3 FISH OIL PO) Take 1 capsule by mouth daily.    . diphenhydramine-acetaminophen (TYLENOL PM) 25-500 MG TABS tablet Take 1 tablet by mouth at bedtime as needed (sleep).    . DULoxetine (CYMBALTA) 30 MG capsule Take 30 mg by mouth daily.     . flecainide (TAMBOCOR) 100 MG tablet Take 0.5 tablets (50 mg total) by mouth 2 (two) times daily. 90 tablet 3  . furosemide (LASIX) 20 MG tablet Take 20 mg by mouth daily.    Marland Kitchen gabapentin (NEURONTIN) 100 MG capsule Take 100 mg by mouth at bedtime.    Marland Kitchen levothyroxine (SYNTHROID, LEVOTHROID) 50 MCG tablet Take 50 mcg by mouth daily.      . Magnesium 250 MG TABS Take 250 mg by mouth daily.    . Menthol, Topical Analgesic, (ICY HOT EX) Apply 1 application topically at bedtime.    . multivitamin (THERAGRAN) per tablet Take 1 tablet by mouth daily.      . pantoprazole (PROTONIX) 40 MG tablet Take 40 mg by mouth daily.    Vladimir Faster Glycol-Propyl Glycol (SYSTANE OP) Place 1 drop into both eyes daily as needed (dry eyes).    . rosuvastatin (CRESTOR) 10 MG tablet Take 10 mg by mouth 3 (three) times a week.    . sacubitril-valsartan (ENTRESTO) 24-26 MG  Take 1 tablet by mouth 2 (two) times daily. 180 tablet 3  . spironolactone (ALDACTONE) 25 MG tablet Take 1 tablet (25 mg total) by mouth daily. 90 tablet 3  . tiZANidine (ZANAFLEX) 2 MG tablet Take 1 mg by mouth at bedtime.    . vitamin B-12 (CYANOCOBALAMIN) 1000 MCG tablet Take 1,000 mcg by mouth daily.     Current Facility-Administered Medications  Medication Dose Route Frequency Provider Last Rate Last Admin  . triamcinolone acetonide (KENALOG) 10 MG/ML injection 10 mg  10 mg Other Once Regal, Norman S, DPM      . triamcinolone acetonide (KENALOG) 10 MG/ML injection 10 mg  10 mg Other Once Wallene Huh, DPM      .  triamcinolone acetonide (KENALOG) 10 MG/ML injection 10 mg  10 mg Other Once Wallene Huh, DPM       Vitals:   06/02/19 0916  BP: 112/68  Pulse: 87  SpO2: 96%  Weight: 86.7 kg (191 lb 3.2 oz)   Wt Readings from Last 3 Encounters:  06/02/19 86.7 kg (191 lb 3.2 oz)  11/22/18 86.6 kg (191 lb)  10/12/18 87.4 kg (192 lb 9.6 oz)   General: NAD Neck: No JVD, no thyromegaly or thyroid nodule.  Lungs: Clear to auscultation bilaterally with normal respiratory effort. CV: Nondisplaced PMI.  Heart regular S1/S2, no S3/S4, no murmur.  No peripheral edema.  No carotid bruit.  Normal pedal pulses.  Abdomen: Soft, nontender, no hepatosplenomegaly, no distention.  Skin: Intact without lesions or rashes.  Neurologic: Alert and oriented x 3.  Psych: Normal affect. Extremities: No clubbing or cyanosis.  HEENT: Normal.   Assessment/Plan: 1. Chronic systolic CHF: Nonischemic cardiomyopathy s/p Medtronic CRT-D. EF 35-40% (6/16). She does have a family history of cardiomyopathy of uncertain etiology (father) but none of her siblings developed a cardiomyopathy.  Familial cardiomyopathy is a consideration.  Viral myocarditis is also a possible etiology.  Echo in 1/17 showed improvement in EF to 60-65%, echo in 8/18 showed EF 50-55% and echo in 1/20 showed stable EF 50-55%.  PYP scan not  suggestive of TTR amyloidosis. NYHA II symptoms but prominent fatigue.  She is not volume overloaded.   - Continue current Entresto, Coreg, and spironolactone.  BMET today.  - She can continue to take Lasix 20 mg twice a week. - I will arrange for repeat echo  2. PVCs: Followed by EP, now on flecainide. Minimal palpitations now.  - Continue flecainide 50 mg bid.  Will need nodal blocking agent while on flecainide, she is on Coreg.  3. Peripheral neuropathy: Bothersome peripheral neuropathy may be related to B12 insufficiency. PYP scan not suggestive of TTR amyloidosis.                    4. Fatigue: Check CBC and TSH today.                                                          Loralie Champagne 06/03/2019

## 2019-06-08 DIAGNOSIS — R69 Illness, unspecified: Secondary | ICD-10-CM | POA: Diagnosis not present

## 2019-06-19 ENCOUNTER — Ambulatory Visit (HOSPITAL_COMMUNITY)
Admission: RE | Admit: 2019-06-19 | Discharge: 2019-06-19 | Disposition: A | Discharge status: Home / Self Care | Payer: Medicare HMO | Source: Ambulatory Visit | Attending: Cardiology | Admitting: Cardiology

## 2019-06-19 ENCOUNTER — Other Ambulatory Visit: Payer: Self-pay

## 2019-06-19 DIAGNOSIS — Z950 Presence of cardiac pacemaker: Secondary | ICD-10-CM | POA: Insufficient documentation

## 2019-06-19 DIAGNOSIS — I5022 Chronic systolic (congestive) heart failure: Secondary | ICD-10-CM | POA: Diagnosis not present

## 2019-06-19 NOTE — Progress Notes (Signed)
  Echocardiogram 2D Echocardiogram has been performed.  Heather Smith 06/19/2019, 10:52 AM

## 2019-06-20 DIAGNOSIS — M1711 Unilateral primary osteoarthritis, right knee: Secondary | ICD-10-CM | POA: Diagnosis not present

## 2019-06-20 DIAGNOSIS — Z96652 Presence of left artificial knee joint: Secondary | ICD-10-CM | POA: Diagnosis not present

## 2019-07-10 DIAGNOSIS — H2513 Age-related nuclear cataract, bilateral: Secondary | ICD-10-CM | POA: Diagnosis not present

## 2019-07-10 DIAGNOSIS — H04123 Dry eye syndrome of bilateral lacrimal glands: Secondary | ICD-10-CM | POA: Diagnosis not present

## 2019-07-10 DIAGNOSIS — H02403 Unspecified ptosis of bilateral eyelids: Secondary | ICD-10-CM | POA: Diagnosis not present

## 2019-07-20 DIAGNOSIS — I10 Essential (primary) hypertension: Secondary | ICD-10-CM | POA: Diagnosis not present

## 2019-07-20 DIAGNOSIS — R7303 Prediabetes: Secondary | ICD-10-CM | POA: Diagnosis not present

## 2019-07-20 DIAGNOSIS — I6789 Other cerebrovascular disease: Secondary | ICD-10-CM | POA: Diagnosis not present

## 2019-07-20 DIAGNOSIS — E039 Hypothyroidism, unspecified: Secondary | ICD-10-CM | POA: Diagnosis not present

## 2019-07-20 DIAGNOSIS — E782 Mixed hyperlipidemia: Secondary | ICD-10-CM | POA: Diagnosis not present

## 2019-07-20 DIAGNOSIS — G6289 Other specified polyneuropathies: Secondary | ICD-10-CM | POA: Diagnosis not present

## 2019-07-20 DIAGNOSIS — R69 Illness, unspecified: Secondary | ICD-10-CM | POA: Diagnosis not present

## 2019-07-20 DIAGNOSIS — Z95810 Presence of automatic (implantable) cardiac defibrillator: Secondary | ICD-10-CM | POA: Diagnosis not present

## 2019-07-20 DIAGNOSIS — N183 Chronic kidney disease, stage 3 unspecified: Secondary | ICD-10-CM | POA: Diagnosis not present

## 2019-08-01 DIAGNOSIS — E039 Hypothyroidism, unspecified: Secondary | ICD-10-CM | POA: Diagnosis not present

## 2019-08-01 DIAGNOSIS — I5020 Unspecified systolic (congestive) heart failure: Secondary | ICD-10-CM | POA: Diagnosis not present

## 2019-08-01 DIAGNOSIS — M1990 Unspecified osteoarthritis, unspecified site: Secondary | ICD-10-CM | POA: Diagnosis not present

## 2019-08-01 DIAGNOSIS — R69 Illness, unspecified: Secondary | ICD-10-CM | POA: Diagnosis not present

## 2019-08-01 DIAGNOSIS — E782 Mixed hyperlipidemia: Secondary | ICD-10-CM | POA: Diagnosis not present

## 2019-08-01 DIAGNOSIS — I10 Essential (primary) hypertension: Secondary | ICD-10-CM | POA: Diagnosis not present

## 2019-08-01 DIAGNOSIS — I5022 Chronic systolic (congestive) heart failure: Secondary | ICD-10-CM | POA: Diagnosis not present

## 2019-08-01 DIAGNOSIS — I6789 Other cerebrovascular disease: Secondary | ICD-10-CM | POA: Diagnosis not present

## 2019-08-01 DIAGNOSIS — N183 Chronic kidney disease, stage 3 unspecified: Secondary | ICD-10-CM | POA: Diagnosis not present

## 2019-08-11 DIAGNOSIS — Z0100 Encounter for examination of eyes and vision without abnormal findings: Secondary | ICD-10-CM | POA: Diagnosis not present

## 2019-08-24 DIAGNOSIS — E782 Mixed hyperlipidemia: Secondary | ICD-10-CM | POA: Diagnosis not present

## 2019-08-24 DIAGNOSIS — I10 Essential (primary) hypertension: Secondary | ICD-10-CM | POA: Diagnosis not present

## 2019-08-24 DIAGNOSIS — I5020 Unspecified systolic (congestive) heart failure: Secondary | ICD-10-CM | POA: Diagnosis not present

## 2019-08-24 DIAGNOSIS — R69 Illness, unspecified: Secondary | ICD-10-CM | POA: Diagnosis not present

## 2019-08-24 DIAGNOSIS — N183 Chronic kidney disease, stage 3 unspecified: Secondary | ICD-10-CM | POA: Diagnosis not present

## 2019-08-24 DIAGNOSIS — I6789 Other cerebrovascular disease: Secondary | ICD-10-CM | POA: Diagnosis not present

## 2019-08-24 DIAGNOSIS — E039 Hypothyroidism, unspecified: Secondary | ICD-10-CM | POA: Diagnosis not present

## 2019-08-24 DIAGNOSIS — I5022 Chronic systolic (congestive) heart failure: Secondary | ICD-10-CM | POA: Diagnosis not present

## 2019-08-24 DIAGNOSIS — M1990 Unspecified osteoarthritis, unspecified site: Secondary | ICD-10-CM | POA: Diagnosis not present

## 2019-09-05 ENCOUNTER — Other Ambulatory Visit (HOSPITAL_COMMUNITY): Payer: Self-pay | Admitting: *Deleted

## 2019-09-05 ENCOUNTER — Other Ambulatory Visit: Payer: Self-pay | Admitting: Internal Medicine

## 2019-09-05 ENCOUNTER — Telehealth: Payer: Self-pay | Admitting: Internal Medicine

## 2019-09-05 NOTE — Telephone Encounter (Signed)
*  STAT* If patient is at the pharmacy, call can be transferred to refill team.   1. Which medications need to be refilled? (please list name of each medication and dose if known) carvedilol (COREG) 6.25 MG tablet, flecainide (TAMBOCOR) 100 MG tablet  2. Which pharmacy/location (including street and city if local pharmacy) is medication to be sent to? [facility] - [place], [place] - [place] [address]   3. Do they need a 30 day or 90 day supply? [place]

## 2019-09-06 MED ORDER — FLECAINIDE ACETATE 100 MG PO TABS: 50.0000 mg | ORAL_TABLET | Freq: Two times a day (BID) | ORAL | 0 refills | Status: DC

## 2019-09-06 MED ORDER — CARVEDILOL 6.25 MG PO TABS: 6.2500 mg | ORAL_TABLET | Freq: Two times a day (BID) | ORAL | 0 refills | Status: DC

## 2019-09-06 NOTE — Telephone Encounter (Signed)
**Note De-Identified Talley Casco Obfuscation** Carvedilol 6.25 mg #180 no refills and Flecainide 100 mg #90 no refills e-scribed to [facility] as requested by the pt.

## 2019-09-21 ENCOUNTER — Telehealth: Payer: Self-pay

## 2019-09-21 ENCOUNTER — Ambulatory Visit (INDEPENDENT_AMBULATORY_CARE_PROVIDER_SITE_OTHER): Payer: Medicare HMO | Admitting: *Deleted

## 2019-09-21 DIAGNOSIS — I428 Other cardiomyopathies: Secondary | ICD-10-CM

## 2019-09-21 NOTE — Telephone Encounter (Signed)
PT SENT TRANSMISSION. I PUT HER ON THE CORRECT SCHEDULE.

## 2019-09-22 DIAGNOSIS — R69 Illness, unspecified: Secondary | ICD-10-CM | POA: Diagnosis not present

## 2019-09-22 DIAGNOSIS — I5022 Chronic systolic (congestive) heart failure: Secondary | ICD-10-CM | POA: Diagnosis not present

## 2019-09-22 DIAGNOSIS — I10 Essential (primary) hypertension: Secondary | ICD-10-CM | POA: Diagnosis not present

## 2019-09-22 DIAGNOSIS — E782 Mixed hyperlipidemia: Secondary | ICD-10-CM | POA: Diagnosis not present

## 2019-09-22 DIAGNOSIS — I5020 Unspecified systolic (congestive) heart failure: Secondary | ICD-10-CM | POA: Diagnosis not present

## 2019-09-22 DIAGNOSIS — N183 Chronic kidney disease, stage 3 unspecified: Secondary | ICD-10-CM | POA: Diagnosis not present

## 2019-09-22 DIAGNOSIS — I6789 Other cerebrovascular disease: Secondary | ICD-10-CM | POA: Diagnosis not present

## 2019-09-22 DIAGNOSIS — E039 Hypothyroidism, unspecified: Secondary | ICD-10-CM | POA: Diagnosis not present

## 2019-09-22 DIAGNOSIS — M1990 Unspecified osteoarthritis, unspecified site: Secondary | ICD-10-CM | POA: Diagnosis not present

## 2019-09-22 LAB — CUP PACEART REMOTE DEVICE CHECK
Battery Remaining Longevity: 52 mo
Battery Voltage: 2.97 V
Brady Statistic AP VP Percent: 55.19 %
Brady Statistic AP VS Percent: 0.43 %
Brady Statistic AS VP Percent: 43.72 %
Brady Statistic AS VS Percent: 0.66 %
Brady Statistic RA Percent Paced: 55.43 %
Brady Statistic RV Percent Paced: 98.44 %
Date Time Interrogation Session: 20210819144310
HighPow Impedance: 73 Ohm
Implantable Lead Implant Date: 20150121
Implantable Lead Implant Date: 20150121
Implantable Lead Implant Date: 20150121
Implantable Lead Location: 753858
Implantable Lead Location: 753859
Implantable Lead Location: 753860
Implantable Lead Model: 4298
Implantable Lead Model: 5076
Implantable Pulse Generator Implant Date: 20200717
Lead Channel Impedance Value: 184.154
Lead Channel Impedance Value: 188.1 Ohm
Lead Channel Impedance Value: 198.837
Lead Channel Impedance Value: 216.848
Lead Channel Impedance Value: 222.34 Ohm
Lead Channel Impedance Value: 342 Ohm
Lead Channel Impedance Value: 399 Ohm
Lead Channel Impedance Value: 418 Ohm
Lead Channel Impedance Value: 418 Ohm
Lead Channel Impedance Value: 475 Ohm
Lead Channel Impedance Value: 475 Ohm
Lead Channel Impedance Value: 551 Ohm
Lead Channel Impedance Value: 589 Ohm
Lead Channel Impedance Value: 608 Ohm
Lead Channel Impedance Value: 665 Ohm
Lead Channel Impedance Value: 722 Ohm
Lead Channel Impedance Value: 779 Ohm
Lead Channel Impedance Value: 779 Ohm
Lead Channel Pacing Threshold Amplitude: 0.5 V
Lead Channel Pacing Threshold Amplitude: 0.75 V
Lead Channel Pacing Threshold Amplitude: 2.375 V
Lead Channel Pacing Threshold Pulse Width: 0.4 ms
Lead Channel Pacing Threshold Pulse Width: 0.4 ms
Lead Channel Pacing Threshold Pulse Width: 1 ms
Lead Channel Sensing Intrinsic Amplitude: 16.125 mV
Lead Channel Sensing Intrinsic Amplitude: 16.125 mV
Lead Channel Sensing Intrinsic Amplitude: 3.375 mV
Lead Channel Sensing Intrinsic Amplitude: 3.375 mV
Lead Channel Setting Pacing Amplitude: 1.5 V
Lead Channel Setting Pacing Amplitude: 2 V
Lead Channel Setting Pacing Amplitude: 3 V
Lead Channel Setting Pacing Pulse Width: 0.4 ms
Lead Channel Setting Pacing Pulse Width: 1 ms
Lead Channel Setting Sensing Sensitivity: 0.3 mV

## 2019-09-22 NOTE — Progress Notes (Signed)
Remote ICD transmission.   

## 2019-10-05 ENCOUNTER — Other Ambulatory Visit (HOSPITAL_COMMUNITY): Payer: Self-pay | Admitting: Adult Health

## 2019-10-27 DIAGNOSIS — R69 Illness, unspecified: Secondary | ICD-10-CM | POA: Diagnosis not present

## 2019-10-27 DIAGNOSIS — E039 Hypothyroidism, unspecified: Secondary | ICD-10-CM | POA: Diagnosis not present

## 2019-10-27 DIAGNOSIS — I6789 Other cerebrovascular disease: Secondary | ICD-10-CM | POA: Diagnosis not present

## 2019-10-27 DIAGNOSIS — N183 Chronic kidney disease, stage 3 unspecified: Secondary | ICD-10-CM | POA: Diagnosis not present

## 2019-10-27 DIAGNOSIS — I10 Essential (primary) hypertension: Secondary | ICD-10-CM | POA: Diagnosis not present

## 2019-10-27 DIAGNOSIS — E782 Mixed hyperlipidemia: Secondary | ICD-10-CM | POA: Diagnosis not present

## 2019-10-27 DIAGNOSIS — M1990 Unspecified osteoarthritis, unspecified site: Secondary | ICD-10-CM | POA: Diagnosis not present

## 2019-10-27 DIAGNOSIS — I5022 Chronic systolic (congestive) heart failure: Secondary | ICD-10-CM | POA: Diagnosis not present

## 2019-10-27 DIAGNOSIS — I5020 Unspecified systolic (congestive) heart failure: Secondary | ICD-10-CM | POA: Diagnosis not present

## 2019-11-06 ENCOUNTER — Other Ambulatory Visit: Payer: Self-pay | Admitting: Internal Medicine

## 2019-11-08 MED ORDER — FLECAINIDE ACETATE 100 MG PO TABS: 50.0000 mg | ORAL_TABLET | Freq: Two times a day (BID) | ORAL | 0 refills | Status: DC

## 2019-11-17 ENCOUNTER — Telehealth (HOSPITAL_COMMUNITY): Payer: Self-pay | Admitting: Pharmacy Technician

## 2019-11-17 NOTE — Telephone Encounter (Signed)
Spoke with the patient this morning. She is in the coverage gap, so her 90 day co-pay right now is $461.77 and she has used all the funding in her PAN grant. The heart failure fund is currently closed, the patient is on the wait list.   Started an application for [employer] patient assistance. Will have application at the check in desk for her to come and sign. Will fax once signatures are obtained.

## 2019-11-22 NOTE — Telephone Encounter (Signed)
Sent in application via fax.  Will follow up.  

## 2019-11-24 NOTE — Telephone Encounter (Signed)
Advanced Heart Failure Patient Advocate Encounter   Patient was approved to receive Entresto from [employer]  Patient ID: [id number] Effective dates: 11/23/18 through 02/02/19  Spoke with patient regarding approval.  Charlann Boxer, CPhT

## 2019-11-28 ENCOUNTER — Ambulatory Visit: Payer: Medicare HMO | Attending: Internal Medicine

## 2019-11-28 DIAGNOSIS — Z23 Encounter for immunization: Secondary | ICD-10-CM

## 2019-11-28 NOTE — Progress Notes (Signed)
   Covid-19 Vaccination Clinic  Name:  KATRINNA TRAVIESO    MRN: [id number] DOB: 07-Dec-1932  11/28/2019  Ms. Greenberger was observed post Covid-19 immunization for 15 minutes without incident. She was provided with Vaccine Information Sheet and instruction to access the V-Safe system.   Ms. Reichenbach was instructed to call 911 with any severe reactions post vaccine: Marland Kitchen Difficulty breathing  . Swelling of face and throat  . A fast heartbeat  . A bad rash all over body  . Dizziness and weakness

## 2019-12-06 ENCOUNTER — Other Ambulatory Visit: Payer: Self-pay | Admitting: Internal Medicine

## 2019-12-08 DIAGNOSIS — Z23 Encounter for immunization: Secondary | ICD-10-CM | POA: Diagnosis not present

## 2019-12-21 ENCOUNTER — Ambulatory Visit (INDEPENDENT_AMBULATORY_CARE_PROVIDER_SITE_OTHER): Payer: Medicare HMO

## 2019-12-21 DIAGNOSIS — I428 Other cardiomyopathies: Secondary | ICD-10-CM

## 2019-12-24 LAB — CUP PACEART REMOTE DEVICE CHECK
Battery Remaining Longevity: 50 mo
Battery Voltage: 2.95 V
Brady Statistic AP VP Percent: 43.16 %
Brady Statistic AP VS Percent: 0.55 %
Brady Statistic AS VP Percent: 55.42 %
Brady Statistic AS VS Percent: 0.87 %
Brady Statistic RA Percent Paced: 43.62 %
Brady Statistic RV Percent Paced: 98.21 %
Date Time Interrogation Session: 20211120111249
HighPow Impedance: 79 Ohm
Implantable Lead Implant Date: 20150121
Implantable Lead Implant Date: 20150121
Implantable Lead Implant Date: 20150121
Implantable Lead Location: 753858
Implantable Lead Location: 753859
Implantable Lead Location: 753860
Implantable Lead Model: 4298
Implantable Lead Model: 5076
Implantable Pulse Generator Implant Date: 20200717
Lead Channel Impedance Value: 189.525
Lead Channel Impedance Value: 201.488
Lead Channel Impedance Value: 205.114
Lead Channel Impedance Value: 216.848
Lead Channel Impedance Value: 232.653
Lead Channel Impedance Value: 361 Ohm
Lead Channel Impedance Value: 399 Ohm
Lead Channel Impedance Value: 456 Ohm
Lead Channel Impedance Value: 456 Ohm
Lead Channel Impedance Value: 475 Ohm
Lead Channel Impedance Value: 513 Ohm
Lead Channel Impedance Value: 551 Ohm
Lead Channel Impedance Value: 589 Ohm
Lead Channel Impedance Value: 608 Ohm
Lead Channel Impedance Value: 703 Ohm
Lead Channel Impedance Value: 760 Ohm
Lead Channel Impedance Value: 779 Ohm
Lead Channel Impedance Value: 874 Ohm
Lead Channel Pacing Threshold Amplitude: 0.5 V
Lead Channel Pacing Threshold Amplitude: 0.75 V
Lead Channel Pacing Threshold Amplitude: 2.125 V
Lead Channel Pacing Threshold Pulse Width: 0.4 ms
Lead Channel Pacing Threshold Pulse Width: 0.4 ms
Lead Channel Pacing Threshold Pulse Width: 1 ms
Lead Channel Sensing Intrinsic Amplitude: 15.375 mV
Lead Channel Sensing Intrinsic Amplitude: 15.375 mV
Lead Channel Sensing Intrinsic Amplitude: 3.75 mV
Lead Channel Sensing Intrinsic Amplitude: 3.75 mV
Lead Channel Setting Pacing Amplitude: 1.5 V
Lead Channel Setting Pacing Amplitude: 2 V
Lead Channel Setting Pacing Amplitude: 3 V
Lead Channel Setting Pacing Pulse Width: 0.4 ms
Lead Channel Setting Pacing Pulse Width: 1 ms
Lead Channel Setting Sensing Sensitivity: 0.3 mV

## 2019-12-25 NOTE — Progress Notes (Signed)
Remote ICD transmission.   

## 2019-12-26 DIAGNOSIS — I6789 Other cerebrovascular disease: Secondary | ICD-10-CM | POA: Diagnosis not present

## 2019-12-26 DIAGNOSIS — E039 Hypothyroidism, unspecified: Secondary | ICD-10-CM | POA: Diagnosis not present

## 2019-12-26 DIAGNOSIS — R69 Illness, unspecified: Secondary | ICD-10-CM | POA: Diagnosis not present

## 2019-12-26 DIAGNOSIS — K219 Gastro-esophageal reflux disease without esophagitis: Secondary | ICD-10-CM | POA: Diagnosis not present

## 2019-12-26 DIAGNOSIS — M1990 Unspecified osteoarthritis, unspecified site: Secondary | ICD-10-CM | POA: Diagnosis not present

## 2019-12-26 DIAGNOSIS — I5022 Chronic systolic (congestive) heart failure: Secondary | ICD-10-CM | POA: Diagnosis not present

## 2019-12-26 DIAGNOSIS — I5020 Unspecified systolic (congestive) heart failure: Secondary | ICD-10-CM | POA: Diagnosis not present

## 2019-12-26 DIAGNOSIS — E782 Mixed hyperlipidemia: Secondary | ICD-10-CM | POA: Diagnosis not present

## 2019-12-26 DIAGNOSIS — I10 Essential (primary) hypertension: Secondary | ICD-10-CM | POA: Diagnosis not present

## 2020-01-03 ENCOUNTER — Other Ambulatory Visit (HOSPITAL_COMMUNITY): Payer: Self-pay | Admitting: *Deleted

## 2020-01-03 MED ORDER — FUROSEMIDE 20 MG PO TABS: ORAL_TABLET | ORAL | 11 refills | Status: DC

## 2020-01-04 ENCOUNTER — Other Ambulatory Visit: Payer: Self-pay | Admitting: Internal Medicine

## 2020-01-04 NOTE — Telephone Encounter (Signed)
[facility] calling to check on the status of this refill request. Let them know it was currently pending.

## 2020-01-09 DIAGNOSIS — G629 Polyneuropathy, unspecified: Secondary | ICD-10-CM | POA: Diagnosis not present

## 2020-01-09 DIAGNOSIS — E039 Hypothyroidism, unspecified: Secondary | ICD-10-CM | POA: Diagnosis not present

## 2020-01-09 DIAGNOSIS — N183 Chronic kidney disease, stage 3 unspecified: Secondary | ICD-10-CM | POA: Diagnosis not present

## 2020-01-09 DIAGNOSIS — R69 Illness, unspecified: Secondary | ICD-10-CM | POA: Diagnosis not present

## 2020-01-09 DIAGNOSIS — R7303 Prediabetes: Secondary | ICD-10-CM | POA: Diagnosis not present

## 2020-01-09 DIAGNOSIS — Z95810 Presence of automatic (implantable) cardiac defibrillator: Secondary | ICD-10-CM | POA: Diagnosis not present

## 2020-01-09 DIAGNOSIS — I5022 Chronic systolic (congestive) heart failure: Secondary | ICD-10-CM | POA: Diagnosis not present

## 2020-01-09 DIAGNOSIS — Z0000 Encounter for general adult medical examination without abnormal findings: Secondary | ICD-10-CM | POA: Diagnosis not present

## 2020-01-09 DIAGNOSIS — I10 Essential (primary) hypertension: Secondary | ICD-10-CM | POA: Diagnosis not present

## 2020-01-09 DIAGNOSIS — E782 Mixed hyperlipidemia: Secondary | ICD-10-CM | POA: Diagnosis not present

## 2020-01-24 DIAGNOSIS — R1031 Right lower quadrant pain: Secondary | ICD-10-CM | POA: Diagnosis not present

## 2020-01-25 ENCOUNTER — Telehealth (HOSPITAL_COMMUNITY): Payer: Self-pay | Admitting: Pharmacy Technician

## 2020-01-25 NOTE — Telephone Encounter (Signed)
Sent in [employer] application via fax.  Will follow up.  

## 2020-01-30 DIAGNOSIS — I5022 Chronic systolic (congestive) heart failure: Secondary | ICD-10-CM | POA: Diagnosis not present

## 2020-01-30 DIAGNOSIS — E039 Hypothyroidism, unspecified: Secondary | ICD-10-CM | POA: Diagnosis not present

## 2020-01-30 DIAGNOSIS — E782 Mixed hyperlipidemia: Secondary | ICD-10-CM | POA: Diagnosis not present

## 2020-01-30 DIAGNOSIS — I6789 Other cerebrovascular disease: Secondary | ICD-10-CM | POA: Diagnosis not present

## 2020-01-30 DIAGNOSIS — I5020 Unspecified systolic (congestive) heart failure: Secondary | ICD-10-CM | POA: Diagnosis not present

## 2020-01-30 DIAGNOSIS — K219 Gastro-esophageal reflux disease without esophagitis: Secondary | ICD-10-CM | POA: Diagnosis not present

## 2020-01-30 DIAGNOSIS — I10 Essential (primary) hypertension: Secondary | ICD-10-CM | POA: Diagnosis not present

## 2020-01-30 DIAGNOSIS — R69 Illness, unspecified: Secondary | ICD-10-CM | POA: Diagnosis not present

## 2020-01-30 DIAGNOSIS — M1990 Unspecified osteoarthritis, unspecified site: Secondary | ICD-10-CM | POA: Diagnosis not present

## 2020-02-01 ENCOUNTER — Other Ambulatory Visit: Payer: Self-pay | Admitting: Internal Medicine

## 2020-02-02 ENCOUNTER — Other Ambulatory Visit: Payer: Self-pay | Admitting: Internal Medicine

## 2020-02-05 ENCOUNTER — Other Ambulatory Visit (HOSPITAL_COMMUNITY): Payer: Self-pay | Admitting: *Deleted

## 2020-02-05 MED ORDER — CARVEDILOL 6.25 MG PO TABS: 6.2500 mg | ORAL_TABLET | Freq: Two times a day (BID) | ORAL | 0 refills | Status: DC

## 2020-02-08 ENCOUNTER — Encounter (HOSPITAL_COMMUNITY): Payer: Self-pay | Admitting: Cardiology

## 2020-02-08 ENCOUNTER — Other Ambulatory Visit: Payer: Self-pay

## 2020-02-08 ENCOUNTER — Ambulatory Visit (HOSPITAL_COMMUNITY)
Admission: RE | Admit: 2020-02-08 | Discharge: 2020-02-08 | Disposition: A | Discharge status: Home / Self Care | Payer: Medicare HMO | Source: Ambulatory Visit | Attending: Cardiology | Admitting: Cardiology

## 2020-02-08 VITALS — BP 140/90 | HR 71 | Wt 190.6 lb

## 2020-02-08 DIAGNOSIS — G629 Polyneuropathy, unspecified: Secondary | ICD-10-CM | POA: Insufficient documentation

## 2020-02-08 DIAGNOSIS — R5383 Other fatigue: Secondary | ICD-10-CM | POA: Insufficient documentation

## 2020-02-08 DIAGNOSIS — E785 Hyperlipidemia, unspecified: Secondary | ICD-10-CM | POA: Diagnosis not present

## 2020-02-08 DIAGNOSIS — Z79899 Other long term (current) drug therapy: Secondary | ICD-10-CM | POA: Diagnosis not present

## 2020-02-08 DIAGNOSIS — I110 Hypertensive heart disease with heart failure: Secondary | ICD-10-CM | POA: Insufficient documentation

## 2020-02-08 DIAGNOSIS — I5022 Chronic systolic (congestive) heart failure: Secondary | ICD-10-CM | POA: Insufficient documentation

## 2020-02-08 DIAGNOSIS — Z79890 Hormone replacement therapy (postmenopausal): Secondary | ICD-10-CM | POA: Insufficient documentation

## 2020-02-08 DIAGNOSIS — I428 Other cardiomyopathies: Secondary | ICD-10-CM | POA: Insufficient documentation

## 2020-02-08 DIAGNOSIS — Z8249 Family history of ischemic heart disease and other diseases of the circulatory system: Secondary | ICD-10-CM | POA: Insufficient documentation

## 2020-02-08 DIAGNOSIS — I5032 Chronic diastolic (congestive) heart failure: Secondary | ICD-10-CM | POA: Diagnosis not present

## 2020-02-08 DIAGNOSIS — Z95810 Presence of automatic (implantable) cardiac defibrillator: Secondary | ICD-10-CM | POA: Diagnosis not present

## 2020-02-08 LAB — CBC
HCT: 42 % (ref 36.0–46.0)
Hemoglobin: 13.9 g/dL (ref 12.0–15.0)
MCH: 30.6 pg (ref 26.0–34.0)
MCHC: 33.1 g/dL (ref 30.0–36.0)
MCV: 92.5 fL (ref 80.0–100.0)
Platelets: 179 10*3/uL (ref 150–400)
RBC: 4.54 MIL/uL (ref 3.87–5.11)
RDW: 13 % (ref 11.5–15.5)
WBC: 6.1 10*3/uL (ref 4.0–10.5)
nRBC: 0 % (ref 0.0–0.2)

## 2020-02-08 LAB — BASIC METABOLIC PANEL
Anion gap: 10 (ref 5–15)
BUN: 19 mg/dL (ref 8–23)
CO2: 27 mmol/L (ref 22–32)
Calcium: 9.9 mg/dL (ref 8.9–10.3)
Chloride: 103 mmol/L (ref 98–111)
Creatinine, Ser: 1.18 mg/dL — ABNORMAL HIGH (ref 0.44–1.00)
GFR, Estimated: 45 mL/min — ABNORMAL LOW (ref >60)
Glucose, Bld: 93 mg/dL (ref 70–99)
Potassium: 4.4 mmol/L (ref 3.5–5.1)
Sodium: 140 mmol/L (ref 135–145)

## 2020-02-08 LAB — LIPID PANEL
Cholesterol: 235 mg/dL — ABNORMAL HIGH (ref 0–200)
HDL: 42 mg/dL (ref >40)
LDL Cholesterol: 146 mg/dL — ABNORMAL HIGH (ref 0–99)
Total CHOL/HDL Ratio: 5.6 RATIO
Triglycerides: 236 mg/dL — ABNORMAL HIGH (ref <150)
VLDL: 47 mg/dL — ABNORMAL HIGH (ref 0–40)

## 2020-02-08 LAB — TSH: TSH: 1.597 u[IU]/mL (ref 0.350–4.500)

## 2020-02-08 NOTE — Progress Notes (Signed)
Patient ID: Heather Smith, female   DOB: 21-Jan-1933, 87 y.o.   MRN: [id number] PCP: Dr. Kenton Kingfisher HF Cardiology: Aundra Dubin  87 y.o. with history of nonischemic cardiomyopathy and chronic systolic HF.  In 9/14, she was admitted to [facility] with chest pain.  Cardiac cath was done, showing no significant coronary disease.  However, echo showed EF 30-35%.  She was started on meds for CHF, and echo was repeated in 12/14, EF was read as 20-25%.  She had a Medtonic CRT-D device placed (had baseline LBBB).  In 3/15, the LV lead was not capturing properly so device had to be reprogrammed.  She had AV optimization in 5/15.  Limited echo at that time showed improvement in EF to 40-45%.  CPX in 5/15 was submaximal but suggested low normal to mildly reduced functional capacity. Last echo in 6/16 showed EF 35-40% with diffuse hypokinesis.  CPX was done in 9/16.  This actually showed good functional capacity. She had repeat echo in 1/17, showing EF increased to 60-65%.  Most recent echo in 8/18 showed EF 50-55% with mild LV dilation and mild MR.    She was seen by Dr. Caryl Comes for frequent PVCs (symptomatic).  She was started on flecainide.  She thinks that this has helped .   Atypical chest pain in 11/19, Cardiolite was normal.   Repeat echo in 1/20 showed EF 50-55%, moderate MR.    PYP scan in 1/21 was not suggestive of TTR amyloidosis.  Echo in 5/21 showed EF 50-55%, normal RV, mild MR.   She returns for followup of CHF.  She is now taking Lasix about twice a week.  She reports easy fatigability.  This has been chronic x years.  Rare atypical chest pain.  She walks for exercise though her chronic neuropathy limits how far she can go.  Mild dyspnea walking up a hill.  No dyspnea walking on flat ground.  BP is mildly elevated today, she says that it generally is ok at home. No palpitations.    ECG (personally reviewed): NSR, BiV pacing  Labs (9/14): TSH normal Labs (3/15): K 4.7, creatinine 1.1 Labs (5/15): K 4.9,  creatinine 1.1, BNP 44 Labs (6/15): K 5.1, creatinine 1.4, pro-BNP 39 Labs (6/15): K 5.3, creatinine 1.53 Labs (12/15): K 4.4, creatinine 1.03 Labs (5/16): K 4, creatinine 1.0, BNP 34.6 Labs (6/16): K 4.3, creatinine 1.1 Labs (9/16): K 4.4, creatinine 1.16 Labs (11/16): K 4.5, creatinine 1.16 Labs (12/16): K 4.6, creatinine 1.16, BNP 22, Mg 1.9 Labs (1/17): K 4.3, creatinine 1.34, HCT 42.4, ESR normal Labs (6/17): K 4.6, creatinine 1.23, hgb 9.9, TSH normal Labs (6/18): K 4.7, creatinine 1.09, BNP 94, hgb 13.8, TSH normal Labs (12/18): K 4.7, creatinine 0.98 Labs (10/19): BNP 25, K 4.8, creatinine 1.11 Labs (7/20): K 5, creatinine 1.17 Labs (9/20): K 4.9, creatinine 1.37 Labs (4/21): TSH normal, hgb 13.5, K 4.3, creatinine 1.3  PMH: 1. Chronic systolic CHF: Nonischemic cardiomyopathy.  LHC (9/14) with no significant coronary disease, EF 25-30% with global hypokinesis.  Echo (9/14) with EF 30-35%, moderate LV dilation, diffuse hypokinesis.  Echo (12/14) with EF 20-25%.  She does not drink ETOH.  She had Medtronic CRT-D device implantation in 1/15. Echo (5/15) with EF 40-45%, mild LVH.  CPX (5/15) was submaximal with RER 0.99, peak VO2 14, VE/VCO2 37.3; low normal functional capacity may be limited by loss of BiV pacing at higher HR and by deconditioning but interpretation somewhat limited since study was submaximal.  Echo (6/16)  with EF 35-40%, severe LV dilation, diffuse hypokinesis, mild MR.  CPX (9/16) with peak VO2 17.7, VE/VCO2 slope 32.3, RER 1.11 => excellent functional capacity. Echo (11/17) with EF 60-65%.  - Echo (8/18): EF 50-55%, mildly dilated LV, mild MR.  - Echo (1/20): EF 50-55%, moderate MR.  - PYP scan (1/21): H/CL 1.0, grade 1.  Not suggestive of TTR amyloidosis.  - Echo (5/21): EF 50-55%, normal RV, mild MR. 2. Back pain s/p lumbar laminectomy 3. OA: Knees. Left TKR in 6/17.  4. Hyperlipidemia 5. Depression 6. PVCs 7. Peripheral neuropathy: Uncertain etiology.  She is  not a diabetic.  8. Lung nodule: Last CT chest in 1/19 showed stable 6 mm LLL nodule, no further followup recommended.  9. Chest pain: Cardiolite (11/19) with EF 74%, no evidence for ischemia/infarction.   SH: [place], married, never smoked, no ETOH.    FH: Mother with MI at 30, possible ischemic cardiomyopathy.  Father with cardiomyopathy, died at 77.  She has 5 brothers, none with significant coronary disease.    ROS: All systems reviewed and negative except as per HPI.   Current Outpatient Medications  Medication Sig Dispense Refill  . acetaminophen (TYLENOL) 325 MG tablet Take 325 mg by mouth every 6 (six) hours as needed for moderate pain or headache.    . Ascorbic Acid (VITAMIN C) 1000 MG tablet Take 1,000 mg by mouth daily.    . carvedilol (COREG) 6.25 MG tablet Take 1 tablet (6.25 mg total) by mouth 2 (two) times daily with a meal. 30 tablet 0  . Coenzyme Q10-Fish Oil-Vit E (CO-Q 10 OMEGA-3 FISH OIL PO) Take 1 capsule by mouth daily.    . diphenhydramine-acetaminophen (TYLENOL PM) 25-500 MG TABS tablet Take 1 tablet by mouth at bedtime as needed (sleep).    . DULoxetine (CYMBALTA) 30 MG capsule Take 30 mg by mouth daily.     . flecainide (TAMBOCOR) 100 MG tablet Take 0.5 tablets (50 mg total) by mouth 2 (two) times daily. Please make overdue appt with Dr. Caryl Comes before anymore refills. Thank you 3rd and Final Attempt 15 tablet 0  . furosemide (LASIX) 20 MG tablet TAKE 1 TABLET (20 MG TOTAL) BY MOUTH AS NEEDED FOR FLUID (NO MORE THAN TWICE PER WEEK). 8 tablet 11  . gabapentin (NEURONTIN) 100 MG capsule Take 100 mg by mouth at bedtime.    Marland Kitchen levothyroxine (SYNTHROID, LEVOTHROID) 50 MCG tablet Take 50 mcg by mouth daily.    . Magnesium 250 MG TABS Take 250 mg by mouth daily.    . Menthol, Topical Analgesic, (ICY HOT EX) Apply 1 application topically at bedtime.    . multivitamin (THERAGRAN) per tablet Take 1 tablet by mouth daily.    . pantoprazole (PROTONIX) 40 MG tablet  Take 40 mg by mouth daily.    Vladimir Faster Glycol-Propyl Glycol (SYSTANE OP) Place 1 drop into both eyes daily as needed (dry eyes).    . sacubitril-valsartan (ENTRESTO) 24-26 MG Take 1 tablet by mouth 2 (two) times daily. 180 tablet 3  . spironolactone (ALDACTONE) 25 MG tablet Take 1 tablet (25 mg total) by mouth daily. 90 tablet 3  . vitamin B-12 (CYANOCOBALAMIN) 1000 MCG tablet Take 1,000 mcg by mouth daily.     Current Facility-Administered Medications  Medication Dose Route Frequency Provider Last Rate Last Admin  . triamcinolone acetonide (KENALOG) 10 MG/ML injection 10 mg  10 mg Other Once Regal, Norman S, DPM      . triamcinolone acetonide (KENALOG)  10 MG/ML injection 10 mg  10 mg Other Once Regal, Norman S, DPM      . triamcinolone acetonide (KENALOG) 10 MG/ML injection 10 mg  10 mg Other Once Wallene Huh, DPM       Vitals:   02/08/20 1445  BP: 140/90  Pulse: 71  SpO2: 97%  Weight: 86.5 kg (190 lb 9.6 oz)   Wt Readings from Last 3 Encounters:  02/08/20 86.5 kg (190 lb 9.6 oz)  06/02/19 86.7 kg (191 lb 3.2 oz)  11/22/18 86.6 kg (191 lb)   General: NAD Neck: No JVD, no thyromegaly or thyroid nodule.  Lungs: Clear to auscultation bilaterally with normal respiratory effort. CV: Nondisplaced PMI.  Heart regular S1/S2, no S3/S4, no murmur.  No peripheral edema.  No carotid bruit.  Normal pedal pulses.  Abdomen: Soft, nontender, no hepatosplenomegaly, no distention.  Skin: Intact without lesions or rashes.  Neurologic: Alert and oriented x 3.  Psych: Normal affect. Extremities: No clubbing or cyanosis.  HEENT: Normal.   Assessment/Plan: 1. Chronic systolic CHF: Nonischemic cardiomyopathy s/p Medtronic CRT-D. EF 35-40% (6/16). She does have a family history of cardiomyopathy of uncertain etiology (father) but none of her siblings developed a cardiomyopathy.  Familial cardiomyopathy is a consideration.  Viral myocarditis is also a possible etiology.  Echo in 1/17 showed  improvement in EF to 60-65%, echo in 8/18 showed EF 50-55% and echo in 1/20 showed stable EF 50-55%.  PYP scan not suggestive of TTR amyloidosis. Echo in 5/21 showed EF 50-55%, normal RV, mild MR. NYHA II symptoms but prominent fatigue.  She is not volume overloaded.   - Continue current Entresto, Coreg, and spironolactone.  BMET today.  - She can continue to take Lasix 20 mg twice a week. 2. PVCs: Followed by EP, now on flecainide. Minimal palpitations now.  - Continue flecainide 50 mg bid.  Will need nodal blocking agent while on flecainide, she is on Coreg.  3. Peripheral neuropathy: Bothersome peripheral neuropathy may be related to B12 insufficiency. PYP scan not suggestive of TTR amyloidosis.                     4. Fatigue: Chronic, no change.  5. HTN: BP mildly elevated today.  - Check BP daily at home x 2 wks and call in readings to me. Can increase Coreg or Entresto if needed.   Followup in 6 months.                                                           Loralie Champagne 02/08/2020

## 2020-02-08 NOTE — Patient Instructions (Addendum)
Labs done today, your results will be available in MyChart, we will contact you for abnormal readings.  Your physician has requested that you regularly monitor and record your blood pressure readings at home. Please use the same machine at the same time of day to check your readings and record them to bring to your follow-up visit.  Please call our office in June 2022 to schedule your follow up appointment  If you have any questions or concerns before your next appointment please send [place] a message through [place] or call our office at [phone].    TO LEAVE A MESSAGE FOR THE NURSE SELECT OPTION 2, PLEASE LEAVE A MESSAGE INCLUDING: . YOUR NAME . DATE OF BIRTH . CALL BACK NUMBER . REASON FOR CALL**this is important as we prioritize the call backs  YOU WILL RECEIVE A CALL BACK THE SAME DAY AS LONG AS YOU CALL BEFORE 4:00 PM

## 2020-02-12 ENCOUNTER — Telehealth (HOSPITAL_COMMUNITY): Payer: Self-pay

## 2020-02-12 DIAGNOSIS — I5032 Chronic diastolic (congestive) heart failure: Secondary | ICD-10-CM

## 2020-02-12 MED ORDER — EZETIMIBE 10 MG PO TABS: 10.0000 mg | ORAL_TABLET | Freq: Every day | ORAL | 3 refills | Status: DC

## 2020-02-12 NOTE — Telephone Encounter (Signed)
Patient advised and verbalized understanding. rx sent into patients pharmacy. Lab appt scheduled,lab order entered.    Orders Placed This Encounter  Procedures  . Lipid Profile    Standing Status:   Future    Standing Expiration Date:   02/11/2021    Order Specific Question:   Release to patient    Answer:   Immediate  . Hepatic function panel    Standing Status:   Future    Standing Expiration Date:   02/11/2021    Order Specific Question:   Release to patient    Answer:   Immediate   Meds ordered this encounter  Medications  . ezetimibe (ZETIA) 10 MG tablet    Sig: Take 1 tablet (10 mg total) by mouth daily.    Dispense:  90 tablet    Refill:  3

## 2020-02-12 NOTE — Telephone Encounter (Signed)
-----   Message from Larey Dresser, MD sent at 02/11/2020  6:58 PM EST ----- High LDL but she has difficult with statins.  Start Zetia 10 mg daily with lipids/LFTs in 2 months.

## 2020-03-04 ENCOUNTER — Other Ambulatory Visit: Payer: Self-pay | Admitting: Internal Medicine

## 2020-03-04 ENCOUNTER — Other Ambulatory Visit (HOSPITAL_COMMUNITY): Payer: Self-pay | Admitting: Cardiology

## 2020-03-04 DIAGNOSIS — E782 Mixed hyperlipidemia: Secondary | ICD-10-CM | POA: Diagnosis not present

## 2020-03-04 DIAGNOSIS — E039 Hypothyroidism, unspecified: Secondary | ICD-10-CM | POA: Diagnosis not present

## 2020-03-04 DIAGNOSIS — K219 Gastro-esophageal reflux disease without esophagitis: Secondary | ICD-10-CM | POA: Diagnosis not present

## 2020-03-04 DIAGNOSIS — R69 Illness, unspecified: Secondary | ICD-10-CM | POA: Diagnosis not present

## 2020-03-04 DIAGNOSIS — M1990 Unspecified osteoarthritis, unspecified site: Secondary | ICD-10-CM | POA: Diagnosis not present

## 2020-03-04 DIAGNOSIS — I10 Essential (primary) hypertension: Secondary | ICD-10-CM | POA: Diagnosis not present

## 2020-03-04 DIAGNOSIS — I5020 Unspecified systolic (congestive) heart failure: Secondary | ICD-10-CM | POA: Diagnosis not present

## 2020-03-04 DIAGNOSIS — I6789 Other cerebrovascular disease: Secondary | ICD-10-CM | POA: Diagnosis not present

## 2020-03-04 DIAGNOSIS — I5022 Chronic systolic (congestive) heart failure: Secondary | ICD-10-CM | POA: Diagnosis not present

## 2020-03-13 ENCOUNTER — Telehealth (HOSPITAL_COMMUNITY): Payer: Self-pay

## 2020-03-13 NOTE — Telephone Encounter (Deleted)
Advanced Heart Failure Patient Advocate Encounter   Patient was approved to receive Entresto from [employer]  Patient ID: [id number] Effective dates: 03/01/2020 through 02/01/2021  Left message.  Penni Homans, Student-PharmD

## 2020-03-18 ENCOUNTER — Telehealth (HOSPITAL_COMMUNITY): Payer: Self-pay | Admitting: Pharmacist

## 2020-03-18 NOTE — Telephone Encounter (Signed)
Entered in error

## 2020-03-18 NOTE — Telephone Encounter (Signed)
Encounter opened in error

## 2020-03-18 NOTE — Telephone Encounter (Signed)
Advanced Heart Failure Patient Advocate Encounter   Patient was approved to receive Entresto from [employer]  Patient ID: [id number] Effective dates: 03/01/2020 through 02/01/2021  Audry Riles, PharmD, BCPS, BCCP, CPP Heart Failure Clinic Pharmacist [phone]

## 2020-03-20 ENCOUNTER — Telehealth (HOSPITAL_COMMUNITY): Payer: Self-pay | Admitting: Pharmacy Technician

## 2020-03-20 NOTE — Telephone Encounter (Signed)
Patient had a few questions regarding assistance. Called and reminded her how to place a refill.  Charlann Boxer, CPhT

## 2020-03-21 ENCOUNTER — Ambulatory Visit (INDEPENDENT_AMBULATORY_CARE_PROVIDER_SITE_OTHER): Payer: Medicare HMO

## 2020-03-21 DIAGNOSIS — I428 Other cardiomyopathies: Secondary | ICD-10-CM

## 2020-03-21 LAB — CUP PACEART REMOTE DEVICE CHECK
Battery Remaining Longevity: 46 mo
Battery Voltage: 2.96 V
Brady Statistic AP VP Percent: 42.65 %
Brady Statistic AP VS Percent: 0.61 %
Brady Statistic AS VP Percent: 55.65 %
Brady Statistic AS VS Percent: 1.09 %
Brady Statistic RA Percent Paced: 42.66 %
Brady Statistic RV Percent Paced: 96.89 %
Date Time Interrogation Session: 20220217082604
HighPow Impedance: 75 Ohm
Implantable Lead Implant Date: 20150121
Implantable Lead Implant Date: 20150121
Implantable Lead Implant Date: 20150121
Implantable Lead Location: 753858
Implantable Lead Location: 753859
Implantable Lead Location: 753860
Implantable Lead Model: 4298
Implantable Lead Model: 5076
Implantable Pulse Generator Implant Date: 20200717
Lead Channel Impedance Value: 188.1 Ohm
Lead Channel Impedance Value: 198.837
Lead Channel Impedance Value: 198.837
Lead Channel Impedance Value: 222.34 Ohm
Lead Channel Impedance Value: 237.5 Ohm
Lead Channel Impedance Value: 342 Ohm
Lead Channel Impedance Value: 418 Ohm
Lead Channel Impedance Value: 456 Ohm
Lead Channel Impedance Value: 475 Ohm
Lead Channel Impedance Value: 475 Ohm
Lead Channel Impedance Value: 475 Ohm
Lead Channel Impedance Value: 551 Ohm
Lead Channel Impedance Value: 608 Ohm
Lead Channel Impedance Value: 608 Ohm
Lead Channel Impedance Value: 665 Ohm
Lead Channel Impedance Value: 703 Ohm
Lead Channel Impedance Value: 760 Ohm
Lead Channel Impedance Value: 779 Ohm
Lead Channel Pacing Threshold Amplitude: 0.5 V
Lead Channel Pacing Threshold Amplitude: 0.625 V
Lead Channel Pacing Threshold Amplitude: 1.625 V
Lead Channel Pacing Threshold Pulse Width: 0.4 ms
Lead Channel Pacing Threshold Pulse Width: 0.4 ms
Lead Channel Pacing Threshold Pulse Width: 1 ms
Lead Channel Sensing Intrinsic Amplitude: 12.625 mV
Lead Channel Sensing Intrinsic Amplitude: 12.625 mV
Lead Channel Sensing Intrinsic Amplitude: 4 mV
Lead Channel Sensing Intrinsic Amplitude: 4 mV
Lead Channel Setting Pacing Amplitude: 1.5 V
Lead Channel Setting Pacing Amplitude: 2 V
Lead Channel Setting Pacing Amplitude: 3 V
Lead Channel Setting Pacing Pulse Width: 0.4 ms
Lead Channel Setting Pacing Pulse Width: 1 ms
Lead Channel Setting Sensing Sensitivity: 0.3 mV

## 2020-03-28 DIAGNOSIS — K219 Gastro-esophageal reflux disease without esophagitis: Secondary | ICD-10-CM | POA: Diagnosis not present

## 2020-03-28 DIAGNOSIS — E782 Mixed hyperlipidemia: Secondary | ICD-10-CM | POA: Diagnosis not present

## 2020-03-28 DIAGNOSIS — E039 Hypothyroidism, unspecified: Secondary | ICD-10-CM | POA: Diagnosis not present

## 2020-03-28 DIAGNOSIS — M1990 Unspecified osteoarthritis, unspecified site: Secondary | ICD-10-CM | POA: Diagnosis not present

## 2020-03-28 DIAGNOSIS — I5022 Chronic systolic (congestive) heart failure: Secondary | ICD-10-CM | POA: Diagnosis not present

## 2020-03-28 DIAGNOSIS — R69 Illness, unspecified: Secondary | ICD-10-CM | POA: Diagnosis not present

## 2020-03-28 DIAGNOSIS — I10 Essential (primary) hypertension: Secondary | ICD-10-CM | POA: Diagnosis not present

## 2020-03-28 DIAGNOSIS — I6789 Other cerebrovascular disease: Secondary | ICD-10-CM | POA: Diagnosis not present

## 2020-03-28 DIAGNOSIS — I5020 Unspecified systolic (congestive) heart failure: Secondary | ICD-10-CM | POA: Diagnosis not present

## 2020-03-28 NOTE — Progress Notes (Signed)
Remote ICD transmission.   

## 2020-04-11 ENCOUNTER — Other Ambulatory Visit (HOSPITAL_COMMUNITY): Payer: Medicare HMO

## 2020-04-12 ENCOUNTER — Other Ambulatory Visit (HOSPITAL_COMMUNITY): Payer: Self-pay | Admitting: Cardiology

## 2020-04-12 ENCOUNTER — Ambulatory Visit (HOSPITAL_COMMUNITY)
Admission: RE | Admit: 2020-04-12 | Discharge: 2020-04-12 | Disposition: A | Discharge status: Home / Self Care | Payer: Medicare HMO | Source: Ambulatory Visit | Attending: Cardiology | Admitting: Cardiology

## 2020-04-12 ENCOUNTER — Other Ambulatory Visit: Payer: Self-pay

## 2020-04-12 DIAGNOSIS — I5032 Chronic diastolic (congestive) heart failure: Secondary | ICD-10-CM | POA: Insufficient documentation

## 2020-04-12 DIAGNOSIS — I493 Ventricular premature depolarization: Secondary | ICD-10-CM

## 2020-04-12 LAB — HEPATIC FUNCTION PANEL
ALT: 21 U/L (ref 0–44)
AST: 20 U/L (ref 15–41)
Albumin: 4.2 g/dL (ref 3.5–5.0)
Alkaline Phosphatase: 62 U/L (ref 38–126)
Bilirubin, Direct: 0.1 mg/dL (ref 0.0–0.2)
Indirect Bilirubin: 1.1 mg/dL — ABNORMAL HIGH (ref 0.3–0.9)
Total Bilirubin: 1.2 mg/dL (ref 0.3–1.2)
Total Protein: 7.1 g/dL (ref 6.5–8.1)

## 2020-04-12 LAB — LIPID PANEL
Cholesterol: 170 mg/dL (ref 0–200)
HDL: 43 mg/dL (ref >40)
LDL Cholesterol: 84 mg/dL (ref 0–99)
Total CHOL/HDL Ratio: 4 RATIO
Triglycerides: 217 mg/dL — ABNORMAL HIGH (ref <150)
VLDL: 43 mg/dL — ABNORMAL HIGH (ref 0–40)

## 2020-04-24 DIAGNOSIS — R69 Illness, unspecified: Secondary | ICD-10-CM | POA: Diagnosis not present

## 2020-04-24 DIAGNOSIS — I6789 Other cerebrovascular disease: Secondary | ICD-10-CM | POA: Diagnosis not present

## 2020-04-24 DIAGNOSIS — K219 Gastro-esophageal reflux disease without esophagitis: Secondary | ICD-10-CM | POA: Diagnosis not present

## 2020-04-24 DIAGNOSIS — E039 Hypothyroidism, unspecified: Secondary | ICD-10-CM | POA: Diagnosis not present

## 2020-04-24 DIAGNOSIS — I5020 Unspecified systolic (congestive) heart failure: Secondary | ICD-10-CM | POA: Diagnosis not present

## 2020-04-24 DIAGNOSIS — I10 Essential (primary) hypertension: Secondary | ICD-10-CM | POA: Diagnosis not present

## 2020-04-24 DIAGNOSIS — I5022 Chronic systolic (congestive) heart failure: Secondary | ICD-10-CM | POA: Diagnosis not present

## 2020-04-24 DIAGNOSIS — M1990 Unspecified osteoarthritis, unspecified site: Secondary | ICD-10-CM | POA: Diagnosis not present

## 2020-04-24 DIAGNOSIS — E782 Mixed hyperlipidemia: Secondary | ICD-10-CM | POA: Diagnosis not present

## 2020-04-24 DIAGNOSIS — N183 Chronic kidney disease, stage 3 unspecified: Secondary | ICD-10-CM | POA: Diagnosis not present

## 2020-04-29 ENCOUNTER — Ambulatory Visit: Payer: Medicare HMO | Admitting: Internal Medicine

## 2020-04-29 ENCOUNTER — Other Ambulatory Visit: Payer: Self-pay

## 2020-04-29 ENCOUNTER — Encounter: Payer: Self-pay | Admitting: Internal Medicine

## 2020-04-29 VITALS — BP 110/90 | HR 80 | Ht 67.0 in | Wt 191.0 lb

## 2020-04-29 DIAGNOSIS — I5022 Chronic systolic (congestive) heart failure: Secondary | ICD-10-CM | POA: Diagnosis not present

## 2020-04-29 DIAGNOSIS — I428 Other cardiomyopathies: Secondary | ICD-10-CM | POA: Diagnosis not present

## 2020-04-29 DIAGNOSIS — I493 Ventricular premature depolarization: Secondary | ICD-10-CM

## 2020-04-29 DIAGNOSIS — Z95810 Presence of automatic (implantable) cardiac defibrillator: Secondary | ICD-10-CM | POA: Diagnosis not present

## 2020-04-29 NOTE — Progress Notes (Signed)
/      Patient Care Team: Shirline Frees, MD as PCP - General   HPI  Heather Smith is a 87 y.o. female Seen in followup for CRT implantation for nonischemic cardiomyopathy and depressed LV function. This was accomplished fall 2014  Interval partial lead dislodgment noted after she saw Dr. Mariana Single in March and an x-ray was obtained and reviewed. This  x-ray was reviewed. We reprogrammed the device    I reviewed with her the data from her CPX 9/16 showed excellent functional capacity and her echo from last week demonstrated normal LV function  DATE TEST EF   12/14 Echo  20-25%   1/17    Echo   55-62 %   1/18    Echo  55-60 %   11/19 MYOVIEW 75% NO ischemia   1/20 Echo  50-55% 2 MR jets-overall mild   Recent PYP scan inclusive because of neuropathy;   chronic SOB fatigue and lassitude.  Has been sitting around. Thankfully her weight has been stable  Mood is stable.  Husband actually thinks it is better  She ran out of flecainide 2 months ago.  Could not get a refill without visit.    Date Cr K  8//18 1.07 4.8  10/19  1.11 4.8  1/22 1.18 4.4   DATE QRS Paced duration Flecainide  7/18 144    12/18 168 50  12/19 174 50  7/20 182 50      Past Medical History:  Diagnosis Date  . Chest pain    a. Nl cath in the 90's;  b. 04/2005: Myoview: subtle areao of ? reversibility in apical segment of anteromedial LV - ? attenuation, EF 54%;  c. 05/2012 MV: small, fixed mild apical septal perfusion defect, ? attenuation due to lbbb, no ischemia.  . CHF (congestive heart failure) ([place])   . Complication of anesthesia    difficult to wake up and blood pressure drops  . GERD (gastroesophageal reflux disease)   . Hypercholesterolemia   . Hypertension   . Hypothyroidism   . Left bundle branch block   . LV lead partial dislodgment 05/30/2013  . Neurogenic claudication due to lumbar spinal stenosis   . Non-ischemic cardiomyopathy ([place])    [place] 10/04/12: Normal coronary arteries, EF  25-30% with global HK.  Echocardiogram 10/05/12: EF 30-35%, diffuse HK, moderate LAE. Echocardiogram (01/2013): Lateral to septal wall dyssynchrony EF 20-25%, diffuse HK  . Osteoarthritis    "about all over" (02/22/2013)  . Spinal stenosis   . Vitamin D deficiency     Past Surgical History:  Procedure Laterality Date  . APPENDECTOMY    . BI-VENTRICULAR IMPLANTABLE CARDIOVERTER DEFIBRILLATOR N/A 02/22/2013   Procedure: BI-VENTRICULAR IMPLANTABLE CARDIOVERTER DEFIBRILLATOR  (CRT-D);  Surgeon: Deboraha Sprang, MD;  Location: [facility] CATH LAB;  Service: Cardiovascular;  Laterality: N/A;  . BIV ICD GENERATOR CHANGEOUT N/A 08/19/2018   Procedure: BIV ICD GENERATOR CHANGEOUT;  Surgeon: Deboraha Sprang, MD;  Location: [facility] CV LAB;  Service: Cardiovascular;  Laterality: N/A;  . BLEPHAROPLASTY Bilateral   . EXCISIONAL HEMORRHOIDECTOMY    . FOOT ARTHRODESIS, MODIFIED [place] Bilateral   . KNEE ARTHROSCOPY Right   . LEFT HEART CATHETERIZATION WITH CORONARY ANGIOGRAM N/A 10/04/2012   Procedure: LEFT HEART CATHETERIZATION WITH CORONARY ANGIOGRAM;  Surgeon: Peter M Martinique, MD;  Location: [facility] CATH LAB;  Service: Cardiovascular;  Laterality: N/A;  . LUMBAR LAMINECTOMY  11/03/2006    Bilateral 3, 4, 5 laminectomy, partial L2, decompression of the thecal  sac, foraminotomy, posterolateral arthrodesis L3-S1 with autograft   -- SURGEON:  Leeroy Cha, M.D.   . TONSILLECTOMY    . TOTAL KNEE ARTHROPLASTY Left 07/15/2015   Procedure: LEFT TOTAL KNEE ARTHROPLASTY;  Surgeon: Frederik Pear, MD;  Location: [place];  Service: Orthopedics;  Laterality: Left;  . TUBAL LIGATION    . VAGINAL HYSTERECTOMY      Current Outpatient Medications  Medication Sig Dispense Refill  . acetaminophen (TYLENOL) 325 MG tablet Take 325 mg by mouth every 6 (six) hours as needed for moderate pain or headache.    . Ascorbic Acid (VITAMIN C) 1000 MG tablet Take 1,000 mg by mouth daily.    . carvedilol (COREG) 6.25 MG tablet TAKE ONE TABLET BY  MOUTH AT BREAKFAST AND AT BEDTIME 180 tablet 1  . Coenzyme Q10-Fish Oil-Vit E (CO-Q 10 OMEGA-3 FISH OIL PO) Take 1 capsule by mouth daily.    . diphenhydramine-acetaminophen (TYLENOL PM) 25-500 MG TABS tablet Take 1 tablet by mouth at bedtime as needed (sleep).    . DULoxetine (CYMBALTA) 30 MG capsule Take 30 mg by mouth daily.     Marland Kitchen ezetimibe (ZETIA) 10 MG tablet Take 1 tablet (10 mg total) by mouth daily. 90 tablet 3  . flecainide (TAMBOCOR) 100 MG tablet Take 0.5 tablets (50 mg total) by mouth 2 (two) times daily. Please make overdue appt with Dr. Caryl Comes before anymore refills. Thank you 3rd and Final Attempt 15 tablet 0  . furosemide (LASIX) 20 MG tablet TAKE 1 TABLET (20 MG TOTAL) BY MOUTH AS NEEDED FOR FLUID (NO MORE THAN TWICE PER WEEK). 8 tablet 11  . gabapentin (NEURONTIN) 100 MG capsule Take 100 mg by mouth at bedtime.    Marland Kitchen levothyroxine (SYNTHROID, LEVOTHROID) 50 MCG tablet Take 50 mcg by mouth daily.    . Magnesium 250 MG TABS Take 250 mg by mouth daily.    . Menthol, Topical Analgesic, (ICY HOT EX) Apply 1 application topically at bedtime.    . multivitamin (THERAGRAN) per tablet Take 1 tablet by mouth daily.    . pantoprazole (PROTONIX) 40 MG tablet Take 40 mg by mouth daily.    Vladimir Faster Glycol-Propyl Glycol (SYSTANE OP) Place 1 drop into both eyes daily as needed (dry eyes).    . sacubitril-valsartan (ENTRESTO) 24-26 MG Take 1 tablet by mouth 2 (two) times daily. 180 tablet 3  . spironolactone (ALDACTONE) 25 MG tablet Take 1 tablet (25 mg total) by mouth daily. 90 tablet 3  . vitamin B-12 (CYANOCOBALAMIN) 1000 MCG tablet Take 1,000 mcg by mouth daily.     Current Facility-Administered Medications  Medication Dose Route Frequency Provider Last Rate Last Admin  . triamcinolone acetonide (KENALOG) 10 MG/ML injection 10 mg  10 mg Other Once Regal, Norman S, DPM      . triamcinolone acetonide (KENALOG) 10 MG/ML injection 10 mg  10 mg Other Once Regal, Norman S, DPM      .  triamcinolone acetonide (KENALOG) 10 MG/ML injection 10 mg  10 mg Other Once Wallene Huh, DPM        Allergies  Allergen Reactions  . Statins Other (See Comments)    "Makes her bones hurt and very sore"  . Niaspan [Niacin Er]     High doses, causes hot flashes & aching   . Simvastatin Other (See Comments)  . Benadryl [Diphenhydramine] Other (See Comments)    Makes pt feel jittery   . Dilaudid [Hydromorphone Hcl] Itching    Review of Systems  negative except from HPI and PMH  Physical Exam BP 110/90   Pulse 80   Ht 5\' 7"  (1.702 m)   Wt 191 lb (86.6 kg)   SpO2 95%   BMI 29.91 kg/m  .Well developed and well nourished in no acute distress HENT normal Neck supple with JVP-flat Clear Device pocket well healed; without hematoma or erythema.  There is no tethering  Regular rate and rhythm, no murmur Abd-soft with active BS No Clubbing cyanosis   edema Skin-warm and dry A & Oriented  Grossly normal sensory and motor function  ECG sinus P-synchronous/ AV  Pacing Upright QRS lead V1 negative QRS lead I        Assessment and  Plan  Nonischemic cardiomyopathy-- interval normalization  Congestive heart failure-chronic-diastolic    Medtronic ICD-CRT  The patient's device was interrogated and the information was fully reviewed.  The device was reprogrammed to increased LV threshold   Atrial fibrillation-paroxysmal  LV threshold change  Chronotropic incompetence  Depression   PVCs    Functional status remains stable.  No recent assessment of LV function.  She has come off of her flecainide.  Her PVC burden is increased based on the device interrogation.  But only from less than 0.5%--2.2%.  These are always under counting; she would like to stay off of flecainide and so we will plan to reassess in 3 to 4 months time her PVC burden and we will continue her off flecainide in the interim.  Heart rate excursion is adequate.  Euvolemic so we will continue current  medications.  No interval atrial fibrillation to speak of.  Mood is stable.

## 2020-04-29 NOTE — Patient Instructions (Signed)
Medication Instructions:  Your physician recommends that you continue on your current medications as directed. Please refer to the Current Medication list given to you today.  *If you need a refill on your cardiac medications before your next appointment, please call your pharmacy*   Lab Work: None ordered.  If you have labs (blood work) drawn today and your tests are completely normal, you will receive your results only by: Marland Kitchen MyChart Message (if you have MyChart) OR . A paper copy in the mail If you have any lab test that is abnormal or we need to change your treatment, we will call you to review the results.   Testing/Procedures: None ordered.    Follow-Up: At [facility] you and your health needs are our priority.  As part of our continuing mission to provide you with exceptional heart care, we have created designated Provider Care Teams.  These Care Teams include your primary Cardiologist (physician) and Advanced Practice Providers (APPs -  Physician Assistants and Nurse Practitioners) who all work together to provide you with the care you need, when you need it.  We recommend signing up for the patient portal called "MyChart".  Sign up information is provided on this After Visit Summary.  MyChart is used to connect with patients for Virtual Visits (Telemedicine).  Patients are able to view lab/test results, encounter notes, upcoming appointments, etc.  Non-urgent messages can be sent to your provider as well.   To learn more about what you can do with MyChart, go to [email or web address].    Your next appointment:   4 month(s)  The format for your next appointment:   In Person  Provider:   Virl Axe, MD

## 2020-05-02 ENCOUNTER — Other Ambulatory Visit: Payer: Self-pay | Admitting: Internal Medicine

## 2020-05-08 ENCOUNTER — Encounter (HOSPITAL_COMMUNITY): Payer: Self-pay | Admitting: *Deleted

## 2020-05-08 DIAGNOSIS — I130 Hypertensive heart and chronic kidney disease with heart failure and stage 1 through stage 4 chronic kidney disease, or unspecified chronic kidney disease: Secondary | ICD-10-CM | POA: Diagnosis not present

## 2020-05-08 DIAGNOSIS — E669 Obesity, unspecified: Secondary | ICD-10-CM | POA: Diagnosis not present

## 2020-05-08 DIAGNOSIS — I471 Supraventricular tachycardia: Secondary | ICD-10-CM | POA: Diagnosis not present

## 2020-05-08 DIAGNOSIS — I739 Peripheral vascular disease, unspecified: Secondary | ICD-10-CM | POA: Diagnosis not present

## 2020-05-08 DIAGNOSIS — G4700 Insomnia, unspecified: Secondary | ICD-10-CM | POA: Diagnosis not present

## 2020-05-08 DIAGNOSIS — I509 Heart failure, unspecified: Secondary | ICD-10-CM | POA: Diagnosis not present

## 2020-05-08 DIAGNOSIS — E039 Hypothyroidism, unspecified: Secondary | ICD-10-CM | POA: Diagnosis not present

## 2020-05-08 DIAGNOSIS — E785 Hyperlipidemia, unspecified: Secondary | ICD-10-CM | POA: Diagnosis not present

## 2020-05-08 DIAGNOSIS — R69 Illness, unspecified: Secondary | ICD-10-CM | POA: Diagnosis not present

## 2020-05-08 DIAGNOSIS — E261 Secondary hyperaldosteronism: Secondary | ICD-10-CM | POA: Diagnosis not present

## 2020-05-08 DIAGNOSIS — Z008 Encounter for other general examination: Secondary | ICD-10-CM | POA: Diagnosis not present

## 2020-05-28 DIAGNOSIS — I5020 Unspecified systolic (congestive) heart failure: Secondary | ICD-10-CM | POA: Diagnosis not present

## 2020-05-28 DIAGNOSIS — E039 Hypothyroidism, unspecified: Secondary | ICD-10-CM | POA: Diagnosis not present

## 2020-05-28 DIAGNOSIS — K219 Gastro-esophageal reflux disease without esophagitis: Secondary | ICD-10-CM | POA: Diagnosis not present

## 2020-05-28 DIAGNOSIS — E782 Mixed hyperlipidemia: Secondary | ICD-10-CM | POA: Diagnosis not present

## 2020-05-28 DIAGNOSIS — I5022 Chronic systolic (congestive) heart failure: Secondary | ICD-10-CM | POA: Diagnosis not present

## 2020-05-28 DIAGNOSIS — I10 Essential (primary) hypertension: Secondary | ICD-10-CM | POA: Diagnosis not present

## 2020-05-28 DIAGNOSIS — M1990 Unspecified osteoarthritis, unspecified site: Secondary | ICD-10-CM | POA: Diagnosis not present

## 2020-05-28 DIAGNOSIS — R69 Illness, unspecified: Secondary | ICD-10-CM | POA: Diagnosis not present

## 2020-05-28 DIAGNOSIS — I6789 Other cerebrovascular disease: Secondary | ICD-10-CM | POA: Diagnosis not present

## 2020-06-20 ENCOUNTER — Ambulatory Visit (INDEPENDENT_AMBULATORY_CARE_PROVIDER_SITE_OTHER): Payer: Medicare HMO

## 2020-06-20 DIAGNOSIS — I428 Other cardiomyopathies: Secondary | ICD-10-CM | POA: Diagnosis not present

## 2020-06-20 LAB — CUP PACEART REMOTE DEVICE CHECK
Battery Remaining Longevity: 40 mo
Battery Voltage: 2.96 V
Brady Statistic AP VP Percent: 32.44 %
Brady Statistic AP VS Percent: 0.46 %
Brady Statistic AS VP Percent: 65.88 %
Brady Statistic AS VS Percent: 1.22 %
Brady Statistic RA Percent Paced: 32.25 %
Brady Statistic RV Percent Paced: 96.61 %
Date Time Interrogation Session: 20220519072505
HighPow Impedance: 70 Ohm
Implantable Lead Implant Date: 20150121
Implantable Lead Implant Date: 20150121
Implantable Lead Implant Date: 20150121
Implantable Lead Location: 753858
Implantable Lead Location: 753859
Implantable Lead Location: 753860
Implantable Lead Model: 4298
Implantable Lead Model: 5076
Implantable Pulse Generator Implant Date: 20200717
Lead Channel Impedance Value: 184.154
Lead Channel Impedance Value: 188.1 Ohm
Lead Channel Impedance Value: 195.429
Lead Channel Impedance Value: 204.14 Ohm
Lead Channel Impedance Value: 218.087
Lead Channel Impedance Value: 342 Ohm
Lead Channel Impedance Value: 399 Ohm
Lead Channel Impedance Value: 418 Ohm
Lead Channel Impedance Value: 456 Ohm
Lead Channel Impedance Value: 456 Ohm
Lead Channel Impedance Value: 475 Ohm
Lead Channel Impedance Value: 551 Ohm
Lead Channel Impedance Value: 608 Ohm
Lead Channel Impedance Value: 646 Ohm
Lead Channel Impedance Value: 665 Ohm
Lead Channel Impedance Value: 703 Ohm
Lead Channel Impedance Value: 722 Ohm
Lead Channel Impedance Value: 779 Ohm
Lead Channel Pacing Threshold Amplitude: 0.625 V
Lead Channel Pacing Threshold Amplitude: 0.75 V
Lead Channel Pacing Threshold Amplitude: 3.25 V
Lead Channel Pacing Threshold Pulse Width: 0.4 ms
Lead Channel Pacing Threshold Pulse Width: 0.4 ms
Lead Channel Pacing Threshold Pulse Width: 1 ms
Lead Channel Sensing Intrinsic Amplitude: 15.5 mV
Lead Channel Sensing Intrinsic Amplitude: 15.5 mV
Lead Channel Sensing Intrinsic Amplitude: 2.75 mV
Lead Channel Sensing Intrinsic Amplitude: 2.75 mV
Lead Channel Setting Pacing Amplitude: 1.75 V
Lead Channel Setting Pacing Amplitude: 2 V
Lead Channel Setting Pacing Amplitude: 3 V
Lead Channel Setting Pacing Pulse Width: 0.4 ms
Lead Channel Setting Pacing Pulse Width: 1 ms
Lead Channel Setting Sensing Sensitivity: 0.3 mV

## 2020-06-26 DIAGNOSIS — E039 Hypothyroidism, unspecified: Secondary | ICD-10-CM | POA: Diagnosis not present

## 2020-06-26 DIAGNOSIS — I10 Essential (primary) hypertension: Secondary | ICD-10-CM | POA: Diagnosis not present

## 2020-06-26 DIAGNOSIS — M1990 Unspecified osteoarthritis, unspecified site: Secondary | ICD-10-CM | POA: Diagnosis not present

## 2020-06-26 DIAGNOSIS — K219 Gastro-esophageal reflux disease without esophagitis: Secondary | ICD-10-CM | POA: Diagnosis not present

## 2020-06-26 DIAGNOSIS — I5022 Chronic systolic (congestive) heart failure: Secondary | ICD-10-CM | POA: Diagnosis not present

## 2020-06-26 DIAGNOSIS — R69 Illness, unspecified: Secondary | ICD-10-CM | POA: Diagnosis not present

## 2020-06-26 DIAGNOSIS — E782 Mixed hyperlipidemia: Secondary | ICD-10-CM | POA: Diagnosis not present

## 2020-06-26 DIAGNOSIS — N183 Chronic kidney disease, stage 3 unspecified: Secondary | ICD-10-CM | POA: Diagnosis not present

## 2020-06-26 DIAGNOSIS — I6789 Other cerebrovascular disease: Secondary | ICD-10-CM | POA: Diagnosis not present

## 2020-06-27 ENCOUNTER — Telehealth: Payer: Self-pay | Admitting: Internal Medicine

## 2020-06-27 MED ORDER — FLECAINIDE ACETATE 50 MG PO TABS: 50.0000 mg | ORAL_TABLET | Freq: Two times a day (BID) | ORAL | 3 refills | Status: DC

## 2020-06-27 NOTE — Telephone Encounter (Signed)
*  STAT* If patient is at the pharmacy, call can be transferred to refill team.   1. Which medications need to be refilled? (please list name of each medication and dose if known) Flecainide 50 mg  2. Which pharmacy/location (including street and city if local pharmacy) is medication to be sent to? [facility]   3. Do they need a 30 day or 90 day supply? [place]

## 2020-07-11 DIAGNOSIS — H2513 Age-related nuclear cataract, bilateral: Secondary | ICD-10-CM | POA: Diagnosis not present

## 2020-07-11 DIAGNOSIS — H02403 Unspecified ptosis of bilateral eyelids: Secondary | ICD-10-CM | POA: Diagnosis not present

## 2020-07-11 DIAGNOSIS — H04123 Dry eye syndrome of bilateral lacrimal glands: Secondary | ICD-10-CM | POA: Diagnosis not present

## 2020-07-12 NOTE — Progress Notes (Signed)
Remote ICD transmission.   

## 2020-07-23 DIAGNOSIS — E782 Mixed hyperlipidemia: Secondary | ICD-10-CM | POA: Diagnosis not present

## 2020-07-23 DIAGNOSIS — I6789 Other cerebrovascular disease: Secondary | ICD-10-CM | POA: Diagnosis not present

## 2020-07-23 DIAGNOSIS — E039 Hypothyroidism, unspecified: Secondary | ICD-10-CM | POA: Diagnosis not present

## 2020-07-23 DIAGNOSIS — K219 Gastro-esophageal reflux disease without esophagitis: Secondary | ICD-10-CM | POA: Diagnosis not present

## 2020-07-23 DIAGNOSIS — N183 Chronic kidney disease, stage 3 unspecified: Secondary | ICD-10-CM | POA: Diagnosis not present

## 2020-07-23 DIAGNOSIS — I5020 Unspecified systolic (congestive) heart failure: Secondary | ICD-10-CM | POA: Diagnosis not present

## 2020-07-23 DIAGNOSIS — R69 Illness, unspecified: Secondary | ICD-10-CM | POA: Diagnosis not present

## 2020-07-23 DIAGNOSIS — M1990 Unspecified osteoarthritis, unspecified site: Secondary | ICD-10-CM | POA: Diagnosis not present

## 2020-07-23 DIAGNOSIS — I5022 Chronic systolic (congestive) heart failure: Secondary | ICD-10-CM | POA: Diagnosis not present

## 2020-07-23 DIAGNOSIS — I10 Essential (primary) hypertension: Secondary | ICD-10-CM | POA: Diagnosis not present

## 2020-08-27 DIAGNOSIS — Z0100 Encounter for examination of eyes and vision without abnormal findings: Secondary | ICD-10-CM | POA: Diagnosis not present

## 2020-08-29 DIAGNOSIS — N183 Chronic kidney disease, stage 3 unspecified: Secondary | ICD-10-CM | POA: Diagnosis not present

## 2020-08-29 DIAGNOSIS — I10 Essential (primary) hypertension: Secondary | ICD-10-CM | POA: Diagnosis not present

## 2020-08-29 DIAGNOSIS — K219 Gastro-esophageal reflux disease without esophagitis: Secondary | ICD-10-CM | POA: Diagnosis not present

## 2020-08-29 DIAGNOSIS — I5022 Chronic systolic (congestive) heart failure: Secondary | ICD-10-CM | POA: Diagnosis not present

## 2020-08-29 DIAGNOSIS — R69 Illness, unspecified: Secondary | ICD-10-CM | POA: Diagnosis not present

## 2020-08-29 DIAGNOSIS — E782 Mixed hyperlipidemia: Secondary | ICD-10-CM | POA: Diagnosis not present

## 2020-08-29 DIAGNOSIS — E039 Hypothyroidism, unspecified: Secondary | ICD-10-CM | POA: Diagnosis not present

## 2020-08-29 DIAGNOSIS — I6789 Other cerebrovascular disease: Secondary | ICD-10-CM | POA: Diagnosis not present

## 2020-08-29 DIAGNOSIS — M1990 Unspecified osteoarthritis, unspecified site: Secondary | ICD-10-CM | POA: Diagnosis not present

## 2020-09-19 ENCOUNTER — Ambulatory Visit (INDEPENDENT_AMBULATORY_CARE_PROVIDER_SITE_OTHER): Payer: Medicare HMO

## 2020-09-19 ENCOUNTER — Other Ambulatory Visit (HOSPITAL_COMMUNITY): Payer: Self-pay | Admitting: Cardiology

## 2020-09-19 DIAGNOSIS — I428 Other cardiomyopathies: Secondary | ICD-10-CM

## 2020-09-19 LAB — CUP PACEART REMOTE DEVICE CHECK
Battery Remaining Longevity: 35 mo
Battery Voltage: 2.95 V
Brady Statistic AP VP Percent: 49.3 %
Brady Statistic AP VS Percent: 0.55 %
Brady Statistic AS VP Percent: 49.21 %
Brady Statistic AS VS Percent: 0.94 %
Brady Statistic RA Percent Paced: 49.04 %
Brady Statistic RV Percent Paced: 97.02 %
Date Time Interrogation Session: 20220818062204
HighPow Impedance: 74 Ohm
Implantable Lead Implant Date: 20150121
Implantable Lead Implant Date: 20150121
Implantable Lead Implant Date: 20150121
Implantable Lead Location: 753858
Implantable Lead Location: 753859
Implantable Lead Location: 753860
Implantable Lead Model: 4298
Implantable Lead Model: 5076
Implantable Pulse Generator Implant Date: 20200717
Lead Channel Impedance Value: 188.1 Ohm
Lead Channel Impedance Value: 188.1 Ohm
Lead Channel Impedance Value: 195.429
Lead Channel Impedance Value: 209 Ohm
Lead Channel Impedance Value: 218.087
Lead Channel Impedance Value: 342 Ohm
Lead Channel Impedance Value: 418 Ohm
Lead Channel Impedance Value: 418 Ohm
Lead Channel Impedance Value: 418 Ohm
Lead Channel Impedance Value: 456 Ohm
Lead Channel Impedance Value: 475 Ohm
Lead Channel Impedance Value: 551 Ohm
Lead Channel Impedance Value: 589 Ohm
Lead Channel Impedance Value: 608 Ohm
Lead Channel Impedance Value: 665 Ohm
Lead Channel Impedance Value: 703 Ohm
Lead Channel Impedance Value: 760 Ohm
Lead Channel Impedance Value: 760 Ohm
Lead Channel Pacing Threshold Amplitude: 0.625 V
Lead Channel Pacing Threshold Amplitude: 0.75 V
Lead Channel Pacing Threshold Amplitude: 3 V
Lead Channel Pacing Threshold Pulse Width: 0.4 ms
Lead Channel Pacing Threshold Pulse Width: 0.4 ms
Lead Channel Pacing Threshold Pulse Width: 1 ms
Lead Channel Sensing Intrinsic Amplitude: 14.75 mV
Lead Channel Sensing Intrinsic Amplitude: 14.75 mV
Lead Channel Sensing Intrinsic Amplitude: 2.875 mV
Lead Channel Sensing Intrinsic Amplitude: 2.875 mV
Lead Channel Setting Pacing Amplitude: 1.5 V
Lead Channel Setting Pacing Amplitude: 2 V
Lead Channel Setting Pacing Amplitude: 3 V
Lead Channel Setting Pacing Pulse Width: 0.4 ms
Lead Channel Setting Pacing Pulse Width: 1 ms
Lead Channel Setting Sensing Sensitivity: 0.3 mV

## 2020-09-26 DIAGNOSIS — I5020 Unspecified systolic (congestive) heart failure: Secondary | ICD-10-CM | POA: Diagnosis not present

## 2020-09-26 DIAGNOSIS — K219 Gastro-esophageal reflux disease without esophagitis: Secondary | ICD-10-CM | POA: Diagnosis not present

## 2020-09-26 DIAGNOSIS — M1990 Unspecified osteoarthritis, unspecified site: Secondary | ICD-10-CM | POA: Diagnosis not present

## 2020-09-26 DIAGNOSIS — I5022 Chronic systolic (congestive) heart failure: Secondary | ICD-10-CM | POA: Diagnosis not present

## 2020-09-26 DIAGNOSIS — I10 Essential (primary) hypertension: Secondary | ICD-10-CM | POA: Diagnosis not present

## 2020-09-26 DIAGNOSIS — E039 Hypothyroidism, unspecified: Secondary | ICD-10-CM | POA: Diagnosis not present

## 2020-09-26 DIAGNOSIS — R69 Illness, unspecified: Secondary | ICD-10-CM | POA: Diagnosis not present

## 2020-09-26 DIAGNOSIS — E782 Mixed hyperlipidemia: Secondary | ICD-10-CM | POA: Diagnosis not present

## 2020-09-26 DIAGNOSIS — I6789 Other cerebrovascular disease: Secondary | ICD-10-CM | POA: Diagnosis not present

## 2020-10-09 NOTE — Progress Notes (Signed)
Remote ICD transmission.   

## 2020-10-11 DIAGNOSIS — N183 Chronic kidney disease, stage 3 unspecified: Secondary | ICD-10-CM | POA: Diagnosis not present

## 2020-10-11 DIAGNOSIS — R7309 Other abnormal glucose: Secondary | ICD-10-CM | POA: Diagnosis not present

## 2020-10-11 DIAGNOSIS — R69 Illness, unspecified: Secondary | ICD-10-CM | POA: Diagnosis not present

## 2020-10-11 DIAGNOSIS — E039 Hypothyroidism, unspecified: Secondary | ICD-10-CM | POA: Diagnosis not present

## 2020-10-11 DIAGNOSIS — Z23 Encounter for immunization: Secondary | ICD-10-CM | POA: Diagnosis not present

## 2020-10-11 DIAGNOSIS — I10 Essential (primary) hypertension: Secondary | ICD-10-CM | POA: Diagnosis not present

## 2020-10-11 DIAGNOSIS — E782 Mixed hyperlipidemia: Secondary | ICD-10-CM | POA: Diagnosis not present

## 2020-10-11 DIAGNOSIS — I5022 Chronic systolic (congestive) heart failure: Secondary | ICD-10-CM | POA: Diagnosis not present

## 2020-10-11 DIAGNOSIS — G629 Polyneuropathy, unspecified: Secondary | ICD-10-CM | POA: Diagnosis not present

## 2020-10-22 DIAGNOSIS — I10 Essential (primary) hypertension: Secondary | ICD-10-CM | POA: Diagnosis not present

## 2020-10-22 DIAGNOSIS — E782 Mixed hyperlipidemia: Secondary | ICD-10-CM | POA: Diagnosis not present

## 2020-10-22 DIAGNOSIS — M1990 Unspecified osteoarthritis, unspecified site: Secondary | ICD-10-CM | POA: Diagnosis not present

## 2020-10-22 DIAGNOSIS — K219 Gastro-esophageal reflux disease without esophagitis: Secondary | ICD-10-CM | POA: Diagnosis not present

## 2020-10-22 DIAGNOSIS — N183 Chronic kidney disease, stage 3 unspecified: Secondary | ICD-10-CM | POA: Diagnosis not present

## 2020-10-22 DIAGNOSIS — E039 Hypothyroidism, unspecified: Secondary | ICD-10-CM | POA: Diagnosis not present

## 2020-10-22 DIAGNOSIS — R69 Illness, unspecified: Secondary | ICD-10-CM | POA: Diagnosis not present

## 2020-10-22 DIAGNOSIS — I5022 Chronic systolic (congestive) heart failure: Secondary | ICD-10-CM | POA: Diagnosis not present

## 2020-11-07 ENCOUNTER — Other Ambulatory Visit: Payer: Self-pay

## 2020-11-07 ENCOUNTER — Ambulatory Visit (HOSPITAL_COMMUNITY)
Admission: RE | Admit: 2020-11-07 | Discharge: 2020-11-07 | Disposition: A | Discharge status: Home / Self Care | Payer: Medicare HMO | Source: Ambulatory Visit | Attending: Cardiology | Admitting: Cardiology

## 2020-11-07 ENCOUNTER — Encounter (HOSPITAL_COMMUNITY): Payer: Self-pay | Admitting: Cardiology

## 2020-11-07 VITALS — BP 138/70 | HR 62 | Wt 190.6 lb

## 2020-11-07 DIAGNOSIS — E785 Hyperlipidemia, unspecified: Secondary | ICD-10-CM

## 2020-11-07 DIAGNOSIS — R079 Chest pain, unspecified: Secondary | ICD-10-CM | POA: Diagnosis not present

## 2020-11-07 DIAGNOSIS — R5383 Other fatigue: Secondary | ICD-10-CM | POA: Insufficient documentation

## 2020-11-07 DIAGNOSIS — I428 Other cardiomyopathies: Secondary | ICD-10-CM | POA: Diagnosis not present

## 2020-11-07 DIAGNOSIS — Z79899 Other long term (current) drug therapy: Secondary | ICD-10-CM | POA: Diagnosis not present

## 2020-11-07 DIAGNOSIS — Z8249 Family history of ischemic heart disease and other diseases of the circulatory system: Secondary | ICD-10-CM | POA: Insufficient documentation

## 2020-11-07 DIAGNOSIS — I5022 Chronic systolic (congestive) heart failure: Secondary | ICD-10-CM | POA: Diagnosis not present

## 2020-11-07 DIAGNOSIS — I739 Peripheral vascular disease, unspecified: Secondary | ICD-10-CM | POA: Insufficient documentation

## 2020-11-07 DIAGNOSIS — I110 Hypertensive heart disease with heart failure: Secondary | ICD-10-CM | POA: Insufficient documentation

## 2020-11-07 DIAGNOSIS — Z95810 Presence of automatic (implantable) cardiac defibrillator: Secondary | ICD-10-CM | POA: Insufficient documentation

## 2020-11-07 DIAGNOSIS — G629 Polyneuropathy, unspecified: Secondary | ICD-10-CM | POA: Insufficient documentation

## 2020-11-07 DIAGNOSIS — I5032 Chronic diastolic (congestive) heart failure: Secondary | ICD-10-CM

## 2020-11-07 LAB — LIPID PANEL
Cholesterol: 211 mg/dL — ABNORMAL HIGH (ref 0–200)
HDL: 45 mg/dL (ref >40)
LDL Cholesterol: 124 mg/dL — ABNORMAL HIGH (ref 0–99)
Total CHOL/HDL Ratio: 4.7 RATIO
Triglycerides: 209 mg/dL — ABNORMAL HIGH (ref <150)
VLDL: 42 mg/dL — ABNORMAL HIGH (ref 0–40)

## 2020-11-07 LAB — BASIC METABOLIC PANEL
Anion gap: 7 (ref 5–15)
BUN: 19 mg/dL (ref 8–23)
CO2: 28 mmol/L (ref 22–32)
Calcium: 9.7 mg/dL (ref 8.9–10.3)
Chloride: 103 mmol/L (ref 98–111)
Creatinine, Ser: 1.16 mg/dL — ABNORMAL HIGH (ref 0.44–1.00)
GFR, Estimated: 46 mL/min — ABNORMAL LOW (ref >60)
Glucose, Bld: 88 mg/dL (ref 70–99)
Potassium: 4.8 mmol/L (ref 3.5–5.1)
Sodium: 138 mmol/L (ref 135–145)

## 2020-11-07 NOTE — Patient Instructions (Signed)
Take Zetia 10 mg Daily  Labs done today, your results will be available in MyChart, we will contact you for abnormal readings.  Your physician has requested that you have an echocardiogram. Echocardiography is a painless test that uses sound waves to create images of your heart. It provides your doctor with information about the size and shape of your heart and how well your heart's chambers and valves are working. This procedure takes approximately one hour. There are no restrictions for this procedure.  Your physician has requested that you have a lower or upper extremity arterial duplex. This test is an ultrasound of the arteries in the legs or arms. It looks at arterial blood flow in the legs and arms. Allow one hour for Lower and Upper Arterial scans. There are no restrictions or special instructions  Your physician recommends that you schedule a follow-up appointment in: 6 months (April 2023), **PLEASE CALL OUR OFFICE IN February TO SCHEDULE THIS APPOINTMENT  If you have any questions or concerns before your next appointment please send [place] a message through [place] or call our office at [phone].    TO LEAVE A MESSAGE FOR THE NURSE SELECT OPTION 2, PLEASE LEAVE A MESSAGE INCLUDING: YOUR NAME DATE OF BIRTH CALL BACK NUMBER REASON FOR CALL**this is important as we prioritize the call backs  YOU WILL RECEIVE A CALL BACK THE SAME DAY AS LONG AS YOU CALL BEFORE 4:00 PM  At the [facility], you and your health needs are our priority. As part of our continuing mission to provide you with exceptional heart care, we have created designated Provider Care Teams. These Care Teams include your primary Cardiologist (physician) and Advanced Practice Providers (APPs- Physician Assistants and Nurse Practitioners) who all work together to provide you with the care you need, when you need it.   You may see any of the following providers on your designated Care Team at your next follow  up: Dr Glori Bickers Dr Loralie Champagne Dr Patrice Paradise, NP Lyda Jester, [place] Ginnie Smart Audry Riles, PharmD   Please be sure to bring in all your medications bottles to every appointment.

## 2020-11-08 ENCOUNTER — Other Ambulatory Visit (HOSPITAL_COMMUNITY): Payer: Self-pay | Admitting: Cardiology

## 2020-11-08 DIAGNOSIS — I739 Peripheral vascular disease, unspecified: Secondary | ICD-10-CM

## 2020-11-10 NOTE — Progress Notes (Signed)
Patient ID: Heather Smith, female   DOB: October 25, 1932, 87 y.o.   MRN: [id number] PCP: Dr. Kenton Kingfisher HF Cardiology: Aundra Dubin  87 y.o. with history of nonischemic cardiomyopathy and chronic systolic HF.  In 9/14, she was admitted to [facility] with chest pain.  Cardiac cath was done, showing no significant coronary disease.  However, echo showed EF 30-35%.  She was started on meds for CHF, and echo was repeated in 12/14, EF was read as 20-25%.  She had a Medtonic CRT-D device placed (had baseline LBBB).  In 3/15, the LV lead was not capturing properly so device had to be reprogrammed.  She had AV optimization in 5/15.  Limited echo at that time showed improvement in EF to 40-45%.  CPX in 5/15 was submaximal but suggested low normal to mildly reduced functional capacity. Last echo in 6/16 showed EF 35-40% with diffuse hypokinesis.  CPX was done in 9/16.  This actually showed good functional capacity. She had repeat echo in 1/17, showing EF increased to 60-65%.  Most recent echo in 8/18 showed EF 50-55% with mild LV dilation and mild MR.    She was seen by Dr. Caryl Comes for frequent PVCs (symptomatic).  She was started on flecainide.  She thinks that this has helped .   Atypical chest pain in 11/19, Cardiolite was normal.   Repeat echo in 1/20 showed EF 50-55%, moderate MR.    PYP scan in 1/21 was not suggestive of TTR amyloidosis.  Echo in 5/21 showed EF 50-55%, normal RV, mild MR.   She returns for followup of CHF.  She has not been taking Lasix. Weight is stable.  She says that her energy level is poor.  She continues to have bilateral burning/numbness in her feet consistent with neuropathy.  She also says that she had abnormal ABIs done through her insurance.  She is under a lot of stress, a tree fell on her house and her husband's dementia has been progressive.  No exertional chest pain, but she has burning in her chest at night when she lies down in bed that is relieved by belching.  She does not have  dyspnea per se, just fatigue.   ECG (personally reviewed): NSR, BiV pacing  Medtronic device interrogation: Fluid index < threshold, >99% BiV pacing  Labs (9/14): TSH normal Labs (3/15): K 4.7, creatinine 1.1 Labs (5/15): K 4.9, creatinine 1.1, BNP 44 Labs (6/15): K 5.1, creatinine 1.4, pro-BNP 39 Labs (6/15): K 5.3, creatinine 1.53 Labs (12/15): K 4.4, creatinine 1.03 Labs (5/16): K 4, creatinine 1.0, BNP 34.6 Labs (6/16): K 4.3, creatinine 1.1 Labs (9/16): K 4.4, creatinine 1.16 Labs (11/16): K 4.5, creatinine 1.16 Labs (12/16): K 4.6, creatinine 1.16, BNP 22, Mg 1.9 Labs (1/17): K 4.3, creatinine 1.34, HCT 42.4, ESR normal Labs (6/17): K 4.6, creatinine 1.23, hgb 9.9, TSH normal Labs (6/18): K 4.7, creatinine 1.09, BNP 94, hgb 13.8, TSH normal Labs (12/18): K 4.7, creatinine 0.98 Labs (10/19): BNP 25, K 4.8, creatinine 1.11 Labs (7/20): K 5, creatinine 1.17 Labs (9/20): K 4.9, creatinine 1.37 Labs (4/21): TSH normal, hgb 13.5, K 4.3, creatinine 1.3 Labs (1/22): K 4.4, creatinine 1.18  PMH: 1. Chronic systolic CHF: Nonischemic cardiomyopathy.  LHC (9/14) with no significant coronary disease, EF 25-30% with global hypokinesis.  Echo (9/14) with EF 30-35%, moderate LV dilation, diffuse hypokinesis.  Echo (12/14) with EF 20-25%.  She does not drink ETOH.  She had Medtronic CRT-D device implantation in 1/15. Echo (5/15) with EF 40-45%,  mild LVH.  CPX (5/15) was submaximal with RER 0.99, peak VO2 14, VE/VCO2 37.3; low normal functional capacity may be limited by loss of BiV pacing at higher HR and by deconditioning but interpretation somewhat limited since study was submaximal.  Echo (6/16) with EF 35-40%, severe LV dilation, diffuse hypokinesis, mild MR.  CPX (9/16) with peak VO2 17.7, VE/VCO2 slope 32.3, RER 1.11 => excellent functional capacity. Echo (11/17) with EF 60-65%.  - Echo (8/18): EF 50-55%, mildly dilated LV, mild MR.  - Echo (1/20): EF 50-55%, moderate MR.  - PYP scan  (1/21): H/CL 1.0, grade 1.  Not suggestive of TTR amyloidosis.  - Echo (5/21): EF 50-55%, normal RV, mild MR. 2. Back pain s/p lumbar laminectomy 3. OA: Knees. Left TKR in 6/17.  4. Hyperlipidemia 5. Depression 6. PVCs 7. Peripheral neuropathy: Uncertain etiology.  She is not a diabetic.  8. Lung nodule: Last CT chest in 1/19 showed stable 6 mm LLL nodule, no further followup recommended.  9. Chest pain: Cardiolite (11/19) with EF 74%, no evidence for ischemia/infarction.   SH: Lives [place], married, never smoked, no ETOH.    FH: Mother with MI at 31, possible ischemic cardiomyopathy.  Father with cardiomyopathy, died at 68.  She has 5 brothers, none with significant coronary disease.    ROS: All systems reviewed and negative except as per HPI.   Current Outpatient Medications  Medication Sig Dispense Refill   acetaminophen (TYLENOL) 325 MG tablet Take 325 mg by mouth every 6 (six) hours as needed for moderate pain or headache.     Ascorbic Acid (VITAMIN C) 1000 MG tablet Take 1,000 mg by mouth daily.     carvedilol (COREG) 6.25 MG tablet TAKE ONE TABLET BY MOUTH AT BREAKFAST AND AT BEDTIME 180 tablet 1   Coenzyme Q10-Fish Oil-Vit E (CO-Q 10 OMEGA-3 FISH OIL PO) Take 1 capsule by mouth daily.     diphenhydramine-acetaminophen (TYLENOL PM) 25-500 MG TABS tablet Take 1 tablet by mouth at bedtime as needed (sleep).     DULoxetine (CYMBALTA) 30 MG capsule Take 30 mg by mouth daily.      ezetimibe (ZETIA) 10 MG tablet Take 1 tablet (10 mg total) by mouth daily. 90 tablet 3   flecainide (TAMBOCOR) 50 MG tablet Take 1 tablet (50 mg total) by mouth 2 (two) times daily. 180 tablet 3   furosemide (LASIX) 20 MG tablet TAKE 1 TABLET (20 MG TOTAL) BY MOUTH AS NEEDED FOR FLUID (NO MORE THAN TWICE PER WEEK). 8 tablet 11   levothyroxine (SYNTHROID, LEVOTHROID) 50 MCG tablet Take 50 mcg by mouth daily.     Magnesium 250 MG TABS Take 250 mg by mouth daily.     Menthol, Topical Analgesic, (ICY  HOT EX) Apply 1 application topically at bedtime.     multivitamin (THERAGRAN) per tablet Take 1 tablet by mouth daily.     pantoprazole (PROTONIX) 40 MG tablet Take 40 mg by mouth daily.     Polyethyl Glycol-Propyl Glycol (SYSTANE OP) Place 1 drop into both eyes daily as needed (dry eyes).     sacubitril-valsartan (ENTRESTO) 24-26 MG Take 1 tablet by mouth 2 (two) times daily. 180 tablet 3   spironolactone (ALDACTONE) 25 MG tablet Take 1 tablet (25 mg total) by mouth daily. 90 tablet 3   vitamin B-12 (CYANOCOBALAMIN) 1000 MCG tablet Take 1,000 mcg by mouth daily.     Current Facility-Administered Medications  Medication Dose Route Frequency Provider Last Rate Last Admin  triamcinolone acetonide (KENALOG) 10 MG/ML injection 10 mg  10 mg Other Once Regal, Norman S, DPM       triamcinolone acetonide (KENALOG) 10 MG/ML injection 10 mg  10 mg Other Once Regal, Norman S, DPM       triamcinolone acetonide (KENALOG) 10 MG/ML injection 10 mg  10 mg Other Once Wallene Huh, DPM       Vitals:   11/07/20 1128  BP: 138/70  Pulse: 62  SpO2: 96%  Weight: 86.5 kg (190 lb 9.6 oz)   Wt Readings from Last 3 Encounters:  11/07/20 86.5 kg (190 lb 9.6 oz)  04/29/20 86.6 kg (191 lb)  02/08/20 86.5 kg (190 lb 9.6 oz)   General: NAD Neck: No JVD, no thyromegaly or thyroid nodule.  Lungs: Clear to auscultation bilaterally with normal respiratory effort. CV: Nondisplaced PMI.  Heart regular S1/S2, no S3/S4, no murmur.  No peripheral edema.  No carotid bruit.  Difficult to palpate pedal pulses.  Abdomen: Soft, nontender, no hepatosplenomegaly, no distention.  Skin: Intact without lesions or rashes.  Neurologic: Alert and oriented x 3.  Psych: Normal affect. Extremities: No clubbing or cyanosis.  HEENT: Normal.   Assessment/Plan: 1. Chronic systolic CHF: Nonischemic cardiomyopathy s/p Medtronic CRT-D. EF 35-40% (6/16). She does have a family history of cardiomyopathy of uncertain etiology (father) but  none of her siblings developed a cardiomyopathy.  Familial cardiomyopathy is a consideration.  Viral myocarditis is also a possible etiology.  Echo in 1/17 showed improvement in EF to 60-65%, echo in 8/18 showed EF 50-55% and echo in 1/20 showed stable EF 50-55%.  PYP scan not suggestive of TTR amyloidosis. Echo in 5/21 showed EF 50-55%, normal RV, mild MR. NYHA II symptoms but still with prominent fatigue  She is not volume overloaded on exam or by Optivol.   - Continue current Entresto, Coreg, and spironolactone.  BMET today.  - She does not appear to need Lasix.  - I will arrange for repeat echo.  2. PVCs: Followed by EP, now on flecainide. Minimal palpitations now.  - Continue flecainide 50 mg bid.  Will need nodal blocking agent while on flecainide, she is on Coreg.  3. Peripheral neuropathy: Bothersome peripheral neuropathy. PYP scan not suggestive of TTR amyloidosis.                     4. Fatigue: Chronic, has been present for years.  5. HTN: Reasonable control.  6. PAD: Abnormal ABIs on insurance screening.  She has peripheral neuropathy symptoms but no definite claudication.  Difficult to palpate pedal pulses.  - I will arrange for formal peripheral arterial dopplers.  - Goal LDL < 70, I will restart Zetia 10 mg daily (cannot take statins).  If LDL not < 70 on followup lipid profile, refer for Repatha.  7. Chest pain: This sounds like GERD (when lying down, relieved by belching, not exertional).  - Try OTC reflux med first.   Followup in 6 months with APP.                                                        Loralie Champagne 11/10/2020

## 2020-11-15 ENCOUNTER — Other Ambulatory Visit: Payer: Self-pay

## 2020-11-15 ENCOUNTER — Ambulatory Visit (HOSPITAL_COMMUNITY)
Admission: RE | Admit: 2020-11-15 | Discharge: 2020-11-15 | Disposition: A | Discharge status: Home / Self Care | Payer: Medicare HMO | Source: Ambulatory Visit | Attending: Internal Medicine | Admitting: Internal Medicine

## 2020-11-15 DIAGNOSIS — I739 Peripheral vascular disease, unspecified: Secondary | ICD-10-CM | POA: Diagnosis not present

## 2020-11-19 DIAGNOSIS — K219 Gastro-esophageal reflux disease without esophagitis: Secondary | ICD-10-CM | POA: Diagnosis not present

## 2020-11-19 DIAGNOSIS — M1990 Unspecified osteoarthritis, unspecified site: Secondary | ICD-10-CM | POA: Diagnosis not present

## 2020-11-19 DIAGNOSIS — E039 Hypothyroidism, unspecified: Secondary | ICD-10-CM | POA: Diagnosis not present

## 2020-11-19 DIAGNOSIS — E782 Mixed hyperlipidemia: Secondary | ICD-10-CM | POA: Diagnosis not present

## 2020-11-19 DIAGNOSIS — I10 Essential (primary) hypertension: Secondary | ICD-10-CM | POA: Diagnosis not present

## 2020-11-19 DIAGNOSIS — I5022 Chronic systolic (congestive) heart failure: Secondary | ICD-10-CM | POA: Diagnosis not present

## 2020-11-19 DIAGNOSIS — I5020 Unspecified systolic (congestive) heart failure: Secondary | ICD-10-CM | POA: Diagnosis not present

## 2020-11-19 DIAGNOSIS — R69 Illness, unspecified: Secondary | ICD-10-CM | POA: Diagnosis not present

## 2020-11-19 DIAGNOSIS — I6789 Other cerebrovascular disease: Secondary | ICD-10-CM | POA: Diagnosis not present

## 2020-12-05 DIAGNOSIS — M5412 Radiculopathy, cervical region: Secondary | ICD-10-CM | POA: Diagnosis not present

## 2020-12-06 ENCOUNTER — Ambulatory Visit (HOSPITAL_COMMUNITY)
Admission: RE | Admit: 2020-12-06 | Discharge: 2020-12-06 | Disposition: A | Discharge status: Home / Self Care | Payer: Medicare HMO | Source: Ambulatory Visit | Attending: Cardiology | Admitting: Cardiology

## 2020-12-06 ENCOUNTER — Other Ambulatory Visit: Payer: Self-pay

## 2020-12-06 DIAGNOSIS — Z95810 Presence of automatic (implantable) cardiac defibrillator: Secondary | ICD-10-CM | POA: Insufficient documentation

## 2020-12-06 DIAGNOSIS — E785 Hyperlipidemia, unspecified: Secondary | ICD-10-CM | POA: Insufficient documentation

## 2020-12-06 DIAGNOSIS — I447 Left bundle-branch block, unspecified: Secondary | ICD-10-CM | POA: Diagnosis not present

## 2020-12-06 DIAGNOSIS — I5032 Chronic diastolic (congestive) heart failure: Secondary | ICD-10-CM

## 2020-12-06 DIAGNOSIS — I110 Hypertensive heart disease with heart failure: Secondary | ICD-10-CM | POA: Diagnosis not present

## 2020-12-06 LAB — ECHOCARDIOGRAM COMPLETE
Area-P 1/2: 2.73 cm2
S' Lateral: 3.1 cm

## 2020-12-06 NOTE — Progress Notes (Signed)
  Echocardiogram 2D Echocardiogram has been performed.  Heather Smith 12/06/2020, 3:31 PM

## 2020-12-19 ENCOUNTER — Ambulatory Visit (INDEPENDENT_AMBULATORY_CARE_PROVIDER_SITE_OTHER): Payer: Medicare HMO

## 2020-12-19 DIAGNOSIS — I428 Other cardiomyopathies: Secondary | ICD-10-CM | POA: Diagnosis not present

## 2020-12-19 LAB — CUP PACEART REMOTE DEVICE CHECK
Battery Remaining Longevity: 29 mo
Battery Voltage: 2.94 V
Brady Statistic AP VP Percent: 50.21 %
Brady Statistic AP VS Percent: 0.59 %
Brady Statistic AS VP Percent: 48.41 %
Brady Statistic AS VS Percent: 0.79 %
Brady Statistic RA Percent Paced: 50.4 %
Brady Statistic RV Percent Paced: 97.78 %
Date Time Interrogation Session: 20221117042205
HighPow Impedance: 77 Ohm
Implantable Lead Implant Date: 20150121
Implantable Lead Implant Date: 20150121
Implantable Lead Implant Date: 20150121
Implantable Lead Location: 753858
Implantable Lead Location: 753859
Implantable Lead Location: 753860
Implantable Lead Model: 4298
Implantable Lead Model: 5076
Implantable Pulse Generator Implant Date: 20200717
Lead Channel Impedance Value: 193.707
Lead Channel Impedance Value: 201.488
Lead Channel Impedance Value: 201.488
Lead Channel Impedance Value: 218.087
Lead Channel Impedance Value: 218.087
Lead Channel Impedance Value: 361 Ohm
Lead Channel Impedance Value: 418 Ohm
Lead Channel Impedance Value: 456 Ohm
Lead Channel Impedance Value: 456 Ohm
Lead Channel Impedance Value: 456 Ohm
Lead Channel Impedance Value: 475 Ohm
Lead Channel Impedance Value: 551 Ohm
Lead Channel Impedance Value: 589 Ohm
Lead Channel Impedance Value: 646 Ohm
Lead Channel Impedance Value: 646 Ohm
Lead Channel Impedance Value: 703 Ohm
Lead Channel Impedance Value: 703 Ohm
Lead Channel Impedance Value: 722 Ohm
Lead Channel Pacing Threshold Amplitude: 0.625 V
Lead Channel Pacing Threshold Amplitude: 0.75 V
Lead Channel Pacing Threshold Amplitude: 2.5 V
Lead Channel Pacing Threshold Pulse Width: 0.4 ms
Lead Channel Pacing Threshold Pulse Width: 0.4 ms
Lead Channel Pacing Threshold Pulse Width: 1 ms
Lead Channel Sensing Intrinsic Amplitude: 14.125 mV
Lead Channel Sensing Intrinsic Amplitude: 14.125 mV
Lead Channel Sensing Intrinsic Amplitude: 3.375 mV
Lead Channel Sensing Intrinsic Amplitude: 3.375 mV
Lead Channel Setting Pacing Amplitude: 1.5 V
Lead Channel Setting Pacing Amplitude: 2 V
Lead Channel Setting Pacing Amplitude: 3 V
Lead Channel Setting Pacing Pulse Width: 0.4 ms
Lead Channel Setting Pacing Pulse Width: 1 ms
Lead Channel Setting Sensing Sensitivity: 0.3 mV

## 2020-12-20 DIAGNOSIS — I10 Essential (primary) hypertension: Secondary | ICD-10-CM | POA: Diagnosis not present

## 2020-12-20 DIAGNOSIS — R69 Illness, unspecified: Secondary | ICD-10-CM | POA: Diagnosis not present

## 2020-12-20 DIAGNOSIS — M1990 Unspecified osteoarthritis, unspecified site: Secondary | ICD-10-CM | POA: Diagnosis not present

## 2020-12-20 DIAGNOSIS — E039 Hypothyroidism, unspecified: Secondary | ICD-10-CM | POA: Diagnosis not present

## 2020-12-20 DIAGNOSIS — N183 Chronic kidney disease, stage 3 unspecified: Secondary | ICD-10-CM | POA: Diagnosis not present

## 2020-12-20 DIAGNOSIS — K219 Gastro-esophageal reflux disease without esophagitis: Secondary | ICD-10-CM | POA: Diagnosis not present

## 2020-12-20 DIAGNOSIS — I5022 Chronic systolic (congestive) heart failure: Secondary | ICD-10-CM | POA: Diagnosis not present

## 2020-12-20 DIAGNOSIS — E782 Mixed hyperlipidemia: Secondary | ICD-10-CM | POA: Diagnosis not present

## 2020-12-20 DIAGNOSIS — I6789 Other cerebrovascular disease: Secondary | ICD-10-CM | POA: Diagnosis not present

## 2020-12-30 NOTE — Progress Notes (Signed)
Remote ICD transmission.   

## 2021-01-16 ENCOUNTER — Encounter (HOSPITAL_COMMUNITY): Payer: Self-pay | Admitting: Emergency Medicine

## 2021-01-16 ENCOUNTER — Other Ambulatory Visit: Payer: Self-pay

## 2021-01-16 ENCOUNTER — Emergency Department (HOSPITAL_COMMUNITY): Payer: Medicare HMO

## 2021-01-16 ENCOUNTER — Inpatient Hospital Stay (HOSPITAL_COMMUNITY)
Admission: EM | Admit: 2021-01-16 | Discharge: 2021-01-20 | DRG: 312 | Disposition: A | Discharge status: Home / Self Care | Payer: Medicare HMO | Attending: Internal Medicine | Admitting: Internal Medicine

## 2021-01-16 DIAGNOSIS — E785 Hyperlipidemia, unspecified: Secondary | ICD-10-CM | POA: Diagnosis present

## 2021-01-16 DIAGNOSIS — Z888 Allergy status to other drugs, medicaments and biological substances status: Secondary | ICD-10-CM

## 2021-01-16 DIAGNOSIS — Z96652 Presence of left artificial knee joint: Secondary | ICD-10-CM | POA: Diagnosis present

## 2021-01-16 DIAGNOSIS — Z79899 Other long term (current) drug therapy: Secondary | ICD-10-CM

## 2021-01-16 DIAGNOSIS — N1832 Chronic kidney disease, stage 3b: Secondary | ICD-10-CM | POA: Diagnosis present

## 2021-01-16 DIAGNOSIS — I951 Orthostatic hypotension: Principal | ICD-10-CM | POA: Diagnosis present

## 2021-01-16 DIAGNOSIS — I130 Hypertensive heart and chronic kidney disease with heart failure and stage 1 through stage 4 chronic kidney disease, or unspecified chronic kidney disease: Secondary | ICD-10-CM | POA: Diagnosis present

## 2021-01-16 DIAGNOSIS — U071 COVID-19: Secondary | ICD-10-CM

## 2021-01-16 DIAGNOSIS — D6959 Other secondary thrombocytopenia: Secondary | ICD-10-CM | POA: Diagnosis present

## 2021-01-16 DIAGNOSIS — I959 Hypotension, unspecified: Secondary | ICD-10-CM

## 2021-01-16 DIAGNOSIS — I10 Essential (primary) hypertension: Secondary | ICD-10-CM | POA: Diagnosis present

## 2021-01-16 DIAGNOSIS — I5022 Chronic systolic (congestive) heart failure: Secondary | ICD-10-CM | POA: Diagnosis not present

## 2021-01-16 DIAGNOSIS — I4892 Unspecified atrial flutter: Secondary | ICD-10-CM | POA: Diagnosis present

## 2021-01-16 DIAGNOSIS — F419 Anxiety disorder, unspecified: Secondary | ICD-10-CM | POA: Diagnosis present

## 2021-01-16 DIAGNOSIS — E559 Vitamin D deficiency, unspecified: Secondary | ICD-10-CM | POA: Diagnosis present

## 2021-01-16 DIAGNOSIS — R55 Syncope and collapse: Secondary | ICD-10-CM | POA: Diagnosis present

## 2021-01-16 DIAGNOSIS — R001 Bradycardia, unspecified: Secondary | ICD-10-CM | POA: Diagnosis not present

## 2021-01-16 DIAGNOSIS — E7800 Pure hypercholesterolemia, unspecified: Secondary | ICD-10-CM | POA: Diagnosis present

## 2021-01-16 DIAGNOSIS — F32A Depression, unspecified: Secondary | ICD-10-CM | POA: Diagnosis present

## 2021-01-16 DIAGNOSIS — I471 Supraventricular tachycardia: Secondary | ICD-10-CM | POA: Diagnosis not present

## 2021-01-16 DIAGNOSIS — Z95810 Presence of automatic (implantable) cardiac defibrillator: Secondary | ICD-10-CM

## 2021-01-16 DIAGNOSIS — K219 Gastro-esophageal reflux disease without esophagitis: Secondary | ICD-10-CM | POA: Diagnosis present

## 2021-01-16 DIAGNOSIS — Z8349 Family history of other endocrine, nutritional and metabolic diseases: Secondary | ICD-10-CM

## 2021-01-16 DIAGNOSIS — I5032 Chronic diastolic (congestive) heart failure: Secondary | ICD-10-CM | POA: Diagnosis present

## 2021-01-16 DIAGNOSIS — I2510 Atherosclerotic heart disease of native coronary artery without angina pectoris: Secondary | ICD-10-CM | POA: Diagnosis present

## 2021-01-16 DIAGNOSIS — I5042 Chronic combined systolic (congestive) and diastolic (congestive) heart failure: Secondary | ICD-10-CM | POA: Diagnosis present

## 2021-01-16 DIAGNOSIS — E039 Hypothyroidism, unspecified: Secondary | ICD-10-CM | POA: Diagnosis present

## 2021-01-16 DIAGNOSIS — E860 Dehydration: Secondary | ICD-10-CM

## 2021-01-16 DIAGNOSIS — N179 Acute kidney failure, unspecified: Secondary | ICD-10-CM

## 2021-01-16 DIAGNOSIS — R4182 Altered mental status, unspecified: Secondary | ICD-10-CM | POA: Diagnosis not present

## 2021-01-16 DIAGNOSIS — Z79890 Hormone replacement therapy (postmenopausal): Secondary | ICD-10-CM

## 2021-01-16 DIAGNOSIS — M961 Postlaminectomy syndrome, not elsewhere classified: Secondary | ICD-10-CM | POA: Diagnosis present

## 2021-01-16 DIAGNOSIS — E1122 Type 2 diabetes mellitus with diabetic chronic kidney disease: Secondary | ICD-10-CM | POA: Diagnosis present

## 2021-01-16 DIAGNOSIS — Z83438 Family history of other disorder of lipoprotein metabolism and other lipidemia: Secondary | ICD-10-CM

## 2021-01-16 DIAGNOSIS — Z8249 Family history of ischemic heart disease and other diseases of the circulatory system: Secondary | ICD-10-CM

## 2021-01-16 HISTORY — DX: Chronic kidney disease, stage 3a: N18.31

## 2021-01-16 HISTORY — DX: Polyneuropathy, unspecified: G62.9

## 2021-01-16 HISTORY — DX: Ventricular premature depolarization: I49.3

## 2021-01-16 HISTORY — DX: Presence of automatic (implantable) cardiac defibrillator: Z95.810

## 2021-01-16 HISTORY — DX: Chronic systolic (congestive) heart failure: I50.22

## 2021-01-16 LAB — D-DIMER, QUANTITATIVE: D-Dimer, Quant: 3.03 ug/mL-FEU — ABNORMAL HIGH (ref 0.00–0.50)

## 2021-01-16 LAB — URINALYSIS, ROUTINE W REFLEX MICROSCOPIC
Bilirubin Urine: NEGATIVE
Glucose, UA: NEGATIVE mg/dL
Hgb urine dipstick: NEGATIVE
Leukocytes,Ua: NEGATIVE
Nitrite: NEGATIVE
Specific Gravity, Urine: >=1.03 (ref 1.005–1.030)
pH: 5 (ref 5.0–8.0)

## 2021-01-16 LAB — BASIC METABOLIC PANEL
Anion gap: 8 (ref 5–15)
BUN: 24 mg/dL — ABNORMAL HIGH (ref 8–23)
CO2: 23 mmol/L (ref 22–32)
Calcium: 9 mg/dL (ref 8.9–10.3)
Chloride: 103 mmol/L (ref 98–111)
Creatinine, Ser: 1.53 mg/dL — ABNORMAL HIGH (ref 0.44–1.00)
GFR, Estimated: 33 mL/min — ABNORMAL LOW (ref >60)
Glucose, Bld: 201 mg/dL — ABNORMAL HIGH (ref 70–99)
Potassium: 4.3 mmol/L (ref 3.5–5.1)
Sodium: 134 mmol/L — ABNORMAL LOW (ref 135–145)

## 2021-01-16 LAB — CBC
HCT: 37 % (ref 36.0–46.0)
HCT: 38.1 % (ref 36.0–46.0)
Hemoglobin: 12.4 g/dL (ref 12.0–15.0)
Hemoglobin: 12.6 g/dL (ref 12.0–15.0)
MCH: 31.3 pg (ref 26.0–34.0)
MCH: 31.5 pg (ref 26.0–34.0)
MCHC: 33.5 g/dL (ref 30.0–36.0)
MCHC: 33.1 g/dL (ref 30.0–36.0)
MCV: 93.4 fL (ref 80.0–100.0)
MCV: 95.3 fL (ref 80.0–100.0)
Platelets: 140 10*3/uL — ABNORMAL LOW (ref 150–400)
Platelets: 112 10*3/uL — ABNORMAL LOW (ref 150–400)
RBC: 3.96 MIL/uL (ref 3.87–5.11)
RBC: 4 MIL/uL (ref 3.87–5.11)
RDW: 13.2 % (ref 11.5–15.5)
RDW: 13.4 % (ref 11.5–15.5)
WBC: 5.6 10*3/uL (ref 4.0–10.5)
WBC: 5.7 10*3/uL (ref 4.0–10.5)
nRBC: 0 % (ref 0.0–0.2)
nRBC: 0 % (ref 0.0–0.2)

## 2021-01-16 LAB — BLOOD GAS, VENOUS
Acid-Base Excess: 1 mmol/L (ref 0.0–2.0)
Bicarbonate: 26.8 mmol/L (ref 20.0–28.0)
O2 Saturation: 35.4 %
Patient temperature: 98.6
pCO2, Ven: 50.2 mmHg (ref 44.0–60.0)
pH, Ven: 7.347 (ref 7.250–7.430)
pO2, Ven: <32 mmHg — CL (ref 32.0–45.0)

## 2021-01-16 LAB — FERRITIN: Ferritin: 65 ng/mL (ref 11–307)

## 2021-01-16 LAB — RESP PANEL BY RT-PCR (FLU A&B, COVID) ARPGX2
Influenza A by PCR: NEGATIVE
Influenza B by PCR: NEGATIVE
SARS Coronavirus 2 by RT PCR: POSITIVE — AB

## 2021-01-16 LAB — TROPONIN I (HIGH SENSITIVITY)
Troponin I (High Sensitivity): 8 ng/L (ref <18)
Troponin I (High Sensitivity): 8 ng/L (ref <18)

## 2021-01-16 LAB — BRAIN NATRIURETIC PEPTIDE: B Natriuretic Peptide: 221.9 pg/mL — ABNORMAL HIGH (ref 0.0–100.0)

## 2021-01-16 LAB — CREATININE, SERUM
Creatinine, Ser: 1.41 mg/dL — ABNORMAL HIGH (ref 0.44–1.00)
GFR, Estimated: 36 mL/min — ABNORMAL LOW (ref >60)

## 2021-01-16 MED ORDER — ALBUTEROL SULFATE HFA 108 (90 BASE) MCG/ACT IN AERS
2.0000 | INHALATION_SPRAY | Freq: Two times a day (BID) | RESPIRATORY_TRACT | Status: DC
  Administered 2021-01-17 – 2021-01-20 (×7): 2 via RESPIRATORY_TRACT
  Filled 2021-01-16: qty 6.7

## 2021-01-16 MED ORDER — ACETAMINOPHEN 325 MG PO TABS
650.0000 mg | ORAL_TABLET | Freq: Four times a day (QID) | ORAL | Status: DC | PRN
  Administered 2021-01-16 – 2021-01-19 (×4): 650 mg via ORAL
  Filled 2021-01-16 (×4): qty 2

## 2021-01-16 MED ORDER — ALBUTEROL SULFATE HFA 108 (90 BASE) MCG/ACT IN AERS
2.0000 | INHALATION_SPRAY | Freq: Four times a day (QID) | RESPIRATORY_TRACT | Status: DC
  Administered 2021-01-16: 2 via RESPIRATORY_TRACT
  Filled 2021-01-16: qty 6.7

## 2021-01-16 MED ORDER — SODIUM CHLORIDE 0.9 % IV SOLN: INTRAVENOUS | Status: AC

## 2021-01-16 MED ORDER — SODIUM CHLORIDE 0.9 % IV BOLUS
1000.0000 mL | Freq: Once | INTRAVENOUS | Status: AC
  Administered 2021-01-16: 1000 mL via INTRAVENOUS

## 2021-01-16 MED ORDER — DULOXETINE HCL 30 MG PO CPEP
30.0000 mg | ORAL_CAPSULE | Freq: Every day | ORAL | Status: DC
  Administered 2021-01-17 – 2021-01-20 (×4): 30 mg via ORAL
  Filled 2021-01-16 (×5): qty 1

## 2021-01-16 MED ORDER — ASCORBIC ACID 500 MG PO TABS
1000.0000 mg | ORAL_TABLET | Freq: Every day | ORAL | Status: DC
  Administered 2021-01-16 – 2021-01-20 (×5): 1000 mg via ORAL
  Filled 2021-01-16 (×5): qty 2

## 2021-01-16 MED ORDER — CARVEDILOL 6.25 MG PO TABS
6.2500 mg | ORAL_TABLET | Freq: Two times a day (BID) | ORAL | Status: DC
  Filled 2021-01-16: qty 1

## 2021-01-16 MED ORDER — GUAIFENESIN-DM 100-10 MG/5ML PO SYRP: 10.0000 mL | ORAL_SOLUTION | ORAL | Status: DC | PRN

## 2021-01-16 MED ORDER — ALBUTEROL SULFATE HFA 108 (90 BASE) MCG/ACT IN AERS: 2.0000 | INHALATION_SPRAY | RESPIRATORY_TRACT | Status: DC | PRN

## 2021-01-16 MED ORDER — ACETAMINOPHEN 500 MG PO TABS
1000.0000 mg | ORAL_TABLET | Freq: Once | ORAL | Status: AC
  Administered 2021-01-16: 1000 mg via ORAL
  Filled 2021-01-16: qty 2

## 2021-01-16 MED ORDER — MOLNUPIRAVIR EUA 200MG CAPSULE
4.0000 | ORAL_CAPSULE | Freq: Two times a day (BID) | ORAL | Status: DC
  Administered 2021-01-17 – 2021-01-20 (×8): 800 mg via ORAL
  Filled 2021-01-16 (×2): qty 4

## 2021-01-16 MED ORDER — HYDROCOD POLST-CPM POLST ER 10-8 MG/5ML PO SUER
5.0000 mL | Freq: Two times a day (BID) | ORAL | Status: DC | PRN
  Administered 2021-01-17: 5 mL via ORAL
  Filled 2021-01-16: qty 5

## 2021-01-16 MED ORDER — EZETIMIBE 10 MG PO TABS
10.0000 mg | ORAL_TABLET | Freq: Every day | ORAL | Status: DC
  Administered 2021-01-17 – 2021-01-20 (×4): 10 mg via ORAL
  Filled 2021-01-16 (×4): qty 1

## 2021-01-16 MED ORDER — NIRMATRELVIR/RITONAVIR (PAXLOVID)TABLET: 3.0000 | ORAL_TABLET | Freq: Two times a day (BID) | ORAL | Status: DC

## 2021-01-16 MED ORDER — LEVOTHYROXINE SODIUM 50 MCG PO TABS
50.0000 ug | ORAL_TABLET | Freq: Every day | ORAL | Status: DC
Start: 2021-01-17 — End: 2021-01-20
  Administered 2021-01-17 – 2021-01-20 (×4): 50 ug via ORAL
  Filled 2021-01-16 (×4): qty 1

## 2021-01-16 MED ORDER — FLECAINIDE ACETATE 50 MG PO TABS
50.0000 mg | ORAL_TABLET | Freq: Two times a day (BID) | ORAL | Status: DC
  Administered 2021-01-17 – 2021-01-20 (×7): 50 mg via ORAL
  Filled 2021-01-16 (×8): qty 1

## 2021-01-16 MED ORDER — ENOXAPARIN SODIUM 40 MG/0.4ML IJ SOSY
40.0000 mg | PREFILLED_SYRINGE | INTRAMUSCULAR | Status: DC
  Administered 2021-01-16 – 2021-01-19 (×4): 40 mg via SUBCUTANEOUS
  Filled 2021-01-16 (×4): qty 0.4

## 2021-01-16 NOTE — H&P (Signed)
History and Physical    Heather Smith:[id number] DOB: 06-12-1932 DOA: 01/16/2021  PCP: Shirline Frees, MD  Patient coming from: Home  Chief Complaint: Syncope  HPI: Heather Smith is a 87 y.o. female with medical history significant of CHF, hypertension, hyperlipidemia, hypothyroid, nonischemic CAD who presents to the emergency department with syncopal episode on the day of hospital visit.  Patient recalled trying to get up early in the morning which point patient noted feeling lightheaded and weak.  Over the course of the day, symptoms progressively became worse and ultimately patient did syncopized.  This prompted patient to be brought to the emergency department by her family member.  ED Course: In the emergency department, patient was noted to have a creatinine of 1.53 with presenting blood pressure of 84/47.  Patient was noted to have a temp of 102.9 F.  Patient did receive IV fluids with improvement of blood pressures.  Chest x-ray was obtained which was found to be unremarkable.  Head CT was found to be unremarkable.  Given concerns of syncope, hospital service consulted for consideration for medical admission.  Of note, patient's pacemaker was interrogated per EDP which was reviewed with cardiology over the phone.  Of note, patient is noted to have been vaccinated for COVID x3, patient does not recall being around any sick contacts.  Patient is around family members who themselves do not report to be ill.  Review of Systems:  Review of Systems  Constitutional:  Positive for fever and malaise/fatigue.  HENT:  Negative for ear discharge.   Eyes:  Negative for photophobia and pain.  Respiratory:  Negative for hemoptysis, sputum production, shortness of breath and stridor.   Cardiovascular:  Negative for orthopnea and claudication.  Gastrointestinal:  Negative for constipation, diarrhea, nausea and vomiting.  Genitourinary:  Negative for frequency and hematuria.   Musculoskeletal:  Negative for back pain and neck pain.  Neurological:  Positive for dizziness. Negative for tremors and weakness.  Psychiatric/Behavioral:  Negative for hallucinations and memory loss. The patient is not nervous/anxious.    Past Medical History:  Diagnosis Date   Chest pain    a. Nl cath in the 90's;  b. 04/2005: Myoview: subtle areao of ? reversibility in apical segment of anteromedial LV - ? attenuation, EF 54%;  c. 05/2012 MV: small, fixed mild apical septal perfusion defect, ? attenuation due to lbbb, no ischemia.   CHF (congestive heart failure) (HCC)    Complication of anesthesia    difficult to wake up and blood pressure drops   GERD (gastroesophageal reflux disease)    Hypercholesterolemia    Hypertension    Hypothyroidism    Left bundle branch block    LV lead partial dislodgment 05/30/2013   Neurogenic claudication due to lumbar spinal stenosis    Non-ischemic cardiomyopathy ([place])    LHC 10/04/12: Normal coronary arteries, EF 25-30% with global HK.  Echocardiogram 10/05/12: EF 30-35%, diffuse HK, moderate LAE. Echocardiogram (01/2013): Lateral to septal wall dyssynchrony EF 20-25%, diffuse HK   Osteoarthritis    "about all over" (02/22/2013)   Spinal stenosis    Vitamin D deficiency     Past Surgical History:  Procedure Laterality Date   APPENDECTOMY     BI-VENTRICULAR IMPLANTABLE CARDIOVERTER DEFIBRILLATOR N/A 02/22/2013   Procedure: BI-VENTRICULAR IMPLANTABLE CARDIOVERTER DEFIBRILLATOR  (CRT-D);  Surgeon: Deboraha Sprang, MD;  Location: [facility] CATH LAB;  Service: Cardiovascular;  Laterality: N/A;   BIV ICD GENERATOR CHANGEOUT N/A 08/19/2018   Procedure: BIV ICD GENERATOR  CHANGEOUT;  Surgeon: Deboraha Sprang, MD;  Location: [place] CV LAB;  Service: Cardiovascular;  Laterality: N/A;   BLEPHAROPLASTY Bilateral    EXCISIONAL HEMORRHOIDECTOMY     FOOT ARTHRODESIS, MODIFIED MCBRIDE Bilateral    KNEE ARTHROSCOPY Right    LEFT HEART CATHETERIZATION WITH CORONARY  ANGIOGRAM N/A 10/04/2012   Procedure: LEFT HEART CATHETERIZATION WITH CORONARY ANGIOGRAM;  Surgeon: Peter M Martinique, MD;  Location: [facility] CATH LAB;  Service: Cardiovascular;  Laterality: N/A;   LUMBAR LAMINECTOMY  11/03/2006    Bilateral 3, 4, 5 laminectomy, partial L2, decompression of the thecal sac, foraminotomy, posterolateral arthrodesis L3-S1 with autograft   -- SURGEON:  Leeroy Cha, M.D.    TONSILLECTOMY     TOTAL KNEE ARTHROPLASTY Left 07/15/2015   Procedure: LEFT TOTAL KNEE ARTHROPLASTY;  Surgeon: Frederik Pear, MD;  Location: [place];  Service: Orthopedics;  Laterality: Left;   TUBAL LIGATION     VAGINAL HYSTERECTOMY       reports that she has never smoked. She has never used smokeless tobacco. She reports that she does not drink alcohol and does not use drugs.  Allergies  Allergen Reactions   Statins Other (See Comments)    "Makes her bones hurt and very sore"   Niaspan [Niacin Er] Other (See Comments)    High doses, causes hot flashes & aching    Simvastatin Other (See Comments)   Benadryl [Diphenhydramine] Other (See Comments)    Makes pt feel jittery    Dilaudid [Hydromorphone Hcl] Itching    Family History  Problem Relation Age of Onset   Aneurysm Father        died @ 78.   Heart attack Mother        died @ 59.   Thyroid disease Mother    Lung cancer Mother    Hyperlipidemia Mother    Aneurysm Brother        has had an Aneurysm x3   Colon cancer Brother    Bone cancer Brother    Lung cancer Brother    Stomach cancer Brother    Neuropathy Neg Hx     Prior to Admission medications   Medication Sig Start Date End Date Taking? Authorizing Provider  acetaminophen (TYLENOL) 325 MG tablet Take 325 mg by mouth every 6 (six) hours as needed for moderate pain or headache.   Yes [provider]  Ascorbic Acid (VITAMIN C) 1000 MG tablet Take 1,000 mg by mouth daily.   Yes [provider]  carvedilol (COREG) 6.25 MG tablet TAKE ONE TABLET BY MOUTH AT  BREAKFAST AND AT BEDTIME Patient taking differently: Take 6.25 mg by mouth 2 (two) times daily with a meal. 09/19/20  Yes Larey Dresser, MD  diphenhydramine-acetaminophen (TYLENOL PM) 25-500 MG TABS tablet Take 1 tablet by mouth at bedtime as needed (sleep).   Yes [provider]  DULoxetine (CYMBALTA) 30 MG capsule Take 30 mg by mouth daily.  05/21/16  Yes [provider]  ezetimibe (ZETIA) 10 MG tablet Take 1 tablet (10 mg total) by mouth daily. 02/12/20 11/07/21 Yes Larey Dresser, MD  flecainide (TAMBOCOR) 50 MG tablet Take 1 tablet (50 mg total) by mouth 2 (two) times daily. 06/27/20  Yes Deboraha Sprang, MD  furosemide (LASIX) 20 MG tablet TAKE 1 TABLET (20 MG TOTAL) BY MOUTH AS NEEDED FOR FLUID (NO MORE THAN TWICE PER WEEK). Patient taking differently: Take 20 mg by mouth daily as needed for fluid. TAKE 1 TABLET (20 MG TOTAL)  BY MOUTH AS NEEDED FOR FLUID (NO MORE THAN TWICE PER WEEK). 01/03/20  Yes Larey Dresser, MD  levothyroxine (SYNTHROID, LEVOTHROID) 50 MCG tablet Take 50 mcg by mouth daily.   Yes [provider]  Magnesium 250 MG TABS Take 250 mg by mouth daily.   Yes [provider]  multivitamin [facility]) per tablet Take 1 tablet by mouth daily.   Yes [provider]  pantoprazole (PROTONIX) 40 MG tablet Take 40 mg by mouth daily.   Yes [provider]  Polyethyl Glycol-Propyl Glycol (SYSTANE OP) Place 1 drop into both eyes daily as needed (dry eyes).   Yes [provider]  sacubitril-valsartan (ENTRESTO) 24-26 MG Take 1 tablet by mouth 2 (two) times daily. 12/13/18  Yes Larey Dresser, MD  spironolactone (ALDACTONE) 25 MG tablet Take 1 tablet (25 mg total) by mouth daily. 09/05/18  Yes Larey Dresser, MD  vitamin B-12 (CYANOCOBALAMIN) 1000 MCG tablet Take 1,000 mcg by mouth daily.   Yes [provider]    Physical Exam: Vitals:   01/16/21 1645 01/16/21 1649 01/16/21 1700 01/16/21 1715  BP:  (!) 104/56  (!) 105/51   Pulse: 83 84 83 84  Resp: (!) 24 (!) 24 (!) 21 (!) 21  Temp:      TempSrc:      SpO2: 93% 95% 96% 93%    Vitals:   01/16/21 1645 01/16/21 1649 01/16/21 1700 01/16/21 1715  BP:  (!) 104/56 (!) 105/51   Pulse: 83 84 83 84  Resp: (!) 24 (!) 24 (!) 21 (!) 21  Temp:      TempSrc:      SpO2: 93% 95% 96% 93%   General exam: Awake, laying in bed, in nad Respiratory system: Normal respiratory effort, no audible wheezing Cardiovascular system: Perfused, no LE edema Gastrointestinal system: Soft, nondistended Central nervous system: CN2-12 grossly intact, no tremors Extremities: Perfused, no clubbing Skin: Normal skin turgor, no notable skin lesions seen Psychiatry: Mood normal // no visual hallucinations    Labs on Admission: I have personally reviewed following labs and imaging studies  CBC: Recent Labs  Lab 01/16/21 1208  WBC 5.6  HGB 12.4  HCT 37.0  MCV 93.4  PLT 774*   Basic Metabolic Panel: Recent Labs  Lab 01/16/21 1208  NA 134*  K 4.3  CL 103  CO2 23  GLUCOSE 201*  BUN 24*  CREATININE 1.53*  CALCIUM 9.0   GFR: CrCl cannot be calculated (Unknown ideal weight.). Liver Function Tests: No results for input(s): AST, ALT, ALKPHOS, BILITOT, PROT, ALBUMIN in the last 168 hours. No results for input(s): LIPASE, AMYLASE in the last 168 hours. No results for input(s): AMMONIA in the last 168 hours. Coagulation Profile: No results for input(s): INR, PROTIME in the last 168 hours. Cardiac Enzymes: No results for input(s): CKTOTAL, CKMB, CKMBINDEX, TROPONINI in the last 168 hours. BNP (last 3 results) No results for input(s): PROBNP in the last 8760 hours. HbA1C: No results for input(s): HGBA1C in the last 72 hours. CBG: No results for input(s): GLUCAP in the last 168 hours. Lipid Profile: No results for input(s): CHOL, HDL, LDLCALC, TRIG, CHOLHDL, LDLDIRECT in the last 72 hours. Thyroid Function Tests: No results for input(s): TSH, T4TOTAL, FREET4,  T3FREE, THYROIDAB in the last 72 hours. Anemia Panel: No results for input(s): VITAMINB12, FOLATE, FERRITIN, TIBC, IRON, RETICCTPCT in the last 72 hours. Urine analysis:    Component Value Date/Time   COLORURINE YELLOW 01/16/2021 1335  APPEARANCEUR HAZY (A) 01/16/2021 1335   LABSPEC >=1.030 01/16/2021 1335   PHURINE 5.0 01/16/2021 1335   GLUCOSEU NEGATIVE 01/16/2021 1335   HGBUR NEGATIVE 01/16/2021 1335   BILIRUBINUR NEGATIVE 01/16/2021 1335   KETONESUR TRACE (A) 01/16/2021 1335   PROTEINUR TRACE (A) 01/16/2021 1335   UROBILINOGEN 0.2 01/30/2014 1511   NITRITE NEGATIVE 01/16/2021 1335   LEUKOCYTESUR NEGATIVE 01/16/2021 1335   Sepsis Labs: !!!!!!!!!!!!!!!!!!!!!!!!!!!!!!!!!!!!!!!!!!!! @LABRCNTIP (procalcitonin:4,lacticidven:4) ) Recent Results (from the past 240 hour(s))  Resp Panel by RT-PCR (Flu A&B, Covid) Nasopharyngeal Swab     Status: Abnormal   Collection Time: 01/16/21  1:38 PM   Specimen: Nasopharyngeal Swab; Nasopharyngeal(NP) swabs in vial transport medium  Result Value Ref Range Status   SARS Coronavirus 2 by RT PCR POSITIVE (A) NEGATIVE Final    Comment: (NOTE) SARS-CoV-2 target nucleic acids are DETECTED.  The SARS-CoV-2 RNA is generally detectable in upper respiratory specimens during the acute phase of infection. Positive results are indicative of the presence of the identified virus, but do not rule out bacterial infection or co-infection with other pathogens not detected by the test. Clinical correlation with patient history and other diagnostic information is necessary to determine patient infection status. The expected result is Negative.  Fact Sheet for Patients: EntrepreneurPulse.com.au  Fact Sheet for Healthcare Providers: IncredibleEmployment.be  This test is not yet approved or cleared by the [place] FDA and  has been authorized for detection and/or diagnosis of SARS-CoV-2 by FDA under an Emergency Use  Authorization (EUA).  This EUA will remain in effect (meaning this test can be used) for the duration of  the COVID-19 declaration under Section 564(b)(1) of the A ct, 21 U.S.C. section 360bbb-3(b)(1), unless the authorization is terminated or revoked sooner.     Influenza A by PCR NEGATIVE NEGATIVE Final   Influenza B by PCR NEGATIVE NEGATIVE Final    Comment: (NOTE) The Xpert Xpress SARS-CoV-2/FLU/RSV plus assay is intended as an aid in the diagnosis of influenza from Nasopharyngeal swab specimens and should not be used as a sole basis for treatment. Nasal washings and aspirates are unacceptable for Xpert Xpress SARS-CoV-2/FLU/RSV testing.  Fact Sheet for Patients: EntrepreneurPulse.com.au  Fact Sheet for Healthcare Providers: IncredibleEmployment.be  This test is not yet approved or cleared by the [place] FDA and has been authorized for detection and/or diagnosis of SARS-CoV-2 by FDA under an Emergency Use Authorization (EUA). This EUA will remain in effect (meaning this test can be used) for the duration of the COVID-19 declaration under Section 564(b)(1) of the Act, 21 U.S.C. section 360bbb-3(b)(1), unless the authorization is terminated or revoked.  Performed at [facility], [place] [address], [place], [place] [address]      Radiological Exams on Admission: DG Chest 2 View  Result Date: 01/16/2021 CLINICAL DATA:  87 year old female with history of shortness of breath. Near syncopal episode this morning. EXAM: CHEST - 2 VIEW COMPARISON:  Chest x-ray 07/15/2015. FINDINGS: Lung volumes are low. Eventration of the left hemidiaphragm. No consolidative airspace disease. No pleural effusions. No pneumothorax. No pulmonary nodule or mass noted. Pulmonary vasculature and the cardiomediastinal silhouette are within normal limits. Atherosclerotic calcifications are noted in the thoracic aorta. Left-sided biventricular  pacemaker/AICD noted with lead tips projecting over the expected location of the right atrium, right ventricle and left ventricle via the coronary sinus and coronary veins. IMPRESSION: 1. Low lung volumes without radiographic evidence of acute cardiopulmonary disease. 2. Aortic atherosclerosis. Electronically Signed   By: Mauri Brooklyn.D.  On: 01/16/2021 12:21   CT Head Wo Contrast  Result Date: 01/16/2021 CLINICAL DATA:  Altered mental status near syncope EXAM: CT HEAD WITHOUT CONTRAST TECHNIQUE: Contiguous axial images were obtained from the base of the skull through the vertex without intravenous contrast. COMPARISON:  04/22/2005 FINDINGS: Brain: No acute intracranial findings are seen. Ventricles are not dilated. There is no shift of midline structures. Cortical sulci are prominent. There is subcentimeter low-density in the mesial left temporal lobe and basal ganglia with no significant interval change. This may suggest parahippocampal cyst or old lacunar infarct. Vascular: Unremarkable. Skull: Unremarkable. Sinuses/Orbits: Unremarkable. Other: No significant interval changes are noted. IMPRESSION: No acute intracranial findings are seen in noncontrast CT brain. Atrophy. Electronically Signed   By: Elmer Picker M.D.   On: 01/16/2021 12:41    EKG: Independently reviewed. Paced  Assessment/Plan Principal Problem:   Syncope Active Problems:   HTN (hypertension)   Hyperlipemia   Chronic systolic heart failure (HCC)   Implantable cardioverter-defibrillator (ICD) in situ   Depression   Chronic diastolic congestive heart failure (HCC)   ARF (acute renal failure) (HCC)   Dehydration   COVID-19 virus infection   Syncope Not orthostatic in ED per BP, however pt is clinically dehydrated with increased thirst, decreased PO intake while on diuretics.  Head CT reviewed, neg Suspect syncope related to dehydration in the setting of COVID and diuretics Will give gentle IVF overnight and  repeat bmet in AM Consult PT Chronic systolic CHF Clinically dehydrated per above Holding diuretics for now and giving gentle IVF, being cautious to avoid overhydration Cont to monitor on tele HTN BP soft in ED, improved with IVF given Cont beta blocker for now Hold diuretics and hold entresto given soft BP and below ARF Would resume home meds as able HLD Would resume home lipid meds ARF Suspect secondary to presenting dehydration Holding diuretics and entresto Cont on gentle IVF overnight Repeat bmet in AM Dehydration Will give course of IVF per above COVID Incidentally found this visit Pt is vaccinated x3 Given risk factors, have ordered Paxlovid Patient currently without respiratory symptoms and is not hypoxemic. Will follow inflammatory markers  DVT prophylaxis: Lovenox subq  Code Status: Full Family Communication: Pt in room, family at bedside  Disposition Plan: Uncertain at this time  Consults called:  Admission status: Observation as it would likely require less than 2 midnight stay to rehydrate pt with IVF    Marylu Lund MD [facility] Pager On Amion  If 7PM-7AM, please contact night-coverage  01/16/2021, 5:55 PM

## 2021-01-16 NOTE — ED Triage Notes (Signed)
Patient reports near syncopal episode this morning after waking up and getting out of bed. Denies falling/hitting head. She reports chest pain started shortly after which has not resolved. Also endorses blurry vision. Hx CHF, HTN, LBBB w/ pacemaker

## 2021-01-16 NOTE — ED Provider Notes (Signed)
[facility] DEPT Provider Note   CSN: [id number] Arrival date & time: 01/16/21  1146     History Chief Complaint  Patient presents with   Shortness of Breath   Near Syncope    Heather Smith is a 87 y.o. female.  HPI  87 year old female with past medical history of nonischemic cardiomyopathy with CHF and pacemaker/defibrillator in place, HTN, HLD presents to the emergency department with concern for fatigue, palpitations and near syncope earlier this morning.  Patient felt in her baseline status yesterday when she went to bed.  Woke up this morning feeling weak and fatigued.  States that when she stood up she felt like she was going to pass out.  She states seated/laying down and felt okay but anytime she went to sit up she felt "woozy".  Daughter is at bedside and says that she seems often slow to respond.  No known fever, productive cough, swelling of his lower extremities, GI symptoms.  Past Medical History:  Diagnosis Date   Chest pain    a. Nl cath in the 90's;  b. 04/2005: Myoview: subtle areao of ? reversibility in apical segment of anteromedial LV - ? attenuation, EF 54%;  c. 05/2012 MV: small, fixed mild apical septal perfusion defect, ? attenuation due to lbbb, no ischemia.   CHF (congestive heart failure) (HCC)    Complication of anesthesia    difficult to wake up and blood pressure drops   GERD (gastroesophageal reflux disease)    Hypercholesterolemia    Hypertension    Hypothyroidism    Left bundle branch block    LV lead partial dislodgment 05/30/2013   Neurogenic claudication due to lumbar spinal stenosis    Non-ischemic cardiomyopathy ([place])    LHC 10/04/12: Normal coronary arteries, EF 25-30% with global HK.  Echocardiogram 10/05/12: EF 30-35%, diffuse HK, moderate LAE. Echocardiogram (01/2013): Lateral to septal wall dyssynchrony EF 20-25%, diffuse HK   Osteoarthritis    "about all over" (02/22/2013)   Spinal stenosis    Vitamin D  deficiency     Patient Active Problem List   Diagnosis Date Noted   Paroxysmal atrial fibrillation ([place]) 09/17/2016   Cardiac arrhythmia 09/26/2015   Dizziness 09/26/2015   Presbycusis of both ears 09/26/2015   Subjective tinnitus of both ears 09/26/2015   Pre-syncope 07/17/2015   Chronic diastolic congestive heart failure (HCC)    Primary osteoarthritis of left knee 07/13/2015   Abdominal pain 03/05/2015   Depression 06/06/2013   Implantable cardioverter-defibrillator (ICD) in situ 05/30/2013   LV lead partial dislodgment 05/30/2013   Nonischemic cardiomyopathy ([place]) 02/08/2013   PVC (premature ventricular contraction) 02/08/2013   LBBB (left bundle branch block) 02/08/2013   Sinus bradycardia 24/40/1027   Chronic systolic heart failure ([place]) 12/15/2012   Precordial pain 10/05/2012   HTN (hypertension) 08/22/2010   Hyperlipemia 08/22/2010    Past Surgical History:  Procedure Laterality Date   APPENDECTOMY     BI-VENTRICULAR IMPLANTABLE CARDIOVERTER DEFIBRILLATOR N/A 02/22/2013   Procedure: BI-VENTRICULAR IMPLANTABLE CARDIOVERTER DEFIBRILLATOR  (CRT-D);  Surgeon: Deboraha Sprang, MD;  Location: [facility] CATH LAB;  Service: Cardiovascular;  Laterality: N/A;   BIV ICD GENERATOR CHANGEOUT N/A 08/19/2018   Procedure: BIV ICD GENERATOR CHANGEOUT;  Surgeon: Deboraha Sprang, MD;  Location: [facility] CV LAB;  Service: Cardiovascular;  Laterality: N/A;   BLEPHAROPLASTY Bilateral    EXCISIONAL HEMORRHOIDECTOMY     FOOT ARTHRODESIS, MODIFIED MCBRIDE Bilateral    KNEE ARTHROSCOPY Right    LEFT HEART  CATHETERIZATION WITH CORONARY ANGIOGRAM N/A 10/04/2012   Procedure: LEFT HEART CATHETERIZATION WITH CORONARY ANGIOGRAM;  Surgeon: Peter M Martinique, MD;  Location: [facility] CATH LAB;  Service: Cardiovascular;  Laterality: N/A;   LUMBAR LAMINECTOMY  11/03/2006    Bilateral 3, 4, 5 laminectomy, partial L2, decompression of the thecal sac, foraminotomy, posterolateral arthrodesis L3-S1 with autograft   --  SURGEON:  Leeroy Cha, M.D.    TONSILLECTOMY     TOTAL KNEE ARTHROPLASTY Left 07/15/2015   Procedure: LEFT TOTAL KNEE ARTHROPLASTY;  Surgeon: Frederik Pear, MD;  Location: [facility];  Service: Orthopedics;  Laterality: Left;   TUBAL LIGATION     VAGINAL HYSTERECTOMY       OB History   No obstetric history on file.     Family History  Problem Relation Age of Onset   Aneurysm Father        died @ 82.   Heart attack Mother        died @ 18.   Thyroid disease Mother    Lung cancer Mother    Hyperlipidemia Mother    Aneurysm Brother        has had an Aneurysm x3   Colon cancer Brother    Bone cancer Brother    Lung cancer Brother    Stomach cancer Brother    Neuropathy Neg Hx     Social History   Tobacco Use   Smoking status: Never   Smokeless tobacco: Never  Vaping Use   Vaping Use: Never used  Substance Use Topics   Alcohol use: No   Drug use: No    Home Medications Prior to Admission medications   Medication Sig Start Date End Date Taking? Authorizing Provider  acetaminophen (TYLENOL) 325 MG tablet Take 325 mg by mouth every 6 (six) hours as needed for moderate pain or headache.   Yes [provider]  Ascorbic Acid (VITAMIN C) 1000 MG tablet Take 1,000 mg by mouth daily.   Yes [provider]  carvedilol (COREG) 6.25 MG tablet TAKE ONE TABLET BY MOUTH AT BREAKFAST AND AT BEDTIME Patient taking differently: Take 6.25 mg by mouth 2 (two) times daily with a meal. 09/19/20  Yes Larey Dresser, MD  diphenhydramine-acetaminophen (TYLENOL PM) 25-500 MG TABS tablet Take 1 tablet by mouth at bedtime as needed (sleep).   Yes [provider]  DULoxetine (CYMBALTA) 30 MG capsule Take 30 mg by mouth daily.  05/21/16  Yes [provider]  ezetimibe (ZETIA) 10 MG tablet Take 1 tablet (10 mg total) by mouth daily. 02/12/20 11/07/21 Yes Larey Dresser, MD  flecainide (TAMBOCOR) 50 MG tablet Take 1 tablet (50 mg total) by mouth 2 (two) times daily.  06/27/20  Yes Deboraha Sprang, MD  furosemide (LASIX) 20 MG tablet TAKE 1 TABLET (20 MG TOTAL) BY MOUTH AS NEEDED FOR FLUID (NO MORE THAN TWICE PER WEEK). Patient taking differently: Take 20 mg by mouth daily as needed for fluid. TAKE 1 TABLET (20 MG TOTAL) BY MOUTH AS NEEDED FOR FLUID (NO MORE THAN TWICE PER WEEK). 01/03/20  Yes Larey Dresser, MD  levothyroxine (SYNTHROID, LEVOTHROID) 50 MCG tablet Take 50 mcg by mouth daily.   Yes [provider]  Magnesium 250 MG TABS Take 250 mg by mouth daily.   Yes [provider]  multivitamin [facility]) per tablet Take 1 tablet by mouth daily.   Yes [provider]  pantoprazole (PROTONIX) 40 MG tablet Take 40 mg by mouth daily.  Yes [provider]  Polyethyl Glycol-Propyl Glycol (SYSTANE OP) Place 1 drop into both eyes daily as needed (dry eyes).   Yes [provider]  sacubitril-valsartan (ENTRESTO) 24-26 MG Take 1 tablet by mouth 2 (two) times daily. 12/13/18  Yes Larey Dresser, MD  spironolactone (ALDACTONE) 25 MG tablet Take 1 tablet (25 mg total) by mouth daily. 09/05/18  Yes Larey Dresser, MD  vitamin B-12 (CYANOCOBALAMIN) 1000 MCG tablet Take 1,000 mcg by mouth daily.   Yes [provider]    Allergies    Statins, Niaspan [niacin er], Simvastatin, Benadryl [diphenhydramine], and Dilaudid [hydromorphone hcl]  Review of Systems   Review of Systems  Constitutional:  Positive for appetite change and fatigue. Negative for chills and fever.  HENT:  Negative for congestion.   Eyes:  Negative for visual disturbance.  Respiratory:  Negative for shortness of breath.   Cardiovascular:  Positive for chest pain and palpitations. Negative for leg swelling.  Gastrointestinal:  Negative for abdominal pain, diarrhea, nausea and vomiting.  Genitourinary:  Negative for dysuria.  Skin:  Negative for rash.  Neurological:  Negative for headaches.   Physical Exam Updated Vital Signs BP (!) 104/56  (BP Location: Left Arm)    Pulse 84    Temp (!) 102.9 F (39.4 C) (Oral)    Resp (!) 24    SpO2 95%   Physical Exam Vitals and nursing note reviewed.  Constitutional:      Appearance: Normal appearance. She is ill-appearing.  HENT:     Head: Normocephalic.     Mouth/Throat:     Mouth: Mucous membranes are moist.  Cardiovascular:     Rate and Rhythm: Normal rate.     Comments: Hypotensive Pulmonary:     Effort: Pulmonary effort is normal. No respiratory distress.  Abdominal:     Palpations: Abdomen is soft.     Tenderness: There is no abdominal tenderness.  Musculoskeletal:     Right lower leg: No edema.     Left lower leg: No edema.  Skin:    General: Skin is warm.  Neurological:     Mental Status: She is alert and oriented to person, place, and time. Mental status is at baseline.  Psychiatric:        Mood and Affect: Mood normal.    ED Results / Procedures / Treatments   Labs (all labs ordered are listed, but only abnormal results are displayed) Labs Reviewed  RESP PANEL BY RT-PCR (FLU A&B, COVID) ARPGX2 - Abnormal; Notable for the following components:      Result Value   SARS Coronavirus 2 by RT PCR POSITIVE (*)    All other components within normal limits  BASIC METABOLIC PANEL - Abnormal; Notable for the following components:   Sodium 134 (*)    Glucose, Bld 201 (*)    BUN 24 (*)    Creatinine, Ser 1.53 (*)    GFR, Estimated 33 (*)    All other components within normal limits  CBC - Abnormal; Notable for the following components:   Platelets 140 (*)    All other components within normal limits  URINALYSIS, ROUTINE W REFLEX MICROSCOPIC - Abnormal; Notable for the following components:   APPearance HAZY (*)    Ketones, ur TRACE (*)    Protein, ur TRACE (*)    Bacteria, UA RARE (*)    All other components within normal limits  BLOOD GAS, VENOUS - Abnormal; Notable for the following components:   pO2,  Ven <32.0 (*)    All other components within normal limits   BRAIN NATRIURETIC PEPTIDE  TROPONIN I (HIGH SENSITIVITY)  TROPONIN I (HIGH SENSITIVITY)    EKG EKG Interpretation  Date/Time:  Thursday January 16 2021 11:59:33 EST Ventricular Rate:  80 PR Interval:  122 QRS Duration: 172 QT Interval:  438 QTC Calculation: 506 R Axis:   -78 Text Interpretation: Sinus rhythm IVCD, consider atypical RBBB Left ventricular hypertrophy Paced rhythm Confirmed by Lavenia Atlas [phone]) on 01/16/2021 12:58:01 PM  Radiology DG Chest 2 View  Result Date: 01/16/2021 CLINICAL DATA:  87 year old female with history of shortness of breath. Near syncopal episode this morning. EXAM: CHEST - 2 VIEW COMPARISON:  Chest x-ray 07/15/2015. FINDINGS: Lung volumes are low. Eventration of the left hemidiaphragm. No consolidative airspace disease. No pleural effusions. No pneumothorax. No pulmonary nodule or mass noted. Pulmonary vasculature and the cardiomediastinal silhouette are within normal limits. Atherosclerotic calcifications are noted in the thoracic aorta. Left-sided biventricular pacemaker/AICD noted with lead tips projecting over the expected location of the right atrium, right ventricle and left ventricle via the coronary sinus and coronary veins. IMPRESSION: 1. Low lung volumes without radiographic evidence of acute cardiopulmonary disease. 2. Aortic atherosclerosis. Electronically Signed   By: Vinnie Langton M.D.   On: 01/16/2021 12:21   CT Head Wo Contrast  Result Date: 01/16/2021 CLINICAL DATA:  Altered mental status near syncope EXAM: CT HEAD WITHOUT CONTRAST TECHNIQUE: Contiguous axial images were obtained from the base of the skull through the vertex without intravenous contrast. COMPARISON:  04/22/2005 FINDINGS: Brain: No acute intracranial findings are seen. Ventricles are not dilated. There is no shift of midline structures. Cortical sulci are prominent. There is subcentimeter low-density in the mesial left temporal lobe and basal ganglia with no  significant interval change. This may suggest parahippocampal cyst or old lacunar infarct. Vascular: Unremarkable. Skull: Unremarkable. Sinuses/Orbits: Unremarkable. Other: No significant interval changes are noted. IMPRESSION: No acute intracranial findings are seen in noncontrast CT brain. Atrophy. Electronically Signed   By: Elmer Picker M.D.   On: 01/16/2021 12:41    Procedures .Critical Care Performed by: Lorelle Gibbs, DO Authorized by: Lorelle Gibbs, DO   Critical care provider statement:    Critical care time (minutes):  30   Critical care was time spent personally by me on the following activities:  Development of treatment plan with patient or surrogate, discussions with consultants, evaluation of patient's response to treatment, examination of patient, ordering and review of laboratory studies, ordering and review of radiographic studies, ordering and performing treatments and interventions, pulse oximetry, re-evaluation of patient's condition and review of old charts   I assumed direction of critical care for this patient from another provider in my specialty: no     Care discussed with: admitting provider     Medications Ordered in ED Medications  sodium chloride 0.9 % bolus 1,000 mL (0 mLs Intravenous Stopped 01/16/21 1526)  acetaminophen (TYLENOL) tablet 1,000 mg (1,000 mg Oral Given 01/16/21 1559)    ED Course  I have reviewed the triage vital signs and the nursing notes.  Pertinent labs & imaging results that were available during my care of the patient were reviewed by me and considered in my medical decision making (see chart for details).    MDM Rules/Calculators/A&P                         87 year old female presents the emergency department with fatigue,  chest pain/palpitations and an episode of near syncope today.  She is hypotensive on arrival, appears fatigued and unwell but nontoxic.  Maintaining oxygen saturation on room air.  EKG shows a paced  rhythm.  Blood work shows a mild AKI but otherwise is reassuring.  Cardiac enzymes are negative.  Chest x-ray shows no findings of heart failure.  Interrogation of pacemaker shows episodes of AT/AF this morning that were paced but no ventricular rhythms, no shocking of the device.  Patient is hypotension has been fluid responsive.  She is COVID-positive most likely attributing for all of her symptoms.  Patient follows with Dr.  Aundra Dubin for heart failure. Spoke with on-call heart failure physician Dr. Sung Amabile.  He agrees that the patient can stay here from a medical admission standpoint.  No acute findings of CHF or concern for pacemaker malfunction.  Patient will be admitted to the medical team.  She developed a fever of 102.9, was given Tylenol.  Patients evaluation and results requires admission for further treatment and care. Patient agrees with admission plan, offers no new complaints and is stable/unchanged at time of admit.     Final Clinical Impression(s) / ED Diagnoses Final diagnoses:  COVID-19  Hypotension, unspecified hypotension type    Rx / DC Orders ED Discharge Orders     None        Lorelle Gibbs, DO 01/16/21 1707

## 2021-01-16 NOTE — ED Notes (Signed)
I informed patient's visitor she needed to remain in the room due to patient's covid restrictions-patient is on the phone and not willing to acknowledge my request.  Informed family member she needed to remain in room or leave-explained to family member that room is in close proximity to radiology and that other patient's/staff could be exposed if she does not comply to rules

## 2021-01-16 NOTE — ED Provider Notes (Signed)
Emergency Medicine Provider Triage Evaluation Note  Heather Smith , a 87 y.o. female  was evaluated in triage.  Pt complains of SOB, chest pain, and near syncope today around 0600. The patient reports she doesn't feel well like she did when she got her pacemaker. Daughter is at bedside and says that the patient is "off" and slow to respond. Last known well 2300.  Review of Systems  Positive: Sob,  near syncope, AMS Negative: Abdominal pain, nausea, vomiting  Physical Exam  BP (!) 84/47 (BP Location: Right Arm)    Pulse 77    Temp 98.6 F (37 C) (Oral)    Resp 18    SpO2 95%  Gen:   Awake, no distress , slow to respond Resp:  Normal effort  MSK:   Moves extremities without difficulty  Other:  No pronator drift. Strength equal in upper and lower extremities  Medical Decision Making  Medically screening exam initiated at 12:06 PM.  Appropriate orders placed.  Heather Smith was informed that the remainder of the evaluation will be completed by another provider, this initial triage assessment does not replace that evaluation, and the importance of remaining in the ED until their evaluation is complete.  Labs and imaging ordered. Nursing aware that patient needs to be roomed now.    Sherrell Puller, PA-C 01/16/21 1209    Valarie Merino, MD 01/16/21 [phone]

## 2021-01-17 ENCOUNTER — Other Ambulatory Visit (HOSPITAL_COMMUNITY): Payer: Self-pay

## 2021-01-17 ENCOUNTER — Observation Stay (HOSPITAL_COMMUNITY): Payer: Medicare HMO

## 2021-01-17 ENCOUNTER — Encounter (HOSPITAL_COMMUNITY): Payer: Self-pay | Admitting: Internal Medicine

## 2021-01-17 DIAGNOSIS — F419 Anxiety disorder, unspecified: Secondary | ICD-10-CM | POA: Diagnosis not present

## 2021-01-17 DIAGNOSIS — R55 Syncope and collapse: Secondary | ICD-10-CM | POA: Diagnosis not present

## 2021-01-17 DIAGNOSIS — Z79899 Other long term (current) drug therapy: Secondary | ICD-10-CM | POA: Diagnosis not present

## 2021-01-17 DIAGNOSIS — K219 Gastro-esophageal reflux disease without esophagitis: Secondary | ICD-10-CM | POA: Diagnosis not present

## 2021-01-17 DIAGNOSIS — I5032 Chronic diastolic (congestive) heart failure: Secondary | ICD-10-CM | POA: Diagnosis not present

## 2021-01-17 DIAGNOSIS — E559 Vitamin D deficiency, unspecified: Secondary | ICD-10-CM | POA: Diagnosis not present

## 2021-01-17 DIAGNOSIS — I4892 Unspecified atrial flutter: Secondary | ICD-10-CM | POA: Diagnosis not present

## 2021-01-17 DIAGNOSIS — E7800 Pure hypercholesterolemia, unspecified: Secondary | ICD-10-CM | POA: Diagnosis not present

## 2021-01-17 DIAGNOSIS — Z8249 Family history of ischemic heart disease and other diseases of the circulatory system: Secondary | ICD-10-CM | POA: Diagnosis not present

## 2021-01-17 DIAGNOSIS — E860 Dehydration: Secondary | ICD-10-CM | POA: Diagnosis not present

## 2021-01-17 DIAGNOSIS — I2510 Atherosclerotic heart disease of native coronary artery without angina pectoris: Secondary | ICD-10-CM | POA: Diagnosis not present

## 2021-01-17 DIAGNOSIS — I480 Paroxysmal atrial fibrillation: Secondary | ICD-10-CM | POA: Diagnosis not present

## 2021-01-17 DIAGNOSIS — Z8349 Family history of other endocrine, nutritional and metabolic diseases: Secondary | ICD-10-CM | POA: Diagnosis not present

## 2021-01-17 DIAGNOSIS — N1832 Chronic kidney disease, stage 3b: Secondary | ICD-10-CM | POA: Diagnosis not present

## 2021-01-17 DIAGNOSIS — N179 Acute kidney failure, unspecified: Secondary | ICD-10-CM | POA: Diagnosis not present

## 2021-01-17 DIAGNOSIS — M961 Postlaminectomy syndrome, not elsewhere classified: Secondary | ICD-10-CM | POA: Diagnosis not present

## 2021-01-17 DIAGNOSIS — Z83438 Family history of other disorder of lipoprotein metabolism and other lipidemia: Secondary | ICD-10-CM | POA: Diagnosis not present

## 2021-01-17 DIAGNOSIS — I130 Hypertensive heart and chronic kidney disease with heart failure and stage 1 through stage 4 chronic kidney disease, or unspecified chronic kidney disease: Secondary | ICD-10-CM | POA: Diagnosis not present

## 2021-01-17 DIAGNOSIS — I5042 Chronic combined systolic (congestive) and diastolic (congestive) heart failure: Secondary | ICD-10-CM | POA: Diagnosis not present

## 2021-01-17 DIAGNOSIS — I951 Orthostatic hypotension: Secondary | ICD-10-CM | POA: Diagnosis not present

## 2021-01-17 DIAGNOSIS — F32A Depression, unspecified: Secondary | ICD-10-CM | POA: Diagnosis not present

## 2021-01-17 DIAGNOSIS — D6959 Other secondary thrombocytopenia: Secondary | ICD-10-CM | POA: Diagnosis not present

## 2021-01-17 DIAGNOSIS — E1122 Type 2 diabetes mellitus with diabetic chronic kidney disease: Secondary | ICD-10-CM | POA: Diagnosis not present

## 2021-01-17 DIAGNOSIS — E039 Hypothyroidism, unspecified: Secondary | ICD-10-CM | POA: Diagnosis not present

## 2021-01-17 DIAGNOSIS — Z79890 Hormone replacement therapy (postmenopausal): Secondary | ICD-10-CM | POA: Diagnosis not present

## 2021-01-17 DIAGNOSIS — Z95810 Presence of automatic (implantable) cardiac defibrillator: Secondary | ICD-10-CM | POA: Diagnosis not present

## 2021-01-17 DIAGNOSIS — Z96652 Presence of left artificial knee joint: Secondary | ICD-10-CM | POA: Diagnosis not present

## 2021-01-17 DIAGNOSIS — I471 Supraventricular tachycardia: Secondary | ICD-10-CM | POA: Diagnosis not present

## 2021-01-17 DIAGNOSIS — U071 COVID-19: Secondary | ICD-10-CM | POA: Diagnosis not present

## 2021-01-17 LAB — ECHOCARDIOGRAM LIMITED
AR max vel: 2.61 cm2
AV Peak grad: 6.8 mmHg
Ao pk vel: 1.3 m/s
Area-P 1/2: 3.87 cm2
Calc EF: 56 %
Height: 67 in
S' Lateral: 3.3 cm
Single Plane A2C EF: 53.7 %
Single Plane A4C EF: 57 %
Weight: 3038.4 oz

## 2021-01-17 LAB — CBC
HCT: 37.3 % (ref 36.0–46.0)
Hemoglobin: 12.3 g/dL (ref 12.0–15.0)
MCH: 31.3 pg (ref 26.0–34.0)
MCHC: 33 g/dL (ref 30.0–36.0)
MCV: 94.9 fL (ref 80.0–100.0)
Platelets: 112 10*3/uL — ABNORMAL LOW (ref 150–400)
RBC: 3.93 MIL/uL (ref 3.87–5.11)
RDW: 13.4 % (ref 11.5–15.5)
WBC: 4.9 10*3/uL (ref 4.0–10.5)
nRBC: 0 % (ref 0.0–0.2)

## 2021-01-17 LAB — COMPREHENSIVE METABOLIC PANEL
ALT: 14 U/L (ref 0–44)
AST: 20 U/L (ref 15–41)
Albumin: 3.9 g/dL (ref 3.5–5.0)
Alkaline Phosphatase: 49 U/L (ref 38–126)
Anion gap: 10 (ref 5–15)
BUN: 24 mg/dL — ABNORMAL HIGH (ref 8–23)
CO2: 24 mmol/L (ref 22–32)
Calcium: 8.4 mg/dL — ABNORMAL LOW (ref 8.9–10.3)
Chloride: 105 mmol/L (ref 98–111)
Creatinine, Ser: 1.29 mg/dL — ABNORMAL HIGH (ref 0.44–1.00)
GFR, Estimated: 40 mL/min — ABNORMAL LOW (ref >60)
Glucose, Bld: 115 mg/dL — ABNORMAL HIGH (ref 70–99)
Potassium: 4.4 mmol/L (ref 3.5–5.1)
Sodium: 139 mmol/L (ref 135–145)
Total Bilirubin: 0.7 mg/dL (ref 0.3–1.2)
Total Protein: 6.6 g/dL (ref 6.5–8.1)

## 2021-01-17 LAB — MAGNESIUM: Magnesium: 1.8 mg/dL (ref 1.7–2.4)

## 2021-01-17 LAB — C-REACTIVE PROTEIN
CRP: 3.8 mg/dL — ABNORMAL HIGH (ref <1.0)
CRP: 4.4 mg/dL — ABNORMAL HIGH (ref <1.0)

## 2021-01-17 LAB — FERRITIN: Ferritin: 72 ng/mL (ref 11–307)

## 2021-01-17 LAB — TSH: TSH: 0.829 u[IU]/mL (ref 0.350–4.500)

## 2021-01-17 LAB — D-DIMER, QUANTITATIVE: D-Dimer, Quant: 3.4 ug/mL-FEU — ABNORMAL HIGH (ref 0.00–0.50)

## 2021-01-17 MED ORDER — ZINC SULFATE 220 (50 ZN) MG PO CAPS
220.0000 mg | ORAL_CAPSULE | Freq: Every day | ORAL | Status: DC
  Administered 2021-01-17 – 2021-01-20 (×4): 220 mg via ORAL
  Filled 2021-01-17 (×4): qty 1

## 2021-01-17 MED ORDER — ZINC SULFATE 220 (50 ZN) MG PO CAPS
220.0000 mg | ORAL_CAPSULE | Freq: Every day | ORAL | 0 refills | Status: AC
  Filled 2021-01-17: qty 30, 30d supply, fill #0

## 2021-01-17 MED ORDER — ALBUTEROL SULFATE HFA 108 (90 BASE) MCG/ACT IN AERS
2.0000 | INHALATION_SPRAY | RESPIRATORY_TRACT | 0 refills | Status: DC | PRN
  Filled 2021-01-17: qty 8.5, 16d supply, fill #0

## 2021-01-17 MED ORDER — CARVEDILOL 3.125 MG PO TABS
3.1250 mg | ORAL_TABLET | Freq: Two times a day (BID) | ORAL | 0 refills | Status: DC
  Filled 2021-01-17: qty 60, 30d supply, fill #0

## 2021-01-17 MED ORDER — CARVEDILOL 3.125 MG PO TABS: 3.1250 mg | ORAL_TABLET | Freq: Two times a day (BID) | ORAL | Status: DC

## 2021-01-17 MED ORDER — MOLNUPIRAVIR EUA 200MG CAPSULE
5.0000 | ORAL_CAPSULE | Freq: Two times a day (BID) | ORAL | 0 refills | Status: AC
Start: 2021-01-17 — End: 2021-01-22
  Filled 2021-01-17: qty 40, 5d supply, fill #0

## 2021-01-17 MED ORDER — VITAMIN D3 25 MCG PO TABS
1000.0000 [IU] | ORAL_TABLET | Freq: Every day | ORAL | 0 refills | Status: AC
  Filled 2021-01-17: qty 30, 30d supply, fill #0

## 2021-01-17 MED ORDER — VITAMIN D 25 MCG (1000 UNIT) PO TABS
1000.0000 [IU] | ORAL_TABLET | Freq: Every day | ORAL | Status: DC
  Administered 2021-01-17 – 2021-01-20 (×4): 1000 [IU] via ORAL
  Filled 2021-01-17 (×5): qty 1

## 2021-01-17 NOTE — Evaluation (Signed)
Occupational Therapy Evaluation Patient Details Name: Heather Smith MRN: [id number] DOB: May 22, 1932 Today's Date: 01/17/2021   History of Present Illness 87 year old female brought to the emergency department by her family member due to sycope with decreased BP, fever and found to be positive for COVID.   PMH: L TKA. HTN, OA, PPM/defibrillator, CHF, spinal stenosis   Clinical Impression   Patient is currently requiring assistance with ADLs including Min guard to Min assist assist with toileting, Min guard assist with LE dressing, Min assist with bathing, as well as Min guard assist with standing and ambulation with a loss of balance during standing test for orthostatic hypotension.  Current level of function is below patient's typical baseline.  During this evaluation, patient was limited by generalized weakness, impaired activity tolerance, and mild confusion/slowed processing, all of which has the potential to impact patient's safety and independence during functional mobility, as well as performance for ADLs.  Patient lives with her spouse who has dementia and needs care. Pt's daughter currently at her home to help pt's spouse and can stay with pt and spouse while pt recovers as well.   Patient demonstrates good rehab potential, and should benefit from continued skilled occupational therapy services while in acute care to maximize safety, independence and quality of life at home.  Continued occupational therapy services in the home is recommended.  ?     Recommendations for follow up therapy are one component of a multi-disciplinary discharge planning process, led by the attending physician.  Recommendations may be updated based on patient status, additional functional criteria and insurance authorization.   Follow Up Recommendations  Home health OT    Assistance Recommended at Discharge Frequent or constant Supervision/Assistance  Functional Status Assessment  Patient has had a recent  decline in their functional status and demonstrates the ability to make significant improvements in function in a reasonable and predictable amount of time.  Equipment Recommendations  BSC/3in1    Recommendations for Other Services       Precautions / Restrictions Precautions Precautions: Fall Precaution Comments: Mon BP Restrictions Weight Bearing Restrictions: No      Mobility Bed Mobility Overal bed mobility: Modified Independent                  Transfers Overall transfer level: Needs assistance   Transfers: Sit to/from Stand;Bed to chair/wheelchair/BSC Sit to Stand: Min guard Stand pivot transfers: Min guard;Min assist                Balance Overall balance assessment: Needs assistance Sitting-balance support: Feet supported;Single extremity supported Sitting balance-Leahy Scale: Good     Standing balance support: Single extremity supported Standing balance-Leahy Scale: Poor Standing balance comment: Pt had loss of balance during standing Orthostatic test with LT arm relaxing at side and RT UE holding RW. Pt required Min As to safely lower back to EOB.                           ADL either performed or assessed with clinical judgement   ADL Overall ADL's : Needs assistance/impaired Eating/Feeding: Independent   Grooming: Wash/dry hands;Standing;Min guard Grooming Details (indicate cue type and reason): Cues for safety with Rw Upper Body Bathing: Sitting;Set up;Supervision/ safety   Lower Body Bathing: Minimal assistance;Sitting/lateral leans;Sit to/from stand   Upper Body Dressing : Set up;Sitting   Lower Body Dressing: Min guard;Minimal assistance Lower Body Dressing Details (indicate cue type and reason): Able to  perform figure 4 at EOB for doffing/donning socks with supervision. Min guard to Min As for standing. Toilet Transfer: Rolling walker (2 wheels);Ambulation;Min guard;Cueing for safety Toilet Transfer Details (indicate cue  type and reason): Pt ambulated from EOB to bathroom then to recliner with Min guard and use of RW, about 10' then 12'. Descending to toilet with Min guard and cues for grab bar. Toileting- Water quality scientist and Hygiene: Min guard;Sit to/from stand Toileting - Clothing Manipulation Details (indicate cue type and reason): Pt performed own hygiene. Min guard for clothing management.   Tub/Shower Transfer Details (indicate cue type and reason): NT Functional mobility during ADLs: Min guard;Rolling walker (2 wheels)       Vision   Vision Assessment?: No apparent visual deficits     Perception Perception Perception: Within Functional Limits   Praxis Praxis Praxis: Intact    Pertinent Vitals/Pain Pain Assessment: 0-10 Pain Score: 3  Pain Location: headache. Pt had Tylenol earlier.  Pt also reported ongoing LT shoulder and upper arm pain without memory of any trauma. Pain came with shoulder MMT with Rodena Goldmann ~4/10. Pt reports she has an appt with her MD to look at shoulder. Pain Descriptors / Indicators: Aching Pain Intervention(s): Premedicated before session     Hand Dominance Right   Extremity/Trunk Assessment Upper Extremity Assessment Upper Extremity Assessment: Generalized weakness   Lower Extremity Assessment Lower Extremity Assessment: Defer to PT evaluation   Cervical / Trunk Assessment Cervical / Trunk Assessment: Normal   Communication Communication Communication: No difficulties   Cognition Arousal/Alertness: Awake/alert Behavior During Therapy: WFL for tasks assessed/performed Overall Cognitive Status: No family/caregiver present to determine baseline cognitive functioning                                 General Comments: Slow processing.  Unable to state exact name of hospital but generally oriented x4     General Comments       Exercises     Shoulder Instructions      Home Living Family/patient expects to be discharged to:: Private  residence Living Arrangements: Spouse/significant other;Other (Comment) (Spouse with dementia and dizzy spells "a lot". Pt assits her husband with cues but rarely needs physical assistance.) Available Help at Discharge: Family;Available PRN/intermittently;Other (Comment) (Daughter and son live nearby. Dtr is retired and frequently available.) Type of Home: House Home Access: Stairs to enter Technical brewer of Steps: 4 Entrance Stairs-Rails: Right;Left;Can reach both [place]: One level     Bathroom Shower/Tub: Occupational psychologist: [place]: Kasandra Knudsen - single point   Additional Comments: Pt reports a wheelchair in the attic-unsure if Transport or full size.      Prior Functioning/Environment Prior Level of Function : Driving             Mobility Comments: Ambulates independently. ADLs Comments: Daughter brings groceries, cleans the house "sometimes",  takes pt to doctor.        OT Problem List: Impaired balance (sitting and/or standing);Decreased strength;Cardiopulmonary status limiting activity;Decreased activity tolerance;Decreased knowledge of use of DME or AE      OT Treatment/Interventions: Self-care/ADL training;Therapeutic exercise;Therapeutic activities;Cognitive remediation/compensation;Energy conservation;DME and/or AE instruction;Patient/family education;Balance training    OT Goals(Current goals can be found in the care plan section) Acute Rehab OT Goals Patient Stated Goal: Pt agrees that she does not need inpatient rehab and would like to go home with home health. OT  Goal Formulation: With patient Time For Goal Achievement: 01/31/21 Potential to Achieve Goals: Good ADL Goals Pt Will Transfer to Toilet: with modified independence;ambulating Pt Will Perform Toileting - Clothing Manipulation and hygiene: with modified independence Pt Will Perform Tub/Shower Transfer: with modified independence Additional ADL Goal #1: Pt  will engage in 10 min standing functional activities without loss of balance, in order to demonstrate improved activity tolerance and balance needed to perform ADLs safely at home. Additional ADL Goal #2: Pt will demonstrate improved mentation by scoring <4/10 on short blessed test and answering 4/4 safety questions correctly: 1. What do you do for yourself if you are sick with a cold.  2. What do you do if you burn yourself and the wound becomes infected.  3. What do you do if you experience severe chest pain and shortness of breath? 4. What number do you call in an emergency? Additional ADL Goal #3: Patient will identify at least 3 energy conservation strategies to employ at home in order to maximize function and quality of life and decrease caregiver burden while preventing exacerbation of symptoms and rehospitalization.  OT Frequency: Min 2X/week   Barriers to D/C:    Pt reports that her daughter can come stay with pt and spouse while pt is recovering at home.       Co-evaluation              AM-PAC OT "6 Clicks" Daily Activity     Outcome Measure Help from another person eating meals?: None Help from another person taking care of personal grooming?: A Little Help from another person toileting, which includes using toliet, bedpan, or urinal?: A Little Help from another person bathing (including washing, rinsing, drying)?: A Little Help from another person to put on and taking off regular upper body clothing?: A Little Help from another person to put on and taking off regular lower body clothing?: A Little 6 Click Score: 19   End of Session Equipment Utilized During Treatment: Rolling walker (2 wheels) Nurse Communication: Mobility status;Other (comment) (Orthostatics)  Activity Tolerance: Patient limited by fatigue Patient left: in chair;with call bell/phone within reach;with chair alarm set  OT Visit Diagnosis: Unsteadiness on feet (R26.81);Muscle weakness (generalized)  (M62.81);Other symptoms and signs involving cognitive function;Pain Pain - Right/Left: Left Pain - part of body: Shoulder                Time: 6948-5462 OT Time Calculation (min): 40 min Charges:  OT General Charges $OT Visit: 1 Visit OT Evaluation $OT Eval Low Complexity: 1 Low OT Treatments $Self Care/Home Management : 8-22 mins $Therapeutic Activity: 8-22 mins  Heather Smith, [place] Office: [phone] 01/17/2021  Julien Girt 01/17/2021, 9:48 AM

## 2021-01-17 NOTE — Care Management (Signed)
°  Transition of Care (TOC) Screening Note   Patient Details  Name: Heather Smith Date of Birth: 1932-09-19   [facility] CM/SW Contact:    Purcell Mouton, RN Phone Number: 01/17/2021, 11:00 AM    Transition of Care Department [facility]) has reviewed patient and no TOC needs have been identified at this time. We will continue to monitor patient advancement through interdisciplinary progression rounds. If new patient transition needs arise, please place a TOC consult.

## 2021-01-17 NOTE — Consult Note (Addendum)
Cardiology Consultation:   Patient ID: [name] MRN: [id number]; DOB: 01/02/1933  Admit date: 01/16/2021 Date of Consult: 01/17/2021  PCP:  Shirline Frees, MD   [facility] Providers Cardiologist:  Dr. Aundra Dubin (CHF), EP - Narda Bonds here to update MD or APP on Care Team, Refresh:1}     Patient Profile:   Heather Smith is a 87 y.o. female with a hx of NICM/chronic systolic CHF (normal cors 2014), LBBB, [employer] CRT-D, HTN, frequent PVCs, CKD 3a by labs, neuropathy of uncertain etiology, depression, HLD, back pain who is being seen 01/17/2021 for the evaluation of near-syncope at the request of Dr. Nevada Crane.  History of Present Illness:   [name] has a history of nonischemic cardiomyopathy and chronic systolic HF.  In 10/2012, she was admitted to [facility] with chest pain and new LV dysfunction EF 30-35%. Cardiac cath showed normal coronaries. She was started on meds for CHF. Given persistent LV dysfunction she had Medtonic CRT-D device placed 02/2013 (had baseline LBBB).  In 04/2013, the LV lead was not capturing properly so device had to be reprogrammed.  She had AV optimization in 06/2013.  Limited echo at that time showed improvement in EF to 40-45%. She has been followed since that time with serial echoes and CPXs. She also had PYP scan 02/2019 that was not suggestive of TTR amyloidosis. Last echo 12/06/20 showed EF 60-65%, grade 1 DD, mildly dilated LA. She last saw EP 04/2020 but note states this is a sensitive note to which I am not granted access. She also has hx of frequent PVCs and is on flecainide. Last OP device interrogation 12/2020 was normal.   She presented to [facility] with near-syncopal episode yesterday. She felt weak and fatigued when she woke up. When she stood up she felt like she was going to pass out. She did not fully lose consciousness but continued to feel woozy whenever she would sit or stand up. She also had about 30 minutes of vague CP without aggravating or  relieving factors. Resolved on its own. Also had noted some SOB. Her family also thought she was somewhat slow to respond. In the ED she was hypotensive at 84/47, febrile to 102.9, with labs notable for thrombocytopenia (down to 112), elevated d-dimer of 3, AKI with Cr 1.53 (prior baseline around 1.1-1.2). CXR low lung volumes without acute disease, + aortic athero. CT head nonacute. O2 sat 92-100% RA. She was vaccinated against Covid and had 2 prior boosters but did not yet get the updated booster this year. Per EDP, "Interrogation of pacemaker shows episodes of AT/AF this morning that were paced but no ventricular rhythms, no shocking of the device." I spoke with [employer] rep Leanna Sato who reviewed interrogation and clarifies the AT/AF episodes further - per Leanna Sato, she had brief atrial flutter yesterday - 3 separate episodes with average V-rate 110, average A-rate 240, lasting 8 min, 7 min, and 2 min. Otherwise NSR throughout, no prior atrial fib/flutter before yesterday.   She's been started on molnupiravir. Home Lasix, spironolactone, Entresto, and carvedilol were held. Her orthostatics still demonstrated a drop in BP to 79/50 this AM simply with sitting. Last BP 97/62, still febrile at 101.5. Carvedilol has been reordered to resume at a lower dose of 3.125mg  this afternoon. Cardiology asked to assist in management. She has a discharge order in for potential DC today but clinically does not appear ready yet.   Past Medical History:  Diagnosis Date   Cardiac resynchronization therapy defibrillator (CRT-D)  in place    Chronic kidney disease, stage 3a ([place])    Chronic systolic CHF (congestive heart failure) (HCC)    Complication of anesthesia    difficult to wake up and blood pressure drops   GERD (gastroesophageal reflux disease)    Hypercholesterolemia    Hypertension    Hypothyroidism    Left bundle branch block    LV lead partial dislodgment 05/30/2013   Neurogenic claudication due to lumbar spinal  stenosis    Neuropathy    Non-ischemic cardiomyopathy ([place])    [place] 10/04/12: Normal coronary arteries, EF 25-30% with global HK.  Echocardiogram 10/05/12: EF 30-35%, diffuse HK, moderate LAE. Echocardiogram (01/2013): Lateral to septal wall dyssynchrony EF 20-25%, diffuse HK   Osteoarthritis    "about all over" (02/22/2013)   PVC's (premature ventricular contractions)    Spinal stenosis    Vitamin D deficiency     Past Surgical History:  Procedure Laterality Date   APPENDECTOMY     BI-VENTRICULAR IMPLANTABLE CARDIOVERTER DEFIBRILLATOR N/A 02/22/2013   Procedure: BI-VENTRICULAR IMPLANTABLE CARDIOVERTER DEFIBRILLATOR  (CRT-D);  Surgeon: Deboraha Sprang, MD;  Location: [facility] CATH LAB;  Service: Cardiovascular;  Laterality: N/A;   BIV ICD GENERATOR CHANGEOUT N/A 08/19/2018   Procedure: BIV ICD GENERATOR CHANGEOUT;  Surgeon: Deboraha Sprang, MD;  Location: [facility] CV LAB;  Service: Cardiovascular;  Laterality: N/A;   BLEPHAROPLASTY Bilateral    EXCISIONAL HEMORRHOIDECTOMY     FOOT ARTHRODESIS, MODIFIED MCBRIDE Bilateral    KNEE ARTHROSCOPY Right    LEFT HEART CATHETERIZATION WITH CORONARY ANGIOGRAM N/A 10/04/2012   Procedure: LEFT HEART CATHETERIZATION WITH CORONARY ANGIOGRAM;  Surgeon: Sarabeth Benton M Martinique, MD;  Location: [facility] CATH LAB;  Service: Cardiovascular;  Laterality: N/A;   LUMBAR LAMINECTOMY  11/03/2006    Bilateral 3, 4, 5 laminectomy, partial L2, decompression of the thecal sac, foraminotomy, posterolateral arthrodesis L3-S1 with autograft   -- SURGEON:  Leeroy Cha, M.D.    TONSILLECTOMY     TOTAL KNEE ARTHROPLASTY Left 07/15/2015   Procedure: LEFT TOTAL KNEE ARTHROPLASTY;  Surgeon: Frederik Pear, MD;  Location: [facility];  Service: Orthopedics;  Laterality: Left;   TUBAL LIGATION     VAGINAL HYSTERECTOMY       Home Medications:  Prior to Admission medications   Medication Sig Start Date End Date Taking? Authorizing Provider  acetaminophen (TYLENOL) 325 MG tablet Take 325 mg by mouth every 6  (six) hours as needed for moderate pain or headache.   Yes [provider]  Ascorbic Acid (VITAMIN C) 1000 MG tablet Take 1,000 mg by mouth daily.   Yes [provider]  carvedilol (COREG) 6.25 MG tablet TAKE ONE TABLET BY MOUTH AT BREAKFAST AND AT BEDTIME Patient taking differently: Take 6.25 mg by mouth 2 (two) times daily with a meal. 09/19/20  Yes Larey Dresser, MD  diphenhydramine-acetaminophen (TYLENOL PM) 25-500 MG TABS tablet Take 1 tablet by mouth at bedtime as needed (sleep).   Yes [provider]  DULoxetine (CYMBALTA) 30 MG capsule Take 30 mg by mouth daily.  05/21/16  Yes [provider]  ezetimibe (ZETIA) 10 MG tablet Take 1 tablet (10 mg total) by mouth daily. 02/12/20 11/07/21 Yes Larey Dresser, MD  flecainide (TAMBOCOR) 50 MG tablet Take 1 tablet (50 mg total) by mouth 2 (two) times daily. 06/27/20  Yes Deboraha Sprang, MD  furosemide (LASIX) 20 MG tablet TAKE 1 TABLET (20 MG TOTAL) BY MOUTH AS NEEDED FOR FLUID (NO MORE THAN TWICE PER WEEK).  Patient taking differently: Take 20 mg by mouth daily as needed for fluid. TAKE 1 TABLET (20 MG TOTAL) BY MOUTH AS NEEDED FOR FLUID (NO MORE THAN TWICE PER WEEK). 01/03/20  Yes Larey Dresser, MD  levothyroxine (SYNTHROID, LEVOTHROID) 50 MCG tablet Take 50 mcg by mouth daily.   Yes [provider]  Magnesium 250 MG TABS Take 250 mg by mouth daily.   Yes [provider]  multivitamin [facility]) per tablet Take 1 tablet by mouth daily.   Yes [provider]  pantoprazole (PROTONIX) 40 MG tablet Take 40 mg by mouth daily.   Yes [provider]  Polyethyl Glycol-Propyl Glycol (SYSTANE OP) Place 1 drop into both eyes daily as needed (dry eyes).   Yes [provider]  sacubitril-valsartan (ENTRESTO) 24-26 MG Take 1 tablet by mouth 2 (two) times daily. 12/13/18  Yes Larey Dresser, MD  spironolactone (ALDACTONE) 25 MG tablet Take 1 tablet (25 mg total) by mouth daily.  09/05/18  Yes Larey Dresser, MD  vitamin B-12 (CYANOCOBALAMIN) 1000 MCG tablet Take 1,000 mcg by mouth daily.   Yes [provider]  albuterol (VENTOLIN HFA) 108 (90 Base) MCG/ACT inhaler Inhale 2 puffs into the lungs every 2 hours as needed for wheezing or shortness of breath. 01/17/21   Kayleen Memos, DO  carvedilol (COREG) 3.125 MG tablet Take 1 tablet by mouth 2 times daily with a meal. 01/17/21 02/16/21  Kayleen Memos, DO  Vitamin D3 (VITAMIN D) 25 MCG tablet Take 1 tablet (1,000 Units total) by mouth daily. 01/17/21 02/16/21  Kayleen Memos, DO  molnupiravir EUA (LAGEVRIO) 200 mg CAPS capsule Take 4 capsules by mouth 2  times daily for 5 days. 01/17/21 01/22/21  Kayleen Memos, DO  zinc sulfate 220 (50 Zn) MG capsule Take 1 capsule (220 mg total) by mouth daily. 01/17/21 02/16/21  Kayleen Memos, DO    Inpatient Medications: Scheduled Meds:  albuterol  2 puff Inhalation BID   vitamin C  1,000 mg Oral Daily   cholecalciferol  1,000 Units Oral Daily   DULoxetine  30 mg Oral Daily   enoxaparin (LOVENOX) injection  40 mg Subcutaneous Q24H   ezetimibe  10 mg Oral Daily   flecainide  50 mg Oral BID   levothyroxine  50 mcg Oral Daily   molnupiravir EUA  4 capsule Oral BID   zinc sulfate  220 mg Oral Daily   Continuous Infusions:  PRN Meds: acetaminophen, albuterol, chlorpheniramine-HYDROcodone, guaiFENesin-dextromethorphan  Allergies:    Allergies  Allergen Reactions   Statins Other (See Comments)    "Makes her bones hurt and very sore"   Niaspan [Niacin Er] Other (See Comments)    High doses, causes hot flashes & aching    Simvastatin Other (See Comments)   Benadryl [Diphenhydramine] Other (See Comments)    Makes pt feel jittery    Dilaudid [Hydromorphone Hcl] Itching    Social History:   Social History   Socioeconomic History   Marital status: Married    Spouse name: Not on file   Number of children: 4   Years of education: 13   Highest education level: Not  on file  Occupational History   Occupation: retired  Tobacco Use   Smoking status: Never   Smokeless tobacco: Never  Vaping Use   Vaping Use: Never used  Substance and Sexual Activity   Alcohol use: No   Drug use: No   Sexual activity: Not Currently  Other Topics  Concern   Not on file  Social History Narrative   Lives in [place], [place] with husband.  Active around the house.   Right-handed   Caffeine: 3-4 glasses tea per day   Social Determinants of Health   Financial Resource Strain: Not on file  Food Insecurity: Not on file  Transportation Needs: Not on file  Physical Activity: Not on file  Stress: Not on file  Social Connections: Not on file  Intimate Partner Violence: Not on file    Family History:    Family History  Problem Relation Age of Onset   Aneurysm Father        died @ 75.   Heart attack Mother        died @ 38.   Thyroid disease Mother    Lung cancer Mother    Hyperlipidemia Mother    Aneurysm Brother        has had an Aneurysm x3   Colon cancer Brother    Bone cancer Brother    Lung cancer Brother    Stomach cancer Brother    Neuropathy Neg Hx      ROS:  Please see the history of present illness.  All other ROS reviewed and negative.     Physical Exam/Data:   Vitals:   01/17/21 0114 01/17/21 0229 01/17/21 0513 01/17/21 1124  BP: (!) 103/46  (!) 114/56 97/62  Pulse: 83  78 66  Resp: 20  20 17   Temp: 98.8 F (37.1 C)  100.3 F (37.9 C) (!) 101.5 F (38.6 C)  TempSrc: Oral  Oral Oral  SpO2: 98%  92% 97%  Weight:  86.1 kg    Height:        Intake/Output Summary (Last 24 hours) at 01/17/2021 1139 Last data filed at 01/16/2021 2212 Gross per 24 hour  Intake 1239 ml  Output --  Net 1239 ml   Last 3 Weights 01/17/2021 01/16/2021 11/07/2020  Weight (lbs) 189 lb 14.4 oz 189 lb 14.4 oz 190 lb 9.6 oz  Weight (kg) 86.138 kg 86.138 kg 86.456 kg     Body mass index is 29.74 kg/m.  General: Well developed, well nourished WF, in no acute  distress. Head: Normocephalic, atraumatic, sclera non-icteric, no xanthomas, nares are without discharge. Flushed cheeks. Neck: Negative for carotid bruits. JVP not elevated. Lungs: Clear bilaterally to auscultation without wheezes, rales, or rhonchi. Breathing is unlabored. Heart: RRR S1 S2 without murmurs, rubs, or gallops.  Abdomen: Soft, non-tender, non-distended with normoactive bowel sounds. No rebound/guarding. Extremities: No clubbing or cyanosis. No edema. Distal pedal pulses are 2+ and equal bilaterally. Neuro: Alert and oriented X 3. Moves all extremities spontaneously. Psych:  Responds to questions appropriately with a normal affect.   EKG:  The EKG was personally reviewed and demonstrates:  NSR with BiV paced rhythm 80bpm  Telemetry:  Telemetry was personally reviewed and demonstrates:  NSR with occasional PVCs, couplets, triplets  Relevant CV Studies: 2D Echo 12/06/20  1. Left ventricular ejection fraction, by estimation, is 60 to 65%. The  left ventricle has normal function. The left ventricle has no regional  wall motion abnormalities. Left ventricular diastolic parameters are  consistent with Grade I diastolic  dysfunction (impaired relaxation).   2. Right ventricular systolic function is normal. The right ventricular  size is normal. There is normal pulmonary artery systolic pressure.   3. Left atrial size was mildly dilated.   4. The mitral valve is normal in structure. Trivial mitral valve  regurgitation. No  evidence of mitral stenosis.   5. The aortic valve is tricuspid. Aortic valve regurgitation is not  visualized. No aortic stenosis is present.   6. The inferior vena cava is normal in size with greater than 50%  respiratory variability, suggesting right atrial pressure of 3 mmHg.   Laboratory Data:  High Sensitivity Troponin:   Recent Labs  Lab 01/16/21 1208 01/16/21 1406  TROPONINIHS 8 8     Chemistry Recent Labs  Lab 01/16/21 1208 01/16/21 2240  01/17/21 0413  NA 134*  --  139  K 4.3  --  4.4  CL 103  --  105  CO2 23  --  24  GLUCOSE 201*  --  115*  BUN 24*  --  24*  CREATININE 1.53* 1.41* 1.29*  CALCIUM 9.0  --  8.4*  GFRNONAA 33* 36* 40*  ANIONGAP 8  --  10    Recent Labs  Lab 01/17/21 0413  PROT 6.6  ALBUMIN 3.9  AST 20  ALT 14  ALKPHOS 49  BILITOT 0.7   Lipids No results for input(s): CHOL, TRIG, HDL, LABVLDL, LDLCALC, CHOLHDL in the last 168 hours.  Hematology Recent Labs  Lab 01/16/21 1208 01/16/21 2240 01/17/21 0413  WBC 5.6 5.7 4.9  RBC 3.96 4.00 3.93  HGB 12.4 12.6 12.3  HCT 37.0 38.1 37.3  MCV 93.4 95.3 94.9  MCH 31.3 31.5 31.3  MCHC 33.5 33.1 33.0  RDW 13.2 13.4 13.4  PLT 140* 112* 112*   Thyroid No results for input(s): TSH, FREET4 in the last 168 hours.  BNP Recent Labs  Lab 01/16/21 2240  BNP 221.9*    DDimer  Recent Labs  Lab 01/16/21 2240 01/17/21 0413  DDIMER 3.03* 3.40*     Radiology/Studies:  DG Chest 2 View  Result Date: 01/16/2021 CLINICAL DATA:  87 year old female with history of shortness of breath. Near syncopal episode this morning. EXAM: CHEST - 2 VIEW COMPARISON:  Chest x-ray 07/15/2015. FINDINGS: Lung volumes are low. Eventration of the left hemidiaphragm. No consolidative airspace disease. No pleural effusions. No pneumothorax. No pulmonary nodule or mass noted. Pulmonary vasculature and the cardiomediastinal silhouette are within normal limits. Atherosclerotic calcifications are noted in the thoracic aorta. Left-sided biventricular pacemaker/AICD noted with lead tips projecting over the expected location of the right atrium, right ventricle and left ventricle via the coronary sinus and coronary veins. IMPRESSION: 1. Low lung volumes without radiographic evidence of acute cardiopulmonary disease. 2. Aortic atherosclerosis. Electronically Signed   By: Vinnie Langton M.D.   On: 01/16/2021 12:21   CT Head Wo Contrast  Result Date: 01/16/2021 CLINICAL DATA:  Altered  mental status near syncope EXAM: CT HEAD WITHOUT CONTRAST TECHNIQUE: Contiguous axial images were obtained from the base of the skull through the vertex without intravenous contrast. COMPARISON:  04/22/2005 FINDINGS: Brain: No acute intracranial findings are seen. Ventricles are not dilated. There is no shift of midline structures. Cortical sulci are prominent. There is subcentimeter low-density in the mesial left temporal lobe and basal ganglia with no significant interval change. This may suggest parahippocampal cyst or old lacunar infarct. Vascular: Unremarkable. Skull: Unremarkable. Sinuses/Orbits: Unremarkable. Other: No significant interval changes are noted. IMPRESSION: No acute intracranial findings are seen in noncontrast CT brain. Atrophy. Electronically Signed   By: Elmer Picker M.D.   On: 01/16/2021 12:41     Assessment and Plan:   1. Near syncope with hypotension, Covid, AKI, ongoing fever - near-syncope is likely related to documented significant orthostasis in the  context of Covid infection and ongoing fever - she continued to drop SBP to 79 systolic this AM just sitting so not yet ready to resume meds - will hold the resumption order on carvedilol and review with MD; may need to consider low dose metoprolol in the meantime given ongoing flecainide use - volume status otherwise looks OK - given her hypotension and continued fever in the presence of a device I will also obtain blood cultures - appreciate ongoing IM management of medical issues - will d/c discharge order  2. Prior hx of chronic systolic CHF/NICM - managing in the context above with Entresto, carvedilol, spironolactone, Lasix on hold - last EF 12/2020 normalized -> repeat limited echo  3 Chest pain x 30 minutes - ?precipitated by hypotension or atrial flutter - hsTroponin negative, arguing against acute ischemia - will repeat limited echo - will defer consideration of PE rule out to IM team given Covid and  elevated d-dimer  4. Frequent PVCs - K normal, check Mg and TSH - remains on flecainide - will discuss beta blocker plan with MD - may need to consider transitioning to metoprolol short term while still having hypotension  5. Atrial flutter, new diagnosis - per [employer] rep, patient had 3 separate episodes yesterday with A-rate 240-260, V-rate 110, lasting 8 min, 7 min, 2 min - check TSH, Mg - will discuss further with MD as this is new for patient but episodes were very brief and possibly provoked by acute illness - currently in NSR  6. AKI on probable CKD stage 3a - last OP Cr was 1.16, admitted at 1.53, down to 1.29 today with holding of meds - continue to follow with med holding  Risk Assessment/Risk Scores:     HEAR Score (for undifferentiated chest pain):  pathway not available, certificate error given. Troponins negative.  New York Heart Association (NYHA) Functional Class NYHA Class II-III  CHA2DS2-VASc Score = 6   This indicates a 9.7% annual risk of stroke. The patient's score is based upon: CHF History: 1 HTN History: 1 Diabetes History: 0 Stroke History: 0 Vascular Disease History: 1 (aortic atherosclerosis) Age Score: 2 Gender Score: 1     For questions or updates, please contact [facility] Please consult [email or web address] for contact info under    Signed, Charlie Pitter, PA-C  01/17/2021 11:39 AM   Patient examined chart reviewed Discussed care with daughter and patient Postural hypotension due to febrile illness and medication for CHF Bedside echo being done EF looks normal on initial images Exam with PPM under left clavicle no murmur lungs clear abdomen soft no edema. Telemetry with AV pacing According to [employer] brief flutter. She is covid positive with fever and on exam dry. Hold entresto and diuretics Continue coreg would not start anticoagulation with low PLTls and brivity of flutter Can monitor % arrhythmia with PPM. Ok to hydrate as she is mildly  azotemic and EF normal   Jenkins Rouge MD [facility]

## 2021-01-17 NOTE — Progress Notes (Signed)
Telemetry called stating pt had a 7-beat run of VTach. Pt said she felt her heart was beating fast during beats but is not anymore. VS checked and stable. MD notified.

## 2021-01-17 NOTE — Plan of Care (Signed)

## 2021-01-17 NOTE — Plan of Care (Signed)
°  Problem: Education: Goal: Knowledge of General Education information will improve Description: Including pain rating scale, medication(s)/side effects and non-pharmacologic comfort measures Outcome: Progressing   Problem: Health Behavior/Discharge Planning: Goal: Ability to manage health-related needs will improve Outcome: Progressing   Problem: Clinical Measurements: Goal: Ability to maintain clinical measurements within normal limits will improve Outcome: Progressing Goal: Will remain free from infection Outcome: Progressing Goal: Diagnostic test results will improve Outcome: Progressing Goal: Respiratory complications will improve Outcome: Progressing Goal: Cardiovascular complication will be avoided Outcome: Progressing   Problem: Activity: Goal: Risk for activity intolerance will decrease Outcome: Progressing   Problem: Coping: Goal: Level of anxiety will decrease Outcome: Progressing   Problem: Pain Managment: Goal: General experience of comfort will improve Outcome: Progressing   Problem: Safety: Goal: Ability to remain free from injury will improve Outcome: Progressing   Problem: Skin Integrity: Goal: Risk for impaired skin integrity will decrease Outcome: Progressing   Problem: Education: Goal: Knowledge of risk factors and measures for prevention of condition will improve Outcome: Progressing   Problem: Coping: Goal: Psychosocial and spiritual needs will be supported Outcome: Progressing   Problem: Respiratory: Goal: Will maintain a patent airway Outcome: Progressing   Problem: Nutrition: Goal: Adequate nutrition will be maintained Outcome: Not Progressing   Problem: Elimination: Goal: Will not experience complications related to bowel motility Outcome: Not Progressing Goal: Will not experience complications related to urinary retention Outcome: Not Progressing

## 2021-01-17 NOTE — Evaluation (Signed)
Physical Therapy Evaluation Patient Details Name: Heather Smith MRN: [id number] DOB: 12-12-1932 Today's Date: 01/17/2021  History of Present Illness  87 year old female brought to the emergency department by her family member due to sycope with decreased BP, fever and found to be positive for COVID.   PMH: L TKA. HTN, OA, PPM/defibrillator, CHF, spinal stenosis  Clinical Impression  Pt admitted with above diagnosis.  Pt currently with functional limitations due to the deficits listed below (see PT Problem List). Pt will benefit from skilled PT to increase their independence and safety with mobility to allow discharge to the venue listed below.   Pt reports generalized weakness and fatigue however has been OOB multiple times today per pt and nursing staff.  Pt agreeable to ambulate however remained in room per pt preference as she required a few seated rest breaks.  Pt denies dizziness this afternoon (vitals stable, see mobility section below).  Pt plans to return home upon d/c with daughter assisting as needed.      Recommendations for follow up therapy are one component of a multi-disciplinary discharge planning process, led by the attending physician.  Recommendations may be updated based on patient status, additional functional criteria and insurance authorization.  Follow Up Recommendations Home health PT (may progress to no needs)    Assistance Recommended at Discharge PRN  Functional Status Assessment Patient has had a recent decline in their functional status and demonstrates the ability to make significant improvements in function in a reasonable and predictable amount of time.  Equipment Recommendations  None recommended by PT    Recommendations for Other Services       Precautions / Restrictions Precautions Precautions: Fall Precaution Comments: monitor BP      Mobility  Bed Mobility Overal bed mobility: Modified Independent                  Transfers Overall  transfer level: Needs assistance Equipment used: None Transfers: Sit to/from Stand Sit to Stand: Min guard Stand pivot transfers: Min guard         General transfer comment: min/guard for safety due to lower BPs earlier today    Ambulation/Gait Ambulation/Gait assistance: Min guard Gait Distance (Feet): 12 Feet (x5) Assistive device: None Gait Pattern/deviations: Step-through pattern;Decreased stride length       General Gait Details: pt ambulated short distances around room multiple times with seated rest breaks due to generalized weakness and fatigue, pt denies dizziness; BP 120/73 mmHg in sitting and SPO2 96% on room air after ambulating  Stairs            Wheelchair Mobility    Modified Rankin (Stroke Patients Only)       Balance Overall balance assessment: Needs assistance         Standing balance support: No upper extremity supported Standing balance-Leahy Scale: Fair                               Pertinent Vitals/Pain Pain Assessment: No/denies pain Pain Intervention(s): Repositioned;Monitored during session    [name] expects to be discharged to:: Private residence Living Arrangements: Spouse/significant other;Other (Comment) (Spouse with dementia and dizzy spells "a lot". Pt assits her husband with cues but rarely needs physical assistance.) Available Help at Discharge: Family;Available PRN/intermittently;Other (Comment) (Daughter and son live nearby. Dtr is retired and frequently available.) Type of Home: House Home Access: Stairs to enter Entrance Stairs-Rails: Right;Left;Can reach both Entrance  Stairs-Number of Steps: 4   Home Layout: One level Home Equipment: [place] - single point Additional Comments: Pt reports a wheelchair in the attic-unsure if Transport or full size.    Prior Function Prior Level of Function : Driving             Mobility Comments: Ambulates independently. ADLs Comments: Daughter  brings groceries, cleans the house "sometimes",  takes pt to doctor.     Hand Dominance   Dominant Hand: Right    Extremity/Trunk Assessment        Lower Extremity Assessment Lower Extremity Assessment: Generalized weakness       Communication   Communication: No difficulties  Cognition Arousal/Alertness: Awake/alert Behavior During Therapy: WFL for tasks assessed/performed Overall Cognitive Status: Within Functional Limits for tasks assessed                                          General Comments      Exercises     Assessment/Plan    PT Assessment Patient needs continued PT services  PT Problem List Decreased strength;Decreased activity tolerance;Decreased mobility       PT Treatment Interventions DME instruction;Therapeutic exercise;Gait training;Functional mobility training;Therapeutic activities;Patient/family education    PT Goals (Current goals can be found in the Care Plan section)  Acute Rehab PT Goals PT Goal Formulation: With patient Time For Goal Achievement: 01/31/21 Potential to Achieve Goals: Good    Frequency Min 3X/week   Barriers to discharge        Co-evaluation               AM-PAC PT "6 Clicks" Mobility  Outcome Measure Help needed turning from your back to your side while in a flat bed without using bedrails?: A Little Help needed moving from lying on your back to sitting on the side of a flat bed without using bedrails?: A Little Help needed moving to and from a bed to a chair (including a wheelchair)?: A Little Help needed standing up from a chair using your arms (e.g., wheelchair or bedside chair)?: A Little Help needed to walk in hospital room?: A Little Help needed climbing 3-5 steps with a railing? : A Little 6 Click Score: 18    End of Session   Activity Tolerance: Patient tolerated treatment well Patient left: in bed;with call bell/phone within reach Nurse Communication: Mobility status PT Visit  Diagnosis: Difficulty in walking, not elsewhere classified (R26.2)    Time: 2876-8115 PT Time Calculation (min) (ACUTE ONLY): 19 min   Charges:   PT Evaluation $PT Eval Low Complexity: 1 Low        Kati PT, DPT Acute Rehabilitation Services Pager: [phone] Office: [phone]   Myrtis Hopping [place] 01/17/2021, 4:42 PM

## 2021-01-17 NOTE — Progress Notes (Signed)
PROGRESS NOTE  Heather Smith RDE:[id number] DOB: 1932/11/19 DOA: 01/16/2021 PCP: Shirline Frees, MD  HPI/Recap of past 24 hours: Heather Smith is a 87 y.o. female with medical history significant of chronic diastolic CHF, history of HFrEF 30 to 35% status post PPM placement, hypertension, hyperlipidemia, hypothyroidism, nonischemic CAD who presented to [facility] ED with unwitnessed syncopal episode on the day of hospital visit.  Felt lightheaded prior to passing out.  Unclear how long she was unconscious.  EMS was activated.  Upon EMS arrival she was hypotensive and received IV fluid with improvement of her blood pressure.  She was noted to be febrile with T-max of 102.9.  Upon arrival to the ED she was found to be positive for COVID-19 viral infection, head CT and chest x-ray were nonacute.    Of note, patient's pacemaker was interrogated per EDP which was reviewed with cardiology over the phone.   She received IV fluid hydration and was started on antiviral Molnupiravir.  01/17/2021: Patient was seen and examined at bedside.  Weak appearing.  Blood pressure soft, cardiac medications had to be held and cardiology consulted.   Assessment/Plan: Principal Problem:   Syncope Active Problems:   HTN (hypertension)   Hyperlipemia   Chronic systolic heart failure (HCC)   Implantable cardioverter-defibrillator (ICD) in situ   Depression   Chronic diastolic congestive heart failure ([place])   ARF (acute renal failure) (HCC)   Dehydration   COVID-19 virus infection  ?Unwitnessed syncope suspect secondary to orthostatic hypotension Positive orthostatic vital signs Cardiac medication held due to hypotension. Received IV fluid with improvement of blood pressure. Cardiology consulted and assisting with the management. Limited echo 01/17/2021 pending.  COVID-19 viral infection Chest x-ray nonacute Started on antiviral Molpunavir Vitamin D3, C, zinc. Bronchodilators as needed Maintain a  saturation above 92% Incentive spirometer/flutter valve Mobilize as tolerated.  New diagnosis of atrial flutter seen on PPM post interrogation Follow TSH Optimize magnesium and potassium level Cardiology following  Worsening thrombocytopenia in the setting of COVID-19 viral infection Suspect related to COVID-19 viral infection Monitor for now Hemoglobin stable 12.3.  History of systolic CHF 63-14% post PPM placement Repeated 2D echo on 12/06/2020 with normal LVEF Cardiac medications on hold due to orthostatic hypotension. Strict I's and O's and Daily weight Management per cardiology  Resolving AKI on CKD 3B suspect multifactorial with in the setting of dehydration and hypotension Presented with creatinine of 1.5. Repeated creatinine this morning 1.2 GFR 43. Continue to avoid nephrotoxic agent, dehydration and hypotension Monitor urine output with strict I's and O's.  Hypothyroidism Follow TSH Continue home levothyroxine  Hyperlipidemia Continue home Zetia  Chronic anxiety/depression Continue home Cymbalta  Physical debility OT assessed and recommended home health OT Continue PT OT with assistance and fall precautions.     Code Status: Full code  Family Communication: Daughter at bedside  Disposition Plan: Likely will discharge to home with home health services once cardiology signs off.   Consultants: Cardiology  Procedures: 2D echo on 01/17/2021.  Antimicrobials: Antiviral Molpunavir  DVT prophylaxis: Subcu Lovenox daily.  Status is: Inpatient  Inpatient status.  Patient requires at least 2 midnights for further evaluation and treatment of present condition.      Objective: Vitals:   01/17/21 0114 01/17/21 0229 01/17/21 0513 01/17/21 1124  BP: (!) 103/46  (!) 114/56 97/62  Pulse: 83  78 66  Resp: 20  20 17   Temp: 98.8 F (37.1 C)  100.3 F (37.9 C) (!) 101.5 F (38.6 C)  TempSrc: Oral  Oral Oral  SpO2: 98%  92% 97%  Weight:  86.1 kg     Height:        Intake/Output Summary (Last 24 hours) at 01/17/2021 1530 Last data filed at 01/16/2021 2212 Gross per 24 hour  Intake 240 ml  Output --  Net 240 ml   Filed Weights   01/16/21 2152 01/17/21 0229  Weight: 86.1 kg 86.1 kg    Exam:  General: 87 y.o. year-old female well developed well nourished in no acute distress.  Alert and oriented x3. Cardiovascular: Regular rate and rhythm with no rubs or gallops.  No thyromegaly or JVD noted.   Respiratory: Clear to auscultation with no wheezes or rales. Good inspiratory effort. Abdomen: Soft nontender nondistended with normal bowel sounds x4 quadrants. Musculoskeletal: No lower extremity edema. 2/4 pulses in all 4 extremities. Skin: No ulcerative lesions noted or rashes, Psychiatry: Mood is appropriate for condition and setting   Data Reviewed: CBC: Recent Labs  Lab 01/16/21 1208 01/16/21 2240 01/17/21 0413  WBC 5.6 5.7 4.9  HGB 12.4 12.6 12.3  HCT 37.0 38.1 37.3  MCV 93.4 95.3 94.9  PLT 140* 112* 233*   Basic Metabolic Panel: Recent Labs  Lab 01/16/21 1208 01/16/21 2240 01/17/21 0413 01/17/21 1115  NA 134*  --  139  --   K 4.3  --  4.4  --   CL 103  --  105  --   CO2 23  --  24  --   GLUCOSE 201*  --  115*  --   BUN 24*  --  24*  --   CREATININE 1.53* 1.41* 1.29*  --   CALCIUM 9.0  --  8.4*  --   MG  --   --   --  1.8   GFR: Estimated Creatinine Clearance: 35.3 mL/min (A) (by C-G formula based on SCr of 1.29 mg/dL (H)). Liver Function Tests: Recent Labs  Lab 01/17/21 0413  AST 20  ALT 14  ALKPHOS 49  BILITOT 0.7  PROT 6.6  ALBUMIN 3.9   No results for input(s): LIPASE, AMYLASE in the last 168 hours. No results for input(s): AMMONIA in the last 168 hours. Coagulation Profile: No results for input(s): INR, PROTIME in the last 168 hours. Cardiac Enzymes: No results for input(s): CKTOTAL, CKMB, CKMBINDEX, TROPONINI in the last 168 hours. BNP (last 3 results) No results for input(s): PROBNP  in the last 8760 hours. HbA1C: No results for input(s): HGBA1C in the last 72 hours. CBG: No results for input(s): GLUCAP in the last 168 hours. Lipid Profile: No results for input(s): CHOL, HDL, LDLCALC, TRIG, CHOLHDL, LDLDIRECT in the last 72 hours. Thyroid Function Tests: Recent Labs    01/17/21 1115  TSH 0.829   Anemia Panel: Recent Labs    01/16/21 2240 01/17/21 0413  FERRITIN 65 72   Urine analysis:    Component Value Date/Time   COLORURINE YELLOW 01/16/2021 1335   APPEARANCEUR HAZY (A) 01/16/2021 1335   LABSPEC >=1.030 01/16/2021 1335   PHURINE 5.0 01/16/2021 1335   GLUCOSEU NEGATIVE 01/16/2021 1335   HGBUR NEGATIVE 01/16/2021 1335   BILIRUBINUR NEGATIVE 01/16/2021 1335   KETONESUR TRACE (A) 01/16/2021 1335   PROTEINUR TRACE (A) 01/16/2021 1335   UROBILINOGEN 0.2 01/30/2014 1511   NITRITE NEGATIVE 01/16/2021 1335   LEUKOCYTESUR NEGATIVE 01/16/2021 1335   Sepsis Labs: @LABRCNTIP (procalcitonin:4,lacticidven:4)  ) Recent Results (from the past 240 hour(s))  Resp Panel by RT-PCR (Flu A&B, Covid) Nasopharyngeal Swab  Status: Abnormal   Collection Time: 01/16/21  1:38 PM   Specimen: Nasopharyngeal Swab; Nasopharyngeal(NP) swabs in vial transport medium  Result Value Ref Range Status   SARS Coronavirus 2 by RT PCR POSITIVE (A) NEGATIVE Final    Comment: (NOTE) SARS-CoV-2 target nucleic acids are DETECTED.  The SARS-CoV-2 RNA is generally detectable in upper respiratory specimens during the acute phase of infection. Positive results are indicative of the presence of the identified virus, but do not rule out bacterial infection or co-infection with other pathogens not detected by the test. Clinical correlation with patient history and other diagnostic information is necessary to determine patient infection status. The expected result is Negative.  Fact Sheet for Patients: EntrepreneurPulse.com.au  Fact Sheet for Healthcare  Providers: IncredibleEmployment.be  This test is not yet approved or cleared by the [place] FDA and  has been authorized for detection and/or diagnosis of SARS-CoV-2 by FDA under an Emergency Use Authorization (EUA).  This EUA will remain in effect (meaning this test can be used) for the duration of  the COVID-19 declaration under Section 564(b)(1) of the A ct, 21 U.S.C. section 360bbb-3(b)(1), unless the authorization is terminated or revoked sooner.     Influenza A by PCR NEGATIVE NEGATIVE Final   Influenza B by PCR NEGATIVE NEGATIVE Final    Comment: (NOTE) The Xpert Xpress SARS-CoV-2/FLU/RSV plus assay is intended as an aid in the diagnosis of influenza from Nasopharyngeal swab specimens and should not be used as a sole basis for treatment. Nasal washings and aspirates are unacceptable for Xpert Xpress SARS-CoV-2/FLU/RSV testing.  Fact Sheet for Patients: EntrepreneurPulse.com.au  Fact Sheet for Healthcare Providers: IncredibleEmployment.be  This test is not yet approved or cleared by the [place] FDA and has been authorized for detection and/or diagnosis of SARS-CoV-2 by FDA under an Emergency Use Authorization (EUA). This EUA will remain in effect (meaning this test can be used) for the duration of the COVID-19 declaration under Section 564(b)(1) of the Act, 21 U.S.C. section 360bbb-3(b)(1), unless the authorization is terminated or revoked.  Performed at [facility], [place] [address], [place], [place] [address]       Studies: No results found.  Scheduled Meds:  albuterol  2 puff Inhalation BID   vitamin C  1,000 mg Oral Daily   cholecalciferol  1,000 Units Oral Daily   DULoxetine  30 mg Oral Daily   enoxaparin (LOVENOX) injection  40 mg Subcutaneous Q24H   ezetimibe  10 mg Oral Daily   flecainide  50 mg Oral BID   levothyroxine  50 mcg Oral Daily   molnupiravir EUA  4 capsule  Oral BID   zinc sulfate  220 mg Oral Daily    Continuous Infusions:   LOS: 0 days     Kayleen Memos, MD [facility] Pager [phone]  If 7PM-7AM, please contact night-coverage [email or web address] Password [email or web address] 01/17/2021, 3:30 PM

## 2021-01-18 DIAGNOSIS — U071 COVID-19: Secondary | ICD-10-CM

## 2021-01-18 DIAGNOSIS — I5032 Chronic diastolic (congestive) heart failure: Secondary | ICD-10-CM

## 2021-01-18 LAB — BASIC METABOLIC PANEL
Anion gap: 10 (ref 5–15)
BUN: 22 mg/dL (ref 8–23)
CO2: 22 mmol/L (ref 22–32)
Calcium: 8.3 mg/dL — ABNORMAL LOW (ref 8.9–10.3)
Chloride: 103 mmol/L (ref 98–111)
Creatinine, Ser: 1.1 mg/dL — ABNORMAL HIGH (ref 0.44–1.00)
GFR, Estimated: 49 mL/min — ABNORMAL LOW (ref >60)
Glucose, Bld: 90 mg/dL (ref 70–99)
Potassium: 4.2 mmol/L (ref 3.5–5.1)
Sodium: 135 mmol/L (ref 135–145)

## 2021-01-18 LAB — CBC
HCT: 38.8 % (ref 36.0–46.0)
Hemoglobin: 13.4 g/dL (ref 12.0–15.0)
MCH: 31.8 pg (ref 26.0–34.0)
MCHC: 34.5 g/dL (ref 30.0–36.0)
MCV: 91.9 fL (ref 80.0–100.0)
Platelets: 117 10*3/uL — ABNORMAL LOW (ref 150–400)
RBC: 4.22 MIL/uL (ref 3.87–5.11)
RDW: 13.4 % (ref 11.5–15.5)
WBC: 4.6 10*3/uL (ref 4.0–10.5)
nRBC: 0 % (ref 0.0–0.2)

## 2021-01-18 LAB — C-REACTIVE PROTEIN: CRP: 3.4 mg/dL — ABNORMAL HIGH (ref <1.0)

## 2021-01-18 LAB — FERRITIN: Ferritin: 166 ng/mL (ref 11–307)

## 2021-01-18 LAB — D-DIMER, QUANTITATIVE: D-Dimer, Quant: 2.56 ug/mL-FEU — ABNORMAL HIGH (ref 0.00–0.50)

## 2021-01-18 MED ORDER — CARVEDILOL 3.125 MG PO TABS
3.1250 mg | ORAL_TABLET | Freq: Two times a day (BID) | ORAL | Status: DC
  Administered 2021-01-19: 08:00:00 3.125 mg via ORAL
  Filled 2021-01-18 (×2): qty 1

## 2021-01-18 MED ORDER — LACTATED RINGERS IV SOLN: INTRAVENOUS | Status: AC

## 2021-01-18 NOTE — Plan of Care (Signed)
°  Problem: Activity: Goal: Risk for activity intolerance will decrease Outcome: Progressing   Problem: Nutrition: Goal: Adequate nutrition will be maintained Outcome: Progressing   Problem: Elimination: Goal: Will not experience complications related to bowel motility Outcome: Progressing   Problem: Safety: Goal: Ability to remain free from injury will improve Outcome: Progressing

## 2021-01-18 NOTE — Progress Notes (Signed)
Progress Note  Patient Name: Heather Smith Date of Encounter: 01/18/2021  [facility] Cardiologist: McLean/Klein  Subjective   Remains weak.  Inpatient Medications    Scheduled Meds:  albuterol  2 puff Inhalation BID   vitamin C  1,000 mg Oral Daily   cholecalciferol  1,000 Units Oral Daily   DULoxetine  30 mg Oral Daily   enoxaparin (LOVENOX) injection  40 mg Subcutaneous Q24H   ezetimibe  10 mg Oral Daily   flecainide  50 mg Oral BID   levothyroxine  50 mcg Oral Daily   molnupiravir EUA  4 capsule Oral BID   zinc sulfate  220 mg Oral Daily   Continuous Infusions:  lactated ringers 50 mL/hr at 01/18/21 0609   PRN Meds: acetaminophen, albuterol, chlorpheniramine-HYDROcodone, guaiFENesin-dextromethorphan   Vital Signs    Vitals:   01/17/21 1915 01/17/21 2109 01/17/21 2118 01/18/21 0434  BP:  124/70 109/71 (!) 113/50  Pulse:  (!) 101 70 (!) 58  Resp:  16 20 16   Temp: 98.5 F (36.9 C) 98.4 F (36.9 C) 99 F (37.2 C) 98.9 F (37.2 C)  TempSrc:  Oral Oral Oral  SpO2:  97% 93% 93%  Weight:      Height:        Intake/Output Summary (Last 24 hours) at 01/18/2021 1008 Last data filed at 01/18/2021 5176 Gross per 24 hour  Intake 15.33 ml  Output --  Net 15.33 ml   Last 3 Weights 01/17/2021 01/16/2021 11/07/2020  Weight (lbs) 189 lb 14.4 oz 189 lb 14.4 oz 190 lb 9.6 oz  Weight (kg) 86.138 kg 86.138 kg 86.456 kg      Telemetry    Appears to be atrial sensed, ventricular paced rhythm with frequent PVCs, rare couplets; brief periods of wide QRS rhythm probably represent brief atrial flutter with native AV conduction - personally Reviewed  ECG    Atrial sensed-ventricular paced (sinus rhythm with biventricular pacing) - Personally Reviewed  Physical Exam  deferred  Labs    High Sensitivity Troponin:   Recent Labs  Lab 01/16/21 1208 01/16/21 1406  TROPONINIHS 8 8     Chemistry Recent Labs  Lab 01/16/21 1208 01/16/21 2240 01/17/21 0413  01/17/21 1115 01/18/21 0421  NA 134*  --  139  --  135  K 4.3  --  4.4  --  4.2  CL 103  --  105  --  103  CO2 23  --  24  --  22  GLUCOSE 201*  --  115*  --  90  BUN 24*  --  24*  --  22  CREATININE 1.53* 1.41* 1.29*  --  1.10*  CALCIUM 9.0  --  8.4*  --  8.3*  MG  --   --   --  1.8  --   PROT  --   --  6.6  --   --   ALBUMIN  --   --  3.9  --   --   AST  --   --  20  --   --   ALT  --   --  14  --   --   ALKPHOS  --   --  49  --   --   BILITOT  --   --  0.7  --   --   GFRNONAA 33* 36* 40*  --  49*  ANIONGAP 8  --  10  --  10    Lipids No  results for input(s): CHOL, TRIG, HDL, LABVLDL, LDLCALC, CHOLHDL in the last 168 hours.  Hematology Recent Labs  Lab 01/16/21 2240 01/17/21 0413 01/18/21 0421  WBC 5.7 4.9 4.6  RBC 4.00 3.93 4.22  HGB 12.6 12.3 13.4  HCT 38.1 37.3 38.8  MCV 95.3 94.9 91.9  MCH 31.5 31.3 31.8  MCHC 33.1 33.0 34.5  RDW 13.4 13.4 13.4  PLT 112* 112* 117*   Thyroid  Recent Labs  Lab 01/17/21 1115  TSH 0.829    BNP Recent Labs  Lab 01/16/21 2240  BNP 221.9*    DDimer  Recent Labs  Lab 01/16/21 2240 01/17/21 0413 01/18/21 0421  DDIMER 3.03* 3.40* 2.56*     Radiology    DG Chest 2 View  Result Date: 01/16/2021 CLINICAL DATA:  87 year old female with history of shortness of breath. Near syncopal episode this morning. EXAM: CHEST - 2 VIEW COMPARISON:  Chest x-ray 07/15/2015. FINDINGS: Lung volumes are low. Eventration of the left hemidiaphragm. No consolidative airspace disease. No pleural effusions. No pneumothorax. No pulmonary nodule or mass noted. Pulmonary vasculature and the cardiomediastinal silhouette are within normal limits. Atherosclerotic calcifications are noted in the thoracic aorta. Left-sided biventricular pacemaker/AICD noted with lead tips projecting over the expected location of the right atrium, right ventricle and left ventricle via the coronary sinus and coronary veins. IMPRESSION: 1. Low lung volumes without radiographic  evidence of acute cardiopulmonary disease. 2. Aortic atherosclerosis. Electronically Signed   By: Vinnie Langton M.D.   On: 01/16/2021 12:21   CT Head Wo Contrast  Result Date: 01/16/2021 CLINICAL DATA:  Altered mental status near syncope EXAM: CT HEAD WITHOUT CONTRAST TECHNIQUE: Contiguous axial images were obtained from the base of the skull through the vertex without intravenous contrast. COMPARISON:  04/22/2005 FINDINGS: Brain: No acute intracranial findings are seen. Ventricles are not dilated. There is no shift of midline structures. Cortical sulci are prominent. There is subcentimeter low-density in the mesial left temporal lobe and basal ganglia with no significant interval change. This may suggest parahippocampal cyst or old lacunar infarct. Vascular: Unremarkable. Skull: Unremarkable. Sinuses/Orbits: Unremarkable. Other: No significant interval changes are noted. IMPRESSION: No acute intracranial findings are seen in noncontrast CT brain. Atrophy. Electronically Signed   By: Elmer Picker M.D.   On: 01/16/2021 12:41   ECHOCARDIOGRAM LIMITED  Result Date: 01/17/2021    ECHOCARDIOGRAM LIMITED REPORT   Patient Name:   Heather Smith Date of Exam: 01/17/2021 Medical Rec #:  [id number]        Height:       67.0 in Accession #:    [id number]       Weight:       189.9 lb Date of Birth:  1933-01-24        BSA:          1.978 m Patient Age:    16 years         BP:           114/56 mmHg Patient Gender: F                HR:           69 bpm. Exam Location:  Inpatient Procedure: 2D Echo, Limited Echo, Cardiac Doppler and Color Doppler Indications:    Syncope  History:        Patient has prior history of Echocardiogram examinations. CHF;                 Risk Factors:Hypertension.  Sonographer:    Jyl Heinz Referring Phys: Chambers  1. Normal LV and RV function and size. EF 55-60%. No regional wall motion abnormalities. Grade I diastolic dysfunction (normal for age).  2.  Pacemaker lead is visualized  3. Left atrium is moderately dilated. The right atrium is normal  4. No significant valvular abnormalities  5. The IVC is not dilated, RA pressure 3 mmHg  6. There is normal pulmonary artery systolic pressure. Comparison(s): No significant change from prior study. FINDINGS  Left Ventricle: Normal LV and RV function and size. EF 55-60%. No regional wall motion abnormalities. Grade I diastolic dysfunction (normal for age). Right Ventricle: There is normal pulmonary artery systolic pressure. The tricuspid regurgitant velocity is 2.19 m/s, and with an assumed right atrial pressure of 3 mmHg, the estimated right ventricular systolic pressure is 82.4 mmHg. Aortic Valve: Aortic valve peak gradient measures 6.8 mmHg. LEFT VENTRICLE PLAX 2D LVIDd:         4.60 cm      Diastology LVIDs:         3.30 cm      LV e' medial:    5.98 cm/s LV PW:         1.10 cm      LV E/e' medial:  14.8 LV IVS:        1.00 cm      LV e' lateral:   7.07 cm/s LVOT diam:     2.00 cm      LV E/e' lateral: 12.6 LV SV:         63 LV SV Index:   32 LVOT Area:     3.14 cm  LV Volumes (MOD) LV vol d, MOD A2C: 77.3 ml LV vol d, MOD A4C: 103.0 ml LV vol s, MOD A2C: 35.8 ml LV vol s, MOD A4C: 44.3 ml LV SV MOD A2C:     41.5 ml LV SV MOD A4C:     103.0 ml LV SV MOD BP:      50.7 ml RIGHT VENTRICLE             IVC RV S prime:     12.00 cm/s  IVC diam: 1.40 cm TAPSE (M-mode): 2.1 cm LEFT ATRIUM         Index LA diam:    3.40 cm 1.72 cm/m  AORTIC VALVE AV Area (Vmax): 2.61 cm AV Vmax:        130.00 cm/s AV Peak Grad:   6.8 mmHg LVOT Vmax:      108.00 cm/s LVOT Vmean:     71.300 cm/s LVOT VTI:       0.202 m  AORTA Ao Root diam: 3.00 cm Ao Asc diam:  3.50 cm MITRAL VALVE               TRICUSPID VALVE MV Area (PHT): 3.87 cm    TR Peak grad:   19.2 mmHg MV Decel Time: 196 msec    TR Vmax:        219.00 cm/s MV E velocity: 88.80 cm/s MV A velocity: 93.60 cm/s  SHUNTS MV E/A ratio:  0.95        Systemic VTI:  0.20 m                             Systemic Diam: 2.00 cm Phineas Inches Electronically signed by Phineas Inches Signature Date/Time: 01/17/2021/4:48:45 PM    Final  Cardiac Studies   Echocardiogram 01/17/2021  1. Normal LV and RV function and size. EF 55-60%. No regional wall motion abnormalities. Grade I diastolic dysfunction (normal for age).   2. Pacemaker lead is visualized   3. Left atrium is moderately dilated. The right atrium is normal   4. No significant valvular abnormalities   5. The IVC is not dilated, RA pressure 3 mmHg   6. There is normal pulmonary artery systolic pressure. Comparison(s): No significant change from prior study.  Patient Profile     87 y.o. female with history of nonischemic cardiomyopathy and normalization of LVEF with good medical therapy and biventricular pacing (CRT hyperresponder) admitted with near syncope and AKI due to hypotension in the setting of COVID-19, transient brief atrial flutter.  Assessment & Plan    Hypotension: Improved, related to relative hypovolemia and active infection.  Diuretics and heart failure medications have been on hold. CHF: Echo continues to show excellent left ventricular systolic function following CRT.  Gradually reintroduce heart failure medications as blood pressure allows.  Resuming her beta-blocker is the most important initial intervention.  We will restart carvedilol at half her previous dose.  Continue to hold the Entresto and the spironolactone.  Continue to hold diuretics. AKI: Improved. COVID-19 infection: Inflammatory markers are also elevated, but with a general improving trend.     For questions or updates, please contact [place] Please consult [email or web address] for contact info under        Signed, Sanda Klein, MD  01/18/2021, 10:08 AM

## 2021-01-18 NOTE — Progress Notes (Signed)
PROGRESS NOTE  MELODY CIRRINCIONE KDX:[id number] DOB: Apr 02, 1932 DOA: 01/16/2021 PCP: Shirline Frees, MD  HPI/Recap of past 24 hours: Heather Smith is a 87 y.o. female with medical history significant of chronic diastolic CHF, history of HFrEF 30 to 35% status post PPM placement, hypertension, hyperlipidemia, hypothyroidism, nonischemic CAD who presented to [facility] ED with unwitnessed syncopal episode on the day of hospital visit.  Felt lightheaded prior to passing out.  Unclear how long she was unconscious.  EMS was activated.  Upon EMS arrival she was hypotensive and received IV fluid with improvement of her blood pressure.  She was noted to be febrile with T-max of 102.9.  Upon arrival to the ED she was found to be positive for COVID-19 viral infection, head CT and chest x-ray were nonacute.    Of note, patient's pacemaker was interrogated per EDP which was reviewed with cardiology over the phone.   She received IV fluid hydration and was started on antiviral Molnupiravir.  01/18/2021: Seen at bedside, weak appearing.  Appetite is poor.  Hypotensive, cardiac medications on hold.   Assessment/Plan: Principal Problem:   Syncope Active Problems:   HTN (hypertension)   Hyperlipemia   Chronic systolic heart failure (HCC)   Implantable cardioverter-defibrillator (ICD) in situ   Depression   Chronic diastolic congestive heart failure (HCC)   ARF (acute renal failure) (HCC)   Dehydration   COVID-19 virus infection  Unwitnessed syncope suspect secondary to orthostatic hypotension Positive orthostatic vital signs Cardiac medication held due to hypotension. Received IV fluid with improvement of blood pressure. Cardiology consulted and assisting with the management. Limited echo 01/17/2021 showed excellent left ventricular systolic function following CRT per cardiology.  COVID-19 viral infection Chest x-ray nonacute Continue antiviral Molpunavir Continue Vitamin D3, C, zinc. Continue  bronchodilators as needed Continue to maintain a saturation above 92% Incentive spirometer/flutter valve Mobilize as tolerated.  New diagnosis of atrial flutter seen on PPM post interrogation Follow TSH Optimize magnesium and potassium level Cardiology following  Worsening thrombocytopenia in the setting of COVID-19 viral infection Suspect related to COVID-19 viral infection Monitor for now Hemoglobin stable 12.3.  History of systolic CHF 97-67% post PPM placement Repeated 2D echo on 12/06/2020 with normal LVEF Cardiac medications on hold due to orthostatic hypotension. Strict I's and O's and Daily weight Management per cardiology  Improving AKI on CKD 3B suspect multifactorial with in the setting of dehydration and hypotension Presented with creatinine of 1.5. Creatinine is downtrending 1.13. Continue to avoid nephrotoxic agent, dehydration and hypotension Monitor urine output with strict I's and O's.  Hypothyroidism TSH normal Continue home levothyroxine  Hyperlipidemia Continue home Zetia  Chronic anxiety/depression Continue home Cymbalta  Physical debility OT assessed and recommended home health OT Continue PT OT with assistance and fall precautions.     Code Status: Full code  Family Communication: Daughter at bedside  Disposition Plan: Likely will discharge to home with home health services once cardiology signs off.   Consultants: Cardiology  Procedures: 2D echo on 01/17/2021.  Antimicrobials: Antiviral Molpunavir  DVT prophylaxis: Subcu Lovenox daily.  Status is: Inpatient  Inpatient status.  Patient requires at least 2 midnights for further evaluation and treatment of present condition.      Objective: Vitals:   01/17/21 1915 01/17/21 2109 01/17/21 2118 01/18/21 0434  BP:  124/70 109/71 (!) 113/50  Pulse:  (!) 101 70 (!) 58  Resp:  16 20 16   Temp: 98.5 F (36.9 C) 98.4 F (36.9 C) 99 F (37.2 C)  98.9 F (37.2 C)  TempSrc:  Oral  Oral Oral  SpO2:  97% 93% 93%  Weight:      Height:        Intake/Output Summary (Last 24 hours) at 01/18/2021 1234 Last data filed at 01/18/2021 8119 Gross per 24 hour  Intake 15.33 ml  Output --  Net 15.33 ml   Filed Weights   01/16/21 2152 01/17/21 0229  Weight: 86.1 kg 86.1 kg    Exam:  General: 87 y.o. year-old female weak appearing in no acute distress.  She is alert and oriented x3. Cardiovascular: Regular rate and rhythm no rubs or gallops.   Respiratory: Clear to auscultation no wheezes or rales.   Abdomen: Soft nontender normal bowel sounds present.   Musculoskeletal: No lower extremity edema bilaterally.  Skin: No ulcerative lesions noted.   Psychiatry: Mood is appropriate for condition and setting.   Data Reviewed: CBC: Recent Labs  Lab 01/16/21 1208 01/16/21 2240 01/17/21 0413 01/18/21 0421  WBC 5.6 5.7 4.9 4.6  HGB 12.4 12.6 12.3 13.4  HCT 37.0 38.1 37.3 38.8  MCV 93.4 95.3 94.9 91.9  PLT 140* 112* 112* 147*   Basic Metabolic Panel: Recent Labs  Lab 01/16/21 1208 01/16/21 2240 01/17/21 0413 01/17/21 1115 01/18/21 0421  NA 134*  --  139  --  135  K 4.3  --  4.4  --  4.2  CL 103  --  105  --  103  CO2 23  --  24  --  22  GLUCOSE 201*  --  115*  --  90  BUN 24*  --  24*  --  22  CREATININE 1.53* 1.41* 1.29*  --  1.10*  CALCIUM 9.0  --  8.4*  --  8.3*  MG  --   --   --  1.8  --    GFR: Estimated Creatinine Clearance: 41.4 mL/min (A) (by C-G formula based on SCr of 1.1 mg/dL (H)). Liver Function Tests: Recent Labs  Lab 01/17/21 0413  AST 20  ALT 14  ALKPHOS 49  BILITOT 0.7  PROT 6.6  ALBUMIN 3.9   No results for input(s): LIPASE, AMYLASE in the last 168 hours. No results for input(s): AMMONIA in the last 168 hours. Coagulation Profile: No results for input(s): INR, PROTIME in the last 168 hours. Cardiac Enzymes: No results for input(s): CKTOTAL, CKMB, CKMBINDEX, TROPONINI in the last 168 hours. BNP (last 3 results) No results  for input(s): PROBNP in the last 8760 hours. HbA1C: No results for input(s): HGBA1C in the last 72 hours. CBG: No results for input(s): GLUCAP in the last 168 hours. Lipid Profile: No results for input(s): CHOL, HDL, LDLCALC, TRIG, CHOLHDL, LDLDIRECT in the last 72 hours. Thyroid Function Tests: Recent Labs    01/17/21 1115  TSH 0.829   Anemia Panel: Recent Labs    01/16/21 2240 01/17/21 0413  FERRITIN 65 72   Urine analysis:    Component Value Date/Time   COLORURINE YELLOW 01/16/2021 1335   APPEARANCEUR HAZY (A) 01/16/2021 1335   LABSPEC >=1.030 01/16/2021 1335   PHURINE 5.0 01/16/2021 1335   GLUCOSEU NEGATIVE 01/16/2021 1335   HGBUR NEGATIVE 01/16/2021 1335   BILIRUBINUR NEGATIVE 01/16/2021 1335   KETONESUR TRACE (A) 01/16/2021 1335   PROTEINUR TRACE (A) 01/16/2021 1335   UROBILINOGEN 0.2 01/30/2014 1511   NITRITE NEGATIVE 01/16/2021 1335   LEUKOCYTESUR NEGATIVE 01/16/2021 1335   Sepsis Labs: @LABRCNTIP (procalcitonin:4,lacticidven:4)  ) Recent Results (from the past 240 hour(s))  Resp Panel by RT-PCR (Flu A&B, Covid) Nasopharyngeal Swab     Status: Abnormal   Collection Time: 01/16/21  1:38 PM   Specimen: Nasopharyngeal Swab; Nasopharyngeal(NP) swabs in vial transport medium  Result Value Ref Range Status   SARS Coronavirus 2 by RT PCR POSITIVE (A) NEGATIVE Final    Comment: (NOTE) SARS-CoV-2 target nucleic acids are DETECTED.  The SARS-CoV-2 RNA is generally detectable in upper respiratory specimens during the acute phase of infection. Positive results are indicative of the presence of the identified virus, but do not rule out bacterial infection or co-infection with other pathogens not detected by the test. Clinical correlation with patient history and other diagnostic information is necessary to determine patient infection status. The expected result is Negative.  Fact Sheet for Patients: EntrepreneurPulse.com.au  Fact Sheet for  Healthcare Providers: IncredibleEmployment.be  This test is not yet approved or cleared by the [place] FDA and  has been authorized for detection and/or diagnosis of SARS-CoV-2 by FDA under an Emergency Use Authorization (EUA).  This EUA will remain in effect (meaning this test can be used) for the duration of  the COVID-19 declaration under Section 564(b)(1) of the A ct, 21 U.S.C. section 360bbb-3(b)(1), unless the authorization is terminated or revoked sooner.     Influenza A by PCR NEGATIVE NEGATIVE Final   Influenza B by PCR NEGATIVE NEGATIVE Final    Comment: (NOTE) The Xpert Xpress SARS-CoV-2/FLU/RSV plus assay is intended as an aid in the diagnosis of influenza from Nasopharyngeal swab specimens and should not be used as a sole basis for treatment. Nasal washings and aspirates are unacceptable for Xpert Xpress SARS-CoV-2/FLU/RSV testing.  Fact Sheet for Patients: EntrepreneurPulse.com.au  Fact Sheet for Healthcare Providers: IncredibleEmployment.be  This test is not yet approved or cleared by the [place] FDA and has been authorized for detection and/or diagnosis of SARS-CoV-2 by FDA under an Emergency Use Authorization (EUA). This EUA will remain in effect (meaning this test can be used) for the duration of the COVID-19 declaration under Section 564(b)(1) of the Act, 21 U.S.C. section 360bbb-3(b)(1), unless the authorization is terminated or revoked.  Performed at [facility], [place] [address], [place], [place] [address]       Studies: ECHOCARDIOGRAM LIMITED  Result Date: 01/17/2021    ECHOCARDIOGRAM LIMITED REPORT   Patient Name:   TORIANNA JUNIO Date of Exam: 01/17/2021 Medical Rec #:  [id number]        Height:       67.0 in Accession #:    [id number]       Weight:       189.9 lb Date of Birth:  [birth year]        BSA:          1.978 m Patient Age:    21 years         BP:            114/56 mmHg Patient Gender: F                HR:           69 bpm. Exam Location:  Inpatient Procedure: 2D Echo, Limited Echo, Cardiac Doppler and Color Doppler Indications:    Syncope  History:        Patient has prior history of Echocardiogram examinations. CHF;                 Risk Factors:Hypertension.  Sonographer:    Jyl Heinz Referring Phys: [phone]  [place]  1. Normal LV and RV function and size. EF 55-60%. No regional wall motion abnormalities. Grade I diastolic dysfunction (normal for age).  2. Pacemaker lead is visualized  3. Left atrium is moderately dilated. The right atrium is normal  4. No significant valvular abnormalities  5. The IVC is not dilated, RA pressure 3 mmHg  6. There is normal pulmonary artery systolic pressure. Comparison(s): No significant change from prior study. FINDINGS  Left Ventricle: Normal LV and RV function and size. EF 55-60%. No regional wall motion abnormalities. Grade I diastolic dysfunction (normal for age). Right Ventricle: There is normal pulmonary artery systolic pressure. The tricuspid regurgitant velocity is 2.19 m/s, and with an assumed right atrial pressure of 3 mmHg, the estimated right ventricular systolic pressure is 77.4 mmHg. Aortic Valve: Aortic valve peak gradient measures 6.8 mmHg. LEFT VENTRICLE PLAX 2D LVIDd:         4.60 cm      Diastology LVIDs:         3.30 cm      LV e' medial:    5.98 cm/s LV PW:         1.10 cm      LV E/e' medial:  14.8 LV IVS:        1.00 cm      LV e' lateral:   7.07 cm/s LVOT diam:     2.00 cm      LV E/e' lateral: 12.6 LV SV:         63 LV SV Index:   32 LVOT Area:     3.14 cm  LV Volumes (MOD) LV vol d, MOD A2C: 77.3 ml LV vol d, MOD A4C: 103.0 ml LV vol s, MOD A2C: 35.8 ml LV vol s, MOD A4C: 44.3 ml LV SV MOD A2C:     41.5 ml LV SV MOD A4C:     103.0 ml LV SV MOD BP:      50.7 ml RIGHT VENTRICLE             IVC RV S prime:     12.00 cm/s  IVC diam: 1.40 cm TAPSE (M-mode): 2.1 cm LEFT ATRIUM         Index LA  diam:    3.40 cm 1.72 cm/m  AORTIC VALVE AV Area (Vmax): 2.61 cm AV Vmax:        130.00 cm/s AV Peak Grad:   6.8 mmHg LVOT Vmax:      108.00 cm/s LVOT Vmean:     71.300 cm/s LVOT VTI:       0.202 m  AORTA Ao Root diam: 3.00 cm Ao Asc diam:  3.50 cm MITRAL VALVE               TRICUSPID VALVE MV Area (PHT): 3.87 cm    TR Peak grad:   19.2 mmHg MV Decel Time: 196 msec    TR Vmax:        219.00 cm/s MV E velocity: 88.80 cm/s MV A velocity: 93.60 cm/s  SHUNTS MV E/A ratio:  0.95        Systemic VTI:  0.20 m                            Systemic Diam: 2.00 cm Phineas Inches Electronically signed by Phineas Inches Signature Date/Time: 01/17/2021/4:48:45 PM    Final     Scheduled Meds:  albuterol  2 puff  Inhalation BID   vitamin C  1,000 mg Oral Daily   carvedilol  3.125 mg Oral BID WC   cholecalciferol  1,000 Units Oral Daily   DULoxetine  30 mg Oral Daily   enoxaparin (LOVENOX) injection  40 mg Subcutaneous Q24H   ezetimibe  10 mg Oral Daily   flecainide  50 mg Oral BID   levothyroxine  50 mcg Oral Daily   molnupiravir EUA  4 capsule Oral BID   zinc sulfate  220 mg Oral Daily    Continuous Infusions:  lactated ringers 50 mL/hr at 01/18/21 0609     LOS: 1 day     Kayleen Memos, MD [facility] Pager [phone]  If 7PM-7AM, please contact night-coverage [email or web address] Password [facility] 01/18/2021, 12:34 PM

## 2021-01-19 DIAGNOSIS — E860 Dehydration: Secondary | ICD-10-CM

## 2021-01-19 DIAGNOSIS — Z95810 Presence of automatic (implantable) cardiac defibrillator: Secondary | ICD-10-CM

## 2021-01-19 DIAGNOSIS — N179 Acute kidney failure, unspecified: Secondary | ICD-10-CM

## 2021-01-19 LAB — BASIC METABOLIC PANEL
Anion gap: 10 (ref 5–15)
BUN: 22 mg/dL (ref 8–23)
CO2: 23 mmol/L (ref 22–32)
Calcium: 8.7 mg/dL — ABNORMAL LOW (ref 8.9–10.3)
Chloride: 106 mmol/L (ref 98–111)
Creatinine, Ser: 1.07 mg/dL — ABNORMAL HIGH (ref 0.44–1.00)
GFR, Estimated: 51 mL/min — ABNORMAL LOW (ref >60)
Glucose, Bld: 99 mg/dL (ref 70–99)
Potassium: 3.6 mmol/L (ref 3.5–5.1)
Sodium: 139 mmol/L (ref 135–145)

## 2021-01-19 LAB — PHOSPHORUS: Phosphorus: 3.5 mg/dL (ref 2.5–4.6)

## 2021-01-19 LAB — MAGNESIUM: Magnesium: 1.9 mg/dL (ref 1.7–2.4)

## 2021-01-19 LAB — FERRITIN: Ferritin: 112 ng/mL (ref 11–307)

## 2021-01-19 LAB — C-REACTIVE PROTEIN: CRP: 1.3 mg/dL — ABNORMAL HIGH (ref <1.0)

## 2021-01-19 LAB — D-DIMER, QUANTITATIVE: D-Dimer, Quant: 1.47 ug/mL-FEU — ABNORMAL HIGH (ref 0.00–0.50)

## 2021-01-19 MED ORDER — CARVEDILOL 6.25 MG PO TABS
6.2500 mg | ORAL_TABLET | Freq: Two times a day (BID) | ORAL | Status: DC
  Administered 2021-01-19 – 2021-01-20 (×2): 6.25 mg via ORAL
  Filled 2021-01-19 (×2): qty 1

## 2021-01-19 NOTE — Progress Notes (Signed)
Progress Note  Patient Name: Heather Smith Date of Encounter: 01/19/2021  [facility] Cardiologist:  Mclean/Klein  Subjective   Feels much better. Episodes of SVT and frequency of PVCs diminished on telemetry.  Inpatient Medications    Scheduled Meds:  albuterol  2 puff Inhalation BID   vitamin C  1,000 mg Oral Daily   carvedilol  3.125 mg Oral BID WC   cholecalciferol  1,000 Units Oral Daily   DULoxetine  30 mg Oral Daily   enoxaparin (LOVENOX) injection  40 mg Subcutaneous Q24H   ezetimibe  10 mg Oral Daily   flecainide  50 mg Oral BID   levothyroxine  50 mcg Oral Daily   molnupiravir EUA  4 capsule Oral BID   zinc sulfate  220 mg Oral Daily   Continuous Infusions:  PRN Meds: acetaminophen, albuterol, chlorpheniramine-HYDROcodone, guaiFENesin-dextromethorphan   Vital Signs    Vitals:   01/18/21 1657 01/18/21 2040 01/19/21 0500 01/19/21 0558  BP: 105/63 110/61  113/66  Pulse:  66  63  Resp:  16  16  Temp:  98.3 F (36.8 C)  98.2 F (36.8 C)  TempSrc:  Oral  Oral  SpO2:  95%  96%  Weight:   86 kg   Height:        Intake/Output Summary (Last 24 hours) at 01/19/2021 0921 Last data filed at 01/18/2021 1900 Gross per 24 hour  Intake 828.79 ml  Output --  Net 828.79 ml   Last 3 Weights 01/19/2021 01/17/2021 01/16/2021  Weight (lbs) 189 lb 9.5 oz 189 lb 14.4 oz 189 lb 14.4 oz  Weight (kg) 86 kg 86.138 kg 86.138 kg      Telemetry    Mostly atrial sensed (sinus), ventricular paced rhythm with occasional PVCs and rare ventricular couplets.  Occasional atrial paced, ventricular paced rhythm.  No lengthy episodes of ventricular sensed rhythm are seen.  Overall burden of ectopy substantially reduced.- Personally Reviewed  ECG    No new tracings- Personally Reviewed  Physical Exam  Appears well GEN: No acute distress.   Neck: No JVD Cardiac: RRR, occ ectopy, healthy ICD site, no murmurs, rubs, or gallops.  Respiratory: Clear to auscultation  bilaterally. GI: Soft, nontender, non-distended  MS: No edema; No deformity. Neuro:  Nonfocal  Psych: Normal affect   Labs    High Sensitivity Troponin:   Recent Labs  Lab 01/16/21 1208 01/16/21 1406  TROPONINIHS 8 8     Chemistry Recent Labs  Lab 01/17/21 0413 01/17/21 1115 01/18/21 0421 01/19/21 0407  NA 139  --  135 139  K 4.4  --  4.2 3.6  CL 105  --  103 106  CO2 24  --  22 23  GLUCOSE 115*  --  90 99  BUN 24*  --  22 22  CREATININE 1.29*  --  1.10* 1.07*  CALCIUM 8.4*  --  8.3* 8.7*  MG  --  1.8  --  1.9  PROT 6.6  --   --   --   ALBUMIN 3.9  --   --   --   AST 20  --   --   --   ALT 14  --   --   --   ALKPHOS 49  --   --   --   BILITOT 0.7  --   --   --   GFRNONAA 40*  --  49* 51*  ANIONGAP 10  --  10 10  Lipids No results for input(s): CHOL, TRIG, HDL, LABVLDL, LDLCALC, CHOLHDL in the last 168 hours.  Hematology Recent Labs  Lab 01/16/21 2240 01/17/21 0413 01/18/21 0421  WBC 5.7 4.9 4.6  RBC 4.00 3.93 4.22  HGB 12.6 12.3 13.4  HCT 38.1 37.3 38.8  MCV 95.3 94.9 91.9  MCH 31.5 31.3 31.8  MCHC 33.1 33.0 34.5  RDW 13.4 13.4 13.4  PLT 112* 112* 117*   Thyroid  Recent Labs  Lab 01/17/21 1115  TSH 0.829    BNP Recent Labs  Lab 01/16/21 2240  BNP 221.9*    DDimer  Recent Labs  Lab 01/17/21 0413 01/18/21 0421 01/19/21 0407  DDIMER 3.40* 2.56* 1.47*     Radiology    ECHOCARDIOGRAM LIMITED  Result Date: 01/17/2021    ECHOCARDIOGRAM LIMITED REPORT   Patient Name:   Heather Smith Date of Exam: 01/17/2021 Medical Rec #:  [id number]        Height:       67.0 in Accession #:    [id number]       Weight:       189.9 lb Date of Birth:  10/04/1932        BSA:          1.978 m Patient Age:    87 years         BP:           114/56 mmHg Patient Gender: F                HR:           69 bpm. Exam Location:  Inpatient Procedure: 2D Echo, Limited Echo, Cardiac Doppler and Color Doppler Indications:    Syncope  History:        Patient has prior  history of Echocardiogram examinations. CHF;                 Risk Factors:Hypertension.  Sonographer:    Jyl Heinz Referring Phys: [place]  1. Normal LV and RV function and size. EF 55-60%. No regional wall motion abnormalities. Grade I diastolic dysfunction (normal for age).  2. Pacemaker lead is visualized  3. Left atrium is moderately dilated. The right atrium is normal  4. No significant valvular abnormalities  5. The IVC is not dilated, RA pressure 3 mmHg  6. There is normal pulmonary artery systolic pressure. Comparison(s): No significant change from prior study. FINDINGS  Left Ventricle: Normal LV and RV function and size. EF 55-60%. No regional wall motion abnormalities. Grade I diastolic dysfunction (normal for age). Right Ventricle: There is normal pulmonary artery systolic pressure. The tricuspid regurgitant velocity is 2.19 m/s, and with an assumed right atrial pressure of 3 mmHg, the estimated right ventricular systolic pressure is 00.9 mmHg. Aortic Valve: Aortic valve peak gradient measures 6.8 mmHg. LEFT VENTRICLE PLAX 2D LVIDd:         4.60 cm      Diastology LVIDs:         3.30 cm      LV e' medial:    5.98 cm/s LV PW:         1.10 cm      LV E/e' medial:  14.8 LV IVS:        1.00 cm      LV e' lateral:   7.07 cm/s LVOT diam:     2.00 cm      LV E/e' lateral: 12.6 LV SV:  63 LV SV Index:   32 LVOT Area:     3.14 cm  LV Volumes (MOD) LV vol d, MOD A2C: 77.3 ml LV vol d, MOD A4C: 103.0 ml LV vol s, MOD A2C: 35.8 ml LV vol s, MOD A4C: 44.3 ml LV SV MOD A2C:     41.5 ml LV SV MOD A4C:     103.0 ml LV SV MOD BP:      50.7 ml RIGHT VENTRICLE             IVC RV S prime:     12.00 cm/s  IVC diam: 1.40 cm TAPSE (M-mode): 2.1 cm LEFT ATRIUM         Index LA diam:    3.40 cm 1.72 cm/m  AORTIC VALVE AV Area (Vmax): 2.61 cm AV Vmax:        130.00 cm/s AV Peak Grad:   6.8 mmHg LVOT Vmax:      108.00 cm/s LVOT Vmean:     71.300 cm/s LVOT VTI:       0.202 m  AORTA Ao Root diam:  3.00 cm Ao Asc diam:  3.50 cm MITRAL VALVE               TRICUSPID VALVE MV Area (PHT): 3.87 cm    TR Peak grad:   19.2 mmHg MV Decel Time: 196 msec    TR Vmax:        219.00 cm/s MV E velocity: 88.80 cm/s MV A velocity: 93.60 cm/s  SHUNTS MV E/A ratio:  0.95        Systemic VTI:  0.20 m                            Systemic Diam: 2.00 cm Phineas Inches Electronically signed by Phineas Inches Signature Date/Time: 01/17/2021/4:48:45 PM    Final     Cardiac Studies   Echocardiogram 01/17/2021   1. Normal LV and RV function and size. EF 55-60%. No regional wall motion abnormalities. Grade I diastolic dysfunction (normal for age).   2. Pacemaker lead is visualized   3. Left atrium is moderately dilated. The right atrium is normal   4. No significant valvular abnormalities   5. The IVC is not dilated, RA pressure 3 mmHg   6. There is normal pulmonary artery systolic pressure. Comparison(s): No significant change from prior study.    Patient Profile     87 y.o. female with history of nonischemic cardiomyopathy and normalization of LVEF with good medical therapy and biventricular pacing (CRT hyperresponder) admitted with near syncope and AKI due to hypotension in the setting of COVID-19, transient brief atrial flutter.   Assessment & Plan    Hypotension: Resolved.  We will start to gradually reintroduce heart failure medications. Increase carvedilol to usual dose today.  CHF: No evidence of decompensation.  Weights today is identical to admission weight and close to her usual home weight of 185-190 lb.  No clear evidence of hypervolemia.  Echo continues to show excellent left ventricular systolic function following CRT.  Gradually reintroduce heart failure medications as blood pressure allows.  We will increase the carvedilol to her usual dose and plan to add back Entresto and spironolactone tomorrow. She only uses diuretics at home as needed, not daily. AKI: Improved.  In fact, renal parameters appear to  be slightly better than baseline from earlier this year when creatinine was around 1.15-1.20. COVID-19 infection: All disease markers appear  to be showing a significant improvement with CRP down to 1.3 and D-dimer down to 1.5      [facility] will sign off.   Medication Recommendations:  - Flecainide 50 mg twice daily  - Carvedilol 6.25 mg twice daily starting today - Entresto 24-26 mg twice daily and spironolactone 25 mg daily starting tomorrow - Furosemide 20 mg daily "prn" (as needed for fluid gain) Other recommendations (labs, testing, etc): Recheck a basic metabolic panel in 7 to 14 days Follow up as an outpatient: We will schedule a follow-up in 1 month with CHF clinic APP.   For questions or updates, please contact [place] Please consult [email or web address] for contact info under        Signed, Sanda Klein, MD  01/19/2021, 9:21 AM

## 2021-01-19 NOTE — Progress Notes (Signed)
PROGRESS NOTE  Heather Smith YYT:[id number] DOB: [birth year] DOA: 01/16/2021 PCP: Shirline Frees, MD  HPI/Recap of past 24 hours: Heather Smith is a 87 y.o. female with medical history significant of chronic diastolic CHF, history of HFrEF 30 to 35% status post PPM placement, hypertension, hyperlipidemia, hypothyroidism, nonischemic CAD who presented to [facility] ED with unwitnessed syncopal episode on the day of hospital visit.  Felt lightheaded prior to passing out.  Unclear how long she was unconscious.  EMS was activated.  Upon EMS arrival she was hypotensive and received IV fluid with improvement of her blood pressure.  She was noted to be febrile with T-max of 102.9.  Upon arrival to the ED she was found to be positive for COVID-19 viral infection, head CT and chest x-ray were nonacute.    Of note, patient's pacemaker was interrogated per EDP which was reviewed with cardiology over the phone.   She received IV fluid hydration and was started on antiviral Molnupiravir.  01/19/2021: Generalized weakness is main concern.  Restarted home dose Coreg at lowest dose due to soft BPs.   Assessment/Plan: Principal Problem:   Syncope Active Problems:   HTN (hypertension)   Hyperlipemia   Chronic systolic heart failure (HCC)   Implantable cardioverter-defibrillator (ICD) in situ   Depression   Chronic diastolic congestive heart failure (HCC)   ARF (acute renal failure) (HCC)   Dehydration   COVID-19 virus infection  Unwitnessed syncope suspect secondary to orthostatic hypotension Positive orthostatic vital signs Cardiac medication held due to hypotension. Received IV fluid with improvement of blood pressure. Cardiology consulted and assisting with the management. Limited echo 01/17/2021 showed excellent left ventricular systolic function following CRT per cardiology. Coreg started on 01/19/2021 at 3.125 mg twice daily. Appreciate cardiology's assistance  COVID-19 viral  infection Chest x-ray nonacute Continue antiviral Molpunavir Continue Vitamin D3, C, zinc. Continue bronchodilators as needed Continue to maintain a saturation above 92% Incentive spirometer/flutter valve Mobilize as tolerated.  Essential hypertension, BPs are soft Coreg 3.125 mg twice daily restarted on 01/19/2021 Continue to closely monitor vital signs  New diagnosis of atrial flutter seen on PPM post interrogation TSH is normal 0.829 on 01/17/2021 Optimize magnesium and potassium level Cardiology following  Worsening thrombocytopenia in the setting of COVID-19 viral infection Suspect related to COVID-19 viral infection Monitor for now Hemoglobin stable 12.3.  History of systolic CHF 27-51% post PPM placement Repeated 2D echo on 12/06/2020 with normal LVEF Cardiac medications on hold due to orthostatic hypotension. Strict I's and O's and Daily weight Management per cardiology  Improving AKI on CKD 3B suspect multifactorial with in the setting of dehydration and hypotension Presented with creatinine of 1.5. Creatinine is downtrending 1.13. Continue to avoid nephrotoxic agent, dehydration and hypotension Monitor urine output with strict I's and O's.  Hypothyroidism TSH normal Continue home levothyroxine  Hyperlipidemia Continue home Zetia  Chronic anxiety/depression Continue home Cymbalta  Physical debility OT assessed and recommended home health OT Continue PT OT with assistance and fall precautions.     Code Status: Full code  Family Communication: Daughter at bedside  Disposition Plan: Likely will discharge to home with home health services once cardiology signs off.   Consultants: Cardiology  Procedures: 2D echo on 01/17/2021.  Antimicrobials: Antiviral Molpunavir  DVT prophylaxis: Subcu Lovenox daily.  Status is: Inpatient  Inpatient status.  Patient requires at least 2 midnights for further evaluation and treatment of present  condition.      Objective: Vitals:   01/19/21 0500 01/19/21 7001  01/19/21 1016 01/19/21 1614  BP:  113/66 (!) 114/91 119/82  Pulse:  63 66 69  Resp:  16 18   Temp:  98.2 F (36.8 C) 98.2 F (36.8 C)   TempSrc:  Oral Oral   SpO2:  96% 99%   Weight: 86 kg     Height:        Intake/Output Summary (Last 24 hours) at 01/19/2021 1634 Last data filed at 01/19/2021 1300 Gross per 24 hour  Intake 580 ml  Output --  Net 580 ml   Filed Weights   01/16/21 2152 01/17/21 0229 01/19/21 0500  Weight: 86.1 kg 86.1 kg 86 kg    Exam:  General: 87 y.o. year-old female weak appearing in no acute distress.  She is alert and oriented x3.  Cardiovascular: Regular rate and rhythm no rubs or gallops.   Respiratory: Clear to auscultation with no wheezes or rales.   Abdomen: Soft nontender normal bowel sounds present.   Musculoskeletal: No lower extremity edema bilaterally.  Skin: No ulcerative lesions noted. Psychiatry: Mood is appropriate for condition and setting.   Data Reviewed: CBC: Recent Labs  Lab 01/16/21 1208 01/16/21 2240 01/17/21 0413 01/18/21 0421  WBC 5.6 5.7 4.9 4.6  HGB 12.4 12.6 12.3 13.4  HCT 37.0 38.1 37.3 38.8  MCV 93.4 95.3 94.9 91.9  PLT 140* 112* 112* 932*   Basic Metabolic Panel: Recent Labs  Lab 01/16/21 1208 01/16/21 2240 01/17/21 0413 01/17/21 1115 01/18/21 0421 01/19/21 0407  NA 134*  --  139  --  135 139  K 4.3  --  4.4  --  4.2 3.6  CL 103  --  105  --  103 106  CO2 23  --  24  --  22 23  GLUCOSE 201*  --  115*  --  90 99  BUN 24*  --  24*  --  22 22  CREATININE 1.53* 1.41* 1.29*  --  1.10* 1.07*  CALCIUM 9.0  --  8.4*  --  8.3* 8.7*  MG  --   --   --  1.8  --  1.9  PHOS  --   --   --   --   --  3.5   GFR: Estimated Creatinine Clearance: 42.5 mL/min (A) (by C-G formula based on SCr of 1.07 mg/dL (H)). Liver Function Tests: Recent Labs  Lab 01/17/21 0413  AST 20  ALT 14  ALKPHOS 49  BILITOT 0.7  PROT 6.6  ALBUMIN 3.9   No  results for input(s): LIPASE, AMYLASE in the last 168 hours. No results for input(s): AMMONIA in the last 168 hours. Coagulation Profile: No results for input(s): INR, PROTIME in the last 168 hours. Cardiac Enzymes: No results for input(s): CKTOTAL, CKMB, CKMBINDEX, TROPONINI in the last 168 hours. BNP (last 3 results) No results for input(s): PROBNP in the last 8760 hours. HbA1C: No results for input(s): HGBA1C in the last 72 hours. CBG: No results for input(s): GLUCAP in the last 168 hours. Lipid Profile: No results for input(s): CHOL, HDL, LDLCALC, TRIG, CHOLHDL, LDLDIRECT in the last 72 hours. Thyroid Function Tests: Recent Labs    01/17/21 1115  TSH 0.829   Anemia Panel: Recent Labs    01/18/21 0421 01/19/21 0407  FERRITIN 166 112   Urine analysis:    Component Value Date/Time   COLORURINE YELLOW 01/16/2021 1335   APPEARANCEUR HAZY (A) 01/16/2021 1335   LABSPEC >=1.030 01/16/2021 1335   PHURINE 5.0 01/16/2021 1335  GLUCOSEU NEGATIVE 01/16/2021 [place] 01/16/2021 1335   BILIRUBINUR NEGATIVE 01/16/2021 1335   KETONESUR TRACE (A) 01/16/2021 1335   PROTEINUR TRACE (A) 01/16/2021 1335   UROBILINOGEN 0.2 01/30/2014 1511   NITRITE NEGATIVE 01/16/2021 1335   LEUKOCYTESUR NEGATIVE 01/16/2021 1335   Sepsis Labs: @LABRCNTIP (procalcitonin:4,lacticidven:4)  ) Recent Results (from the past 240 hour(s))  Resp Panel by RT-PCR (Flu A&B, Covid) Nasopharyngeal Swab     Status: Abnormal   Collection Time: 01/16/21  1:38 PM   Specimen: Nasopharyngeal Swab; Nasopharyngeal(NP) swabs in vial transport medium  Result Value Ref Range Status   SARS Coronavirus 2 by RT PCR POSITIVE (A) NEGATIVE Final    Comment: (NOTE) SARS-CoV-2 target nucleic acids are DETECTED.  The SARS-CoV-2 RNA is generally detectable in upper respiratory specimens during the acute phase of infection. Positive results are indicative of the presence of the identified virus, but do not rule out  bacterial infection or co-infection with other pathogens not detected by the test. Clinical correlation with patient history and other diagnostic information is necessary to determine patient infection status. The expected result is Negative.  Fact Sheet for Patients: EntrepreneurPulse.com.au  Fact Sheet for Healthcare Providers: IncredibleEmployment.be  This test is not yet approved or cleared by the [place] FDA and  has been authorized for detection and/or diagnosis of SARS-CoV-2 by FDA under an Emergency Use Authorization (EUA).  This EUA will remain in effect (meaning this test can be used) for the duration of  the COVID-19 declaration under Section 564(b)(1) of the A ct, 21 U.S.C. section 360bbb-3(b)(1), unless the authorization is terminated or revoked sooner.     Influenza A by PCR NEGATIVE NEGATIVE Final   Influenza B by PCR NEGATIVE NEGATIVE Final    Comment: (NOTE) The Xpert Xpress SARS-CoV-2/FLU/RSV plus assay is intended as an aid in the diagnosis of influenza from Nasopharyngeal swab specimens and should not be used as a sole basis for treatment. Nasal washings and aspirates are unacceptable for Xpert Xpress SARS-CoV-2/FLU/RSV testing.  Fact Sheet for Patients: EntrepreneurPulse.com.au  Fact Sheet for Healthcare Providers: IncredibleEmployment.be  This test is not yet approved or cleared by the [place] FDA and has been authorized for detection and/or diagnosis of SARS-CoV-2 by FDA under an Emergency Use Authorization (EUA). This EUA will remain in effect (meaning this test can be used) for the duration of the COVID-19 declaration under Section 564(b)(1) of the Act, 21 U.S.C. section 360bbb-3(b)(1), unless the authorization is terminated or revoked.  Performed at [facility], [place] [address], [place], [place] [address]       Studies: No results  found.  Scheduled Meds:  albuterol  2 puff Inhalation BID   vitamin C  1,000 mg Oral Daily   carvedilol  6.25 mg Oral BID WC   cholecalciferol  1,000 Units Oral Daily   DULoxetine  30 mg Oral Daily   enoxaparin (LOVENOX) injection  40 mg Subcutaneous Q24H   ezetimibe  10 mg Oral Daily   flecainide  50 mg Oral BID   levothyroxine  50 mcg Oral Daily   molnupiravir EUA  4 capsule Oral BID   zinc sulfate  220 mg Oral Daily    Continuous Infusions:     LOS: 2 days     Kayleen Memos, MD [facility] Pager [phone]  If 7PM-7AM, please contact night-coverage [email or web address] [facility] 01/19/2021, 4:34 PM

## 2021-01-19 NOTE — Plan of Care (Signed)

## 2021-01-20 ENCOUNTER — Telehealth (HOSPITAL_COMMUNITY): Payer: Self-pay | Admitting: Family Medicine

## 2021-01-20 ENCOUNTER — Other Ambulatory Visit (HOSPITAL_COMMUNITY): Payer: Self-pay

## 2021-01-20 DIAGNOSIS — R55 Syncope and collapse: Secondary | ICD-10-CM | POA: Diagnosis not present

## 2021-01-20 LAB — D-DIMER, QUANTITATIVE: D-Dimer, Quant: 1.28 ug/mL-FEU — ABNORMAL HIGH (ref 0.00–0.50)

## 2021-01-20 LAB — C-REACTIVE PROTEIN: CRP: 0.8 mg/dL (ref <1.0)

## 2021-01-20 LAB — FERRITIN: Ferritin: 115 ng/mL (ref 11–307)

## 2021-01-20 MED ORDER — GABAPENTIN 300 MG PO CAPS
300.0000 mg | ORAL_CAPSULE | Freq: Once | ORAL | Status: AC
  Administered 2021-01-20: 01:00:00 300 mg via ORAL
  Filled 2021-01-20: qty 1

## 2021-01-20 MED ORDER — CARVEDILOL 6.25 MG PO TABS
6.2500 mg | ORAL_TABLET | Freq: Two times a day (BID) | ORAL | 0 refills | Status: DC
  Filled 2021-01-20: qty 60, 30d supply, fill #0

## 2021-01-20 NOTE — Progress Notes (Signed)
Pt discharged with clothes, phone, take home meds, and charger.  Her daughter was present for discharge summary.  Pt refused home health PT/OT.  Pt was taken to daughter's car by wheelchair at front entrance.

## 2021-01-20 NOTE — Discharge Summary (Signed)
Discharge Summary  Heather Smith GYK:[id number] DOB: July 16, 1932  PCP: Heather Frees, MD  Admit date: 01/16/2021 Discharge date: 01/20/2021  Time spent: 35 minutes  Recommendations for Outpatient Follow-up:  Follow-up with cardiology  Cardiologist recommendations: [facility]   Medication Recommendations:  - Flecainide 50 mg twice daily  - Carvedilol 6.25 mg twice daily starting today - Entresto 24-26 mg twice daily and spironolactone 25 mg daily starting tomorrow - Furosemide 20 mg daily "prn" (as needed for fluid gain) Other recommendations (labs, testing, etc): Recheck a basic metabolic panel in 7 to 14 days Follow up as an outpatient: We will schedule a follow-up in 1 month with CHF clinic APP.  Discharge Diagnoses:  Active Hospital Problems   Diagnosis Date Noted   Syncope 01/16/2021   ARF (acute renal failure) ([place]) 01/16/2021   Dehydration 01/16/2021   COVID-19 virus infection 01/16/2021   Chronic diastolic congestive heart failure ([place])    Depression 06/06/2013   Implantable cardioverter-defibrillator (ICD) in situ 79/39/0300   Chronic systolic heart failure ([place]) 12/15/2012   HTN (hypertension) 08/22/2010   Hyperlipemia 08/22/2010    Resolved Hospital Problems  No resolved problems to display.    Discharge Condition: Stable  Diet recommendation: Resume previous diet  Vitals:   01/19/21 2015 01/20/21 0830  BP: 121/66 133/72  Pulse: (!) 59 66  Resp: 16   Temp: 98.2 F (36.8 C)   SpO2: 98%     History of present illness:   Heather Smith is a 87 y.o. female with medical history significant of chronic diastolic CHF, history of HFrEF 30 to 35% status post PPM placement, hypertension, hyperlipidemia, hypothyroidism, nonischemic CAD who presented to [facility] ED with unwitnessed syncopal episode on the day of hospital visit.  Felt lightheaded prior to passing out.  Unclear how long she was unconscious.  EMS was activated.  Upon EMS arrival she was  hypotensive and received IV fluid with improvement of her blood pressure.  She was noted to be febrile with T-max of 102.9.  Upon arrival to the ED she was found to be positive for COVID-19 viral infection, head CT and chest x-ray were nonacute.     Of note, patient's pacemaker was interrogated per EDP which was reviewed with cardiology over the phone.   She received IV fluid hydration and was started on antiviral Molnupiravir.   01/20/2021: She feels better today.  Has no new complaints.  She is eager to go home.  Hospital Course:  Principal Problem:   Syncope Active Problems:   HTN (hypertension)   Hyperlipemia   Chronic systolic heart failure ([facility])   Implantable cardioverter-defibrillator (ICD) in situ   Depression   Chronic diastolic congestive heart failure ([facility])   ARF (acute renal failure) ([facility])   Dehydration   COVID-19 virus infection  Unwitnessed syncope suspect secondary to orthostatic hypotension Positive orthostatic vital signs Cardiac medication initially held due to hypotension. Received IV fluid with improvement of blood pressure. Limited echo 01/17/2021 showed excellent left ventricular systolic function following CRT per cardiology. Seen by cardiology   COVID-19 viral infection Chest x-ray nonacute Completed antiviral Molpunavir Continue Vitamin D3, C, zinc. Continue bronchodilators as needed   Essential hypertension Cardiac medications as recommended by cardiology, as stated above.   New diagnosis of atrial flutter seen on PPM post interrogation TSH is normal 0.829 on 01/17/2021   Improving thrombocytopenia in the setting of COVID-19 viral infection Suspect related to COVID-19 viral infection Platelet count 117,000.   History of systolic CHF 92-33%  post PPM placement Repeated 2D echo on 12/06/2020 with normal LVEF  Nonoliguric AKI on CKD 3B suspect multifactorial with in the setting of dehydration and hypotension Presented with creatinine of  1.5. Creatinine down trended 1.07 from 1.13. Continue to avoid nephrotoxic agent, dehydration and hypotension   Hypothyroidism TSH normal Continue home levothyroxine   Hyperlipidemia Continue home Zetia   Chronic anxiety/depression Continue home Cymbalta   Physical debility OT assessed and recommended home health OT Continue PT OT with assistance and fall precautions.        Code Status: Full code    Consultants: Cardiology   Procedures: 2D echo on 01/17/2021.   Antimicrobials: Antiviral Molpunavir     Discharge Exam: BP 133/72    Pulse 66    Temp 98.2 F (36.8 C) (Oral)    Resp 16    Ht 5\' 7"  (1.702 m)    Wt 85.7 kg    SpO2 98%    BMI 29.59 kg/m  General: 87 y.o. year-old female well developed well nourished in no acute distress.  Alert and oriented x3. Cardiovascular: Regular rate and rhythm with no rubs or gallops.  No thyromegaly or JVD noted.   Respiratory: Clear to auscultation with no wheezes or rales. Good inspiratory effort. Abdomen: Soft nontender nondistended with normal bowel sounds x4 quadrants. Musculoskeletal: No lower extremity edema. 2/4 pulses in all 4 extremities. Skin: No ulcerative lesions noted or rashes, Psychiatry: Mood is appropriate for condition and setting  Discharge Instructions You were cared for by a hospitalist during your hospital stay. If you have any questions about your discharge medications or the care you received while you were in the hospital after you are discharged, you can call the unit and asked to speak with the hospitalist on call if the hospitalist that took care of you is not available. Once you are discharged, your primary care physician will handle any further medical issues. Please note that NO REFILLS for any discharge medications will be authorized once you are discharged, as it is imperative that you return to your primary care physician (or establish a relationship with a primary care physician if you do not have  one) for your aftercare needs so that they can reassess your need for medications and monitor your lab values.   Allergies as of 01/20/2021       Reactions   Statins Other (See Comments)   "Makes her bones hurt and very sore"   Niaspan [niacin Er] Other (See Comments)   High doses, causes hot flashes & aching    Simvastatin Other (See Comments)   Benadryl [diphenhydramine] Other (See Comments)   Makes pt feel jittery    Dilaudid [hydromorphone Hcl] Itching        Medication List     TAKE these medications    acetaminophen 325 MG tablet Commonly known as: TYLENOL Take 325 mg by mouth every 6 (six) hours as needed for moderate pain or headache.   albuterol 108 (90 Base) MCG/ACT inhaler Commonly known as: VENTOLIN HFA Inhale 2 puffs into the lungs every 2 hours as needed for wheezing or shortness of breath.   carvedilol 6.25 MG tablet Commonly known as: COREG Take 1 tablet (6.25 mg total) by mouth 2 (two) times daily with a meal. What changed: See the new instructions.   diphenhydramine-acetaminophen 25-500 MG Tabs tablet Commonly known as: TYLENOL PM Take 1 tablet by mouth at bedtime as needed (sleep).   DULoxetine 30 MG capsule Commonly known as: CYMBALTA Take  30 mg by mouth daily.   Entresto 24-26 MG Generic drug: sacubitril-valsartan Take 1 tablet by mouth 2 (two) times daily.   ezetimibe 10 MG tablet Commonly known as: ZETIA Take 1 tablet (10 mg total) by mouth daily.   flecainide 50 MG tablet Commonly known as: TAMBOCOR Take 1 tablet (50 mg total) by mouth 2 (two) times daily.   furosemide 20 MG tablet Commonly known as: LASIX TAKE 1 TABLET (20 MG TOTAL) BY MOUTH AS NEEDED FOR FLUID (NO MORE THAN TWICE PER WEEK). What changed:  how much to take how to take this when to take this reasons to take this   Lagevrio 200 MG Caps capsule Generic drug: molnupiravir EUA Take 4 capsules by mouth 2  times daily for 5 days.   levothyroxine 50 MCG  tablet Commonly known as: SYNTHROID Take 50 mcg by mouth daily.   Magnesium 250 MG Tabs Take 250 mg by mouth daily.   multivitamin per tablet Take 1 tablet by mouth daily.   pantoprazole 40 MG tablet Commonly known as: PROTONIX Take 40 mg by mouth daily.   spironolactone 25 MG tablet Commonly known as: ALDACTONE Take 1 tablet (25 mg total) by mouth daily.   SYSTANE OP Place 1 drop into both eyes daily as needed (dry eyes).   vitamin B-12 1000 MCG tablet Commonly known as: CYANOCOBALAMIN Take 1,000 mcg by mouth daily.   vitamin C 1000 MG tablet Take 1,000 mg by mouth daily.   Vitamin D3 25 MCG tablet Commonly known as: Vitamin D Take 1 tablet (1,000 Units total) by mouth daily.   zinc sulfate 220 (50 Zn) MG capsule Take 1 capsule (220 mg total) by mouth daily.       Allergies  Allergen Reactions   Statins Other (See Comments)    "Makes her bones hurt and very sore"   Niaspan [Niacin Er] Other (See Comments)    High doses, causes hot flashes & aching    Simvastatin Other (See Comments)   Benadryl [Diphenhydramine] Other (See Comments)    Makes pt feel jittery    Dilaudid [Hydromorphone Hcl] Itching    Follow-up Information     Heather Frees, MD. Call today.   Specialty: Family Medicine Why: Please call for a posthospital follow-up appointment. Contact information: [place] [address] [phone]         Larey Dresser, MD Follow up today.   Specialty: Cardiology Why: The office should call you within 1-2 business days to arrange follow-up. If you do not hear from them in this time-frame, please contact the office. Contact information: [address] [place] [address] [phone]                  The results of significant diagnostics from this hospitalization (including imaging, microbiology, ancillary and laboratory) are listed below for reference.    Significant Diagnostic  Studies: DG Chest 2 View  Result Date: 01/16/2021 CLINICAL DATA:  87 year old female with history of shortness of breath. Near syncopal episode this morning. EXAM: CHEST - 2 VIEW COMPARISON:  Chest x-ray 07/15/2015. FINDINGS: Lung volumes are low. Eventration of the left hemidiaphragm. No consolidative airspace disease. No pleural effusions. No pneumothorax. No pulmonary nodule or mass noted. Pulmonary vasculature and the cardiomediastinal silhouette are within normal limits. Atherosclerotic calcifications are noted in the thoracic aorta. Left-sided biventricular pacemaker/AICD noted with lead tips projecting over the expected location of the right atrium, right ventricle and  left ventricle via the coronary sinus and coronary veins. IMPRESSION: 1. Low lung volumes without radiographic evidence of acute cardiopulmonary disease. 2. Aortic atherosclerosis. Electronically Signed   By: Vinnie Langton M.D.   On: 01/16/2021 12:21   CT Head Wo Contrast  Result Date: 01/16/2021 CLINICAL DATA:  Altered mental status near syncope EXAM: CT HEAD WITHOUT CONTRAST TECHNIQUE: Contiguous axial images were obtained from the base of the skull through the vertex without intravenous contrast. COMPARISON:  04/22/2005 FINDINGS: Brain: No acute intracranial findings are seen. Ventricles are not dilated. There is no shift of midline structures. Cortical sulci are prominent. There is subcentimeter low-density in the mesial left temporal lobe and basal ganglia with no significant interval change. This may suggest parahippocampal cyst or old lacunar infarct. Vascular: Unremarkable. Skull: Unremarkable. Sinuses/Orbits: Unremarkable. Other: No significant interval changes are noted. IMPRESSION: No acute intracranial findings are seen in noncontrast CT brain. Atrophy. Electronically Signed   By: Elmer Picker M.D.   On: 01/16/2021 12:41   ECHOCARDIOGRAM LIMITED  Result Date: 01/17/2021    ECHOCARDIOGRAM LIMITED REPORT    Patient Name:   Heather Smith Date of Exam: 01/17/2021 Medical Rec #:  [id number]        Height:       67.0 in Accession #:    [id number]       Weight:       189.9 lb Date of Birth:  April 11, 1932        BSA:          1.978 m Patient Age:    65 years         BP:           114/56 mmHg Patient Gender: F                HR:           69 bpm. Exam Location:  Inpatient Procedure: 2D Echo, Limited Echo, Cardiac Doppler and Color Doppler Indications:    Syncope  History:        Patient has prior history of Echocardiogram examinations. CHF;                 Risk Factors:Hypertension.  Sonographer:    Jyl Heinz Referring Phys: [place]  1. Normal LV and RV function and size. EF 55-60%. No regional wall motion abnormalities. Grade I diastolic dysfunction (normal for age).  2. Pacemaker lead is visualized  3. Left atrium is moderately dilated. The right atrium is normal  4. No significant valvular abnormalities  5. The IVC is not dilated, RA pressure 3 mmHg  6. There is normal pulmonary artery systolic pressure. Comparison(s): No significant change from prior study. FINDINGS  Left Ventricle: Normal LV and RV function and size. EF 55-60%. No regional wall motion abnormalities. Grade I diastolic dysfunction (normal for age). Right Ventricle: There is normal pulmonary artery systolic pressure. The tricuspid regurgitant velocity is 2.19 m/s, and with an assumed right atrial pressure of 3 mmHg, the estimated right ventricular systolic pressure is 03.7 mmHg. Aortic Valve: Aortic valve peak gradient measures 6.8 mmHg. LEFT VENTRICLE PLAX 2D LVIDd:         4.60 cm      Diastology LVIDs:         3.30 cm      LV e' medial:    5.98 cm/s LV PW:         1.10 cm      LV E/e' medial:  14.8 LV IVS:        1.00 cm      LV e' lateral:   7.07 cm/s LVOT diam:     2.00 cm      LV E/e' lateral: 12.6 LV SV:         63 LV SV Index:   32 LVOT Area:     3.14 cm  LV Volumes (MOD) LV vol d, MOD A2C: 77.3 ml LV vol d, MOD A4C: 103.0  ml LV vol s, MOD A2C: 35.8 ml LV vol s, MOD A4C: 44.3 ml LV SV MOD A2C:     41.5 ml LV SV MOD A4C:     103.0 ml LV SV MOD BP:      50.7 ml RIGHT VENTRICLE             IVC RV S prime:     12.00 cm/s  IVC diam: 1.40 cm TAPSE (M-mode): 2.1 cm LEFT ATRIUM         Index LA diam:    3.40 cm 1.72 cm/m  AORTIC VALVE AV Area (Vmax): 2.61 cm AV Vmax:        130.00 cm/s AV Peak Grad:   6.8 mmHg LVOT Vmax:      108.00 cm/s LVOT Vmean:     71.300 cm/s LVOT VTI:       0.202 m  AORTA Ao Root diam: 3.00 cm Ao Asc diam:  3.50 cm MITRAL VALVE               TRICUSPID VALVE MV Area (PHT): 3.87 cm    TR Peak grad:   19.2 mmHg MV Decel Time: 196 msec    TR Vmax:        219.00 cm/s MV E velocity: 88.80 cm/s MV A velocity: 93.60 cm/s  SHUNTS MV E/A ratio:  0.95        Systemic VTI:  0.20 m                            Systemic Diam: 2.00 cm Phineas Inches Electronically signed by Phineas Inches Signature Date/Time: 01/17/2021/4:48:45 PM    Final     Microbiology: Recent Results (from the past 240 hour(s))  Resp Panel by RT-PCR (Flu A&B, Covid) Nasopharyngeal Swab     Status: Abnormal   Collection Time: 01/16/21  1:38 PM   Specimen: Nasopharyngeal Swab; Nasopharyngeal(NP) swabs in vial transport medium  Result Value Ref Range Status   SARS Coronavirus 2 by RT PCR POSITIVE (A) NEGATIVE Final    Comment: (NOTE) SARS-CoV-2 target nucleic acids are DETECTED.  The SARS-CoV-2 RNA is generally detectable in upper respiratory specimens during the acute phase of infection. Positive results are indicative of the presence of the identified virus, but do not rule out bacterial infection or co-infection with other pathogens not detected by the test. Clinical correlation with patient history and other diagnostic information is necessary to determine patient infection status. The expected result is Negative.  Fact Sheet for Patients: EntrepreneurPulse.com.au  Fact Sheet for Healthcare  Providers: IncredibleEmployment.be  This test is not yet approved or cleared by the [place] FDA and  has been authorized for detection and/or diagnosis of SARS-CoV-2 by FDA under an Emergency Use Authorization (EUA).  This EUA will remain in effect (meaning this test can be used) for the duration of  the COVID-19 declaration under Section 564(b)(1) of the A ct, 21 U.S.C. section 360bbb-3(b)(1), unless the authorization  is terminated or revoked sooner.     Influenza A by PCR NEGATIVE NEGATIVE Final   Influenza B by PCR NEGATIVE NEGATIVE Final    Comment: (NOTE) The Xpert Xpress SARS-CoV-2/FLU/RSV plus assay is intended as an aid in the diagnosis of influenza from Nasopharyngeal swab specimens and should not be used as a sole basis for treatment. Nasal washings and aspirates are unacceptable for Xpert Xpress SARS-CoV-2/FLU/RSV testing.  Fact Sheet for Patients: EntrepreneurPulse.com.au  Fact Sheet for Healthcare Providers: IncredibleEmployment.be  This test is not yet approved or cleared by the [place] FDA and has been authorized for detection and/or diagnosis of SARS-CoV-2 by FDA under an Emergency Use Authorization (EUA). This EUA will remain in effect (meaning this test can be used) for the duration of the COVID-19 declaration under Section 564(b)(1) of the Act, 21 U.S.C. section 360bbb-3(b)(1), unless the authorization is terminated or revoked.  Performed at [facility], [place] [address] [place], [place] [address]      Labs: Basic Metabolic Panel: Recent Labs  Lab 01/16/21 1208 01/16/21 2240 01/17/21 0413 01/17/21 1115 01/18/21 0421 01/19/21 0407  NA 134*  --  139  --  135 139  K 4.3  --  4.4  --  4.2 3.6  CL 103  --  105  --  103 106  CO2 23  --  24  --  22 23  GLUCOSE 201*  --  115*  --  90 99  BUN 24*  --  24*  --  22 22  CREATININE 1.53* 1.41* 1.29*  --  1.10* 1.07*   CALCIUM 9.0  --  8.4*  --  8.3* 8.7*  MG  --   --   --  1.8  --  1.9  PHOS  --   --   --   --   --  3.5   Liver Function Tests: Recent Labs  Lab 01/17/21 0413  AST 20  ALT 14  ALKPHOS 49  BILITOT 0.7  PROT 6.6  ALBUMIN 3.9   No results for input(s): LIPASE, AMYLASE in the last 168 hours. No results for input(s): AMMONIA in the last 168 hours. CBC: Recent Labs  Lab 01/16/21 1208 01/16/21 2240 01/17/21 0413 01/18/21 0421  WBC 5.6 5.7 4.9 4.6  HGB 12.4 12.6 12.3 13.4  HCT 37.0 38.1 37.3 38.8  MCV 93.4 95.3 94.9 91.9  PLT 140* 112* 112* 117*   Cardiac Enzymes: No results for input(s): CKTOTAL, CKMB, CKMBINDEX, TROPONINI in the last 168 hours. BNP: BNP (last 3 results) Recent Labs    01/16/21 2240  BNP 221.9*    ProBNP (last 3 results) No results for input(s): PROBNP in the last 8760 hours.  CBG: No results for input(s): GLUCAP in the last 168 hours.     Signed:  Kayleen Memos, MD [facility] 01/20/2021, 6:21 PM

## 2021-01-20 NOTE — TOC Transition Note (Addendum)
Transition of Care [facility]) - CM/SW Discharge Note   Patient Details  Name: Heather Smith MRN: [id number] Date of Birth: 08/12/1932  Transition [facility] CM/SW Contact:  Ross Ludwig, LCSW Phone Number: 01/20/2021, 10:18 AM   Clinical Narrative:     CSW received consult that patient may need home health.  CSW spoke to patient and offered home health services, and patient declined home health PT and OT.  CSW to sign off, patient will be discharging back home today.   Final next level of care: Home/Self Care Barriers to Discharge: Barriers Resolved   Patient Goals and CMS Choice Patient states their goals for this hospitalization and ongoing recovery are:: To return back home. CMS Medicare.gov Compare Post Acute Care list provided to:: Patient Choice offered to / list presented to : Patient  Discharge Placement  Patient discharging back home today.                     Discharge Plan and Services                          HH Arranged: Patient Refused [facility]          Social Determinants of Health (SDOH) Interventions     Readmission Risk Interventions No flowsheet data found.

## 2021-01-20 NOTE — Progress Notes (Signed)
Pt is unable to sleep. She takes her neuropathy in her legs is keeping her awake. She would like something for it. MD notified.

## 2021-01-20 NOTE — Progress Notes (Signed)
Airborne and contact isolation reordered due to pts positive COVID status. Isolation reinstated.

## 2021-01-20 NOTE — Progress Notes (Signed)
Reached out to the doctor about reinstating the [place] isolation for patient's positive COVID status. MD Blount stated that the patient's isolation was purposefully discontinued and the patient did not need the isolation to be reinstated.

## 2021-01-23 ENCOUNTER — Other Ambulatory Visit (HOSPITAL_COMMUNITY): Payer: Self-pay | Admitting: Cardiology

## 2021-01-29 ENCOUNTER — Other Ambulatory Visit (HOSPITAL_COMMUNITY): Payer: Self-pay

## 2021-01-29 DIAGNOSIS — U071 COVID-19: Secondary | ICD-10-CM | POA: Diagnosis not present

## 2021-01-29 DIAGNOSIS — I10 Essential (primary) hypertension: Secondary | ICD-10-CM | POA: Diagnosis not present

## 2021-01-29 DIAGNOSIS — R55 Syncope and collapse: Secondary | ICD-10-CM | POA: Diagnosis not present

## 2021-01-30 DIAGNOSIS — M1990 Unspecified osteoarthritis, unspecified site: Secondary | ICD-10-CM | POA: Diagnosis not present

## 2021-01-30 DIAGNOSIS — I10 Essential (primary) hypertension: Secondary | ICD-10-CM | POA: Diagnosis not present

## 2021-01-30 DIAGNOSIS — E782 Mixed hyperlipidemia: Secondary | ICD-10-CM | POA: Diagnosis not present

## 2021-01-30 DIAGNOSIS — R69 Illness, unspecified: Secondary | ICD-10-CM | POA: Diagnosis not present

## 2021-01-30 DIAGNOSIS — I6789 Other cerebrovascular disease: Secondary | ICD-10-CM | POA: Diagnosis not present

## 2021-01-30 DIAGNOSIS — K219 Gastro-esophageal reflux disease without esophagitis: Secondary | ICD-10-CM | POA: Diagnosis not present

## 2021-01-30 DIAGNOSIS — E039 Hypothyroidism, unspecified: Secondary | ICD-10-CM | POA: Diagnosis not present

## 2021-01-30 DIAGNOSIS — N183 Chronic kidney disease, stage 3 unspecified: Secondary | ICD-10-CM | POA: Diagnosis not present

## 2021-01-30 DIAGNOSIS — I5022 Chronic systolic (congestive) heart failure: Secondary | ICD-10-CM | POA: Diagnosis not present

## 2021-02-03 NOTE — Progress Notes (Signed)
Patient ID: Heather Smith, female   DOB: February 04, 1932, 88 y.o.   MRN: [id number] PCP: Dr. Kenton Kingfisher HF Cardiology: Aundra Dubin  88 y.o. with history of nonischemic cardiomyopathy and chronic systolic HF.  In 9/14, she was admitted to [facility] with chest pain.  Cardiac cath was done, showing no significant coronary disease.  However, echo showed EF 30-35%.  She was started on meds for CHF, and echo was repeated in 12/14, EF was read as 20-25%.  She had a Medtonic CRT-D device placed (had baseline LBBB).  In 3/15, the LV lead was not capturing properly so device had to be reprogrammed.  She had AV optimization in 5/15.  Limited echo at that time showed improvement in EF to 40-45%.  CPX in 5/15 was submaximal but suggested low normal to mildly reduced functional capacity. Echo in 6/16 showed EF 35-40% with diffuse hypokinesis.  CPX was done in 9/16.  This actually showed good functional capacity. She had repeat echo in 1/17, showing EF increased to 60-65%.  Most recent echo in 8/18 showed EF 50-55% with mild LV dilation and mild MR.    She was seen by Dr. Caryl Comes for frequent PVCs (symptomatic).  She was started on flecainide.  She thinks that this has helped .   Atypical chest pain in 11/19, Cardiolite was normal.   Repeat echo in 1/20 showed EF 50-55%, moderate MR.    PYP scan in 1/21 was not suggestive of TTR amyloidosis.  Echo in 5/21 showed EF 50-55%, normal RV, mild MR.   She was admitted 12/22 with near syncope and AKI due to hypotension in setting of COVID-19 infection. SCr 1.5 on admit (baseline ~1.1). Also had brief episodes of atrial flutter, captured on device interrogation (longest duration 8 min). Head CT was negative. She was treated by general cardiology. Per documentation, there was no evidence of  acute HF decompensation/hypervolemia. Coreg, Entresto and diuretics were initially held. Gentle IVF hydration administered. AKI resolved and BP improved. She had no further AFL/SVT.  Echo showed EF  55-60%. RV ok. No significant valvular dysfunction. HF meds were reintroduced prior to d/c, lasix PRN. She completed antiviral Molpunavir for COVID. Scr 1.07 at discharge.   She presents to [facility] for post hospital f/u. Here w/ her daughter. Still feels tired/ fatigued. No dyspnea. Denies CP.  No palpitations. BP normotensive. No further hypotension at home. Thoracic impedence/Optivol c/w euvolemia. No further AF/AT on device interrogation. No VT/VF. 100% BiV paced. She saw her PCP, Dr. Kenton Kingfisher, last week at [facility] and had blood work done there.    ECG: not performed today. No AT/AF on device interrogation.  Medtronic device interrogation: Fluid index < threshold, >99% BiV pacing  Labs (9/14): TSH normal Labs (3/15): K 4.7, creatinine 1.1 Labs (5/15): K 4.9, creatinine 1.1, BNP 44 Labs (6/15): K 5.1, creatinine 1.4, pro-BNP 39 Labs (6/15): K 5.3, creatinine 1.53 Labs (12/15): K 4.4, creatinine 1.03 Labs (5/16): K 4, creatinine 1.0, BNP 34.6 Labs (6/16): K 4.3, creatinine 1.1 Labs (9/16): K 4.4, creatinine 1.16 Labs (11/16): K 4.5, creatinine 1.16 Labs (12/16): K 4.6, creatinine 1.16, BNP 22, Mg 1.9 Labs (1/17): K 4.3, creatinine 1.34, HCT 42.4, ESR normal Labs (6/17): K 4.6, creatinine 1.23, hgb 9.9, TSH normal Labs (6/18): K 4.7, creatinine 1.09, BNP 94, hgb 13.8, TSH normal Labs (12/18): K 4.7, creatinine 0.98 Labs (10/19): BNP 25, K 4.8, creatinine 1.11 Labs (7/20): K 5, creatinine 1.17 Labs (9/20): K 4.9, creatinine 1.37 Labs (4/21): TSH  normal, hgb 13.5, K 4.3, creatinine 1.3 Labs (1/22): K 4.4, creatinine 1.18 Lab (12/22): K 3.6, creatinine 1.07   PMH: 1. Chronic systolic CHF: Nonischemic cardiomyopathy.  LHC (9/14) with no significant coronary disease, EF 25-30% with global hypokinesis.  Echo (9/14) with EF 30-35%, moderate LV dilation, diffuse hypokinesis.  Echo (12/14) with EF 20-25%.  She does not drink ETOH.  She had Medtronic CRT-D device implantation in 1/15.  Echo (5/15) with EF 40-45%, mild LVH.  CPX (5/15) was submaximal with RER 0.99, peak VO2 14, VE/VCO2 37.3; low normal functional capacity may be limited by loss of BiV pacing at higher HR and by deconditioning but interpretation somewhat limited since study was submaximal.  Echo (6/16) with EF 35-40%, severe LV dilation, diffuse hypokinesis, mild MR.  CPX (9/16) with peak VO2 17.7, VE/VCO2 slope 32.3, RER 1.11 => excellent functional capacity. Echo (11/17) with EF 60-65%.  - Echo (8/18): EF 50-55%, mildly dilated LV, mild MR.  - Echo (1/20): EF 50-55%, moderate MR.  - PYP scan (1/21): H/CL 1.0, grade 1.  Not suggestive of TTR amyloidosis.  - Echo (5/21): EF 50-55%, normal RV, mild MR. 2. Back pain s/p lumbar laminectomy 3. OA: Knees. Left TKR in 6/17.  4. Hyperlipidemia 5. Depression 6. PVCs 7. Peripheral neuropathy: Uncertain etiology.  She is not a diabetic.  8. Lung nodule: Last CT chest in 1/19 showed stable 6 mm LLL nodule, no further followup recommended.  9. Chest pain: Cardiolite (11/19) with EF 74%, no evidence for ischemia/infarction.   SH: [place], married, never smoked, no ETOH.    FH: Mother with MI at 85, possible ischemic cardiomyopathy.  Father with cardiomyopathy, died at 65.  She has 5 brothers, none with significant coronary disease.    ROS: All systems reviewed and negative except as per HPI.   Current Outpatient Medications  Medication Sig Dispense Refill   acetaminophen (TYLENOL) 325 MG tablet Take 325 mg by mouth every 6 (six) hours as needed for moderate pain or headache.     albuterol (VENTOLIN HFA) 108 (90 Base) MCG/ACT inhaler Inhale 2 puffs into the lungs every 2 hours as needed for wheezing or shortness of breath. 8.5 g 0   Ascorbic Acid (VITAMIN C) 1000 MG tablet Take 1,000 mg by mouth daily.     carvedilol (COREG) 6.25 MG tablet Take 1 tablet (6.25 mg total) by mouth 2 (two) times daily with a meal. 60 tablet 0   Vitamin D3 (VITAMIN D) 25 MCG  tablet Take 1 tablet (1,000 Units total) by mouth daily. 30 tablet 0   diphenhydramine-acetaminophen (TYLENOL PM) 25-500 MG TABS tablet Take 1 tablet by mouth at bedtime as needed (sleep).     DULoxetine (CYMBALTA) 30 MG capsule Take 30 mg by mouth daily.      ezetimibe (ZETIA) 10 MG tablet Take 1 tablet (10 mg total) by mouth daily. 90 tablet 3   flecainide (TAMBOCOR) 50 MG tablet Take 1 tablet (50 mg total) by mouth 2 (two) times daily. 180 tablet 3   furosemide (LASIX) 20 MG tablet TAKE ONE TABLET BY MOUTH daily AS NEEDED FOR fluid (no more THAN twice PER WEEK) 8 tablet 11   levothyroxine (SYNTHROID, LEVOTHROID) 50 MCG tablet Take 50 mcg by mouth daily.     Magnesium 250 MG TABS Take 250 mg by mouth daily.     multivitamin (THERAGRAN) per tablet Take 1 tablet by mouth daily.     pantoprazole (PROTONIX) 40 MG tablet Take 40  mg by mouth daily.     Polyethyl Glycol-Propyl Glycol (SYSTANE OP) Place 1 drop into both eyes daily as needed (dry eyes).     sacubitril-valsartan (ENTRESTO) 24-26 MG Take 1 tablet by mouth 2 (two) times daily. 180 tablet 3   spironolactone (ALDACTONE) 25 MG tablet Take 1 tablet (25 mg total) by mouth daily. 90 tablet 3   vitamin B-12 (CYANOCOBALAMIN) 1000 MCG tablet Take 1,000 mcg by mouth daily.     zinc sulfate 220 (50 Zn) MG capsule Take 1 capsule (220 mg total) by mouth daily. 30 capsule 0   Current Facility-Administered Medications  Medication Dose Route Frequency Provider Last Rate Last Admin   triamcinolone acetonide (KENALOG) 10 MG/ML injection 10 mg  10 mg Other Once Regal, Norman S, DPM       triamcinolone acetonide (KENALOG) 10 MG/ML injection 10 mg  10 mg Other Once Regal, Norman S, DPM       triamcinolone acetonide (KENALOG) 10 MG/ML injection 10 mg  10 mg Other Once Wallene Huh, DPM       There were no vitals filed for this visit.  Wt Readings from Last 3 Encounters:  01/20/21 85.7 kg (188 lb 15 oz)  11/07/20 86.5 kg (190 lb 9.6 oz)  04/29/20 86.6  kg (191 lb)   PHYSICAL EXAM: General:  Well appearing. No respiratory difficulty HEENT: normal Neck: supple. no JVD. Carotids 2+ bilat; no bruits. No lymphadenopathy or thyromegaly appreciated. Cor: PMI nondisplaced. Regular rate & rhythm. No rubs, gallops or murmurs. Lungs: clear Abdomen: soft, nontender, nondistended. No hepatosplenomegaly. No bruits or masses. Good bowel sounds. Extremities: no cyanosis, clubbing, rash, edema Neuro: alert & oriented x 3, cranial nerves grossly intact. moves all 4 extremities w/o difficulty. Affect pleasant.   Assessment/Plan: 1. Chronic systolic CHF: Nonischemic cardiomyopathy s/p Medtronic CRT-D. EF 35-40% (6/16). She does have a family history of cardiomyopathy of uncertain etiology (father) but none of her siblings developed a cardiomyopathy.  Familial cardiomyopathy is a consideration.  Viral myocarditis is also a possible etiology.  Echo in 1/17 showed improvement in EF to 60-65%, echo in 8/18 showed EF 50-55% and echo in 1/20 showed stable EF 50-55%.  PYP scan not suggestive of TTR amyloidosis. Echo in 5/21 showed EF 50-55%, normal RV, mild MR. Echo 12/22 EF 55-60%, RV ok.  - NYHA Class II. C/w persistent fatigue  - Euvolemic on exam and by device interrogation. No VT/VF. 100% BiV paced - Continue Entresto 24-26 mg bid. BP too soft for titration  - Continue Spiro 25 mg daily  - Continue Coreg 3.125 mg bid  - Lasix 20 mg PRN  - Has CRT-D device  - Will obtain copy of recent BMP from PCP office  2. PVCs: Followed by EP, now on flecainide. Minimal palpitations now.  - Continue flecainide 50 mg bid.  Will need nodal blocking agent while on flecainide, she is on Coreg.  3. Peripheral neuropathy: Bothersome peripheral neuropathy. PYP scan not suggestive of TTR amyloidosis.                     4. Fatigue: Chronic, has been present for years.  5. HTN: Controlled on current regimen  6. PAD: Abnormal ABIs on insurance screening.  She has peripheral  neuropathy symptoms but no definite claudication. LE arterial dopplers 10/22 w/ normal ABIs  7. HLD: LP 10/22 w/ elevated LDL, 124 mg/dL. Goal LDL < 70. Zetia 10 mg daily recently restarted (cannot take statins). Repeat LP.  If LDL not < 70 on followup lipid profile, refer for Repatha.  8. Atrial Flutter: brief episode 12/22 in setting of COVID infection/ dehydration. <10 min duration on device interrogation. No further AT/AF detected on today's interrogation. No indication for a/c. Device followed in EP device clinic  9. COVID 19 infection: 12/22.  - Completed antiviral Molpunavir  Follow-up w/ APP in 4 months                                             Lyda Jester, PA-C  02/03/2021

## 2021-02-04 ENCOUNTER — Encounter (HOSPITAL_COMMUNITY): Payer: Self-pay

## 2021-02-04 ENCOUNTER — Ambulatory Visit (HOSPITAL_COMMUNITY)
Admission: RE | Admit: 2021-02-04 | Discharge: 2021-02-04 | Disposition: A | Discharge status: Home / Self Care | Payer: Medicare HMO | Source: Ambulatory Visit | Attending: Cardiology | Admitting: Cardiology

## 2021-02-04 ENCOUNTER — Other Ambulatory Visit: Payer: Self-pay

## 2021-02-04 VITALS — BP 112/62 | HR 71 | Wt 186.2 lb

## 2021-02-04 DIAGNOSIS — I5022 Chronic systolic (congestive) heart failure: Secondary | ICD-10-CM | POA: Insufficient documentation

## 2021-02-04 DIAGNOSIS — Z8616 Personal history of COVID-19: Secondary | ICD-10-CM | POA: Diagnosis not present

## 2021-02-04 DIAGNOSIS — G629 Polyneuropathy, unspecified: Secondary | ICD-10-CM | POA: Diagnosis not present

## 2021-02-04 DIAGNOSIS — I110 Hypertensive heart disease with heart failure: Secondary | ICD-10-CM | POA: Insufficient documentation

## 2021-02-04 DIAGNOSIS — R5383 Other fatigue: Secondary | ICD-10-CM | POA: Insufficient documentation

## 2021-02-04 DIAGNOSIS — E785 Hyperlipidemia, unspecified: Secondary | ICD-10-CM | POA: Insufficient documentation

## 2021-02-04 DIAGNOSIS — Z79899 Other long term (current) drug therapy: Secondary | ICD-10-CM | POA: Insufficient documentation

## 2021-02-04 DIAGNOSIS — I493 Ventricular premature depolarization: Secondary | ICD-10-CM | POA: Insufficient documentation

## 2021-02-04 DIAGNOSIS — Z4889 Encounter for other specified surgical aftercare: Secondary | ICD-10-CM | POA: Diagnosis not present

## 2021-02-04 DIAGNOSIS — Z45018 Encounter for adjustment and management of other part of cardiac pacemaker: Secondary | ICD-10-CM | POA: Diagnosis not present

## 2021-02-04 DIAGNOSIS — I4892 Unspecified atrial flutter: Secondary | ICD-10-CM | POA: Insufficient documentation

## 2021-02-04 DIAGNOSIS — I5032 Chronic diastolic (congestive) heart failure: Secondary | ICD-10-CM | POA: Diagnosis not present

## 2021-02-04 DIAGNOSIS — I739 Peripheral vascular disease, unspecified: Secondary | ICD-10-CM | POA: Diagnosis not present

## 2021-02-04 DIAGNOSIS — Z8249 Family history of ischemic heart disease and other diseases of the circulatory system: Secondary | ICD-10-CM | POA: Insufficient documentation

## 2021-02-04 NOTE — Patient Instructions (Signed)
Thank you for coming in today  Your physician recommends that you schedule a follow-up appointment in: 05/06/2021 at 2:30pm  At the [facility], you and your health needs are our priority. As part of our continuing mission to provide you with exceptional heart care, we have created designated Provider Care Teams. These Care Teams include your primary Cardiologist (physician) and Advanced Practice Providers (APPs- Physician Assistants and Nurse Practitioners) who all work together to provide you with the care you need, when you need it.   You may see any of the following providers on your designated Care Team at your next follow up: Dr Glori Bickers Dr Haynes Kerns, NP Lyda Jester, [place] [facility] [place], [place] Audry Riles, PharmD   Please be sure to bring in all your medications bottles to every appointment.    If you have any questions or concerns before your next appointment please send [place] a message through [place] or call our office at [phone].    TO LEAVE A MESSAGE FOR THE NURSE SELECT OPTION 2, PLEASE LEAVE A MESSAGE INCLUDING: YOUR NAME DATE OF BIRTH CALL BACK NUMBER REASON FOR CALL**this is important as we prioritize the call backs  YOU WILL RECEIVE A CALL BACK THE SAME DAY AS LONG AS YOU CALL BEFORE 4:00 PM

## 2021-02-07 ENCOUNTER — Other Ambulatory Visit (HOSPITAL_COMMUNITY): Payer: Self-pay | Admitting: Cardiology

## 2021-02-20 ENCOUNTER — Other Ambulatory Visit (HOSPITAL_COMMUNITY): Payer: Self-pay | Admitting: *Deleted

## 2021-02-20 DIAGNOSIS — K219 Gastro-esophageal reflux disease without esophagitis: Secondary | ICD-10-CM | POA: Diagnosis not present

## 2021-02-20 DIAGNOSIS — E782 Mixed hyperlipidemia: Secondary | ICD-10-CM | POA: Diagnosis not present

## 2021-02-20 DIAGNOSIS — E039 Hypothyroidism, unspecified: Secondary | ICD-10-CM | POA: Diagnosis not present

## 2021-02-20 DIAGNOSIS — R69 Illness, unspecified: Secondary | ICD-10-CM | POA: Diagnosis not present

## 2021-02-20 DIAGNOSIS — I10 Essential (primary) hypertension: Secondary | ICD-10-CM | POA: Diagnosis not present

## 2021-02-20 DIAGNOSIS — I5022 Chronic systolic (congestive) heart failure: Secondary | ICD-10-CM | POA: Diagnosis not present

## 2021-02-20 MED ORDER — CARVEDILOL 3.125 MG PO TABS: 3.1250 mg | ORAL_TABLET | Freq: Two times a day (BID) | ORAL | 3 refills | Status: DC

## 2021-02-21 ENCOUNTER — Other Ambulatory Visit (HOSPITAL_COMMUNITY): Payer: Self-pay | Admitting: Cardiology

## 2021-02-25 ENCOUNTER — Other Ambulatory Visit (HOSPITAL_COMMUNITY): Payer: Self-pay | Admitting: Cardiology

## 2021-02-28 DIAGNOSIS — E039 Hypothyroidism, unspecified: Secondary | ICD-10-CM | POA: Diagnosis not present

## 2021-02-28 DIAGNOSIS — N183 Chronic kidney disease, stage 3 unspecified: Secondary | ICD-10-CM | POA: Diagnosis not present

## 2021-02-28 DIAGNOSIS — E782 Mixed hyperlipidemia: Secondary | ICD-10-CM | POA: Diagnosis not present

## 2021-02-28 DIAGNOSIS — I10 Essential (primary) hypertension: Secondary | ICD-10-CM | POA: Diagnosis not present

## 2021-02-28 DIAGNOSIS — I5022 Chronic systolic (congestive) heart failure: Secondary | ICD-10-CM | POA: Diagnosis not present

## 2021-02-28 DIAGNOSIS — K219 Gastro-esophageal reflux disease without esophagitis: Secondary | ICD-10-CM | POA: Diagnosis not present

## 2021-02-28 DIAGNOSIS — R69 Illness, unspecified: Secondary | ICD-10-CM | POA: Diagnosis not present

## 2021-02-28 DIAGNOSIS — G629 Polyneuropathy, unspecified: Secondary | ICD-10-CM | POA: Diagnosis not present

## 2021-02-28 DIAGNOSIS — Z0000 Encounter for general adult medical examination without abnormal findings: Secondary | ICD-10-CM | POA: Diagnosis not present

## 2021-02-28 DIAGNOSIS — R7303 Prediabetes: Secondary | ICD-10-CM | POA: Diagnosis not present

## 2021-03-20 ENCOUNTER — Ambulatory Visit (INDEPENDENT_AMBULATORY_CARE_PROVIDER_SITE_OTHER): Payer: Medicare HMO

## 2021-03-20 DIAGNOSIS — I428 Other cardiomyopathies: Secondary | ICD-10-CM | POA: Diagnosis not present

## 2021-03-20 LAB — CUP PACEART REMOTE DEVICE CHECK
Battery Remaining Longevity: 23 mo
Battery Voltage: 2.94 V
Brady Statistic AP VP Percent: 29.21 %
Brady Statistic AP VS Percent: 0.41 %
Brady Statistic AS VP Percent: 69.33 %
Brady Statistic AS VS Percent: 1.04 %
Brady Statistic RA Percent Paced: 29.52 %
Brady Statistic RV Percent Paced: 98.02 %
Date Time Interrogation Session: 20230216073623
HighPow Impedance: 70 Ohm
Implantable Lead Implant Date: 20150121
Implantable Lead Implant Date: 20150121
Implantable Lead Implant Date: 20150121
Implantable Lead Location: 753858
Implantable Lead Location: 753859
Implantable Lead Location: 753860
Implantable Lead Model: 4298
Implantable Lead Model: 5076
Implantable Pulse Generator Implant Date: 20200717
Lead Channel Impedance Value: 188.1 Ohm
Lead Channel Impedance Value: 188.1 Ohm
Lead Channel Impedance Value: 195.429
Lead Channel Impedance Value: 209 Ohm
Lead Channel Impedance Value: 218.087
Lead Channel Impedance Value: 342 Ohm
Lead Channel Impedance Value: 418 Ohm
Lead Channel Impedance Value: 418 Ohm
Lead Channel Impedance Value: 418 Ohm
Lead Channel Impedance Value: 456 Ohm
Lead Channel Impedance Value: 513 Ohm
Lead Channel Impedance Value: 532 Ohm
Lead Channel Impedance Value: 589 Ohm
Lead Channel Impedance Value: 646 Ohm
Lead Channel Impedance Value: 665 Ohm
Lead Channel Impedance Value: 665 Ohm
Lead Channel Impedance Value: 703 Ohm
Lead Channel Impedance Value: 760 Ohm
Lead Channel Pacing Threshold Amplitude: 0.625 V
Lead Channel Pacing Threshold Amplitude: 0.625 V
Lead Channel Pacing Threshold Amplitude: 3.5 V
Lead Channel Pacing Threshold Pulse Width: 0.4 ms
Lead Channel Pacing Threshold Pulse Width: 0.4 ms
Lead Channel Pacing Threshold Pulse Width: 1 ms
Lead Channel Sensing Intrinsic Amplitude: 16.875 mV
Lead Channel Sensing Intrinsic Amplitude: 16.875 mV
Lead Channel Sensing Intrinsic Amplitude: 3.375 mV
Lead Channel Sensing Intrinsic Amplitude: 3.375 mV
Lead Channel Setting Pacing Amplitude: 1.5 V
Lead Channel Setting Pacing Amplitude: 2 V
Lead Channel Setting Pacing Amplitude: 3 V
Lead Channel Setting Pacing Pulse Width: 0.4 ms
Lead Channel Setting Pacing Pulse Width: 1 ms
Lead Channel Setting Sensing Sensitivity: 0.3 mV

## 2021-03-24 DIAGNOSIS — E039 Hypothyroidism, unspecified: Secondary | ICD-10-CM | POA: Diagnosis not present

## 2021-03-24 DIAGNOSIS — I10 Essential (primary) hypertension: Secondary | ICD-10-CM | POA: Diagnosis not present

## 2021-03-24 DIAGNOSIS — I5022 Chronic systolic (congestive) heart failure: Secondary | ICD-10-CM | POA: Diagnosis not present

## 2021-03-24 DIAGNOSIS — E782 Mixed hyperlipidemia: Secondary | ICD-10-CM | POA: Diagnosis not present

## 2021-03-25 NOTE — Progress Notes (Signed)
Remote ICD transmission.   

## 2021-04-17 DIAGNOSIS — I10 Essential (primary) hypertension: Secondary | ICD-10-CM | POA: Diagnosis not present

## 2021-04-17 DIAGNOSIS — I5022 Chronic systolic (congestive) heart failure: Secondary | ICD-10-CM | POA: Diagnosis not present

## 2021-04-17 DIAGNOSIS — E782 Mixed hyperlipidemia: Secondary | ICD-10-CM | POA: Diagnosis not present

## 2021-05-05 NOTE — Progress Notes (Addendum)
?Patient ID: Heather Smith, female   DOB: [birth year], 88 y.o.   MRN: [id number] ?PCP: Dr. Kenton Kingfisher ?HF Cardiology: Aundra Dubin ? ?88 y.o. with history of nonischemic cardiomyopathy and chronic systolic HF.  In 9/14, she was admitted to [facility] with chest pain.  Cardiac cath was done, showing no significant coronary disease.  However, echo showed EF 30-35%.  She was started on meds for CHF, and echo was repeated in 12/14, EF was read as 20-25%.  She had a Medtonic CRT-D device placed (had baseline LBBB).  In 3/15, the LV lead was not capturing properly so device had to be reprogrammed.  She had AV optimization in 5/15.  Limited echo at that time showed improvement in EF to 40-45%.  CPX in 5/15 was submaximal but suggested low normal to mildly reduced functional capacity. Echo in 6/16 showed EF 35-40% with diffuse hypokinesis.  CPX was done in 9/16.  This actually showed good functional capacity. She had repeat echo in 1/17, showing EF increased to 60-65%.  Most recent echo in 8/18 showed EF 50-55% with mild LV dilation and mild MR.   ? ?She was seen by Dr. Caryl Comes for frequent PVCs (symptomatic).  She was started on flecainide.  She thinks that this has helped .  ? ?Atypical chest pain in 11/19, Cardiolite was normal.  ? ?Repeat echo in 1/20 showed EF 50-55%, moderate MR.   ? ?PYP scan in 1/21 was not suggestive of TTR amyloidosis.  Echo in 5/21 showed EF 50-55%, normal RV, mild MR.  ? ?She was admitted 12/22 with near syncope and AKI due to hypotension in setting of COVID-19 infection. SCr 1.5 on admit (baseline ~1.1). Also had brief episodes of atrial flutter, captured on device interrogation (longest duration 8 min). Head CT was negative. She was treated by general cardiology. Per documentation, there was no evidence of  acute HF decompensation/hypervolemia. Coreg, Entresto and diuretics were initially held. Gentle IVF hydration administered. AKI resolved and BP improved. She had no further AFL/SVT.  Echo showed EF  55-60%. RV ok. No significant valvular dysfunction. HF meds were reintroduced prior to d/c, lasix PRN. She completed antiviral Molpunavir for COVID. Scr 1.07 at discharge.  ? ?Today she returns for HF follow up. Overall feeling fine, remains generally fatigued. She has dyspnea with hills or inclines. Minimal palpitations. Denies abnormal bleeding, CP, dizziness, edema, or PND/Orthopnea. Appetite ok. No fever or chills. Weight at home 182 pounds. Has not needed any lasix recently. Taking 1/2 tablet of Entresto 49/51. ? ?ECG (personally reviewed): v-paced 69 bpm ? ?Medtronic device interrogation: Fluid index < threshold, >99% BiV pacing, no AF, no VT (personally reviewed). ? ?Labs (9/14): TSH normal ?Labs (3/15): K 4.7, creatinine 1.1 ?Labs (5/15): K 4.9, creatinine 1.1, BNP 44 ?Labs (6/15): K 5.1, creatinine 1.4, pro-BNP 39 ?Labs (6/15): K 5.3, creatinine 1.53 ?Labs (12/15): K 4.4, creatinine 1.03 ?Labs (5/16): K 4, creatinine 1.0, BNP 34.6 ?Labs (6/16): K 4.3, creatinine 1.1 ?Labs (9/16): K 4.4, creatinine 1.16 ?Labs (11/16): K 4.5, creatinine 1.16 ?Labs (12/16): K 4.6, creatinine 1.16, BNP 22, Mg 1.9 ?Labs (1/17): K 4.3, creatinine 1.34, HCT 42.4, ESR normal ?Labs (6/17): K 4.6, creatinine 1.23, hgb 9.9, TSH normal ?Labs (6/18): K 4.7, creatinine 1.09, BNP 94, hgb 13.8, TSH normal ?Labs (12/18): K 4.7, creatinine 0.98 ?Labs (10/19): BNP 25, K 4.8, creatinine 1.11 ?Labs (7/20): K 5, creatinine 1.17 ?Labs (9/20): K 4.9, creatinine 1.37 ?Labs (4/21): TSH normal, hgb 13.5, K 4.3, creatinine 1.3 ?Labs (1/22):  K 4.4, creatinine 1.18 ?Lab (12/22): K 3.6, creatinine 1.07  ? ?PMH: ?1. Chronic systolic CHF: Nonischemic cardiomyopathy.  LHC (9/14) with no significant coronary disease, EF 25-30% with global hypokinesis.  Echo (9/14) with EF 30-35%, moderate LV dilation, diffuse hypokinesis.  Echo (12/14) with EF 20-25%.  She does not drink ETOH.  She had Medtronic CRT-D device implantation in 1/15. Echo (5/15) with EF 40-45%,  mild LVH.  CPX (5/15) was submaximal with RER 0.99, peak VO2 14, VE/VCO2 37.3; low normal functional capacity may be limited by loss of BiV pacing at higher HR and by deconditioning but interpretation somewhat limited since study was submaximal.  Echo (6/16) with EF 35-40%, severe LV dilation, diffuse hypokinesis, mild MR.  CPX (9/16) with peak VO2 17.7, VE/VCO2 slope 32.3, RER 1.11 => excellent functional capacity. Echo (11/17) with EF 60-65%.  ?- Echo (8/18): EF 50-55%, mildly dilated LV, mild MR.  ?- Echo (1/20): EF 50-55%, moderate MR.  ?- PYP scan (1/21): H/CL 1.0, grade 1.  Not suggestive of TTR amyloidosis.  ?- Echo (5/21): EF 50-55%, normal RV, mild MR. ?2. Back pain s/p lumbar laminectomy ?3. OA: Knees. Left TKR in 6/17.  ?4. Hyperlipidemia ?5. Depression ?6. PVCs ?7. Peripheral neuropathy: Uncertain etiology.  She is not a diabetic.  ?8. Lung nodule: Last CT chest in 1/19 showed stable 6 mm LLL nodule, no further followup recommended.  ?9. Chest pain: Cardiolite (11/19) with EF 74%, no evidence for ischemia/infarction.  ? ?SH: [place], married, never smoked, no ETOH.   ? ?FH: Mother with MI at 77, possible ischemic cardiomyopathy.  Father with cardiomyopathy, died at 85.  She has 5 brothers, none with significant coronary disease.   ? ?ROS: All systems reviewed and negative except as per HPI.  ? ?Current Outpatient Medications  ?Medication Sig Dispense Refill  ? acetaminophen (TYLENOL) 325 MG tablet Take 325 mg by mouth every 6 (six) hours as needed for moderate pain or headache.    ? albuterol (VENTOLIN HFA) 108 (90 Base) MCG/ACT inhaler Inhale 2 puffs into the lungs every 2 hours as needed for wheezing or shortness of breath. 8.5 g 0  ? Ascorbic Acid (VITAMIN C) 1000 MG tablet Take 1,000 mg by mouth daily. As needed    ? carvedilol (COREG) 3.125 MG tablet Take 1 tablet (3.125 mg total) by mouth 2 (two) times daily. 60 tablet 3  ? Cholecalciferol (VITAMIN D-3) 125 MCG (5000 UT) TABS Take by  mouth.    ? diphenhydramine-acetaminophen (TYLENOL PM) 25-500 MG TABS tablet Take 1 tablet by mouth at bedtime as needed (sleep).    ? DULoxetine (CYMBALTA) 30 MG capsule Take 30 mg by mouth daily.     ? ezetimibe (ZETIA) 10 MG tablet Take 1 tablet by mouth once daily 90 tablet 0  ? flecainide (TAMBOCOR) 50 MG tablet Take 1 tablet (50 mg total) by mouth 2 (two) times daily. 180 tablet 3  ? furosemide (LASIX) 20 MG tablet TAKE ONE TABLET BY MOUTH daily AS NEEDED FOR fluid (no more THAN twice PER WEEK) 8 tablet 11  ? levothyroxine (SYNTHROID, LEVOTHROID) 50 MCG tablet Take 50 mcg by mouth daily.    ? Magnesium 250 MG TABS Take 250 mg by mouth daily.    ? multivitamin (THERAGRAN) per tablet Take 1 tablet by mouth daily.    ? pantoprazole (PROTONIX) 40 MG tablet Take 40 mg by mouth daily.    ? Polyethyl Glycol-Propyl Glycol (SYSTANE OP) Place 1 drop into both  eyes daily as needed (dry eyes).    ? sacubitril-valsartan (ENTRESTO) 24-26 MG Take 1 tablet by mouth 2 (two) times daily. 180 tablet 3  ? spironolactone (ALDACTONE) 25 MG tablet Take 1 tablet (25 mg total) by mouth daily. 90 tablet 3  ? vitamin B-12 (CYANOCOBALAMIN) 1000 MCG tablet Take 1,000 mcg by mouth daily.    ? zinc gluconate 50 MG tablet Take 50 mg by mouth daily.    ? ?Current Facility-Administered Medications  ?Medication Dose Route Frequency Provider Last Rate Last Admin  ? triamcinolone acetonide (KENALOG) 10 MG/ML injection 10 mg  10 mg Other Once Regal, Norman S, DPM      ? triamcinolone acetonide (KENALOG) 10 MG/ML injection 10 mg  10 mg Other Once Regal, Norman S, DPM      ? triamcinolone acetonide (KENALOG) 10 MG/ML injection 10 mg  10 mg Other Once Wallene Huh, DPM      ? ?BP 120/76   Pulse 87   Wt 84.9 kg (187 lb 3.2 oz)   SpO2 95%   BMI 29.32 kg/m?  ? ?Wt Readings from Last 3 Encounters:  ?05/06/21 84.9 kg (187 lb 3.2 oz)  ?02/04/21 84.5 kg (186 lb 3.2 oz)  ?01/20/21 85.7 kg (188 lb 15 oz)  ? ?PHYSICAL EXAM: ?General:  NAD. No resp  difficulty ?HEENT: Normal ?Neck: Supple. No JVD. Carotids 2+ bilat; no bruits. No lymphadenopathy or thryomegaly appreciated. ?Cor: PMI nondisplaced. Regular rate & rhythm. No rubs, gallops or murmurs. ?Lungs: C

## 2021-05-06 ENCOUNTER — Ambulatory Visit (HOSPITAL_COMMUNITY)
Admission: RE | Admit: 2021-05-06 | Discharge: 2021-05-06 | Disposition: A | Discharge status: Home / Self Care | Payer: Medicare HMO | Source: Ambulatory Visit | Attending: Family Medicine | Admitting: Family Medicine

## 2021-05-06 ENCOUNTER — Encounter (HOSPITAL_COMMUNITY): Payer: Self-pay

## 2021-05-06 VITALS — BP 120/76 | HR 87 | Wt 187.2 lb

## 2021-05-06 DIAGNOSIS — Z8249 Family history of ischemic heart disease and other diseases of the circulatory system: Secondary | ICD-10-CM | POA: Diagnosis not present

## 2021-05-06 DIAGNOSIS — I110 Hypertensive heart disease with heart failure: Secondary | ICD-10-CM | POA: Diagnosis not present

## 2021-05-06 DIAGNOSIS — Z09 Encounter for follow-up examination after completed treatment for conditions other than malignant neoplasm: Secondary | ICD-10-CM | POA: Diagnosis not present

## 2021-05-06 DIAGNOSIS — R0600 Dyspnea, unspecified: Secondary | ICD-10-CM | POA: Diagnosis not present

## 2021-05-06 DIAGNOSIS — I428 Other cardiomyopathies: Secondary | ICD-10-CM | POA: Diagnosis not present

## 2021-05-06 DIAGNOSIS — R5383 Other fatigue: Secondary | ICD-10-CM | POA: Diagnosis not present

## 2021-05-06 DIAGNOSIS — I739 Peripheral vascular disease, unspecified: Secondary | ICD-10-CM

## 2021-05-06 DIAGNOSIS — G588 Other specified mononeuropathies: Secondary | ICD-10-CM | POA: Diagnosis not present

## 2021-05-06 DIAGNOSIS — I10 Essential (primary) hypertension: Secondary | ICD-10-CM

## 2021-05-06 DIAGNOSIS — I480 Paroxysmal atrial fibrillation: Secondary | ICD-10-CM | POA: Diagnosis not present

## 2021-05-06 DIAGNOSIS — I493 Ventricular premature depolarization: Secondary | ICD-10-CM | POA: Diagnosis not present

## 2021-05-06 DIAGNOSIS — G629 Polyneuropathy, unspecified: Secondary | ICD-10-CM | POA: Insufficient documentation

## 2021-05-06 DIAGNOSIS — Z7901 Long term (current) use of anticoagulants: Secondary | ICD-10-CM | POA: Insufficient documentation

## 2021-05-06 DIAGNOSIS — I4892 Unspecified atrial flutter: Secondary | ICD-10-CM | POA: Insufficient documentation

## 2021-05-06 DIAGNOSIS — I5022 Chronic systolic (congestive) heart failure: Secondary | ICD-10-CM | POA: Diagnosis not present

## 2021-05-06 DIAGNOSIS — R002 Palpitations: Secondary | ICD-10-CM | POA: Insufficient documentation

## 2021-05-06 DIAGNOSIS — Z79899 Other long term (current) drug therapy: Secondary | ICD-10-CM | POA: Diagnosis not present

## 2021-05-06 DIAGNOSIS — E785 Hyperlipidemia, unspecified: Secondary | ICD-10-CM | POA: Diagnosis not present

## 2021-05-06 DIAGNOSIS — Z8616 Personal history of COVID-19: Secondary | ICD-10-CM | POA: Insufficient documentation

## 2021-05-06 LAB — BASIC METABOLIC PANEL
Anion gap: 7 (ref 5–15)
BUN: 17 mg/dL (ref 8–23)
CO2: 26 mmol/L (ref 22–32)
Calcium: 9.3 mg/dL (ref 8.9–10.3)
Chloride: 105 mmol/L (ref 98–111)
Creatinine, Ser: 1.13 mg/dL — ABNORMAL HIGH (ref 0.44–1.00)
GFR, Estimated: 47 mL/min — ABNORMAL LOW (ref >60)
Glucose, Bld: 99 mg/dL (ref 70–99)
Potassium: 4.3 mmol/L (ref 3.5–5.1)
Sodium: 138 mmol/L (ref 135–145)

## 2021-05-06 LAB — LIPID PANEL
Cholesterol: 160 mg/dL (ref 0–200)
HDL: 41 mg/dL (ref >40)
LDL Cholesterol: 81 mg/dL (ref 0–99)
Total CHOL/HDL Ratio: 3.9 RATIO
Triglycerides: 192 mg/dL — ABNORMAL HIGH (ref <150)
VLDL: 38 mg/dL (ref 0–40)

## 2021-05-06 NOTE — Patient Instructions (Signed)
Restart Entresto 24/26 mg Twice daily, PLEASE BRING [place] YOUR PROOF OF INCOME SO WE CAN SUBMIT YOUR APPLICATION FOR ASSISTANCE ? ?Labs done today, your results will be available in MyChart, we will contact you for abnormal readings. ? ?Your physician recommends that you schedule a follow-up appointment in: 6 months, **PLEASE CALL OUR OFFICE IN July TO SCHEDULE THIS APPOINTMENT ? ?If you have any questions or concerns before your next appointment please send [place] a message through [place] or call our office at [phone].   ? ?TO LEAVE A MESSAGE FOR THE NURSE SELECT OPTION 2, PLEASE LEAVE A MESSAGE INCLUDING: ?YOUR NAME ?DATE OF BIRTH ?CALL BACK NUMBER ?REASON FOR CALL**this is important as we prioritize the call backs ? ?YOU WILL RECEIVE A CALL BACK THE SAME DAY AS LONG AS YOU CALL BEFORE 4:00 PM ? ?At the [facility], you and your health needs are our priority. As part of our continuing mission to provide you with exceptional heart care, we have created designated Provider Care Teams. These Care Teams include your primary Cardiologist (physician) and Advanced Practice Providers (APPs- Physician Assistants and Nurse Practitioners) who all work together to provide you with the care you need, when you need it.  ? ?You may see any of the following providers on your designated Care Team at your next follow up: ?Dr Glori Bickers ?Dr Loralie Champagne ?Darrick Grinder, NP ?Lyda Jester, PA ?Jessica Milford,NP ?Marlyce Huge, PA ?Audry Riles, PharmD ? ? ?Please be sure to bring in all your medications bottles to every appointment.  ? ? ?

## 2021-05-06 NOTE — Progress Notes (Signed)
Pt brought in [employer] pt assist application, advised pt to get [place] her POI and we can fax it in for her, pt given samples for now. ? ?Medication Samples have been provided to the patient. ? ?Drug name: Entresto       Strength: 24/'26mg'$         Qty: 2  LOT: [id number]  Exp.Date: 4/25 ? ?Dosing instructions: Take 1 tab Twice daily  ? ?The patient has been instructed regarding the correct time, dose, and frequency of taking this medication, including desired effects and most common side effects.  ? ?Heather Smith ?3:14 PM ?05/06/2021 ? ?

## 2021-05-07 ENCOUNTER — Telehealth (HOSPITAL_COMMUNITY): Payer: Self-pay | Admitting: Pharmacy Technician

## 2021-05-07 NOTE — Telephone Encounter (Signed)
Advanced Heart Failure Patient Advocate Encounter ? ?Received message that patient wanted help renewing her Delene Loll assistance with [employer]. There is a grant open right now, would seek grant approval before being able to apply for [employer].  ? ?Called and left the patient a message.  ? ?Charlann Boxer, CPhT ? ?

## 2021-05-08 ENCOUNTER — Telehealth (HOSPITAL_COMMUNITY): Payer: Self-pay | Admitting: Pharmacy Technician

## 2021-05-08 ENCOUNTER — Other Ambulatory Visit (HOSPITAL_COMMUNITY): Payer: Self-pay | Admitting: *Deleted

## 2021-05-08 ENCOUNTER — Other Ambulatory Visit (HOSPITAL_COMMUNITY): Payer: Self-pay

## 2021-05-08 MED ORDER — ENTRESTO 24-26 MG PO TABS: 1.0000 | ORAL_TABLET | Freq: Two times a day (BID) | ORAL | 3 refills | Status: DC

## 2021-05-08 NOTE — Telephone Encounter (Signed)
Advanced Heart Failure Patient Advocate Encounter ? ?Called and spoke with the patient regarding Entresto renewal. She is aware that we will not seek renewal at this time, as a grant is open. The patient was approved for a [employer] grant that will help cover the cost of Entresto. Total amount awarded, $10,000. Eligibility, 04/08/21 - 04/08/22. ? ?ID [id number] ? ?BIN [id number] ? ?PCN [id number] ? ?Group [id number] ? ?Emailed the patient a copy of the grant information. Sent 90 day RX request to [employer] Investment banker, corporate) to send to [place] with the attached grant information.  ? ?Charlann Boxer, CPhT ? ?

## 2021-05-20 ENCOUNTER — Telehealth (HOSPITAL_COMMUNITY): Payer: Self-pay | Admitting: Pharmacy Technician

## 2021-05-20 ENCOUNTER — Other Ambulatory Visit: Payer: Self-pay | Admitting: Cardiology

## 2021-05-20 NOTE — Telephone Encounter (Signed)
Advanced Heart Failure Patient Advocate Encounter ? ?Patient called and left a message regarding her [employer]. Called and spoke with the patient. She stated she did not get the email that was sent on 4/6 with the grant information. ? ?Resent email. Advised patient to call back if she does not receive it and to check her spam folder.  ? ?Charlann Boxer, CPhT ? ?

## 2021-06-02 ENCOUNTER — Other Ambulatory Visit (HOSPITAL_COMMUNITY): Payer: Self-pay | Admitting: Cardiology

## 2021-06-10 ENCOUNTER — Other Ambulatory Visit (HOSPITAL_COMMUNITY): Payer: Self-pay | Admitting: Cardiology

## 2021-06-10 ENCOUNTER — Other Ambulatory Visit: Payer: Self-pay | Admitting: Internal Medicine

## 2021-06-12 DIAGNOSIS — I5022 Chronic systolic (congestive) heart failure: Secondary | ICD-10-CM | POA: Diagnosis not present

## 2021-06-12 DIAGNOSIS — I10 Essential (primary) hypertension: Secondary | ICD-10-CM | POA: Diagnosis not present

## 2021-06-12 DIAGNOSIS — R69 Illness, unspecified: Secondary | ICD-10-CM | POA: Diagnosis not present

## 2021-06-12 DIAGNOSIS — E039 Hypothyroidism, unspecified: Secondary | ICD-10-CM | POA: Diagnosis not present

## 2021-06-12 DIAGNOSIS — E782 Mixed hyperlipidemia: Secondary | ICD-10-CM | POA: Diagnosis not present

## 2021-06-19 ENCOUNTER — Ambulatory Visit (INDEPENDENT_AMBULATORY_CARE_PROVIDER_SITE_OTHER): Payer: Medicare HMO

## 2021-06-19 DIAGNOSIS — I428 Other cardiomyopathies: Secondary | ICD-10-CM

## 2021-06-20 LAB — CUP PACEART REMOTE DEVICE CHECK
Battery Remaining Longevity: 21 mo
Battery Voltage: 2.93 V
Brady Statistic AP VP Percent: 41.18 %
Brady Statistic AP VS Percent: 0.57 %
Brady Statistic AS VP Percent: 57.33 %
Brady Statistic AS VS Percent: 0.92 %
Brady Statistic RA Percent Paced: 41.64 %
Brady Statistic RV Percent Paced: 98.03 %
Date Time Interrogation Session: 20230518063623
HighPow Impedance: 71 Ohm
Implantable Lead Implant Date: 20150121
Implantable Lead Implant Date: 20150121
Implantable Lead Implant Date: 20150121
Implantable Lead Location: 753858
Implantable Lead Location: 753859
Implantable Lead Location: 753860
Implantable Lead Model: 4298
Implantable Lead Model: 5076
Implantable Pulse Generator Implant Date: 20200717
Lead Channel Impedance Value: 193.707
Lead Channel Impedance Value: 201.488
Lead Channel Impedance Value: 201.488
Lead Channel Impedance Value: 218.087
Lead Channel Impedance Value: 228 Ohm
Lead Channel Impedance Value: 361 Ohm
Lead Channel Impedance Value: 418 Ohm
Lead Channel Impedance Value: 418 Ohm
Lead Channel Impedance Value: 456 Ohm
Lead Channel Impedance Value: 456 Ohm
Lead Channel Impedance Value: 475 Ohm
Lead Channel Impedance Value: 551 Ohm
Lead Channel Impedance Value: 589 Ohm
Lead Channel Impedance Value: 665 Ohm
Lead Channel Impedance Value: 703 Ohm
Lead Channel Impedance Value: 722 Ohm
Lead Channel Impedance Value: 779 Ohm
Lead Channel Impedance Value: 779 Ohm
Lead Channel Pacing Threshold Amplitude: 0.5 V
Lead Channel Pacing Threshold Amplitude: 0.75 V
Lead Channel Pacing Threshold Amplitude: 1.875 V
Lead Channel Pacing Threshold Pulse Width: 0.4 ms
Lead Channel Pacing Threshold Pulse Width: 0.4 ms
Lead Channel Pacing Threshold Pulse Width: 1 ms
Lead Channel Sensing Intrinsic Amplitude: 18 mV
Lead Channel Sensing Intrinsic Amplitude: 18 mV
Lead Channel Sensing Intrinsic Amplitude: 3.375 mV
Lead Channel Sensing Intrinsic Amplitude: 3.375 mV
Lead Channel Setting Pacing Amplitude: 1.5 V
Lead Channel Setting Pacing Amplitude: 2 V
Lead Channel Setting Pacing Amplitude: 3 V
Lead Channel Setting Pacing Pulse Width: 0.4 ms
Lead Channel Setting Pacing Pulse Width: 1 ms
Lead Channel Setting Sensing Sensitivity: 0.3 mV

## 2021-06-27 NOTE — Progress Notes (Signed)
Remote ICD transmission.   

## 2021-08-04 DIAGNOSIS — H04123 Dry eye syndrome of bilateral lacrimal glands: Secondary | ICD-10-CM | POA: Diagnosis not present

## 2021-08-04 DIAGNOSIS — H02403 Unspecified ptosis of bilateral eyelids: Secondary | ICD-10-CM | POA: Diagnosis not present

## 2021-08-04 DIAGNOSIS — Z0100 Encounter for examination of eyes and vision without abnormal findings: Secondary | ICD-10-CM | POA: Diagnosis not present

## 2021-08-04 DIAGNOSIS — H2513 Age-related nuclear cataract, bilateral: Secondary | ICD-10-CM | POA: Diagnosis not present

## 2021-08-06 ENCOUNTER — Encounter: Payer: Self-pay | Admitting: Internal Medicine

## 2021-08-06 ENCOUNTER — Ambulatory Visit (INDEPENDENT_AMBULATORY_CARE_PROVIDER_SITE_OTHER): Payer: Medicare HMO | Admitting: Internal Medicine

## 2021-08-06 VITALS — BP 116/72 | HR 77 | Ht 67.0 in | Wt 189.0 lb

## 2021-08-06 DIAGNOSIS — Z95810 Presence of automatic (implantable) cardiac defibrillator: Secondary | ICD-10-CM

## 2021-08-06 DIAGNOSIS — I428 Other cardiomyopathies: Secondary | ICD-10-CM | POA: Diagnosis not present

## 2021-08-06 DIAGNOSIS — I493 Ventricular premature depolarization: Secondary | ICD-10-CM

## 2021-08-06 DIAGNOSIS — I5022 Chronic systolic (congestive) heart failure: Secondary | ICD-10-CM

## 2021-08-06 NOTE — Progress Notes (Signed)
/      Patient Care Team: Shirline Frees, MD as PCP - General   HPI  Heather Smith is a 88 y.o. female Seen in followup for CRT implantation for nonischemic cardiomyopathy and depressed LV function. This was accomplished fall 2014  change out 2020  Interval partial lead dislodgment noted after she saw Dr. Mariana Single in March and an x-ray was obtained and reviewed. This  x-ray was reviewed. We reprogrammed the device    I reviewed with her the data from her CPX 9/16 showed excellent functional capacity and her echo from last week demonstrated normal LV function  DATE TEST EF   12/14 Echo  20-25%   1/17    Echo   55-62 %   1/18    Echo  55-60 %   11/19 MYOVIEW 75% NO ischemia   1/20 Echo  50-55% 2 MR jets-overall mild  12/22 Echo   55-60%    Recent PYP scan inclusive because of neuropathy;   Her husband died about a month ago from melanoma diagnosed to death time about 5 weeks they have been married almost 10 years she met him when she was 89.  Chest pain.  Associated with recumbency irritation.  History of GERD.  Similar.  No exertional symptoms.  Date Cr K  8//18 1.07 4.8  10/19  1.11 4.8  1/22 1.18 4.4  4/23 1.13 4.3   DATE QRS Paced duration Flecainide  7/18 144    12/18 168 50  12/19 174 50  7/20 182 50  7/23 162 50      Past Medical History:  Diagnosis Date   Cardiac resynchronization therapy defibrillator (CRT-D) in place    Chronic kidney disease, stage 3a (HCC)    Chronic systolic CHF (congestive heart failure) (HCC)    Complication of anesthesia    difficult to wake up and blood pressure drops   GERD (gastroesophageal reflux disease)    Hypercholesterolemia    Hypertension    Hypothyroidism    Left bundle branch block    LV lead partial dislodgment 05/30/2013   Neurogenic claudication due to lumbar spinal stenosis    Neuropathy    Non-ischemic cardiomyopathy ([place])    LHC 10/04/12: Normal coronary arteries, EF 25-30% with global HK.  Echocardiogram  10/05/12: EF 30-35%, diffuse HK, moderate LAE. Echocardiogram (01/2013): Lateral to septal wall dyssynchrony EF 20-25%, diffuse HK   Osteoarthritis    "about all over" (02/22/2013)   PVC's (premature ventricular contractions)    Spinal stenosis    Vitamin D deficiency     Past Surgical History:  Procedure Laterality Date   APPENDECTOMY     BI-VENTRICULAR IMPLANTABLE CARDIOVERTER DEFIBRILLATOR N/A 02/22/2013   Procedure: BI-VENTRICULAR IMPLANTABLE CARDIOVERTER DEFIBRILLATOR  (CRT-D);  Surgeon: Deboraha Sprang, MD;  Location: [facility] CATH LAB;  Service: Cardiovascular;  Laterality: N/A;   BIV ICD GENERATOR CHANGEOUT N/A 08/19/2018   Procedure: BIV ICD GENERATOR CHANGEOUT;  Surgeon: Deboraha Sprang, MD;  Location: [place] [facility] CV LAB;  Service: Cardiovascular;  Laterality: N/A;   BLEPHAROPLASTY Bilateral    EXCISIONAL HEMORRHOIDECTOMY     FOOT ARTHRODESIS, MODIFIED MCBRIDE Bilateral    KNEE ARTHROSCOPY Right    LEFT HEART CATHETERIZATION WITH CORONARY ANGIOGRAM N/A 10/04/2012   Procedure: LEFT HEART CATHETERIZATION WITH CORONARY ANGIOGRAM;  Surgeon: Peter M Martinique, MD;  Location: [facility] CATH LAB;  Service: Cardiovascular;  Laterality: N/A;   LUMBAR LAMINECTOMY  11/03/2006    Bilateral 3, 4, 5 laminectomy, partial L2, decompression of  the thecal sac, foraminotomy, posterolateral arthrodesis L3-S1 with autograft   -- SURGEON:  Leeroy Cha, M.D.    TONSILLECTOMY     TOTAL KNEE ARTHROPLASTY Left 07/15/2015   Procedure: LEFT TOTAL KNEE ARTHROPLASTY;  Surgeon: Frederik Pear, MD;  Location: [place];  Service: Orthopedics;  Laterality: Left;   TUBAL LIGATION     VAGINAL HYSTERECTOMY      Current Outpatient Medications  Medication Sig Dispense Refill   acetaminophen (TYLENOL) 325 MG tablet Take 325 mg by mouth every 6 (six) hours as needed for moderate pain or headache.     Ascorbic Acid (VITAMIN C) 1000 MG tablet Take 1,000 mg by mouth daily. As needed     carvedilol (COREG) 3.125 MG tablet TAKE ONE TABLET BY  MOUTH AT BREAKFAST AND AT BEDTIME 180 tablet 3   Cholecalciferol (VITAMIN D-3) 125 MCG (5000 UT) TABS Take by mouth.     DULoxetine (CYMBALTA) 30 MG capsule Take 30 mg by mouth daily.      ezetimibe (ZETIA) 10 MG tablet Take 1 tablet by mouth once daily 90 tablet 3   flecainide (TAMBOCOR) 50 MG tablet TAKE ONE TABLET BY MOUTH EVERY MORNING and TAKE ONE TABLET BY MOUTH EVERYDAY AT BEDTIME 180 tablet 3   levothyroxine (SYNTHROID, LEVOTHROID) 50 MCG tablet Take 50 mcg by mouth daily.     Magnesium 250 MG TABS Take 250 mg by mouth daily.     multivitamin (THERAGRAN) per tablet Take 1 tablet by mouth daily.     pantoprazole (PROTONIX) 40 MG tablet Take 40 mg by mouth daily.     sacubitril-valsartan (ENTRESTO) 24-26 MG Take 1 tablet by mouth 2 (two) times daily. 180 tablet 3   spironolactone (ALDACTONE) 25 MG tablet Take 1 tablet (25 mg total) by mouth daily. 90 tablet 3   vitamin B-12 (CYANOCOBALAMIN) 1000 MCG tablet Take 1,000 mcg by mouth daily.     zinc gluconate 50 MG tablet Take 50 mg by mouth daily.     Current Facility-Administered Medications  Medication Dose Route Frequency Provider Last Rate Last Admin   triamcinolone acetonide (KENALOG) 10 MG/ML injection 10 mg  10 mg Other Once Regal, Norman S, DPM       triamcinolone acetonide (KENALOG) 10 MG/ML injection 10 mg  10 mg Other Once Regal, Norman S, DPM       triamcinolone acetonide (KENALOG) 10 MG/ML injection 10 mg  10 mg Other Once Regal, Norman S, DPM        Allergies  Allergen Reactions   Statins Other (See Comments)    "Makes her bones hurt and very sore"   Niaspan [Niacin Er] Other (See Comments)    High doses, causes hot flashes & aching    Simvastatin Other (See Comments)   Benadryl [Diphenhydramine] Other (See Comments)    Makes pt feel jittery    Dilaudid [Hydromorphone Hcl] Itching    Review of Systems negative except from HPI and PMH  Physical Exam BP 116/72   Pulse 77   Ht '5\' 7"'  (1.702 m)   Wt 189 lb (85.7 kg)    BMI 29.60 kg/m  .Well developed and well nourished in no acute distress HENT normal Neck supple with JVP-flat Clear Device pocket well healed; without hematoma or erythema.  There is no tethering  Regular rate and rhythm, no murmur Abd-soft with active BS No Clubbing cyanosis   edema Skin-warm and dry A & Oriented  Grossly normal sensory and motor function  ECG sinus P-synchronous/ AV  Pacing Upright QRS lead V1 negative QRS lead I        Assessment and  Plan  Nonischemic cardiomyopathy-- interval normalization  Congestive heart failure-chronic-diastolic    Medtronic ICD-CRT  T   Atrial fibrillation-paroxysmal  LV threshold change  Chronotropic incompetence  Depression   PVCs  Chest pain    Functional status is stable.  Continue on carvedilol 3.125 Entresto 24/26 and Aldactone 25.  Chest pain is likely GE reflux disease.  We will resume pantoprazole OTC 40 mg.  PVCs are relatively quiescient.  About 2.5%.  Continue flecainide 50 mg twice daily.  Device function is normal.  The reprogrammed delays to try to maximize longevity.  Functional status remains stable.  No recent assessment of LV function.  She has come off of her flecainide.  Her PVC burden is increased based on the device interrogation.  But only from less than 0.5%--2.2%.  These are always under counting; she would like to stay off of flecainide and so we will plan to reassess in 3 to 4 months time her PVC burden and we will continue her off flecainide in the interim.  Heart rate excursion is adequate.  Euvolemic so we will continue current medications.  No interval atrial fibrillation to speak of.  Mood is stable.

## 2021-08-06 NOTE — Patient Instructions (Signed)

## 2021-08-07 DIAGNOSIS — Z85828 Personal history of other malignant neoplasm of skin: Secondary | ICD-10-CM | POA: Diagnosis not present

## 2021-08-07 DIAGNOSIS — C44622 Squamous cell carcinoma of skin of right upper limb, including shoulder: Secondary | ICD-10-CM | POA: Diagnosis not present

## 2021-08-07 DIAGNOSIS — D485 Neoplasm of uncertain behavior of skin: Secondary | ICD-10-CM | POA: Diagnosis not present

## 2021-08-07 DIAGNOSIS — L570 Actinic keratosis: Secondary | ICD-10-CM | POA: Diagnosis not present

## 2021-08-07 DIAGNOSIS — L820 Inflamed seborrheic keratosis: Secondary | ICD-10-CM | POA: Diagnosis not present

## 2021-08-16 IMAGING — NM NM TUMOR LOCAL/TRACER DISTR WITH SPECT
4 series · 19 of 19 positions shown · non-contrast
Comparison: none

CLINICAL DATA: HEART FAILURE. CONCERN FOR CARDIAC AMYLOIDOSIS.
Chronic systolic heart failure. Cardiomyopathy. LEFT bundle branch
block

EXAM:
NUCLEAR MEDICINE TUMOR LOCALIZATION. PYP CARDIAC AMYLOIDOSIS SCAN
WITH SPECT
TECHNIQUE: Following intravenous administration of radiopharmaceutical,
anterior planar images of the chest were obtained. Regions of
interest were placed on the heart and contralateral chest wall for
quantitative assessment. Additional SPECT imaging of the chest was
obtained.
RADIOPHARMACEUTICALS:  Twenty-one mCi TECHNETIUM 99 PYROPHOSPHATE

[Series 1: amyloid spect_(id)_tra · 4.1mm · 4.14mm/px · 6 of 128 frames shown]
[frame 11/128]
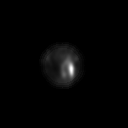
[frame 32/128]
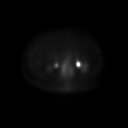
[frame 54/128]
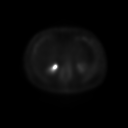
[frame 75/128]
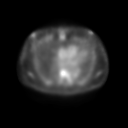
[frame 96/128]
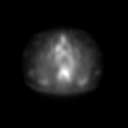
[frame 118/128]
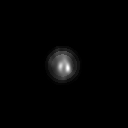

[Series 1: amyloid spect_(id)_cor · 4.1mm · 4.14mm/px · 6 of 128 frames shown]
[frame 11/128]
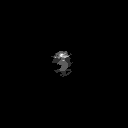
[frame 32/128]
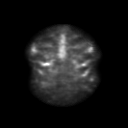
[frame 54/128]
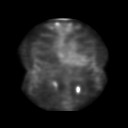
[frame 75/128]
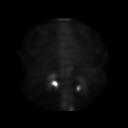
[frame 96/128]
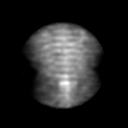
[frame 118/128]
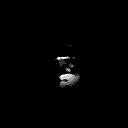

[Series 1: anterior · 4.14mm/px · 1 of 1 slices shown]
[im 1/1]
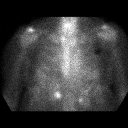

[Series 2: amyloid spect · 4.14mm/px · 6 of 64 frames shown]
[frame 6/64]
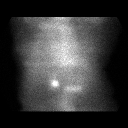
[frame 16/64]
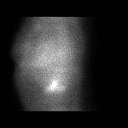
[frame 27/64]
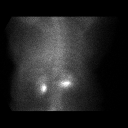
[frame 38/64]
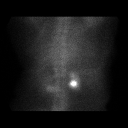
[frame 48/64]
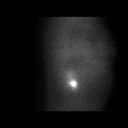
[frame 59/64]
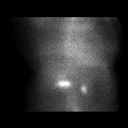

[19 of 19 positions shown; findings below may reference images not displayed]

FINDINGS: Planar Visual assessment:

Anterior planar imaging demonstrates radiotracer uptake within the
heart less than than uptake within the adjacent ribs (Grade 1).

Quantitative assessment :

Quantitative assessment of the cardiac uptake compared to the
contralateral chest wall is equal to (H/CL = 1.0).

SPECT assessment: SPECT imaging of the chest demonstrates minimal
radiotracer accumulation within the LEFT ventricle.
IMPRESSION: Visual and quantitative assessment (grade 1, H/CLL equal 1.0) are
not suggestive of transthyretin amyloidosis.

## 2021-09-15 DIAGNOSIS — G4700 Insomnia, unspecified: Secondary | ICD-10-CM | POA: Diagnosis not present

## 2021-09-15 DIAGNOSIS — N183 Chronic kidney disease, stage 3 unspecified: Secondary | ICD-10-CM | POA: Diagnosis not present

## 2021-09-15 DIAGNOSIS — F324 Major depressive disorder, single episode, in partial remission: Secondary | ICD-10-CM | POA: Diagnosis not present

## 2021-09-15 DIAGNOSIS — R69 Illness, unspecified: Secondary | ICD-10-CM | POA: Diagnosis not present

## 2021-09-15 DIAGNOSIS — K219 Gastro-esophageal reflux disease without esophagitis: Secondary | ICD-10-CM | POA: Diagnosis not present

## 2021-09-15 DIAGNOSIS — R7303 Prediabetes: Secondary | ICD-10-CM | POA: Diagnosis not present

## 2021-09-15 DIAGNOSIS — E039 Hypothyroidism, unspecified: Secondary | ICD-10-CM | POA: Diagnosis not present

## 2021-09-15 DIAGNOSIS — I5022 Chronic systolic (congestive) heart failure: Secondary | ICD-10-CM | POA: Diagnosis not present

## 2021-09-15 DIAGNOSIS — I10 Essential (primary) hypertension: Secondary | ICD-10-CM | POA: Diagnosis not present

## 2021-09-15 DIAGNOSIS — E782 Mixed hyperlipidemia: Secondary | ICD-10-CM | POA: Diagnosis not present

## 2021-09-15 DIAGNOSIS — Z95810 Presence of automatic (implantable) cardiac defibrillator: Secondary | ICD-10-CM | POA: Diagnosis not present

## 2021-09-15 DIAGNOSIS — M542 Cervicalgia: Secondary | ICD-10-CM | POA: Diagnosis not present

## 2021-09-18 ENCOUNTER — Ambulatory Visit (INDEPENDENT_AMBULATORY_CARE_PROVIDER_SITE_OTHER): Payer: Medicare HMO

## 2021-09-18 DIAGNOSIS — I428 Other cardiomyopathies: Secondary | ICD-10-CM

## 2021-09-18 LAB — CUP PACEART REMOTE DEVICE CHECK
Battery Remaining Longevity: 20 mo
Battery Voltage: 2.94 V
Brady Statistic AP VP Percent: 32.17 %
Brady Statistic AP VS Percent: 0.62 %
Brady Statistic AS VP Percent: 65.86 %
Brady Statistic AS VS Percent: 1.36 %
Brady Statistic RA Percent Paced: 32.56 %
Brady Statistic RV Percent Paced: 97.25 %
Date Time Interrogation Session: 20230817052724
HighPow Impedance: 71 Ohm
Implantable Lead Implant Date: 20150121
Implantable Lead Implant Date: 20150121
Implantable Lead Implant Date: 20150121
Implantable Lead Location: 753858
Implantable Lead Location: 753859
Implantable Lead Location: 753860
Implantable Lead Model: 4298
Implantable Lead Model: 5076
Implantable Pulse Generator Implant Date: 20200717
Lead Channel Impedance Value: 193.707
Lead Channel Impedance Value: 201.488
Lead Channel Impedance Value: 205.114
Lead Channel Impedance Value: 218.087
Lead Channel Impedance Value: 232.653
Lead Channel Impedance Value: 361 Ohm
Lead Channel Impedance Value: 418 Ohm
Lead Channel Impedance Value: 418 Ohm
Lead Channel Impedance Value: 456 Ohm
Lead Channel Impedance Value: 475 Ohm
Lead Channel Impedance Value: 513 Ohm
Lead Channel Impedance Value: 589 Ohm
Lead Channel Impedance Value: 608 Ohm
Lead Channel Impedance Value: 646 Ohm
Lead Channel Impedance Value: 665 Ohm
Lead Channel Impedance Value: 703 Ohm
Lead Channel Impedance Value: 703 Ohm
Lead Channel Impedance Value: 760 Ohm
Lead Channel Pacing Threshold Amplitude: 0.5 V
Lead Channel Pacing Threshold Amplitude: 0.625 V
Lead Channel Pacing Threshold Amplitude: 1.875 V
Lead Channel Pacing Threshold Pulse Width: 0.4 ms
Lead Channel Pacing Threshold Pulse Width: 0.4 ms
Lead Channel Pacing Threshold Pulse Width: 1 ms
Lead Channel Sensing Intrinsic Amplitude: 17.75 mV
Lead Channel Sensing Intrinsic Amplitude: 17.75 mV
Lead Channel Sensing Intrinsic Amplitude: 3.25 mV
Lead Channel Sensing Intrinsic Amplitude: 3.25 mV
Lead Channel Setting Pacing Amplitude: 1.5 V
Lead Channel Setting Pacing Amplitude: 2 V
Lead Channel Setting Pacing Amplitude: 2 V
Lead Channel Setting Pacing Pulse Width: 0.4 ms
Lead Channel Setting Pacing Pulse Width: 1 ms
Lead Channel Setting Sensing Sensitivity: 0.3 mV

## 2021-10-16 NOTE — Progress Notes (Signed)
Remote ICD transmission.   

## 2021-11-03 DIAGNOSIS — C44722 Squamous cell carcinoma of skin of right lower limb, including hip: Secondary | ICD-10-CM | POA: Diagnosis not present

## 2021-11-03 DIAGNOSIS — Z85828 Personal history of other malignant neoplasm of skin: Secondary | ICD-10-CM | POA: Diagnosis not present

## 2021-11-17 DIAGNOSIS — F5101 Primary insomnia: Secondary | ICD-10-CM | POA: Diagnosis not present

## 2021-11-17 DIAGNOSIS — N183 Chronic kidney disease, stage 3 unspecified: Secondary | ICD-10-CM | POA: Diagnosis not present

## 2021-11-17 DIAGNOSIS — F324 Major depressive disorder, single episode, in partial remission: Secondary | ICD-10-CM | POA: Diagnosis not present

## 2021-11-17 DIAGNOSIS — K219 Gastro-esophageal reflux disease without esophagitis: Secondary | ICD-10-CM | POA: Diagnosis not present

## 2021-11-17 DIAGNOSIS — I10 Essential (primary) hypertension: Secondary | ICD-10-CM | POA: Diagnosis not present

## 2021-11-17 DIAGNOSIS — E782 Mixed hyperlipidemia: Secondary | ICD-10-CM | POA: Diagnosis not present

## 2021-11-17 DIAGNOSIS — E039 Hypothyroidism, unspecified: Secondary | ICD-10-CM | POA: Diagnosis not present

## 2021-11-17 DIAGNOSIS — R69 Illness, unspecified: Secondary | ICD-10-CM | POA: Diagnosis not present

## 2021-11-17 DIAGNOSIS — Z23 Encounter for immunization: Secondary | ICD-10-CM | POA: Diagnosis not present

## 2021-11-20 ENCOUNTER — Ambulatory Visit (HOSPITAL_COMMUNITY)
Admission: RE | Admit: 2021-11-20 | Discharge: 2021-11-20 | Disposition: A | Discharge status: Home / Self Care | Payer: Medicare HMO | Source: Ambulatory Visit | Attending: Cardiology | Admitting: Cardiology

## 2021-11-20 ENCOUNTER — Encounter (HOSPITAL_COMMUNITY): Payer: Self-pay | Admitting: Cardiology

## 2021-11-20 VITALS — BP 118/68 | HR 75 | Wt 192.6 lb

## 2021-11-20 DIAGNOSIS — I110 Hypertensive heart disease with heart failure: Secondary | ICD-10-CM | POA: Diagnosis not present

## 2021-11-20 DIAGNOSIS — I428 Other cardiomyopathies: Secondary | ICD-10-CM | POA: Insufficient documentation

## 2021-11-20 DIAGNOSIS — I493 Ventricular premature depolarization: Secondary | ICD-10-CM | POA: Insufficient documentation

## 2021-11-20 DIAGNOSIS — R5383 Other fatigue: Secondary | ICD-10-CM | POA: Insufficient documentation

## 2021-11-20 DIAGNOSIS — E785 Hyperlipidemia, unspecified: Secondary | ICD-10-CM | POA: Insufficient documentation

## 2021-11-20 DIAGNOSIS — I4892 Unspecified atrial flutter: Secondary | ICD-10-CM | POA: Insufficient documentation

## 2021-11-20 DIAGNOSIS — R002 Palpitations: Secondary | ICD-10-CM | POA: Insufficient documentation

## 2021-11-20 DIAGNOSIS — Z79899 Other long term (current) drug therapy: Secondary | ICD-10-CM | POA: Diagnosis not present

## 2021-11-20 DIAGNOSIS — I5032 Chronic diastolic (congestive) heart failure: Secondary | ICD-10-CM

## 2021-11-20 DIAGNOSIS — I5022 Chronic systolic (congestive) heart failure: Secondary | ICD-10-CM | POA: Insufficient documentation

## 2021-11-20 DIAGNOSIS — R42 Dizziness and giddiness: Secondary | ICD-10-CM | POA: Diagnosis not present

## 2021-11-20 DIAGNOSIS — G629 Polyneuropathy, unspecified: Secondary | ICD-10-CM | POA: Diagnosis not present

## 2021-11-20 DIAGNOSIS — R0609 Other forms of dyspnea: Secondary | ICD-10-CM | POA: Diagnosis not present

## 2021-11-20 DIAGNOSIS — E782 Mixed hyperlipidemia: Secondary | ICD-10-CM

## 2021-11-20 LAB — LIPID PANEL
Cholesterol: 155 mg/dL (ref 0–200)
HDL: 38 mg/dL — ABNORMAL LOW (ref >40)
LDL Cholesterol: 74 mg/dL (ref 0–99)
Total CHOL/HDL Ratio: 4.1 RATIO
Triglycerides: 214 mg/dL — ABNORMAL HIGH (ref <150)
VLDL: 43 mg/dL — ABNORMAL HIGH (ref 0–40)

## 2021-11-20 LAB — CBC
HCT: 42.5 % (ref 36.0–46.0)
Hemoglobin: 14.4 g/dL (ref 12.0–15.0)
MCH: 31.2 pg (ref 26.0–34.0)
MCHC: 33.9 g/dL (ref 30.0–36.0)
MCV: 92.2 fL (ref 80.0–100.0)
Platelets: 168 10*3/uL (ref 150–400)
RBC: 4.61 MIL/uL (ref 3.87–5.11)
RDW: 13.1 % (ref 11.5–15.5)
WBC: 6.8 10*3/uL (ref 4.0–10.5)
nRBC: 0 % (ref 0.0–0.2)

## 2021-11-20 LAB — COMPREHENSIVE METABOLIC PANEL
ALT: 17 U/L (ref 0–44)
AST: 18 U/L (ref 15–41)
Albumin: 4.1 g/dL (ref 3.5–5.0)
Alkaline Phosphatase: 67 U/L (ref 38–126)
Anion gap: 11 (ref 5–15)
BUN: 17 mg/dL (ref 8–23)
CO2: 28 mmol/L (ref 22–32)
Calcium: 9.8 mg/dL (ref 8.9–10.3)
Chloride: 103 mmol/L (ref 98–111)
Creatinine, Ser: 1.17 mg/dL — ABNORMAL HIGH (ref 0.44–1.00)
GFR, Estimated: 45 mL/min — ABNORMAL LOW (ref >60)
Glucose, Bld: 114 mg/dL — ABNORMAL HIGH (ref 70–99)
Potassium: 4.7 mmol/L (ref 3.5–5.1)
Sodium: 142 mmol/L (ref 135–145)
Total Bilirubin: 0.8 mg/dL (ref 0.3–1.2)
Total Protein: 7.1 g/dL (ref 6.5–8.1)

## 2021-11-20 LAB — BRAIN NATRIURETIC PEPTIDE: B Natriuretic Peptide: 13.7 pg/mL (ref 0.0–100.0)

## 2021-11-20 NOTE — Progress Notes (Signed)
Patient ID: Heather Smith, female   DOB: 12-26-1932, 88 y.o.   MRN: [id number] PCP: Dr. Kenton Kingfisher HF Cardiology: Aundra Dubin EP: Dr. Caryl Comes   88 y.o. with history of nonischemic cardiomyopathy and chronic systolic HF.  In 9/14, she was admitted to [facility] with chest pain.  Cardiac cath was done, showing no significant coronary disease.  However, echo showed EF 30-35%.  She was started on meds for CHF, and echo was repeated in 12/14, EF was read as 20-25%.  She had a Medtonic CRT-D device placed (had baseline LBBB).  In 3/15, the LV lead was not capturing properly so device had to be reprogrammed.  She had AV optimization in 5/15.  Limited echo at that time showed improvement in EF to 40-45%.  CPX in 5/15 was submaximal but suggested low normal to mildly reduced functional capacity. Echo in 6/16 showed EF 35-40% with diffuse hypokinesis.  CPX was done in 9/16.  This actually showed good functional capacity. She had repeat echo in 1/17, showing EF increased to 60-65%.  Most recent echo in 8/18 showed EF 50-55% with mild LV dilation and mild MR.    She was seen by Dr. Caryl Comes for frequent PVCs (symptomatic).  She was started on flecainide.  She thinks that this has helped .   Atypical chest pain in 11/19, Cardiolite was normal.   Repeat echo in 1/20 showed EF 50-55%, moderate MR.    PYP scan in 1/21 was not suggestive of TTR amyloidosis.  Echo in 5/21 showed EF 50-55%, normal RV, mild MR.   She was admitted 12/22 with near syncope and AKI due to hypotension in setting of COVID-19 infection. SCr 1.5 on admit (baseline ~1.1). Also had brief episodes of atrial flutter, captured on device interrogation (longest duration 8 min). Head CT was negative. She was treated by general cardiology. Per documentation, there was no evidence of  acute HF decompensation/hypervolemia. Coreg, Entresto and diuretics were initially held. Gentle IVF hydration administered. AKI resolved and BP improved. She had no further AFL/SVT.   Echo showed EF 55-60%. RV ok. No significant valvular dysfunction. HF meds were reintroduced prior to d/c, lasix PRN. She completed antiviral Molpunavir for COVID. Scr 1.07 at discharge.   Today she returns for routine 6 month f/u. Here w/ daughter. Reports has been more depressed lately, after husband died in the spring. Also reports nocturnal chest discomfort, seems likely acid reflux. Relieved w/ belching and antacids. No exertional CP but reports exertional dyspnea, NYHA Class II-early III. Device interrogation shows good impedence, index < threshold. No AT/AF. No VT/VF. However, effective CRT pacing is < 90% (79.2%). Today's EKG shows atrial sensed v-paced rhythm, 75 bpm. No PVCs. BP stable, 118/68. Reports full compliance w/ meds. Some occasional positional dizziness if she stands too quickly. Seen by PCP earlier this week and CP symptoms also felt likely due to GI etiology. Given Rx for Protonix w/ plans to refer to GI if symptoms persists.    ECG (personally reviewed): Atrial-sensed v-paced 75  Medtronic device interrogation: Fluid index < threshold, <90% BiV pacing (only 79.2 % effective BiV pacing), no AF, no VT (personally reviewed).  Labs (9/14): TSH normal Labs (3/15): K 4.7, creatinine 1.1 Labs (5/15): K 4.9, creatinine 1.1, BNP 44 Labs (6/15): K 5.1, creatinine 1.4, pro-BNP 39 Labs (6/15): K 5.3, creatinine 1.53 Labs (12/15): K 4.4, creatinine 1.03 Labs (5/16): K 4, creatinine 1.0, BNP 34.6 Labs (6/16): K 4.3, creatinine 1.1 Labs (9/16): K 4.4, creatinine 1.16 Labs (11/16):  K 4.5, creatinine 1.16 Labs (12/16): K 4.6, creatinine 1.16, BNP 22, Mg 1.9 Labs (1/17): K 4.3, creatinine 1.34, HCT 42.4, ESR normal Labs (6/17): K 4.6, creatinine 1.23, hgb 9.9, TSH normal Labs (6/18): K 4.7, creatinine 1.09, BNP 94, hgb 13.8, TSH normal Labs (12/18): K 4.7, creatinine 0.98 Labs (10/19): BNP 25, K 4.8, creatinine 1.11 Labs (7/20): K 5, creatinine 1.17 Labs (9/20): K 4.9, creatinine  1.37 Labs (4/21): TSH normal, hgb 13.5, K 4.3, creatinine 1.3 Labs (1/22): K 4.4, creatinine 1.18 Lab (12/22): K 3.6, creatinine 1.07  Labs (4/23): K 4.3, creatinine 1.13, LDL 81   PMH: 1. Chronic systolic CHF: Nonischemic cardiomyopathy.  LHC (9/14) with no significant coronary disease, EF 25-30% with global hypokinesis.  Echo (9/14) with EF 30-35%, moderate LV dilation, diffuse hypokinesis.  Echo (12/14) with EF 20-25%.  She does not drink ETOH.  She had Medtronic CRT-D device implantation in 1/15. Echo (5/15) with EF 40-45%, mild LVH.  CPX (5/15) was submaximal with RER 0.99, peak VO2 14, VE/VCO2 37.3; low normal functional capacity may be limited by loss of BiV pacing at higher HR and by deconditioning but interpretation somewhat limited since study was submaximal.  Echo (6/16) with EF 35-40%, severe LV dilation, diffuse hypokinesis, mild MR.  CPX (9/16) with peak VO2 17.7, VE/VCO2 slope 32.3, RER 1.11 => excellent functional capacity. Echo (11/17) with EF 60-65%.  - Echo (8/18): EF 50-55%, mildly dilated LV, mild MR.  - Echo (1/20): EF 50-55%, moderate MR.  - PYP scan (1/21): H/CL 1.0, grade 1.  Not suggestive of TTR amyloidosis.  - Echo (5/21): EF 50-55%, normal RV, mild MR. 2. Back pain s/p lumbar laminectomy 3. OA: Knees. Left TKR in 6/17.  4. Hyperlipidemia 5. Depression 6. PVCs 7. Peripheral neuropathy: Uncertain etiology.  She is not a diabetic.  8. Lung nodule: Last CT chest in 1/19 showed stable 6 mm LLL nodule, no further followup recommended.  9. Chest pain: Cardiolite (11/19) with EF 74%, no evidence for ischemia/infarction.   SH: Lives [place], married, never smoked, no ETOH.    FH: Mother with MI at 15, possible ischemic cardiomyopathy.  Father with cardiomyopathy, died at 55.  She has 5 brothers, none with significant coronary disease.    ROS: All systems reviewed and negative except as per HPI.   Current Outpatient Medications  Medication Sig Dispense Refill    acetaminophen (TYLENOL) 325 MG tablet Take 325 mg by mouth every 6 (six) hours as needed for moderate pain or headache.     Ascorbic Acid (VITAMIN C) 1000 MG tablet Take 1,000 mg by mouth daily.     carvedilol (COREG) 3.125 MG tablet TAKE ONE TABLET BY MOUTH AT BREAKFAST AND AT BEDTIME 180 tablet 3   Cholecalciferol (VITAMIN D-3) 125 MCG (5000 UT) TABS Take by mouth.     DULoxetine (CYMBALTA) 30 MG capsule Take 30 mg by mouth daily.      ezetimibe (ZETIA) 10 MG tablet Take 1 tablet by mouth once daily 90 tablet 3   flecainide (TAMBOCOR) 50 MG tablet TAKE ONE TABLET BY MOUTH EVERY MORNING and TAKE ONE TABLET BY MOUTH EVERYDAY AT BEDTIME 180 tablet 3   levothyroxine (SYNTHROID, LEVOTHROID) 50 MCG tablet Take 50 mcg by mouth daily.     Magnesium 250 MG TABS Take 250 mg by mouth daily.     multivitamin (THERAGRAN) per tablet Take 1 tablet by mouth daily.     pantoprazole (PROTONIX) 40 MG tablet Take 40 mg by  mouth 2 (two) times daily.     sacubitril-valsartan (ENTRESTO) 24-26 MG Take 1 tablet by mouth 2 (two) times daily. 180 tablet 3   spironolactone (ALDACTONE) 25 MG tablet Take 1 tablet (25 mg total) by mouth daily. 90 tablet 3   vitamin B-12 (CYANOCOBALAMIN) 1000 MCG tablet Take 1,000 mcg by mouth daily.     zinc gluconate 50 MG tablet Take 50 mg by mouth daily.     Current Facility-Administered Medications  Medication Dose Route Frequency Provider Last Rate Last Admin   triamcinolone acetonide (KENALOG) 10 MG/ML injection 10 mg  10 mg Other Once Regal, Norman S, DPM       triamcinolone acetonide (KENALOG) 10 MG/ML injection 10 mg  10 mg Other Once Regal, Norman S, DPM       triamcinolone acetonide (KENALOG) 10 MG/ML injection 10 mg  10 mg Other Once Regal, Norman S, DPM       BP 118/68   Pulse 75   Wt 87.4 kg (192 lb 9.6 oz)   SpO2 95%   BMI 30.17 kg/m   Wt Readings from Last 3 Encounters:  11/20/21 87.4 kg (192 lb 9.6 oz)  08/06/21 85.7 kg (189 lb)  05/06/21 84.9 kg (187 lb 3.2 oz)    Today's Vitals   11/20/21 1342  BP: 118/68  Pulse: 75  SpO2: 95%  Weight: 87.4 kg (192 lb 9.6 oz)   Body mass index is 30.17 kg/m.  PHYSICAL EXAM: General:  Well appearing. No respiratory difficulty HEENT: normal Neck: supple. no JVD. Carotids 2+ bilat; no bruits. No lymphadenopathy or thyromegaly appreciated. Cor: PMI nondisplaced. Regular rate & rhythm. No rubs, gallops or murmurs. Lungs: clear Abdomen: soft, nontender, nondistended. No hepatosplenomegaly. No bruits or masses. Good bowel sounds. Extremities: no cyanosis, clubbing, rash, edema Neuro: alert & oriented x 3, cranial nerves grossly intact. moves all 4 extremities w/o difficulty. Affect pleasant.   Assessment/Plan: 1. Chronic systolic CHF: Nonischemic cardiomyopathy s/p Medtronic CRT-D. EF 35-40% (6/16). She does have a family history of cardiomyopathy of uncertain etiology (father) but none of her siblings developed a cardiomyopathy.  Familial cardiomyopathy is a consideration.  Viral myocarditis is also a possible etiology.  Echo in 1/17 showed improvement in EF to 60-65%, echo in 8/18 showed EF 50-55% and echo in 1/20 showed stable EF 50-55%.  PYP scan not suggestive of TTR amyloidosis. Echo in 5/21 showed EF 50-55%, normal RV, mild MR. Echo 12/22 EF 55-60%, RV ok. Reports slight increase in exertional dyspnea, now NYHA II-early III. She continues with persistent fatigue. She is not volume overloaded on exam or by device. Optivol fluid index/ impedence is good. However only BiV paced 79% on the time. Suspect ineffective pacing contributing to symptoms - Update echo to reassess LVEF given reduction in effective BiV pacing. Refer to EP clinic for adjustment of device settings - Continue Entresto 24-26 mg bid. Avoiding dose increase given age and occasional positional dizziness  - Continue spiro 25 mg daily  - Continue Coreg 3.125 mg bid  - Lasix 20 mg PRN.  - Check BMP/BNP today  - Has CRT-D device. Refer back to EP for  device adjustment given drop in BiV pacing  2. PVCs: Followed by EP, now on flecainide. Minimal palpitations now. No PVCs on EKG today   - Continue flecainide 50 mg bid.  Will need nodal blocking agent while on flecainide, she is on Coreg.  3. Peripheral neuropathy: Bothersome peripheral neuropathy. PYP scan not suggestive of TTR amyloidosis.  4. Fatigue: Chronic, has been present for years.  5. HTN: Controlled on current regimen.  - check BMP today  6. PAD: Abnormal ABIs on insurance screening.  She has peripheral neuropathy symptoms but no definite claudication. LE arterial dopplers 10/22 w/ normal ABIs  7. HLD: LP 14/23 w/ elevated LDL, 81 mg/dL. Goal LDL < 70. On Zetia 10 mg daily (cannot take statins).  - Repeat LP. If LDL not < 70 on followup lipid profile, refer for Repatha.  8. Atrial Flutter: No further AT/AF detected on today's interrogation. No indication for a/c. Device followed in EP device clinic   F/u w/ APP after echo in 3-4 wks. Needs f/u in EP clinic for device check/ optimization of CRT-D. Will route note to Dr. Caryl Comes.                                           Lyda Jester, PA-C  11/20/2021

## 2021-11-20 NOTE — Patient Instructions (Signed)
There has been no changes to your medications.  Labs done today, your results will be available in MyChart, we will contact you for abnormal readings.  Please call Dr. Lequita Halt to arrange a follow up appointment.  Your physician has requested that you have an echocardiogram. Echocardiography is a painless test that uses sound waves to create images of your heart. It provides your doctor with information about the size and shape of your heart and how well your heart's chambers and valves are working. This procedure takes approximately one hour. There are no restrictions for this procedure. Please do NOT wear cologne, perfume, aftershave, or lotions (deodorant is allowed). Please arrive 15 minutes prior to your appointment time.  Your physician recommends that you schedule a follow-up appointment in: 3 weeks  If you have any questions or concerns before your next appointment please send [place] a message through [place] or call our office at [phone].    TO LEAVE A MESSAGE FOR THE NURSE SELECT OPTION 2, PLEASE LEAVE A MESSAGE INCLUDING: YOUR NAME DATE OF BIRTH CALL BACK NUMBER REASON FOR CALL**this is important as we prioritize the call backs  YOU WILL RECEIVE A CALL BACK THE SAME DAY AS LONG AS YOU CALL BEFORE 4:00 PM  At the [facility], you and your health needs are our priority. As part of our continuing mission to provide you with exceptional heart care, we have created designated Provider Care Teams. These Care Teams include your primary Cardiologist (physician) and Advanced Practice Providers (APPs- Physician Assistants and Nurse Practitioners) who all work together to provide you with the care you need, when you need it.   You may see any of the following providers on your designated Care Team at your next follow up: Dr Glori Bickers Dr Loralie Champagne Dr. Roxana Hires, NP Lyda Jester, [place] [facility], [place] Forestine Na,  NP Audry Riles, PharmD   Please be sure to bring in all your medications bottles to every appointment.

## 2021-11-21 ENCOUNTER — Telehealth: Payer: Self-pay | Admitting: Internal Medicine

## 2021-11-21 NOTE — Telephone Encounter (Signed)
Patients Total Bip-Vp 11/20/21 97.7%, Effective 79.2%,   On the remote reviewed by Dr. Caryl Comes on 09/18/21 Total Bi-Vp: 97.3%, Effective: 68.8%   Improvement in Effective  Bi-Vp.  This will need to be reviewed with Dr. Caryl Comes if he wants any optimization of device patient will need an appointment with either him or EPP APP with SK in office

## 2021-11-21 NOTE — Telephone Encounter (Signed)
Patient states yesterday when she saw Dr. Aundra Dubin she was advised to follow up with Dr. Caryl Comes because her ICD is functioning at 79%, but it is normally at about 98-99%. Patient informed me that she has also had additional stress in her personal life recently which may be a factor.

## 2021-11-24 DIAGNOSIS — I10 Essential (primary) hypertension: Secondary | ICD-10-CM | POA: Diagnosis not present

## 2021-11-24 DIAGNOSIS — K219 Gastro-esophageal reflux disease without esophagitis: Secondary | ICD-10-CM | POA: Diagnosis not present

## 2021-11-24 DIAGNOSIS — E039 Hypothyroidism, unspecified: Secondary | ICD-10-CM | POA: Diagnosis not present

## 2021-11-24 DIAGNOSIS — R69 Illness, unspecified: Secondary | ICD-10-CM | POA: Diagnosis not present

## 2021-11-24 DIAGNOSIS — E782 Mixed hyperlipidemia: Secondary | ICD-10-CM | POA: Diagnosis not present

## 2021-11-25 DIAGNOSIS — C44722 Squamous cell carcinoma of skin of right lower limb, including hip: Secondary | ICD-10-CM | POA: Diagnosis not present

## 2021-11-25 NOTE — Telephone Encounter (Signed)
Pt is calling back for follow up

## 2021-11-28 NOTE — Telephone Encounter (Signed)
We can bring her in to device clinic--on a day that I am in the office

## 2021-12-09 NOTE — Progress Notes (Addendum)
Patient ID: Heather Smith, female   DOB: 12-26-1932, 88 y.o.   MRN: [id number] PCP: Dr. Kenton Kingfisher HF Cardiology: Aundra Dubin EP: Dr. Caryl Comes   88 y.o. with history of nonischemic cardiomyopathy and chronic systolic HF.  In 9/14, she was admitted to [facility] with chest pain.  Cardiac cath was done, showing no significant coronary disease.  However, echo showed EF 30-35%.  She was started on meds for CHF, and echo was repeated in 12/14, EF was read as 20-25%.  She had a Medtonic CRT-D device placed (had baseline LBBB).  In 3/15, the LV lead was not capturing properly so device had to be reprogrammed.  She had AV optimization in 5/15.  Limited echo at that time showed improvement in EF to 40-45%.  CPX in 5/15 was submaximal but suggested low normal to mildly reduced functional capacity. Echo in 6/16 showed EF 35-40% with diffuse hypokinesis.  CPX was done in 9/16.  This actually showed good functional capacity. She had repeat echo in 1/17, showing EF increased to 60-65%.  Most recent echo in 8/18 showed EF 50-55% with mild LV dilation and mild MR.    She was seen by Dr. Caryl Comes for frequent PVCs (symptomatic).  She was started on flecainide.  She thinks that this has helped .   Atypical chest pain in 11/19, Cardiolite was normal.   Repeat echo in 1/20 showed EF 50-55%, moderate MR.    PYP scan in 1/21 was not suggestive of TTR amyloidosis.  Echo in 5/21 showed EF 50-55%, normal RV, mild MR.   She was admitted 12/22 with near syncope and AKI due to hypotension in setting of COVID-19 infection. SCr 1.5 on admit (baseline ~1.1). Also had brief episodes of atrial flutter, captured on device interrogation (longest duration 8 min). Head CT was negative. She was treated by general cardiology. Per documentation, there was no evidence of  acute HF decompensation/hypervolemia. Coreg, Entresto and diuretics were initially held. Gentle IVF hydration administered. AKI resolved and BP improved. She had no further AFL/SVT.   Echo showed EF 55-60%. RV ok. No significant valvular dysfunction. HF meds were reintroduced prior to d/c, lasix PRN. She completed antiviral Molpunavir for COVID. Scr 1.07 at discharge.   Today she returns for routine 6 month f/u. Here w/ daughter. Reports has been more depressed lately, after husband died in the spring. Also reports nocturnal chest discomfort, seems likely acid reflux. Relieved w/ belching and antacids. No exertional CP but reports exertional dyspnea, NYHA Class II-early III. Device interrogation shows good impedence, index < threshold. No AT/AF. No VT/VF. However, effective CRT pacing is < 90% (79.2%). Today's EKG shows atrial sensed v-paced rhythm, 75 bpm. No PVCs. BP stable, 118/68. Reports full compliance w/ meds. Some occasional positional dizziness if she stands too quickly. Seen by PCP earlier this week and CP symptoms also felt likely due to GI etiology. Given Rx for Protonix w/ plans to refer to GI if symptoms persists.    ECG (personally reviewed): Atrial-sensed v-paced 75  Medtronic device interrogation: Fluid index < threshold, <90% BiV pacing (only 79.2 % effective BiV pacing), no AF, no VT (personally reviewed).  Labs (9/14): TSH normal Labs (3/15): K 4.7, creatinine 1.1 Labs (5/15): K 4.9, creatinine 1.1, BNP 44 Labs (6/15): K 5.1, creatinine 1.4, pro-BNP 39 Labs (6/15): K 5.3, creatinine 1.53 Labs (12/15): K 4.4, creatinine 1.03 Labs (5/16): K 4, creatinine 1.0, BNP 34.6 Labs (6/16): K 4.3, creatinine 1.1 Labs (9/16): K 4.4, creatinine 1.16 Labs (11/16):  K 4.5, creatinine 1.16 Labs (12/16): K 4.6, creatinine 1.16, BNP 22, Mg 1.9 Labs (1/17): K 4.3, creatinine 1.34, HCT 42.4, ESR normal Labs (6/17): K 4.6, creatinine 1.23, hgb 9.9, TSH normal Labs (6/18): K 4.7, creatinine 1.09, BNP 94, hgb 13.8, TSH normal Labs (12/18): K 4.7, creatinine 0.98 Labs (10/19): BNP 25, K 4.8, creatinine 1.11 Labs (7/20): K 5, creatinine 1.17 Labs (9/20): K 4.9, creatinine  1.37 Labs (4/21): TSH normal, hgb 13.5, K 4.3, creatinine 1.3 Labs (1/22): K 4.4, creatinine 1.18 Lab (12/22): K 3.6, creatinine 1.07  Labs (4/23): K 4.3, creatinine 1.13, LDL 81   PMH: 1. Chronic systolic CHF: Nonischemic cardiomyopathy.  LHC (9/14) with no significant coronary disease, EF 25-30% with global hypokinesis.  Echo (9/14) with EF 30-35%, moderate LV dilation, diffuse hypokinesis.  Echo (12/14) with EF 20-25%.  She does not drink ETOH.  She had Medtronic CRT-D device implantation in 1/15. Echo (5/15) with EF 40-45%, mild LVH.  CPX (5/15) was submaximal with RER 0.99, peak VO2 14, VE/VCO2 37.3; low normal functional capacity may be limited by loss of BiV pacing at higher HR and by deconditioning but interpretation somewhat limited since study was submaximal.  Echo (6/16) with EF 35-40%, severe LV dilation, diffuse hypokinesis, mild MR.  CPX (9/16) with peak VO2 17.7, VE/VCO2 slope 32.3, RER 1.11 => excellent functional capacity. Echo (11/17) with EF 60-65%.  - Echo (8/18): EF 50-55%, mildly dilated LV, mild MR.  - Echo (1/20): EF 50-55%, moderate MR.  - PYP scan (1/21): H/CL 1.0, grade 1.  Not suggestive of TTR amyloidosis.  - Echo (5/21): EF 50-55%, normal RV, mild MR. 2. Back pain s/p lumbar laminectomy 3. OA: Knees. Left TKR in 6/17.  4. Hyperlipidemia 5. Depression 6. PVCs 7. Peripheral neuropathy: Uncertain etiology.  She is not a diabetic.  8. Lung nodule: Last CT chest in 1/19 showed stable 6 mm LLL nodule, no further followup recommended.  9. Chest pain: Cardiolite (11/19) with EF 74%, no evidence for ischemia/infarction.   SH: Lives [place], married, never smoked, no ETOH.    FH: Mother with MI at 15, possible ischemic cardiomyopathy.  Father with cardiomyopathy, died at 55.  She has 5 brothers, none with significant coronary disease.    ROS: All systems reviewed and negative except as per HPI.   Current Outpatient Medications  Medication Sig Dispense Refill    acetaminophen (TYLENOL) 325 MG tablet Take 325 mg by mouth every 6 (six) hours as needed for moderate pain or headache.     Ascorbic Acid (VITAMIN C) 1000 MG tablet Take 1,000 mg by mouth daily.     carvedilol (COREG) 3.125 MG tablet TAKE ONE TABLET BY MOUTH AT BREAKFAST AND AT BEDTIME 180 tablet 3   Cholecalciferol (VITAMIN D-3) 125 MCG (5000 UT) TABS Take by mouth.     DULoxetine (CYMBALTA) 30 MG capsule Take 30 mg by mouth daily.      ezetimibe (ZETIA) 10 MG tablet Take 1 tablet by mouth once daily 90 tablet 3   flecainide (TAMBOCOR) 50 MG tablet TAKE ONE TABLET BY MOUTH EVERY MORNING and TAKE ONE TABLET BY MOUTH EVERYDAY AT BEDTIME 180 tablet 3   levothyroxine (SYNTHROID, LEVOTHROID) 50 MCG tablet Take 50 mcg by mouth daily.     Magnesium 250 MG TABS Take 250 mg by mouth daily.     multivitamin (THERAGRAN) per tablet Take 1 tablet by mouth daily.     pantoprazole (PROTONIX) 40 MG tablet Take 40 mg by  mouth 2 (two) times daily.     sacubitril-valsartan (ENTRESTO) 24-26 MG Take 1 tablet by mouth 2 (two) times daily. 180 tablet 3   spironolactone (ALDACTONE) 25 MG tablet Take 1 tablet (25 mg total) by mouth daily. 90 tablet 3   vitamin B-12 (CYANOCOBALAMIN) 1000 MCG tablet Take 1,000 mcg by mouth daily.     zinc gluconate 50 MG tablet Take 50 mg by mouth daily.     Current Facility-Administered Medications  Medication Dose Route Frequency Provider Last Rate Last Admin   triamcinolone acetonide (KENALOG) 10 MG/ML injection 10 mg  10 mg Other Once Regal, Norman S, DPM       triamcinolone acetonide (KENALOG) 10 MG/ML injection 10 mg  10 mg Other Once Regal, Norman S, DPM       triamcinolone acetonide (KENALOG) 10 MG/ML injection 10 mg  10 mg Other Once Wallene Huh, DPM       There were no vitals taken for this visit.  Wt Readings from Last 3 Encounters:  11/20/21 87.4 kg (192 lb 9.6 oz)  08/06/21 85.7 kg (189 lb)  05/06/21 84.9 kg (187 lb 3.2 oz)   There were no vitals filed for this  visit.  There is no height or weight on file to calculate BMI.  PHYSICAL EXAM: General:  Well appearing. No respiratory difficulty HEENT: normal Neck: supple. no JVD. Carotids 2+ bilat; no bruits. No lymphadenopathy or thyromegaly appreciated. Cor: PMI nondisplaced. Regular rate & rhythm. No rubs, gallops or murmurs. Lungs: clear Abdomen: soft, nontender, nondistended. No hepatosplenomegaly. No bruits or masses. Good bowel sounds. Extremities: no cyanosis, clubbing, rash, edema Neuro: alert & oriented x 3, cranial nerves grossly intact. moves all 4 extremities w/o difficulty. Affect pleasant.   Assessment/Plan: 1. Chronic systolic CHF: Nonischemic cardiomyopathy s/p Medtronic CRT-D. EF 35-40% (6/16). She does have a family history of cardiomyopathy of uncertain etiology (father) but none of her siblings developed a cardiomyopathy.  Familial cardiomyopathy is a consideration.  Viral myocarditis is also a possible etiology.  Echo in 1/17 showed improvement in EF to 60-65%, echo in 8/18 showed EF 50-55% and echo in 1/20 showed stable EF 50-55%.  PYP scan not suggestive of TTR amyloidosis. Echo in 5/21 showed EF 50-55%, normal RV, mild MR. Echo 12/22 EF 55-60%, RV ok. Reports slight increase in exertional dyspnea, now NYHA II-early III. She continues with persistent fatigue. She is not volume overloaded on exam or by device. Optivol fluid index/ impedence is good. However only BiV paced 79% on the time. Suspect ineffective pacing contributing to symptoms - Update echo to reassess LVEF given reduction in effective BiV pacing. Refer to EP clinic for adjustment of device settings - Continue Entresto 24-26 mg bid. Avoiding dose increase given age and occasional positional dizziness  - Continue spiro 25 mg daily  - Continue Coreg 3.125 mg bid  - Lasix 20 mg PRN.  - Check BMP/BNP today  - Has CRT-D device. Refer back to EP for device adjustment given drop in BiV pacing  2. PVCs: Followed by EP, now on  flecainide. Minimal palpitations now. No PVCs on EKG today   - Continue flecainide 50 mg bid.  Will need nodal blocking agent while on flecainide, she is on Coreg.  3. Peripheral neuropathy: Bothersome peripheral neuropathy. PYP scan not suggestive of TTR amyloidosis.                     4. Fatigue: Chronic, has been present for years.  5. HTN: Controlled on current regimen.  - check BMP today  6. PAD: Abnormal ABIs on insurance screening.  She has peripheral neuropathy symptoms but no definite claudication. LE arterial dopplers 10/22 w/ normal ABIs  7. HLD: LP 14/23 w/ elevated LDL, 81 mg/dL. Goal LDL < 70. On Zetia 10 mg daily (cannot take statins).  - Repeat LP. If LDL not < 70 on followup lipid profile, refer for Repatha.  8. Atrial Flutter: No further AT/AF detected on today's interrogation. No indication for a/c. Device followed in EP device clinic   F/u w/ APP after echo in 3-4 wks. Needs f/u in EP clinic for device check/ optimization of CRT-D. Will route note to Dr. Caryl Comes.                                           Maricela Bo Lupus, PA-C  12/09/2021

## 2021-12-09 NOTE — Telephone Encounter (Signed)
Pt is already scheduled with Oda Kilts PA-C on 12/22/2021 and is aware of appointment date and time.

## 2021-12-10 ENCOUNTER — Ambulatory Visit (HOSPITAL_BASED_OUTPATIENT_CLINIC_OR_DEPARTMENT_OTHER)
Admission: RE | Admit: 2021-12-10 | Discharge: 2021-12-10 | Disposition: A | Discharge status: Home / Self Care | Payer: Medicare HMO | Source: Ambulatory Visit | Attending: Physician Assistant | Admitting: Physician Assistant

## 2021-12-10 ENCOUNTER — Ambulatory Visit (HOSPITAL_COMMUNITY)
Admission: RE | Admit: 2021-12-10 | Discharge: 2021-12-10 | Disposition: A | Discharge status: Home / Self Care | Payer: Medicare HMO | Source: Ambulatory Visit | Attending: Family Medicine | Admitting: Family Medicine

## 2021-12-10 ENCOUNTER — Encounter (HOSPITAL_COMMUNITY): Payer: Self-pay

## 2021-12-10 VITALS — BP 130/80 | HR 79 | Wt 174.2 lb

## 2021-12-10 DIAGNOSIS — Z79899 Other long term (current) drug therapy: Secondary | ICD-10-CM | POA: Diagnosis not present

## 2021-12-10 DIAGNOSIS — Z8249 Family history of ischemic heart disease and other diseases of the circulatory system: Secondary | ICD-10-CM | POA: Diagnosis not present

## 2021-12-10 DIAGNOSIS — I493 Ventricular premature depolarization: Secondary | ICD-10-CM | POA: Diagnosis not present

## 2021-12-10 DIAGNOSIS — I5032 Chronic diastolic (congestive) heart failure: Secondary | ICD-10-CM | POA: Diagnosis not present

## 2021-12-10 DIAGNOSIS — Z95810 Presence of automatic (implantable) cardiac defibrillator: Secondary | ICD-10-CM | POA: Diagnosis not present

## 2021-12-10 DIAGNOSIS — I110 Hypertensive heart disease with heart failure: Secondary | ICD-10-CM | POA: Insufficient documentation

## 2021-12-10 DIAGNOSIS — I4892 Unspecified atrial flutter: Secondary | ICD-10-CM | POA: Insufficient documentation

## 2021-12-10 DIAGNOSIS — I480 Paroxysmal atrial fibrillation: Secondary | ICD-10-CM | POA: Diagnosis not present

## 2021-12-10 DIAGNOSIS — E785 Hyperlipidemia, unspecified: Secondary | ICD-10-CM

## 2021-12-10 DIAGNOSIS — R5383 Other fatigue: Secondary | ICD-10-CM | POA: Diagnosis not present

## 2021-12-10 DIAGNOSIS — I081 Rheumatic disorders of both mitral and tricuspid valves: Secondary | ICD-10-CM | POA: Diagnosis not present

## 2021-12-10 DIAGNOSIS — I5022 Chronic systolic (congestive) heart failure: Secondary | ICD-10-CM | POA: Insufficient documentation

## 2021-12-10 DIAGNOSIS — I739 Peripheral vascular disease, unspecified: Secondary | ICD-10-CM | POA: Insufficient documentation

## 2021-12-10 DIAGNOSIS — G629 Polyneuropathy, unspecified: Secondary | ICD-10-CM | POA: Insufficient documentation

## 2021-12-10 DIAGNOSIS — I10 Essential (primary) hypertension: Secondary | ICD-10-CM | POA: Diagnosis not present

## 2021-12-10 DIAGNOSIS — R5382 Chronic fatigue, unspecified: Secondary | ICD-10-CM | POA: Insufficient documentation

## 2021-12-10 DIAGNOSIS — I428 Other cardiomyopathies: Secondary | ICD-10-CM | POA: Insufficient documentation

## 2021-12-10 DIAGNOSIS — M170 Bilateral primary osteoarthritis of knee: Secondary | ICD-10-CM | POA: Insufficient documentation

## 2021-12-10 LAB — ECHOCARDIOGRAM COMPLETE
Area-P 1/2: 2.5 cm2
Calc EF: 60.9 %
MV M vel: 3.18 m/s
MV Peak grad: 40.3 mmHg
S' Lateral: 3.6 cm
Single Plane A2C EF: 51.8 %
Single Plane A4C EF: 69.5 %

## 2021-12-10 MED ORDER — LOSARTAN POTASSIUM 25 MG PO TABS: 25.0000 mg | ORAL_TABLET | Freq: Every day | ORAL | 3 refills | Status: DC

## 2021-12-10 NOTE — Patient Instructions (Signed)
STOP Entresto START Losartan 25 mg one tab nightly at bedtime   Your physician wants you to follow-up in: 6 months with Dr Kendall Flack will receive a reminder letter in the mail two months in advance. If you don't receive a letter, please call our office to schedule the follow-up appointment.   Do the following things EVERYDAY: Weigh yourself in the morning before breakfast. Write it down and keep it in a log. Take your medicines as prescribed Eat low salt foods--Limit salt (sodium) to 2000 mg per day.  Stay as active as you can everyday Limit all fluids for the day to less than 2 liters   At the [facility], you and your health needs are our priority. As part of our continuing mission to provide you with exceptional heart care, we have created designated Provider Care Teams. These Care Teams include your primary Cardiologist (physician) and Advanced Practice Providers (APPs- Physician Assistants and Nurse Practitioners) who all work together to provide you with the care you need, when you need it.   You may see any of the following providers on your designated Care Team at your next follow up: Dr Glori Bickers Dr Loralie Champagne Dr. Roxana Hires, NP Lyda Jester, [facility] [place], [place] Forestine Na, NP Audry Riles, PharmD   Please be sure to bring in all your medications bottles to every appointment.    If you have any questions or concerns before your next appointment please send [place] a message through [place] or call our office at [phone].    TO LEAVE A MESSAGE FOR THE NURSE SELECT OPTION 2, PLEASE LEAVE A MESSAGE INCLUDING: YOUR NAME DATE OF BIRTH CALL BACK NUMBER REASON FOR CALL**this is important as we prioritize the call backs  YOU WILL RECEIVE A CALL BACK THE SAME DAY AS LONG AS YOU CALL BEFORE 4:00 PM

## 2021-12-10 NOTE — Progress Notes (Signed)
  Echocardiogram 2D Echocardiogram has been performed.  Heather Smith 12/10/2021, 11:40 AM

## 2021-12-18 ENCOUNTER — Ambulatory Visit (INDEPENDENT_AMBULATORY_CARE_PROVIDER_SITE_OTHER): Payer: Medicare HMO

## 2021-12-18 DIAGNOSIS — K219 Gastro-esophageal reflux disease without esophagitis: Secondary | ICD-10-CM | POA: Diagnosis not present

## 2021-12-18 DIAGNOSIS — E039 Hypothyroidism, unspecified: Secondary | ICD-10-CM | POA: Diagnosis not present

## 2021-12-18 DIAGNOSIS — E782 Mixed hyperlipidemia: Secondary | ICD-10-CM | POA: Diagnosis not present

## 2021-12-18 DIAGNOSIS — R69 Illness, unspecified: Secondary | ICD-10-CM | POA: Diagnosis not present

## 2021-12-18 DIAGNOSIS — I428 Other cardiomyopathies: Secondary | ICD-10-CM

## 2021-12-18 DIAGNOSIS — I10 Essential (primary) hypertension: Secondary | ICD-10-CM | POA: Diagnosis not present

## 2021-12-18 LAB — CUP PACEART REMOTE DEVICE CHECK
Battery Remaining Longevity: 24 mo
Battery Voltage: 2.93 V
Brady Statistic AP VP Percent: 33.61 %
Brady Statistic AP VS Percent: 0.27 %
Brady Statistic AS VP Percent: 65.17 %
Brady Statistic AS VS Percent: 0.94 %
Brady Statistic RA Percent Paced: 33.83 %
Brady Statistic RV Percent Paced: 98.45 %
Date Time Interrogation Session: 20231116072205
HighPow Impedance: 71 Ohm
Implantable Lead Connection Status: 753985
Implantable Lead Connection Status: 753985
Implantable Lead Connection Status: 753985
Implantable Lead Implant Date: 20150121
Implantable Lead Implant Date: 20150121
Implantable Lead Implant Date: 20150121
Implantable Lead Location: 753858
Implantable Lead Location: 753859
Implantable Lead Location: 753860
Implantable Lead Model: 4298
Implantable Lead Model: 5076
Implantable Pulse Generator Implant Date: 20200717
Lead Channel Impedance Value: 188.1 Ohm
Lead Channel Impedance Value: 188.1 Ohm
Lead Channel Impedance Value: 195.429
Lead Channel Impedance Value: 209 Ohm
Lead Channel Impedance Value: 218.087
Lead Channel Impedance Value: 342 Ohm
Lead Channel Impedance Value: 418 Ohm
Lead Channel Impedance Value: 418 Ohm
Lead Channel Impedance Value: 418 Ohm
Lead Channel Impedance Value: 456 Ohm
Lead Channel Impedance Value: 456 Ohm
Lead Channel Impedance Value: 551 Ohm
Lead Channel Impedance Value: 589 Ohm
Lead Channel Impedance Value: 646 Ohm
Lead Channel Impedance Value: 646 Ohm
Lead Channel Impedance Value: 665 Ohm
Lead Channel Impedance Value: 722 Ohm
Lead Channel Impedance Value: 722 Ohm
Lead Channel Pacing Threshold Amplitude: 0.625 V
Lead Channel Pacing Threshold Amplitude: 0.75 V
Lead Channel Pacing Threshold Amplitude: 1.75 V
Lead Channel Pacing Threshold Pulse Width: 0.4 ms
Lead Channel Pacing Threshold Pulse Width: 0.4 ms
Lead Channel Pacing Threshold Pulse Width: 1 ms
Lead Channel Sensing Intrinsic Amplitude: 16.625 mV
Lead Channel Sensing Intrinsic Amplitude: 16.625 mV
Lead Channel Sensing Intrinsic Amplitude: 3.125 mV
Lead Channel Sensing Intrinsic Amplitude: 3.125 mV
Lead Channel Setting Pacing Amplitude: 1.5 V
Lead Channel Setting Pacing Amplitude: 2 V
Lead Channel Setting Pacing Amplitude: 2 V
Lead Channel Setting Pacing Pulse Width: 0.4 ms
Lead Channel Setting Pacing Pulse Width: 1 ms
Lead Channel Setting Sensing Sensitivity: 0.3 mV
Zone Setting Status: 755011
Zone Setting Status: 755011

## 2021-12-19 NOTE — Progress Notes (Unsigned)
Electrophysiology Office Note Date: 12/22/2021  ID:  Heather Smith, DOB 1932-07-06, MRN [id number]  PCP: Shirline Frees, MD Primary Cardiologist: None Electrophysiologist: Virl Axe, MD   CC: Routine ICD follow-up  Heather Smith is a 88 y.o. female seen today for Virl Axe, MD for acute visit due to ineffective BiV pacing.    Patient reports feeling somewhat fatigued over the past couple of months.  she denies chest pain, palpitations, PND, orthopnea, nausea, vomiting, dizziness, syncope, edema, weight gain, or early satiety.    Device History: MDT CRTD implanted 2015 for CHF, gen change 2020 History of appropriate therapy: No History of AAD therapy: No  Past Medical History:  Diagnosis Date   Cardiac resynchronization therapy defibrillator (CRT-D) in place    Chronic kidney disease, stage 3a ([place])    Chronic systolic CHF (congestive heart failure) (HCC)    Complication of anesthesia    difficult to wake up and blood pressure drops   GERD (gastroesophageal reflux disease)    Hypercholesterolemia    Hypertension    Hypothyroidism    Left bundle branch block    LV lead partial dislodgment 05/30/2013   Neurogenic claudication due to lumbar spinal stenosis    Neuropathy    Non-ischemic cardiomyopathy ([place])    [place] 10/04/12: Normal coronary arteries, EF 25-30% with global HK.  Echocardiogram 10/05/12: EF 30-35%, diffuse HK, moderate LAE. Echocardiogram (01/2013): Lateral to septal wall dyssynchrony EF 20-25%, diffuse HK   Osteoarthritis    "about all over" (02/22/2013)   PVC's (premature ventricular contractions)    Spinal stenosis    Vitamin D deficiency    Past Surgical History:  Procedure Laterality Date   APPENDECTOMY     BI-VENTRICULAR IMPLANTABLE CARDIOVERTER DEFIBRILLATOR N/A 02/22/2013   Procedure: BI-VENTRICULAR IMPLANTABLE CARDIOVERTER DEFIBRILLATOR  (CRT-D);  Surgeon: Deboraha Sprang, MD;  Location: [facility] CATH LAB;  Service: Cardiovascular;  Laterality:  N/A;   BIV ICD GENERATOR CHANGEOUT N/A 08/19/2018   Procedure: BIV ICD GENERATOR CHANGEOUT;  Surgeon: Deboraha Sprang, MD;  Location: [facility] CV LAB;  Service: Cardiovascular;  Laterality: N/A;   BLEPHAROPLASTY Bilateral    EXCISIONAL HEMORRHOIDECTOMY     FOOT ARTHRODESIS, MODIFIED MCBRIDE Bilateral    KNEE ARTHROSCOPY Right    LEFT HEART CATHETERIZATION WITH CORONARY ANGIOGRAM N/A 10/04/2012   Procedure: LEFT HEART CATHETERIZATION WITH CORONARY ANGIOGRAM;  Surgeon: Peter M Martinique, MD;  Location: [facility] CATH LAB;  Service: Cardiovascular;  Laterality: N/A;   LUMBAR LAMINECTOMY  11/03/2006    Bilateral 3, 4, 5 laminectomy, partial L2, decompression of the thecal sac, foraminotomy, posterolateral arthrodesis L3-S1 with autograft   -- SURGEON:  Leeroy Cha, M.D.    TONSILLECTOMY     TOTAL KNEE ARTHROPLASTY Left 07/15/2015   Procedure: LEFT TOTAL KNEE ARTHROPLASTY;  Surgeon: Frederik Pear, MD;  Location: [facility];  Service: Orthopedics;  Laterality: Left;   TUBAL LIGATION     VAGINAL HYSTERECTOMY      Current Outpatient Medications  Medication Sig Dispense Refill   acetaminophen (TYLENOL) 325 MG tablet Take 325 mg by mouth every 6 (six) hours as needed for moderate pain or headache.     Ascorbic Acid (VITAMIN C) 1000 MG tablet Take 1,000 mg by mouth daily.     carvedilol (COREG) 3.125 MG tablet TAKE ONE TABLET BY MOUTH AT BREAKFAST AND AT BEDTIME 180 tablet 3   Cholecalciferol (VITAMIN D-3) 125 MCG (5000 UT) TABS Take by mouth.     DULoxetine (CYMBALTA) 30 MG capsule Take  30 mg by mouth daily.      ezetimibe (ZETIA) 10 MG tablet Take 1 tablet by mouth once daily 90 tablet 3   flecainide (TAMBOCOR) 50 MG tablet TAKE ONE TABLET BY MOUTH EVERY MORNING and TAKE ONE TABLET BY MOUTH EVERYDAY AT BEDTIME 180 tablet 3   furosemide (LASIX) 20 MG tablet Take 20 mg by mouth 2 (two) times a week.     levothyroxine (SYNTHROID, LEVOTHROID) 50 MCG tablet Take 50 mcg by mouth daily.     losartan (COZAAR) 25 MG  tablet Take 1 tablet (25 mg total) by mouth daily. 90 tablet 3   Magnesium 250 MG TABS Take 250 mg by mouth daily.     multivitamin (THERAGRAN) per tablet Take 1 tablet by mouth daily.     pantoprazole (PROTONIX) 40 MG tablet Take 40 mg by mouth 2 (two) times daily.     spironolactone (ALDACTONE) 25 MG tablet Take 1 tablet (25 mg total) by mouth daily. 90 tablet 3   traMADol (ULTRAM) 50 MG tablet Take 50 mg by mouth daily as needed.     traZODone (DESYREL) 50 MG tablet Take 50 mg by mouth at bedtime as needed for sleep.     vitamin B-12 (CYANOCOBALAMIN) 1000 MCG tablet Take 1,000 mcg by mouth daily.     zinc gluconate 50 MG tablet Take 50 mg by mouth daily.     Current Facility-Administered Medications  Medication Dose Route Frequency Provider Last Rate Last Admin   triamcinolone acetonide (KENALOG) 10 MG/ML injection 10 mg  10 mg Other Once Regal, Norman S, DPM       triamcinolone acetonide (KENALOG) 10 MG/ML injection 10 mg  10 mg Other Once Regal, Norman S, DPM       triamcinolone acetonide (KENALOG) 10 MG/ML injection 10 mg  10 mg Other Once Regal, Norman S, DPM        Allergies:   Statins, Niaspan [niacin er], Simvastatin, Benadryl [diphenhydramine], and Dilaudid [hydromorphone hcl]   Social History: Social History   Socioeconomic History   Marital status: Married    Spouse name: Not on file   Number of children: 4   Years of education: 13   Highest education level: Not on file  Occupational History   Occupation: retired  Tobacco Use   Smoking status: Never   Smokeless tobacco: Never  Vaping Use   Vaping Use: Never used  Substance and Sexual Activity   Alcohol use: No   Drug use: No   Sexual activity: Not Currently  Other Topics Concern   Not on file  Social History Narrative   Lives in [place], [place] with husband.  Active around the house.   Right-handed   Caffeine: 3-4 glasses tea per day   Social Determinants of Health   Financial Resource Strain: Low Risk   (11/22/2017)   Overall Financial Resource Strain (CARDIA)    Difficulty of Paying Living Expenses: Not hard at all  Food Insecurity: No Food Insecurity (11/22/2017)   Hunger Vital Sign    Worried About Running Out of Food in the Last Year: Never true    Ran Out of Food in the Last Year: Never true  Transportation Needs: No Transportation Needs (11/22/2017)   PRAPARE - Hydrologist (Medical): No    Lack of Transportation (Non-Medical): No  Physical Activity: Not on file  Stress: Not on file  Social Connections: Not on file  Intimate Partner Violence: Not on file  Family History: Family History  Problem Relation Age of Onset   Aneurysm Father        died @ 57.   Heart attack Mother        died @ 43.   Thyroid disease Mother    Lung cancer Mother    Hyperlipidemia Mother    Aneurysm Brother        has had an Aneurysm x3   Colon cancer Brother    Bone cancer Brother    Lung cancer Brother    Stomach cancer Brother    Neuropathy Neg Hx     Review of Systems: All other systems reviewed and are otherwise negative except as noted above.   Physical Exam: There were no vitals filed for this visit.   GEN- The patient is well appearing, alert and oriented x 3 today.   HEENT: normocephalic, atraumatic; sclera clear, conjunctiva pink; hearing intact; oropharynx clear; neck supple, no JVP Lymph- no cervical lymphadenopathy Lungs- Clear to ausculation bilaterally, normal work of breathing.  No wheezes, rales, rhonchi Heart- Regular  rate and rhythm, no murmurs, rubs or gallops, PMI not laterally displaced GI- soft, non-tender, non-distended, bowel sounds present, no hepatosplenomegaly Extremities- no clubbing or cyanosis. No peripheral edema; DP/PT/radial pulses 2+ bilaterally MS- no significant deformity or atrophy Skin- warm and dry, no rash or lesion; ICD pocket well healed Psych- euthymic mood, full affect Neuro- strength and sensation are  intact  ICD interrogation- reviewed in detail today,  See PACEART report  EKG:  EKG is not ordered today.  Recent Labs: 01/17/2021: TSH 0.829 01/19/2021: Magnesium 1.9 11/20/2021: ALT 17; B Natriuretic Peptide 13.7; BUN 17; Creatinine, Ser 1.17; Hemoglobin 14.4; Platelets 168; Potassium 4.7; Sodium 142   Wt Readings from Last 3 Encounters:  12/10/21 174 lb 3.2 oz (79 kg)  11/20/21 192 lb 9.6 oz (87.4 kg)  08/06/21 189 lb (85.7 kg)     Other studies Reviewed: Additional studies/ records that were reviewed today include: Previous EP office notes.   Assessment and Plan:  1.  Chronic systolic dysfunction s/p Medtronic CRT-D  euvolemic today Stable on an appropriate medical regimen Normal ICD function See Pace Art report LV threshold 1.5V @ 1.4m in person today, but historically has been as high as 2.25V @ 1.0 ms. When her amplitude was lowered to 2.0 V in July, she had a large loss of effective capture.   Would not program her below 2.75V @ 1.0 ms to keep 0.5V safety margin from her highest known threshold.  Today, her effective pacing has corrected and is > 92%.  Current medicines are reviewed at length with the patient today.    Disposition:   Follow up with Dr. KCaryl Comesin 6 months    Signed, MShirley Friar PA-C  12/22/2021 12:29 PM  [id number] [phone](office) [phone](fax)

## 2021-12-22 ENCOUNTER — Ambulatory Visit: Payer: Medicare HMO | Attending: Student | Admitting: Student

## 2021-12-22 ENCOUNTER — Encounter: Payer: Self-pay | Admitting: Student

## 2021-12-22 VITALS — BP 126/74 | HR 72 | Ht 67.5 in | Wt 194.0 lb

## 2021-12-22 DIAGNOSIS — I5022 Chronic systolic (congestive) heart failure: Secondary | ICD-10-CM | POA: Diagnosis not present

## 2021-12-22 DIAGNOSIS — I10 Essential (primary) hypertension: Secondary | ICD-10-CM | POA: Diagnosis not present

## 2021-12-22 DIAGNOSIS — I493 Ventricular premature depolarization: Secondary | ICD-10-CM | POA: Diagnosis not present

## 2021-12-22 NOTE — Patient Instructions (Signed)
Medication Instructions:  Your physician recommends that you continue on your current medications as directed. Please refer to the Current Medication list given to you today.  *If you need a refill on your cardiac medications before your next appointment, please call your pharmacy*   Lab Work: None If you have labs (blood work) drawn today and your tests are completely normal, you will receive your results only by: [place] (if you have MyChart) OR A paper copy in the mail If you have any lab test that is abnormal or we need to change your treatment, we will call you to review the results.   Follow-Up: At [facility], you and your health needs are our priority.  As part of our continuing mission to provide you with exceptional heart care, we have created designated Provider Care Teams.  These Care Teams include your primary Cardiologist (physician) and Advanced Practice Providers (APPs -  Physician Assistants and Nurse Practitioners) who all work together to provide you with the care you need, when you need it.  Your next appointment:   6 month(s)  The format for your next appointment:   In Person  Provider:   Virl Axe, MD    Important Information About Sugar

## 2021-12-23 LAB — CUP PACEART INCLINIC DEVICE CHECK
Battery Remaining Longevity: 24 mo
Battery Voltage: 2.93 V
Brady Statistic AP VP Percent: 35.7 %
Brady Statistic AP VS Percent: 0.27 %
Brady Statistic AS VP Percent: 63.13 %
Brady Statistic AS VS Percent: 0.9 %
Brady Statistic RA Percent Paced: 35.9 %
Brady Statistic RV Percent Paced: 98.49 %
Date Time Interrogation Session: 20231120184300
HighPow Impedance: 69 Ohm
Implantable Lead Connection Status: 753985
Implantable Lead Connection Status: 753985
Implantable Lead Connection Status: 753985
Implantable Lead Implant Date: 20150121
Implantable Lead Implant Date: 20150121
Implantable Lead Implant Date: 20150121
Implantable Lead Location: 753858
Implantable Lead Location: 753859
Implantable Lead Location: 753860
Implantable Lead Model: 4298
Implantable Lead Model: 5076
Implantable Pulse Generator Implant Date: 20200717
Lead Channel Impedance Value: 172.541
Lead Channel Impedance Value: 172.541
Lead Channel Impedance Value: 182.4 Ohm
Lead Channel Impedance Value: 199.5 Ohm
Lead Channel Impedance Value: 212.8 Ohm
Lead Channel Impedance Value: 304 Ohm
Lead Channel Impedance Value: 399 Ohm
Lead Channel Impedance Value: 399 Ohm
Lead Channel Impedance Value: 418 Ohm
Lead Channel Impedance Value: 456 Ohm
Lead Channel Impedance Value: 475 Ohm
Lead Channel Impedance Value: 551 Ohm
Lead Channel Impedance Value: 551 Ohm
Lead Channel Impedance Value: 589 Ohm
Lead Channel Impedance Value: 608 Ohm
Lead Channel Impedance Value: 646 Ohm
Lead Channel Impedance Value: 665 Ohm
Lead Channel Impedance Value: 703 Ohm
Lead Channel Pacing Threshold Amplitude: 0.5 V
Lead Channel Pacing Threshold Amplitude: 0.75 V
Lead Channel Pacing Threshold Amplitude: 2.25 V
Lead Channel Pacing Threshold Pulse Width: 0.4 ms
Lead Channel Pacing Threshold Pulse Width: 0.4 ms
Lead Channel Pacing Threshold Pulse Width: 1 ms
Lead Channel Sensing Intrinsic Amplitude: 15.625 mV
Lead Channel Sensing Intrinsic Amplitude: 17.875 mV
Lead Channel Sensing Intrinsic Amplitude: 2.625 mV
Lead Channel Sensing Intrinsic Amplitude: 3.25 mV
Lead Channel Setting Pacing Amplitude: 1.5 V
Lead Channel Setting Pacing Amplitude: 2 V
Lead Channel Setting Pacing Amplitude: 2.75 V
Lead Channel Setting Pacing Pulse Width: 0.4 ms
Lead Channel Setting Pacing Pulse Width: 1 ms
Lead Channel Setting Sensing Sensitivity: 0.3 mV
Zone Setting Status: 755011
Zone Setting Status: 755011

## 2021-12-31 DIAGNOSIS — M1711 Unilateral primary osteoarthritis, right knee: Secondary | ICD-10-CM | POA: Diagnosis not present

## 2022-01-07 DIAGNOSIS — M1712 Unilateral primary osteoarthritis, left knee: Secondary | ICD-10-CM | POA: Diagnosis not present

## 2022-01-07 NOTE — Progress Notes (Signed)
Remote ICD transmission.   

## 2022-01-09 ENCOUNTER — Other Ambulatory Visit (HOSPITAL_COMMUNITY): Payer: Self-pay | Admitting: Cardiology

## 2022-01-12 DIAGNOSIS — I5022 Chronic systolic (congestive) heart failure: Secondary | ICD-10-CM | POA: Diagnosis not present

## 2022-01-12 DIAGNOSIS — I10 Essential (primary) hypertension: Secondary | ICD-10-CM | POA: Diagnosis not present

## 2022-01-12 DIAGNOSIS — E782 Mixed hyperlipidemia: Secondary | ICD-10-CM | POA: Diagnosis not present

## 2022-01-12 DIAGNOSIS — E039 Hypothyroidism, unspecified: Secondary | ICD-10-CM | POA: Diagnosis not present

## 2022-01-12 DIAGNOSIS — R69 Illness, unspecified: Secondary | ICD-10-CM | POA: Diagnosis not present

## 2022-01-14 DIAGNOSIS — M1711 Unilateral primary osteoarthritis, right knee: Secondary | ICD-10-CM | POA: Diagnosis not present

## 2022-01-15 ENCOUNTER — Other Ambulatory Visit: Payer: Self-pay | Admitting: Internal Medicine

## 2022-01-15 DIAGNOSIS — I5032 Chronic diastolic (congestive) heart failure: Secondary | ICD-10-CM

## 2022-01-21 DIAGNOSIS — M1711 Unilateral primary osteoarthritis, right knee: Secondary | ICD-10-CM | POA: Diagnosis not present

## 2022-02-18 DIAGNOSIS — Z0100 Encounter for examination of eyes and vision without abnormal findings: Secondary | ICD-10-CM | POA: Diagnosis not present

## 2022-02-22 DIAGNOSIS — E785 Hyperlipidemia, unspecified: Secondary | ICD-10-CM | POA: Diagnosis not present

## 2022-02-22 DIAGNOSIS — R69 Illness, unspecified: Secondary | ICD-10-CM | POA: Diagnosis not present

## 2022-02-22 DIAGNOSIS — R001 Bradycardia, unspecified: Secondary | ICD-10-CM | POA: Diagnosis not present

## 2022-02-22 DIAGNOSIS — K219 Gastro-esophageal reflux disease without esophagitis: Secondary | ICD-10-CM | POA: Diagnosis not present

## 2022-02-22 DIAGNOSIS — I499 Cardiac arrhythmia, unspecified: Secondary | ICD-10-CM | POA: Diagnosis not present

## 2022-02-22 DIAGNOSIS — Z809 Family history of malignant neoplasm, unspecified: Secondary | ICD-10-CM | POA: Diagnosis not present

## 2022-02-22 DIAGNOSIS — Z95810 Presence of automatic (implantable) cardiac defibrillator: Secondary | ICD-10-CM | POA: Diagnosis not present

## 2022-02-22 DIAGNOSIS — G8929 Other chronic pain: Secondary | ICD-10-CM | POA: Diagnosis not present

## 2022-02-22 DIAGNOSIS — Z008 Encounter for other general examination: Secondary | ICD-10-CM | POA: Diagnosis not present

## 2022-02-22 DIAGNOSIS — I10 Essential (primary) hypertension: Secondary | ICD-10-CM | POA: Diagnosis not present

## 2022-02-22 DIAGNOSIS — Z8249 Family history of ischemic heart disease and other diseases of the circulatory system: Secondary | ICD-10-CM | POA: Diagnosis not present

## 2022-03-04 DIAGNOSIS — M1711 Unilateral primary osteoarthritis, right knee: Secondary | ICD-10-CM | POA: Diagnosis not present

## 2022-03-17 DIAGNOSIS — H43811 Vitreous degeneration, right eye: Secondary | ICD-10-CM | POA: Diagnosis not present

## 2022-03-19 ENCOUNTER — Ambulatory Visit (INDEPENDENT_AMBULATORY_CARE_PROVIDER_SITE_OTHER): Payer: Medicare HMO

## 2022-03-19 DIAGNOSIS — I428 Other cardiomyopathies: Secondary | ICD-10-CM

## 2022-03-19 DIAGNOSIS — F324 Major depressive disorder, single episode, in partial remission: Secondary | ICD-10-CM | POA: Diagnosis not present

## 2022-03-19 DIAGNOSIS — R69 Illness, unspecified: Secondary | ICD-10-CM | POA: Diagnosis not present

## 2022-03-19 DIAGNOSIS — H43811 Vitreous degeneration, right eye: Secondary | ICD-10-CM | POA: Diagnosis not present

## 2022-03-19 LAB — CUP PACEART REMOTE DEVICE CHECK
Battery Remaining Longevity: 17 mo
Battery Voltage: 2.92 V
Brady Statistic AP VP Percent: 31.69 %
Brady Statistic AP VS Percent: 0.47 %
Brady Statistic AS VP Percent: 66.51 %
Brady Statistic AS VS Percent: 1.33 %
Brady Statistic RA Percent Paced: 31.98 %
Brady Statistic RV Percent Paced: 97.5 %
Date Time Interrogation Session: 20240215063424
HighPow Impedance: 76 Ohm
Implantable Lead Connection Status: 753985
Implantable Lead Connection Status: 753985
Implantable Lead Connection Status: 753985
Implantable Lead Implant Date: 20150121
Implantable Lead Implant Date: 20150121
Implantable Lead Implant Date: 20150121
Implantable Lead Location: 753858
Implantable Lead Location: 753859
Implantable Lead Location: 753860
Implantable Lead Model: 4298
Implantable Lead Model: 5076
Implantable Pulse Generator Implant Date: 20200717
Lead Channel Impedance Value: 184.154
Lead Channel Impedance Value: 184.154
Lead Channel Impedance Value: 188.1 Ohm
Lead Channel Impedance Value: 199.5 Ohm
Lead Channel Impedance Value: 204.14 Ohm
Lead Channel Impedance Value: 342 Ohm
Lead Channel Impedance Value: 399 Ohm
Lead Channel Impedance Value: 399 Ohm
Lead Channel Impedance Value: 418 Ohm
Lead Channel Impedance Value: 418 Ohm
Lead Channel Impedance Value: 475 Ohm
Lead Channel Impedance Value: 551 Ohm
Lead Channel Impedance Value: 589 Ohm
Lead Channel Impedance Value: 646 Ohm
Lead Channel Impedance Value: 665 Ohm
Lead Channel Impedance Value: 665 Ohm
Lead Channel Impedance Value: 722 Ohm
Lead Channel Impedance Value: 722 Ohm
Lead Channel Pacing Threshold Amplitude: 0.625 V
Lead Channel Pacing Threshold Amplitude: 0.75 V
Lead Channel Pacing Threshold Amplitude: 1.375 V
Lead Channel Pacing Threshold Pulse Width: 0.4 ms
Lead Channel Pacing Threshold Pulse Width: 0.4 ms
Lead Channel Pacing Threshold Pulse Width: 1 ms
Lead Channel Sensing Intrinsic Amplitude: 16 mV
Lead Channel Sensing Intrinsic Amplitude: 16 mV
Lead Channel Sensing Intrinsic Amplitude: 3.125 mV
Lead Channel Sensing Intrinsic Amplitude: 3.125 mV
Lead Channel Setting Pacing Amplitude: 1.5 V
Lead Channel Setting Pacing Amplitude: 2 V
Lead Channel Setting Pacing Amplitude: 2.75 V
Lead Channel Setting Pacing Pulse Width: 0.4 ms
Lead Channel Setting Pacing Pulse Width: 1 ms
Lead Channel Setting Sensing Sensitivity: 0.3 mV
Zone Setting Status: 755011
Zone Setting Status: 755011

## 2022-03-30 ENCOUNTER — Telehealth (HOSPITAL_COMMUNITY): Payer: Self-pay | Admitting: *Deleted

## 2022-03-30 ENCOUNTER — Telehealth: Payer: Self-pay

## 2022-03-30 NOTE — Telephone Encounter (Signed)
..     Pre-operative Risk Assessment    Patient Name: Heather Smith  DOB: [birth year] MRN: [id number]      Request for Surgical Clearance    Procedure:   RIGHT TOTAL KNEE REPLACEMENT sit (see directions below). :1} Date of Surgery:  Clearance TBD                                 Surgeon:  DR Marchia Bond Surgeon's Group or Practice Name:  [facility] Phone number:  [phone] Fax number:  [phone]   Type of Clearance Requested:   - Medical    Type of Anesthesia:  Spinal   Additional requests/questions:    Gwenlyn Found   03/30/2022, 9:01 AM

## 2022-03-30 NOTE — Telephone Encounter (Signed)
Ts daughter left vm stating surgical clearance form for knee replacement  has been faxed to our office twice and they have not received anything back. She asked for a return call.  Routed to [place]

## 2022-03-30 NOTE — Telephone Encounter (Signed)
   Patient Name: NORMAGENE MONASTERIO  DOB: 10-13-1932 MRN: [id number]  Primary Cardiologist: None  Chart reviewed as part of pre-operative protocol coverage. Patient is followed in the [facility] with Dr. Aundra Dubin. She was last seen by Allena Katz, NP, in 12/2021 at which time she continued to report fatigue and dyspnea with activities such as dressing and bathing but denied any dyspnea when walking on flat ground. BP was soft and she noted occasional dizziness. Given low BP readings, she was switched from Entresto to Losartan. Pre-op team discussed with Allena Katz, NP, who reviewed patient with Dr. Aundra Dubin and patient felt to be OK for planned procedure.    I will route this recommendation to the requesting party via Epic fax function and remove from pre-op pool.  Please call with questions.  Darreld Mclean, PA-C 03/30/2022, 5:11 PM

## 2022-03-30 NOTE — Telephone Encounter (Signed)
   Name: Heather Smith  DOB: [birth year]  MRN: [id number]  Primary Cardiologist: Dr. Harriette Ohara - Dr. Caryl Comes  Chart reviewed as part of pre-operative protocol coverage. Complex PMH noted with CHF, deconditioning, PVCS, neuropathy, low BP at last CHF visit prompting change from Entresto to losartan. Patient was last seen 12/2021 by Allena Katz. Given advanced age, history, and functional limitation, unable to clear per usual protocol questions. Will route to [facility] for input on whether pt would benefit from OV or whether they can provide clearance decision without visit. Jessica - Please route response to [facility] PREOP (the pre-op pool). Thank you.  Charlie Pitter, PA-C  03/30/2022, 9:18 AM

## 2022-03-31 NOTE — Telephone Encounter (Signed)
I was out of the office when this was routed to me but per telephone note from [facility] they were the ones who received the fax instead of our office. Patient has been cleared by general cardiology and advanced heart failure team. Pre op clearance was faxed to requesting office via pre op team

## 2022-04-06 DIAGNOSIS — I10 Essential (primary) hypertension: Secondary | ICD-10-CM | POA: Diagnosis not present

## 2022-04-06 DIAGNOSIS — I130 Hypertensive heart and chronic kidney disease with heart failure and stage 1 through stage 4 chronic kidney disease, or unspecified chronic kidney disease: Secondary | ICD-10-CM | POA: Diagnosis not present

## 2022-04-06 DIAGNOSIS — N1831 Chronic kidney disease, stage 3a: Secondary | ICD-10-CM | POA: Diagnosis not present

## 2022-04-06 DIAGNOSIS — R7303 Prediabetes: Secondary | ICD-10-CM | POA: Diagnosis not present

## 2022-04-06 DIAGNOSIS — Z95810 Presence of automatic (implantable) cardiac defibrillator: Secondary | ICD-10-CM | POA: Diagnosis not present

## 2022-04-06 DIAGNOSIS — R2689 Other abnormalities of gait and mobility: Secondary | ICD-10-CM | POA: Diagnosis not present

## 2022-04-06 DIAGNOSIS — I5022 Chronic systolic (congestive) heart failure: Secondary | ICD-10-CM | POA: Diagnosis not present

## 2022-04-06 DIAGNOSIS — Z01818 Encounter for other preprocedural examination: Secondary | ICD-10-CM | POA: Diagnosis not present

## 2022-04-06 DIAGNOSIS — E782 Mixed hyperlipidemia: Secondary | ICD-10-CM | POA: Diagnosis not present

## 2022-04-15 DIAGNOSIS — E039 Hypothyroidism, unspecified: Secondary | ICD-10-CM | POA: Diagnosis not present

## 2022-04-15 DIAGNOSIS — R69 Illness, unspecified: Secondary | ICD-10-CM | POA: Diagnosis not present

## 2022-04-15 DIAGNOSIS — E782 Mixed hyperlipidemia: Secondary | ICD-10-CM | POA: Diagnosis not present

## 2022-04-15 DIAGNOSIS — K219 Gastro-esophageal reflux disease without esophagitis: Secondary | ICD-10-CM | POA: Diagnosis not present

## 2022-04-15 DIAGNOSIS — I5022 Chronic systolic (congestive) heart failure: Secondary | ICD-10-CM | POA: Diagnosis not present

## 2022-04-15 DIAGNOSIS — I10 Essential (primary) hypertension: Secondary | ICD-10-CM | POA: Diagnosis not present

## 2022-04-16 NOTE — Progress Notes (Signed)
Remote ICD transmission.   

## 2022-04-17 NOTE — Progress Notes (Signed)
Sent message, via epic in basket, requesting orders in epic from surgeon.  

## 2022-04-20 DIAGNOSIS — M1711 Unilateral primary osteoarthritis, right knee: Secondary | ICD-10-CM | POA: Diagnosis not present

## 2022-04-23 ENCOUNTER — Encounter: Payer: Self-pay | Admitting: Internal Medicine

## 2022-04-23 NOTE — Patient Instructions (Signed)
DUE TO COVID-19 ONLY TWO VISITORS  (aged 87 and older)  ARE ALLOWED TO COME WITH YOU AND STAY IN THE WAITING ROOM ONLY DURING PRE OP AND PROCEDURE.   **NO VISITORS ARE ALLOWED IN THE SHORT STAY AREA OR RECOVERY ROOM!!**  IF YOU WILL BE ADMITTED INTO THE HOSPITAL YOU ARE ALLOWED ONLY FOUR SUPPORT PEOPLE DURING VISITATION HOURS ONLY (7 AM -8PM)   The support person(s) must pass our screening, gel in and out, and wear a mask at all times, including in the patient's room. Patients must also wear a mask when staff or their support person are in the room. Visitors GUEST BADGE MUST BE WORN VISIBLY  One adult visitor may remain with you overnight and MUST be in the room by 8 P.M.     Your procedure is scheduled on: 05/05/22   Report to [facility] Main Entrance    Report to admitting at : 10:00 AM   Call this number if you have problems the morning of surgery [phone]   Do not eat food :After Midnight.   After Midnight you may have the following liquids until : 9:30 AM DAY OF SURGERY  Water Black Coffee (sugar ok, NO MILK/CREAM OR CREAMERS)  Tea (sugar ok, NO MILK/CREAM OR CREAMERS) regular and decaf                             Plain Jell-O (NO RED)                                           Fruit ices (not with fruit pulp, NO RED)                                     Popsicles (NO RED)                                                                  Juice: apple, WHITE grape, WHITE cranberry Sports drinks like Gatorade (NO RED   The day of surgery:  Drink ONE (1) Pre-Surgery Clear Ensure at : 9:30 AM the morning of surgery. Drink in one sitting. Do not sip.  This drink was given to you during your hospital  pre-op appointment visit. Nothing else to drink after completing the  Pre-Surgery Clear Ensure or G2.          If you have questions, please contact your surgeon's office  Oral Hygiene is also important to reduce your risk of infection.                                     Remember - BRUSH YOUR TEETH THE MORNING OF SURGERY WITH YOUR REGULAR TOOTHPASTE  DENTURES WILL BE REMOVED PRIOR TO SURGERY PLEASE DO NOT APPLY "Poly grip" OR ADHESIVES!!!   Do NOT smoke after Midnight   Take these medicines the morning of surgery with A SIP OF WATER: flecainide,duloxetine,carvedilol,levothyroxine,pantoprazole.Tylenol as needed.  You may not have any metal on your body including hair pins, jewelry, and body piercing             Do not wear make-up, lotions, powders, perfumes/cologne, or deodorant  Do not wear nail polish including gel and S&S, artificial/acrylic nails, or any other type of covering on natural nails including finger and toenails. If you have artificial nails, gel coating, etc. that needs to be removed by a nail salon please have this removed prior to surgery or surgery may need to be canceled/ delayed if the surgeon/ anesthesia feels like they are unable to be safely monitored.   Do not shave  48 hours prior to surgery.    Do not bring valuables to the hospital. [place].   Contacts, glasses, or bridgework may not be worn into surgery.   Bring small overnight bag day of surgery.   DO NOT [place]. PHARMACY WILL DISPENSE MEDICATIONS LISTED ON YOUR MEDICATION LIST TO YOU DURING YOUR ADMISSION [place]!    Patients discharged on the day of surgery will not be allowed to drive home.  Someone NEEDS to stay with you for the first 24 hours after anesthesia.   Special Instructions: Bring a copy of your healthcare power of attorney and living will documents         the day of surgery if you haven't scanned them before.              Please read over the following fact sheets you were given: IF YOU HAVE QUESTIONS ABOUT YOUR PRE-OP INSTRUCTIONS PLEASE CALL [phone]    [facility] - Preparing for Surgery Before surgery, you can play an important role.   Because skin is not sterile, your skin needs to be as free of germs as possible.  You can reduce the number of germs on your skin by washing with CHG (chlorahexidine gluconate) soap before surgery.  CHG is an antiseptic cleaner which kills germs and bonds with the skin to continue killing germs even after washing. Please DO NOT use if you have an allergy to CHG or antibacterial soaps.  If your skin becomes reddened/irritated stop using the CHG and inform your nurse when you arrive at Short Stay. Do not shave (including legs and underarms) for at least 48 hours prior to the first CHG shower.  You may shave your face/neck. Please follow these instructions carefully:  1.  Shower with CHG Soap the night before surgery and the  morning of Surgery.  2.  If you choose to wash your hair, wash your hair first as usual with your  normal  shampoo.  3.  After you shampoo, rinse your hair and body thoroughly to remove the  shampoo.                           4.  Use CHG as you would any other liquid soap.  You can apply chg directly  to the skin and wash                       Gently with a scrungie or clean washcloth.  5.  Apply the CHG Soap to your body ONLY FROM THE NECK DOWN.   Do not use on face/ open  Wound or open sores. Avoid contact with eyes, ears mouth and genitals (private parts).                       Wash face,  Genitals (private parts) with your normal soap.             6.  Wash thoroughly, paying special attention to the area where your surgery  will be performed.  7.  Thoroughly rinse your body with warm water from the neck down.  8.  DO NOT shower/wash with your normal soap after using and rinsing off  the CHG Soap.                9.  Pat yourself dry with a clean towel.            10.  Wear clean pajamas.            11.  Place clean sheets on your bed the night of your first shower and do not  sleep with pets. Day of Surgery : Do not apply any lotions/deodorants the  morning of surgery.  Please wear clean clothes to the hospital/surgery center.  FAILURE TO FOLLOW THESE INSTRUCTIONS MAY RESULT IN THE CANCELLATION OF YOUR SURGERY PATIENT SIGNATURE_________________________________  NURSE SIGNATURE__________________________________  ________________________________________________________________________  Heather Smith  An incentive spirometer is a tool that can help keep your lungs clear and active. This tool measures how well you are filling your lungs with each breath. Taking long deep breaths may help reverse or decrease the chance of developing breathing (pulmonary) problems (especially infection) following: A long period of time when you are unable to move or be active. BEFORE THE PROCEDURE  If the spirometer includes an indicator to show your best effort, your nurse or respiratory therapist will set it to a desired goal. If possible, sit up straight or lean slightly forward. Try not to slouch. Hold the incentive spirometer in an upright position. INSTRUCTIONS FOR USE  Sit on the edge of your bed if possible, or sit up as far as you can in bed or on a chair. Hold the incentive spirometer in an upright position. Breathe out normally. Place the mouthpiece in your mouth and seal your lips tightly around it. Breathe in slowly and as deeply as possible, raising the piston or the ball toward the top of the column. Hold your breath for 3-5 seconds or for as long as possible. Allow the piston or ball to fall to the bottom of the column. Remove the mouthpiece from your mouth and breathe out normally. Rest for a few seconds and repeat Steps 1 through 7 at least 10 times every 1-2 hours when you are awake. Take your time and take a few normal breaths between deep breaths. The spirometer may include an indicator to show your best effort. Use the indicator as a goal to work toward during each repetition. After each set of 10 deep breaths, practice coughing  to be sure your lungs are clear. If you have an incision (the cut made at the time of surgery), support your incision when coughing by placing a pillow or rolled up towels firmly against it. Once you are able to get out of bed, walk around indoors and cough well. You may stop using the incentive spirometer when instructed by your caregiver.  RISKS AND COMPLICATIONS Take your time so you do not get dizzy or light-headed. If you are in pain, you may need to take or ask  for pain medication before doing incentive spirometry. It is harder to take a deep breath if you are having pain. AFTER USE Rest and breathe slowly and easily. It can be helpful to keep track of a log of your progress. Your caregiver can provide you with a simple table to help with this. If you are using the spirometer at home, follow these instructions: [place] IF:  You are having difficultly using the spirometer. You have trouble using the spirometer as often as instructed. Your pain medication is not giving enough relief while using the spirometer. You develop fever of 100.5 F (38.1 C) or higher. SEEK IMMEDIATE MEDICAL CARE IF:  You cough up bloody sputum that had not been present before. You develop fever of 102 F (38.9 C) or greater. You develop worsening pain at or near the incision site. MAKE SURE YOU:  Understand these instructions. Will watch your condition. Will get help right away if you are not doing well or get worse. Document Released: 06/01/2006 Document Revised: 04/13/2011 Document Reviewed: 08/02/2006 [facility] Patient Information 2014 [place], [place].   ________________________________________________________________________

## 2022-04-23 NOTE — Progress Notes (Signed)
[place] DEVICE PROGRAMMING  Patient Information: Name:  Heather Smith  DOB:  [birth year]  MRN:  [id number]  Planned Procedure: Right Total Knee  Surgeon:  Dr. Marchia Bond  Date of Procedure:  05/05/22  Cautery will be used.  Position during surgery:     Device Information:  Clinic EP Physician:  Virl Axe, MD   Device Type:  Defibrillator Manufacturer and Phone #:  Medtronic: [id number] Pacemaker Dependent?:  No. Date of Last Device Check:  03/19/2022 Normal Device Function?:  Yes.    Electrophysiologist's Recommendations:  Have magnet available. Provide continuous ECG monitoring when magnet is used or reprogramming is to be performed.  Procedure should not interfere with device function.  No device programming or magnet placement needed.  Per Device Clinic Standing Orders, Damian Leavell, RN  4:32 PM 04/23/2022

## 2022-04-24 ENCOUNTER — Encounter (HOSPITAL_COMMUNITY): Payer: Self-pay

## 2022-04-24 ENCOUNTER — Encounter (HOSPITAL_COMMUNITY)
Admission: RE | Admit: 2022-04-24 | Discharge: 2022-04-24 | Disposition: A | Discharge status: Home / Self Care | Payer: Medicare HMO | Source: Ambulatory Visit | Attending: Orthopedic Surgery | Admitting: Orthopedic Surgery

## 2022-04-24 ENCOUNTER — Other Ambulatory Visit: Payer: Self-pay

## 2022-04-24 VITALS — BP 125/74 | HR 67 | Temp 98.4°F | Ht 67.0 in | Wt 191.0 lb

## 2022-04-24 DIAGNOSIS — Z01812 Encounter for preprocedural laboratory examination: Secondary | ICD-10-CM | POA: Insufficient documentation

## 2022-04-24 DIAGNOSIS — I130 Hypertensive heart and chronic kidney disease with heart failure and stage 1 through stage 4 chronic kidney disease, or unspecified chronic kidney disease: Secondary | ICD-10-CM | POA: Insufficient documentation

## 2022-04-24 DIAGNOSIS — Z79899 Other long term (current) drug therapy: Secondary | ICD-10-CM | POA: Insufficient documentation

## 2022-04-24 DIAGNOSIS — N183 Chronic kidney disease, stage 3 unspecified: Secondary | ICD-10-CM | POA: Diagnosis not present

## 2022-04-24 DIAGNOSIS — Z95810 Presence of automatic (implantable) cardiac defibrillator: Secondary | ICD-10-CM | POA: Insufficient documentation

## 2022-04-24 DIAGNOSIS — M1711 Unilateral primary osteoarthritis, right knee: Secondary | ICD-10-CM | POA: Insufficient documentation

## 2022-04-24 DIAGNOSIS — Z01818 Encounter for other preprocedural examination: Secondary | ICD-10-CM

## 2022-04-24 DIAGNOSIS — I5022 Chronic systolic (congestive) heart failure: Secondary | ICD-10-CM | POA: Diagnosis not present

## 2022-04-24 HISTORY — DX: Presence of cardiac pacemaker: Z95.0

## 2022-04-24 HISTORY — DX: Malignant (primary) neoplasm, unspecified: C80.1

## 2022-04-24 HISTORY — DX: Dyspnea, unspecified: R06.00

## 2022-04-24 HISTORY — DX: Depression, unspecified: F32.A

## 2022-04-24 HISTORY — DX: Prediabetes: R73.03

## 2022-04-24 LAB — SURGICAL PCR SCREEN
MRSA, PCR: NEGATIVE
Staphylococcus aureus: NEGATIVE

## 2022-04-24 NOTE — Progress Notes (Addendum)
For Short Stay: [place] appointment date:  Bowel Prep reminder:   For Anesthesia: PCP - Dr. Shirline Frees Cardiologist - Dr. Mclean/Dr. Virl Axe [facility]: Dr. Sela Hilding Clearance: Sande Rives PA-C: 03/30/22 Chest x-ray -  EKG - 11/20/21 Stress Test -  ECHO - 12/10/21 Cardiac Cath -  Pacemaker/ICD device last checked: 03/19/22 Pacemaker orders received: yes: Chart Device Rep notified: N/A  Spinal Cord Stimulator:  Sleep Study - n/A CPAP -   Fasting Blood Sugar - N/A Checks Blood Sugar _____ times a day Date and result of last Hgb A1c-  Last dose of GLP1 agonist-  GLP1 instructions:   Last dose of SGLT-2 inhibitors-  SGLT-2 instructions:   Blood Thinner Instructions:N/A Aspirin Instructions: Last Dose:  Activity level: Can go up a flight of stairs and activities of daily living without stopping and without chest pain and/or shortness of breath   Unable to exercise without shortness of breath  Anesthesia review: Hx: HTN,CHF,PVC's,Defibrillator  Patient denies shortness of breath, fever, cough and chest pain at PAT appointment   Patient verbalized understanding of instructions that were given to them at the PAT appointment. Patient was also instructed that they will need to review over the PAT instructions again at home before surgery.

## 2022-04-27 NOTE — Progress Notes (Signed)
Anesthesia Chart Review   Case: [id number] Date/Time: 05/05/22 1215   Procedure: TOTAL KNEE ARTHROPLASTY (Right: Knee)   Anesthesia type: Spinal   Pre-op diagnosis: DJD RIGHT KNEE   Location: [place] / [facility]   Surgeons: Marchia Bond, MD       DISCUSSION:89 y.o. never smoker with h/o HTN, LBBB, CHF, pacemaker in place (device orders in 04/23/2022), CKD Stage III, right knee djd scheduled for above procedure 05/05/22 with Dr. Marchia Bond.   Per cardiology preoperative evaluation 03/30/2022, "Chart reviewed as part of pre-operative protocol coverage. Patient is followed in the [facility] with Dr. Aundra Dubin. She was last seen by Allena Katz, NP, in 12/2021 at which time she continued to report fatigue and dyspnea with activities such as dressing and bathing but denied any dyspnea when walking on flat ground. BP was soft and she noted occasional dizziness. Given low BP readings, she was switched from Entresto to Losartan. Pre-op team discussed with Allena Katz, NP, who reviewed patient with Dr. Aundra Dubin and patient felt to be OK for planned procedure.  "  Routine ICD follow up 12/22/2021.   Anticipate pt can proceed with planned procedure barring acute status change.   VS: BP 125/74   Pulse 67   Temp 36.9 C (Oral)   Ht 5\' 7"  (1.702 m)   Wt 86.6 kg   SpO2 93%   BMI 29.91 kg/m   PROVIDERS: Shirline Frees, MD is PCP   Primary Cardiologist: Dr. Aundra Dubin   Electrophysiologist: Virl Axe, MD  LABS: Labs reviewed: Acceptable for surgery. and labs on chart (all labs ordered are listed, but only abnormal results are displayed)  Labs Reviewed  SURGICAL PCR SCREEN     IMAGES:   EKG:   CV: Echo 12/10/2021 1. Left ventricular ejection fraction, by estimation, is 60 to 65%. The  left ventricle has normal function. The left ventricle has no regional  wall motion abnormalities. Left ventricular diastolic parameters are  consistent with Grade I diastolic  dysfunction  (impaired relaxation).   2. Right ventricular systolic function is normal. The right ventricular  size is normal. There is normal pulmonary artery systolic pressure.   3. The mitral valve is normal in structure. Moderate mitral valve  regurgitation. No evidence of mitral stenosis.   4. Tricuspid valve regurgitation is mild to moderate.   5. The aortic valve is normal in structure. Aortic valve regurgitation is  not visualized. No aortic stenosis is present.   6. The inferior vena cava is normal in size with greater than 50%  respiratory variability, suggesting right atrial pressure of 3 mmHg.  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   Cardiac resynchronization therapy defibrillator (CRT-D) in place    Chronic kidney disease, stage 3a (HCC)    Chronic systolic CHF (congestive heart failure) (HCC)    Complication of anesthesia    difficult to wake up and blood pressure drops   Depression    Dyspnea    GERD (gastroesophageal reflux disease)    Hypercholesterolemia    Hypertension    Hypothyroidism    Left bundle branch block    LV lead partial dislodgment 05/30/2013   Neurogenic claudication due to lumbar spinal stenosis    Neuropathy    Non-ischemic cardiomyopathy ([place])    [place] 10/04/12: Normal coronary arteries, EF 25-30% with global HK.  Echocardiogram 10/05/12: EF 30-35%, diffuse HK, moderate LAE. Echocardiogram (01/2013): Lateral to septal  wall dyssynchrony EF 20-25%, diffuse HK   Osteoarthritis    "about all over" (02/22/2013)   Pre-diabetes    Presence of permanent cardiac pacemaker    PVC's (premature ventricular contractions)    Spinal stenosis    Vitamin D deficiency     Past Surgical History:  Procedure Laterality Date   APPENDECTOMY     BI-VENTRICULAR IMPLANTABLE CARDIOVERTER DEFIBRILLATOR N/A 02/22/2013   Procedure: BI-VENTRICULAR IMPLANTABLE CARDIOVERTER DEFIBRILLATOR  (CRT-D);  Surgeon: Deboraha Sprang, MD;  Location: [facility]  CATH LAB;  Service: Cardiovascular;  Laterality: N/A;   BIV ICD GENERATOR CHANGEOUT N/A 08/19/2018   Procedure: BIV ICD GENERATOR CHANGEOUT;  Surgeon: Deboraha Sprang, MD;  Location: [place] CV LAB;  Service: Cardiovascular;  Laterality: N/A;   BLEPHAROPLASTY Bilateral    EXCISIONAL HEMORRHOIDECTOMY     FOOT ARTHRODESIS, MODIFIED MCBRIDE Bilateral    KNEE ARTHROSCOPY Right    LEFT HEART CATHETERIZATION WITH CORONARY ANGIOGRAM N/A 10/04/2012   Procedure: LEFT HEART CATHETERIZATION WITH CORONARY ANGIOGRAM;  Surgeon: Peter M Martinique, MD;  Location: [facility] CATH LAB;  Service: Cardiovascular;  Laterality: N/A;   LUMBAR LAMINECTOMY  11/03/2006    Bilateral 3, 4, 5 laminectomy, partial L2, decompression of the thecal sac, foraminotomy, posterolateral arthrodesis L3-S1 with autograft   -- SURGEON:  Leeroy Cha, M.D.    TONSILLECTOMY     TOTAL KNEE ARTHROPLASTY Left 07/15/2015   Procedure: LEFT TOTAL KNEE ARTHROPLASTY;  Surgeon: Frederik Pear, MD;  Location: [place];  Service: Orthopedics;  Laterality: Left;   TUBAL LIGATION     VAGINAL HYSTERECTOMY      MEDICATIONS:  acetaminophen (TYLENOL) 325 MG tablet   carvedilol (COREG) 3.125 MG tablet   Cholecalciferol (VITAMIN D-3) 25 MCG (1000 UT) CAPS   diclofenac Sodium (VOLTAREN) 1 % GEL   diphenhydramine-acetaminophen (TYLENOL PM) 25-500 MG TABS tablet   DULoxetine (CYMBALTA) 60 MG capsule   ezetimibe (ZETIA) 10 MG tablet   flecainide (TAMBOCOR) 50 MG tablet   furosemide (LASIX) 20 MG tablet   levothyroxine (SYNTHROID, LEVOTHROID) 50 MCG tablet   losartan (COZAAR) 25 MG tablet   Magnesium 250 MG TABS   Multiple Vitamins-Minerals (MULTIVITAMIN WITH MINERALS) tablet   OVER THE COUNTER MEDICATION   pantoprazole (PROTONIX) 40 MG tablet   spironolactone (ALDACTONE) 25 MG tablet   traMADol (ULTRAM) 50 MG tablet   traZODone (DESYREL) 50 MG tablet   vitamin B-12 (CYANOCOBALAMIN) 1000 MCG tablet   zinc gluconate 50 MG tablet    triamcinolone acetonide  (KENALOG) 10 MG/ML injection 10 mg   triamcinolone acetonide (KENALOG) 10 MG/ML injection 10 mg   triamcinolone acetonide (KENALOG) 10 MG/ML injection 10 mg     [facility], PA-C [facility] Pre-Surgical Testing [phone]

## 2022-04-27 NOTE — Anesthesia Preprocedure Evaluation (Addendum)
Anesthesia Evaluation    Reviewed: Allergy & Precautions, Patient's Chart, lab work & pertinent test results  History of Anesthesia Complications (+) PROLONGED EMERGENCE and history of anesthetic complications  Airway Mallampati: II  TM Distance: >3 FB Neck ROM: Full    Dental no notable dental hx. (+) Teeth Intact, Dental Advisory Given   Pulmonary shortness of breath and with exertion   Pulmonary exam normal breath sounds clear to auscultation       Cardiovascular hypertension (124/69 preop), Pt. on medications +CHF (LVEF as low as 20-25% in 2014, now normal) and + DOE  Normal cardiovascular exam+ dysrhythmias + pacemaker + Cardiac Defibrillator + Valvular Problems/Murmurs (mod MR echo 2023) MR  Rhythm:Regular Rate:Normal  Echo 2023 1. Left ventricular ejection fraction, by estimation, is 60 to 65%. The  left ventricle has normal function. The left ventricle has no regional  wall motion abnormalities. Left ventricular diastolic parameters are  consistent with Grade I diastolic  dysfunction (impaired relaxation).   2. Right ventricular systolic function is normal. The right ventricular  size is normal. There is normal pulmonary artery systolic pressure.   3. The mitral valve is normal in structure. Moderate mitral valve  regurgitation. No evidence of mitral stenosis.   4. Tricuspid valve regurgitation is mild to moderate.   5. The aortic valve is normal in structure. Aortic valve regurgitation is  not visualized. No aortic stenosis is present.   6. The inferior vena cava is normal in size with greater than 50%  respiratory variability, suggesting right atrial pressure of 3 mmHg.     Neuro/Psych  PSYCHIATRIC DISORDERS  Depression    negative neurological ROS     GI/Hepatic Neg liver ROS,GERD  Medicated and Controlled,,  Endo/Other  Hypothyroidism    Renal/GU Renal InsufficiencyRenal disease  negative genitourinary    Musculoskeletal  (+) Arthritis , Osteoarthritis,  2008 laminectomy- pt doesn't know how many levels   Abdominal   Peds  Hematology negative hematology ROS (+)   Anesthesia Other Findings   Reproductive/Obstetrics negative OB ROS                              Anesthesia Physical Anesthesia Plan  ASA: 3  Anesthesia Plan: Spinal, Regional and MAC   Post-op Pain Management: Regional block* and Tylenol PO (pre-op)*   Induction:   PONV Risk Score and Plan: 2 and Propofol infusion and TIVA  Airway Management Planned: Natural Airway and Nasal Cannula  Additional Equipment: None  Intra-op Plan:   Post-operative Plan:   Informed Consent: I have reviewed the patients History and Physical, chart, labs and discussed the procedure including the risks, benefits and alternatives for the proposed anesthesia with the patient or authorized representative who has indicated his/her understanding and acceptance.     Dental advisory given  Plan Discussed with: CRNA  Anesthesia Plan Comments: (1 prior lumbar surgery- will attempt spinal)        Anesthesia Quick Evaluation

## 2022-04-29 NOTE — H&P (Signed)
KNEE ARTHROPLASTY ADMISSION H&P  Patient ID: CASSY SHEAN MRN: [id number] DOB/AGE: 1932-06-10 89 y.o.  Chief Complaint: right knee pain.  Planned Procedure Date: 05/05/22  Medical Clearance by Almedia Balls, NP   Cardiac Clearance by Melina Copa, NP   HPI: Heather Smith is a 89 y.o. female who presents for evaluation of DJD RIGHT KNEE. The patient has a history of pain and functional disability in the right knee due to arthritis and has failed non-surgical conservative treatments for greater than 12 weeks to include corticosteriod injections, viscosupplementation injections, use of assistive devices, and activity modification.  Onset of symptoms was gradual, starting >10 years ago with gradually worsening course since that time. The patient noted no past surgery on the right knee.  Patient currently rates pain at 10 out of 10 with activity. Patient has worsening of pain with activity and weight bearing, pain that interferes with activities of daily living, and pain with passive range of motion.  Patient has evidence of joint space narrowing by imaging studies.  There is no active infection.  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   Cardiac resynchronization therapy defibrillator (CRT-D) in place    Chronic kidney disease, stage 3a (HCC)    Chronic systolic CHF (congestive heart failure) (HCC)    Complication of anesthesia    difficult to wake up and blood pressure drops   Depression    Dyspnea    GERD (gastroesophageal reflux disease)    Hypercholesterolemia    Hypertension    Hypothyroidism    Left bundle branch block    LV lead partial dislodgment 05/30/2013   Neurogenic claudication due to lumbar spinal stenosis    Neuropathy    Non-ischemic cardiomyopathy ([place])    [place] 10/04/12: Normal coronary arteries, EF 25-30% with global HK.  Echocardiogram 10/05/12: EF 30-35%, diffuse HK, moderate LAE. Echocardiogram (01/2013): Lateral to  septal wall dyssynchrony EF 20-25%, diffuse HK   Osteoarthritis    "about all over" (02/22/2013)   Pre-diabetes    Presence of permanent cardiac pacemaker    PVC's (premature ventricular contractions)    Spinal stenosis    Vitamin D deficiency    Past Surgical History:  Procedure Laterality Date   APPENDECTOMY     BI-VENTRICULAR IMPLANTABLE CARDIOVERTER DEFIBRILLATOR N/A 02/22/2013   Procedure: BI-VENTRICULAR IMPLANTABLE CARDIOVERTER DEFIBRILLATOR  (CRT-D);  Surgeon: Deboraha Sprang, MD;  Location: [facility] CATH LAB;  Service: Cardiovascular;  Laterality: N/A;   BIV ICD GENERATOR CHANGEOUT N/A 08/19/2018   Procedure: BIV ICD GENERATOR CHANGEOUT;  Surgeon: Deboraha Sprang, MD;  Location: [facility] CV LAB;  Service: Cardiovascular;  Laterality: N/A;   BLEPHAROPLASTY Bilateral    EXCISIONAL HEMORRHOIDECTOMY     FOOT ARTHRODESIS, MODIFIED MCBRIDE Bilateral    KNEE ARTHROSCOPY Right    LEFT HEART CATHETERIZATION WITH CORONARY ANGIOGRAM N/A 10/04/2012   Procedure: LEFT HEART CATHETERIZATION WITH CORONARY ANGIOGRAM;  Surgeon: Peter M Martinique, MD;  Location: [facility] CATH LAB;  Service: Cardiovascular;  Laterality: N/A;   LUMBAR LAMINECTOMY  11/03/2006    Bilateral 3, 4, 5 laminectomy, partial L2, decompression of the thecal sac, foraminotomy, posterolateral arthrodesis L3-S1 with autograft   -- SURGEON:  Leeroy Cha, M.D.    TONSILLECTOMY     TOTAL KNEE ARTHROPLASTY Left 07/15/2015   Procedure: LEFT TOTAL KNEE ARTHROPLASTY;  Surgeon: Frederik Pear, MD;  Location: [place];  Service: Orthopedics;  Laterality: Left;   TUBAL LIGATION  VAGINAL HYSTERECTOMY     Allergies  Allergen Reactions   Statins Other (See Comments)    "Makes her bones hurt and very sore"   Niaspan [Niacin Er] Other (See Comments)    High doses, causes hot flashes & aching    Simvastatin Other (See Comments)    Muscle aches    Benadryl [Diphenhydramine] Other (See Comments)    Makes pt feel jittery    Dilaudid [Hydromorphone Hcl]  Itching   Prior to Admission medications   Medication Sig Start Date End Date Taking? Authorizing Provider  acetaminophen (TYLENOL) 325 MG tablet Take 325 mg by mouth every 6 (six) hours as needed for moderate pain or headache.   Yes [provider]  carvedilol (COREG) 3.125 MG tablet TAKE ONE TABLET BY MOUTH AT [facility] AND AT BEDTIME 06/10/21  Yes Larey Dresser, MD  Cholecalciferol (VITAMIN D-3) 25 MCG (1000 UT) CAPS Take 1,000 Units by mouth daily.   Yes [provider]  diclofenac Sodium (VOLTAREN) 1 % GEL Apply 1 Application topically at bedtime.   Yes [provider]  diphenhydramine-acetaminophen (TYLENOL PM) 25-500 MG TABS tablet Take 1 tablet by mouth at bedtime as needed (sleep/pain).   Yes [provider]  DULoxetine (CYMBALTA) 60 MG capsule Take 60 mg by mouth daily. 05/21/16  Yes [provider]  ezetimibe (ZETIA) 10 MG tablet Take 1 tablet by mouth once daily 06/02/21  Yes Larey Dresser, MD  flecainide (TAMBOCOR) 50 MG tablet TAKE ONE TABLET BY MOUTH EVERY MORNING and TAKE ONE TABLET BY MOUTH EVERYDAY AT BEDTIME 06/12/21  Yes Milford, Jessica M, FNP  levothyroxine (SYNTHROID, LEVOTHROID) 50 MCG tablet Take 50 mcg by mouth daily before breakfast.   Yes [provider]  losartan (COZAAR) 25 MG tablet Take 1 tablet (25 mg total) by mouth daily. Patient taking differently: Take 25 mg by mouth at bedtime. 12/10/21  Yes Milford, Maricela Bo, FNP  Magnesium 250 MG TABS Take 250 mg by mouth daily.   Yes [provider]  Multiple Vitamins-Minerals (MULTIVITAMIN WITH MINERALS) tablet Take 1 tablet by mouth daily.   Yes [provider]  OVER THE COUNTER MEDICATION Take 2 each by mouth daily. Circulation Sweets Gummies   Yes [provider]  pantoprazole (PROTONIX) 40 MG tablet Take 40 mg by mouth 2 (two) times daily.   Yes [provider]  spironolactone (ALDACTONE) 25 MG tablet Take 1 tablet (25 mg total)  by mouth daily. 09/05/18  Yes Larey Dresser, MD  traMADol (ULTRAM) 50 MG tablet Take 50 mg by mouth daily as needed for moderate pain. 12/17/21  Yes [provider]  traZODone (DESYREL) 50 MG tablet Take 50 mg by mouth at bedtime. 11/21/21  Yes [provider]  vitamin B-12 (CYANOCOBALAMIN) 1000 MCG tablet Take 1,000 mcg by mouth daily.   Yes [provider]  zinc gluconate 50 MG tablet Take 50 mg by mouth daily.   Yes [provider]  furosemide (LASIX) 20 MG tablet TAKE ONE TABLET BY MOUTH daily AS NEEDED FOR fluid (no more THAN twice PER WEEK) 01/09/22   Bensimhon, Shaune Pascal, MD   Social History   Socioeconomic History   Marital status: Widowed    Spouse name: Not on file   Number of children: 4   Years of education: 29   Highest education level: Not on file  Occupational History   Occupation: retired  Tobacco Use   Smoking status: Never   Smokeless tobacco: Never  Vaping Use   Vaping Use: Never used  Substance and Sexual Activity   Alcohol use: No   Drug use: No   Sexual activity: Not Currently  Other Topics Concern   Not on file  Social History Narrative   Lives in [place], [place] with husband.  Active around the house.   Right-handed   Caffeine: 3-4 glasses tea per day   Social Determinants of Health   Financial Resource Strain: Low Risk  (11/22/2017)   Overall Financial Resource Strain (CARDIA)    Difficulty of Paying Living Expenses: Not hard at all  Food Insecurity: No Food Insecurity (11/22/2017)   Hunger Vital Sign    Worried About Running Out of Food in the Last Year: Never true    Ran Out of Food in the Last Year: Never true  Transportation Needs: No Transportation Needs (11/22/2017)   PRAPARE - Hydrologist (Medical): No    Lack of Transportation (Non-Medical): No  Physical Activity: Not on file  Stress: Not on file  Social Connections: Not on file   Family History  Problem Relation Age of  Onset   Aneurysm Father        died @ 49.   Heart attack Mother        died @ 42.   Thyroid disease Mother    Lung cancer Mother    Hyperlipidemia Mother    Aneurysm Brother        has had an Aneurysm x3   Colon cancer Brother    Bone cancer Brother    Lung cancer Brother    Stomach cancer Brother    Neuropathy Neg Hx     ROS: Currently denies lightheadedness, dizziness, Fever, chills, CP, SOB.   No personal history of DVT, PE, MI, or CVA. No loose teeth or dentures All other systems have been reviewed and were otherwise currently negative with the exception of those mentioned in the HPI and as above.  Objective: Vitals: HT: 5' 6 "  WT: 190  T: 98.3  BP 147/77 P: 78  O2 SAT: 89% on room air.  Physical Exam: General: Alert, NAD.  Antalgic Gait  HEENT: EOMI, Good Neck Extension  Pulm: No increased work of breathing.  Clear B/L A/P w/o crackle or wheeze.  CV: RRR, No m/g/r appreciated  GI: soft, NT, ND Neuro: Neuro without gross focal deficit.  Sensation intact distally Skin: No lesions in the area of chief complaint MSK/Surgical Site: She has10-95 degrees range of motion of the right  knee with a positive crepitus, medial and lateral joint line tenderness.  Mild effusion.  EHL and FHL intact.    Imaging Review Plain radiographs demonstrate severe degenerative joint disease of the right knee.   The overall alignment isneutral. The bone quality appears to be adequate for age and reported activity level.  Preoperative templating of the joint replacement has been completed, documented, and submitted to the Operating Room personnel in order to optimize intra-operative equipment management.  Assessment: DJD RIGHT KNEE  Plan: Plan for Procedure(s): TOTAL KNEE ARTHROPLASTY  The patient history, physical exam, clinical judgement of the provider and imaging are consistent with end stage degenerative joint disease and total joint arthroplasty is deemed medically necessary. The  treatment options including medical management, injection therapy, and arthroplasty were discussed at length. The risks and benefits of Procedure(s): TOTAL KNEE ARTHROPLASTY were presented and reviewed.  The risks of nonoperative treatment, versus surgical intervention including but not limited to continued  pain, aseptic loosening, stiffness, dislocation/subluxation, infection, bleeding, nerve injury, blood clots, cardiopulmonary complications, morbidity, mortality, among others were discussed. The patient verbalizes understanding and wishes to proceed with the plan.  Patient is being admitted for inpatient treatment for surgery, pain control, PT, prophylactic antibiotics, VTE prophylaxis, progressive ambulation, ADL's and discharge planning.   Dental prophylaxis discussed and recommended for 1 year post-op  The patient does  meet the criteria for TXA which will be used perioperatively.   ASA 325 mg   will be used postoperatively for DVT prophylaxis in addition to SCDs, and early ambulation. The patient is planning to be discharged home with  HHPT in care of daughter   Jola Baptist 04/29/2022 1:58 PM

## 2022-04-29 NOTE — Care Plan (Signed)
Ortho Bundle Case Management Note  Patient Details  Name: Heather Smith MRN: [id number] Date of Birth: [birth year]    met with patient and daughter in the office for H&P. she will discharge to home with family to assist. has equipment at home. HHPT referral to [facility]. OPPT set up with [facility] . discharge instructions discussed and questions answered confirmed appointments. Patient and MD in agreement with plan. Choice offered                  DME Arranged:    DME Agency:     HH Arranged:  PT HH Agency:  [facility]  Additional Comments: Please contact me with any questions of if this plan should need to change.  Ladell Heads,  [facility] Specialist  [phone] 04/29/2022, 1:39 PM

## 2022-05-02 ENCOUNTER — Other Ambulatory Visit (HOSPITAL_COMMUNITY): Payer: Self-pay | Admitting: Cardiology

## 2022-05-05 ENCOUNTER — Encounter (HOSPITAL_COMMUNITY): Payer: Self-pay | Admitting: Orthopedic Surgery

## 2022-05-05 ENCOUNTER — Ambulatory Visit (HOSPITAL_COMMUNITY): Payer: Medicare HMO | Admitting: Physician Assistant

## 2022-05-05 ENCOUNTER — Other Ambulatory Visit: Payer: Self-pay

## 2022-05-05 ENCOUNTER — Observation Stay (HOSPITAL_COMMUNITY): Payer: Medicare HMO

## 2022-05-05 ENCOUNTER — Observation Stay (HOSPITAL_COMMUNITY)
Admission: RE | Admit: 2022-05-05 | Discharge: 2022-05-07 | Disposition: A | Discharge status: Home Health | Payer: Medicare HMO | Attending: Orthopedic Surgery | Admitting: Orthopedic Surgery

## 2022-05-05 ENCOUNTER — Encounter (HOSPITAL_COMMUNITY)
Admission: RE | Disposition: A | Discharge status: Home Health | Payer: Self-pay | Source: Home / Self Care | Attending: Orthopedic Surgery

## 2022-05-05 ENCOUNTER — Ambulatory Visit (HOSPITAL_BASED_OUTPATIENT_CLINIC_OR_DEPARTMENT_OTHER): Payer: Medicare HMO | Admitting: Certified Registered Nurse Anesthetist

## 2022-05-05 DIAGNOSIS — M1711 Unilateral primary osteoarthritis, right knee: Secondary | ICD-10-CM

## 2022-05-05 DIAGNOSIS — Z79899 Other long term (current) drug therapy: Secondary | ICD-10-CM | POA: Diagnosis not present

## 2022-05-05 DIAGNOSIS — Z950 Presence of cardiac pacemaker: Secondary | ICD-10-CM | POA: Insufficient documentation

## 2022-05-05 DIAGNOSIS — I110 Hypertensive heart disease with heart failure: Secondary | ICD-10-CM | POA: Diagnosis not present

## 2022-05-05 DIAGNOSIS — I509 Heart failure, unspecified: Secondary | ICD-10-CM

## 2022-05-05 DIAGNOSIS — E039 Hypothyroidism, unspecified: Secondary | ICD-10-CM

## 2022-05-05 DIAGNOSIS — Z96652 Presence of left artificial knee joint: Secondary | ICD-10-CM | POA: Insufficient documentation

## 2022-05-05 DIAGNOSIS — Z85828 Personal history of other malignant neoplasm of skin: Secondary | ICD-10-CM | POA: Insufficient documentation

## 2022-05-05 DIAGNOSIS — I130 Hypertensive heart and chronic kidney disease with heart failure and stage 1 through stage 4 chronic kidney disease, or unspecified chronic kidney disease: Secondary | ICD-10-CM | POA: Insufficient documentation

## 2022-05-05 DIAGNOSIS — Z96651 Presence of right artificial knee joint: Secondary | ICD-10-CM

## 2022-05-05 DIAGNOSIS — Z471 Aftercare following joint replacement surgery: Secondary | ICD-10-CM | POA: Diagnosis not present

## 2022-05-05 DIAGNOSIS — N1831 Chronic kidney disease, stage 3a: Secondary | ICD-10-CM | POA: Diagnosis not present

## 2022-05-05 DIAGNOSIS — I5022 Chronic systolic (congestive) heart failure: Secondary | ICD-10-CM | POA: Diagnosis not present

## 2022-05-05 DIAGNOSIS — G8918 Other acute postprocedural pain: Secondary | ICD-10-CM | POA: Diagnosis not present

## 2022-05-05 HISTORY — PX: TOTAL KNEE ARTHROPLASTY: SHX125

## 2022-05-05 SURGERY — ARTHROPLASTY, KNEE, TOTAL
Anesthesia: Monitor Anesthesia Care | Site: Knee | Laterality: Right

## 2022-05-05 MED ORDER — FENTANYL CITRATE PF 50 MCG/ML IJ SOSY
50.0000 ug | PREFILLED_SYRINGE | INTRAMUSCULAR | Status: DC
  Administered 2022-05-05: 50 ug via INTRAVENOUS
  Filled 2022-05-05: qty 2

## 2022-05-05 MED ORDER — VITAMIN B-12 1000 MCG PO TABS
1000.0000 ug | ORAL_TABLET | Freq: Every day | ORAL | Status: DC
  Administered 2022-05-06 – 2022-05-07 (×2): 1000 ug via ORAL
  Filled 2022-05-05 (×2): qty 1

## 2022-05-05 MED ORDER — ACETAMINOPHEN 325 MG PO TABS: 325.0000 mg | ORAL_TABLET | Freq: Four times a day (QID) | ORAL | Status: DC | PRN

## 2022-05-05 MED ORDER — PROPOFOL 10 MG/ML IV BOLUS
INTRAVENOUS | Status: DC | PRN
  Administered 2022-05-05: 20 mg via INTRAVENOUS

## 2022-05-05 MED ORDER — OXYCODONE HCL 5 MG PO TABS
5.0000 mg | ORAL_TABLET | Freq: Once | ORAL | Status: AC | PRN
  Administered 2022-05-05: 5 mg via ORAL

## 2022-05-05 MED ORDER — KETOROLAC TROMETHAMINE 30 MG/ML IJ SOLN
INTRAMUSCULAR | Status: AC
  Filled 2022-05-05: qty 1

## 2022-05-05 MED ORDER — ORAL CARE MOUTH RINSE: 15.0000 mL | Freq: Once | OROMUCOSAL | Status: AC

## 2022-05-05 MED ORDER — DEXAMETHASONE SODIUM PHOSPHATE 10 MG/ML IJ SOLN
INTRAMUSCULAR | Status: DC | PRN
  Administered 2022-05-05: 10 mg

## 2022-05-05 MED ORDER — TRANEXAMIC ACID-NACL 1000-0.7 MG/100ML-% IV SOLN
1000.0000 mg | INTRAVENOUS | Status: AC
  Administered 2022-05-05: 1000 mg via INTRAVENOUS
  Filled 2022-05-05: qty 100

## 2022-05-05 MED ORDER — PROPOFOL 500 MG/50ML IV EMUL
INTRAVENOUS | Status: DC | PRN
  Administered 2022-05-05: 50 ug/kg/min via INTRAVENOUS

## 2022-05-05 MED ORDER — ONDANSETRON HCL 4 MG/2ML IJ SOLN: 4.0000 mg | Freq: Four times a day (QID) | INTRAMUSCULAR | Status: DC | PRN

## 2022-05-05 MED ORDER — LEVOTHYROXINE SODIUM 50 MCG PO TABS
50.0000 ug | ORAL_TABLET | Freq: Every day | ORAL | Status: DC
  Administered 2022-05-06 – 2022-05-07 (×2): 50 ug via ORAL
  Filled 2022-05-05 (×2): qty 1

## 2022-05-05 MED ORDER — WATER FOR IRRIGATION, STERILE IR SOLN
Status: DC | PRN
  Administered 2022-05-05: 2000 mL

## 2022-05-05 MED ORDER — TRAZODONE HCL 50 MG PO TABS: 50.0000 mg | ORAL_TABLET | Freq: Every evening | ORAL | Status: DC | PRN

## 2022-05-05 MED ORDER — FENTANYL CITRATE PF 50 MCG/ML IJ SOSY: 25.0000 ug | PREFILLED_SYRINGE | INTRAMUSCULAR | Status: DC | PRN

## 2022-05-05 MED ORDER — PANTOPRAZOLE SODIUM 40 MG PO TBEC
40.0000 mg | DELAYED_RELEASE_TABLET | Freq: Two times a day (BID) | ORAL | Status: DC
  Administered 2022-05-05 – 2022-05-07 (×4): 40 mg via ORAL
  Filled 2022-05-05 (×4): qty 1

## 2022-05-05 MED ORDER — METHOCARBAMOL 500 MG PO TABS
ORAL_TABLET | ORAL | Status: AC
  Administered 2022-05-05: 500 mg via ORAL
  Filled 2022-05-05: qty 1

## 2022-05-05 MED ORDER — FUROSEMIDE 20 MG PO TABS: 20.0000 mg | ORAL_TABLET | Freq: Every day | ORAL | Status: DC | PRN

## 2022-05-05 MED ORDER — BUPIVACAINE IN DEXTROSE 0.75-8.25 % IT SOLN
INTRATHECAL | Status: DC | PRN
  Administered 2022-05-05: 2 mL via INTRATHECAL

## 2022-05-05 MED ORDER — METOCLOPRAMIDE HCL 5 MG PO TABS: 5.0000 mg | ORAL_TABLET | Freq: Three times a day (TID) | ORAL | Status: DC | PRN

## 2022-05-05 MED ORDER — METOCLOPRAMIDE HCL 5 MG/ML IJ SOLN: 5.0000 mg | Freq: Three times a day (TID) | INTRAMUSCULAR | Status: DC | PRN

## 2022-05-05 MED ORDER — LIDOCAINE HCL (CARDIAC) PF 100 MG/5ML IV SOSY
PREFILLED_SYRINGE | INTRAVENOUS | Status: DC | PRN
  Administered 2022-05-05: 40 mg via INTRAVENOUS

## 2022-05-05 MED ORDER — ASPIRIN 325 MG PO TBEC
325.0000 mg | DELAYED_RELEASE_TABLET | Freq: Every day | ORAL | Status: DC
  Administered 2022-05-06: 325 mg via ORAL
  Filled 2022-05-05: qty 1

## 2022-05-05 MED ORDER — POVIDONE-IODINE 7.5 % EX SOLN: Freq: Once | CUTANEOUS | Status: DC

## 2022-05-05 MED ORDER — MAGNESIUM OXIDE -MG SUPPLEMENT 400 (240 MG) MG PO TABS
200.0000 mg | ORAL_TABLET | Freq: Every day | ORAL | Status: DC
  Administered 2022-05-06 – 2022-05-07 (×2): 200 mg via ORAL
  Filled 2022-05-05 (×2): qty 1

## 2022-05-05 MED ORDER — POLYETHYLENE GLYCOL 3350 17 G PO PACK: 17.0000 g | PACK | Freq: Every day | ORAL | Status: DC | PRN

## 2022-05-05 MED ORDER — MENTHOL 3 MG MT LOZG: 1.0000 | LOZENGE | OROMUCOSAL | Status: DC | PRN

## 2022-05-05 MED ORDER — ONDANSETRON HCL 4 MG PO TABS: 4.0000 mg | ORAL_TABLET | Freq: Four times a day (QID) | ORAL | Status: DC | PRN

## 2022-05-05 MED ORDER — DOCUSATE SODIUM 100 MG PO CAPS
100.0000 mg | ORAL_CAPSULE | Freq: Two times a day (BID) | ORAL | Status: DC
  Administered 2022-05-05 – 2022-05-07 (×4): 100 mg via ORAL
  Filled 2022-05-05 (×4): qty 1

## 2022-05-05 MED ORDER — DEXAMETHASONE SODIUM PHOSPHATE 10 MG/ML IJ SOLN
10.0000 mg | Freq: Once | INTRAMUSCULAR | Status: AC
  Administered 2022-05-06: 10 mg via INTRAVENOUS
  Filled 2022-05-05: qty 1

## 2022-05-05 MED ORDER — POVIDONE-IODINE 10 % EX SWAB: 2.0000 | Freq: Once | CUTANEOUS | Status: DC

## 2022-05-05 MED ORDER — CEFAZOLIN SODIUM-DEXTROSE 2-4 GM/100ML-% IV SOLN
2.0000 g | INTRAVENOUS | Status: AC
  Administered 2022-05-05: 2 g via INTRAVENOUS
  Filled 2022-05-05: qty 100

## 2022-05-05 MED ORDER — ACETAMINOPHEN 500 MG PO TABS
1000.0000 mg | ORAL_TABLET | Freq: Once | ORAL | Status: AC
  Administered 2022-05-05: 1000 mg via ORAL
  Filled 2022-05-05: qty 2

## 2022-05-05 MED ORDER — METHOCARBAMOL 500 MG PO TABS
500.0000 mg | ORAL_TABLET | Freq: Four times a day (QID) | ORAL | Status: DC | PRN
  Administered 2022-05-05 – 2022-05-07 (×5): 500 mg via ORAL
  Filled 2022-05-05 (×5): qty 1

## 2022-05-05 MED ORDER — DIPHENHYDRAMINE HCL 12.5 MG/5ML PO ELIX: 12.5000 mg | ORAL_SOLUTION | ORAL | Status: DC | PRN

## 2022-05-05 MED ORDER — MORPHINE SULFATE (PF) 2 MG/ML IV SOLN
0.5000 mg | INTRAVENOUS | Status: DC | PRN
  Administered 2022-05-05: 1 mg via INTRAVENOUS
  Administered 2022-05-06: 0.5 mg via INTRAVENOUS
  Filled 2022-05-05 (×2): qty 1

## 2022-05-05 MED ORDER — POTASSIUM CHLORIDE IN NACL 20-0.9 MEQ/L-% IV SOLN
INTRAVENOUS | Status: DC
  Filled 2022-05-05 (×3): qty 1000

## 2022-05-05 MED ORDER — OXYCODONE HCL 5 MG/5ML PO SOLN: 5.0000 mg | Freq: Once | ORAL | Status: AC | PRN

## 2022-05-05 MED ORDER — SPIRONOLACTONE 25 MG PO TABS
25.0000 mg | ORAL_TABLET | Freq: Every day | ORAL | Status: DC
  Administered 2022-05-06 – 2022-05-07 (×2): 25 mg via ORAL
  Filled 2022-05-05 (×2): qty 1

## 2022-05-05 MED ORDER — AMISULPRIDE (ANTIEMETIC) 5 MG/2ML IV SOLN: 10.0000 mg | Freq: Once | INTRAVENOUS | Status: DC | PRN

## 2022-05-05 MED ORDER — CHLORHEXIDINE GLUCONATE 0.12 % MT SOLN
15.0000 mL | Freq: Once | OROMUCOSAL | Status: AC
  Administered 2022-05-05: 15 mL via OROMUCOSAL

## 2022-05-05 MED ORDER — ROPIVACAINE HCL 5 MG/ML IJ SOLN
INTRAMUSCULAR | Status: DC | PRN
  Administered 2022-05-05: 30 mL via PERINEURAL

## 2022-05-05 MED ORDER — LACTATED RINGERS IV SOLN: INTRAVENOUS | Status: DC

## 2022-05-05 MED ORDER — ACETAMINOPHEN 500 MG PO TABS
500.0000 mg | ORAL_TABLET | Freq: Four times a day (QID) | ORAL | Status: AC
  Administered 2022-05-05: 500 mg via ORAL
  Filled 2022-05-05: qty 1

## 2022-05-05 MED ORDER — HYDROCODONE-ACETAMINOPHEN 5-325 MG PO TABS: 1.0000 | ORAL_TABLET | ORAL | Status: DC | PRN

## 2022-05-05 MED ORDER — PHENOL 1.4 % MT LIQD: 1.0000 | OROMUCOSAL | Status: DC | PRN

## 2022-05-05 MED ORDER — BUPIVACAINE HCL 0.25 % IJ SOLN
INTRAMUSCULAR | Status: AC
  Filled 2022-05-05: qty 1

## 2022-05-05 MED ORDER — HYDROCODONE-ACETAMINOPHEN 7.5-325 MG PO TABS
1.0000 | ORAL_TABLET | ORAL | Status: DC | PRN
  Administered 2022-05-05 – 2022-05-06 (×3): 2 via ORAL
  Filled 2022-05-05 (×3): qty 2

## 2022-05-05 MED ORDER — TRANEXAMIC ACID-NACL 1000-0.7 MG/100ML-% IV SOLN
1000.0000 mg | Freq: Once | INTRAVENOUS | Status: AC
  Administered 2022-05-05: 1000 mg via INTRAVENOUS
  Filled 2022-05-05: qty 100

## 2022-05-05 MED ORDER — OXYCODONE HCL 5 MG PO TABS
ORAL_TABLET | ORAL | Status: AC
  Filled 2022-05-05: qty 1

## 2022-05-05 MED ORDER — MIDAZOLAM HCL 2 MG/2ML IJ SOLN
1.0000 mg | INTRAMUSCULAR | Status: DC
  Filled 2022-05-05: qty 2

## 2022-05-05 MED ORDER — EZETIMIBE 10 MG PO TABS
10.0000 mg | ORAL_TABLET | Freq: Every day | ORAL | Status: DC
  Administered 2022-05-06 – 2022-05-07 (×2): 10 mg via ORAL
  Filled 2022-05-05 (×2): qty 1

## 2022-05-05 MED ORDER — SODIUM CHLORIDE 0.9 % IR SOLN
Status: DC | PRN
  Administered 2022-05-05 (×2): 1000 mL

## 2022-05-05 MED ORDER — ACETAMINOPHEN 500 MG PO TABS: 1000.0000 mg | ORAL_TABLET | Freq: Once | ORAL | Status: DC

## 2022-05-05 MED ORDER — ONDANSETRON HCL 4 MG/2ML IJ SOLN
INTRAMUSCULAR | Status: AC
  Filled 2022-05-05: qty 2

## 2022-05-05 MED ORDER — PHENYLEPHRINE HCL-NACL 20-0.9 MG/250ML-% IV SOLN
INTRAVENOUS | Status: DC | PRN
  Administered 2022-05-05: 25 ug/min via INTRAVENOUS

## 2022-05-05 MED ORDER — ONDANSETRON HCL 4 MG/2ML IJ SOLN
INTRAMUSCULAR | Status: DC | PRN
  Administered 2022-05-05: 4 mg via INTRAVENOUS

## 2022-05-05 MED ORDER — DULOXETINE HCL 60 MG PO CPEP
60.0000 mg | ORAL_CAPSULE | Freq: Every day | ORAL | Status: DC
  Administered 2022-05-06 – 2022-05-07 (×2): 60 mg via ORAL
  Filled 2022-05-05 (×2): qty 1

## 2022-05-05 MED ORDER — FLECAINIDE ACETATE 50 MG PO TABS
50.0000 mg | ORAL_TABLET | Freq: Two times a day (BID) | ORAL | Status: DC
  Administered 2022-05-05 – 2022-05-07 (×4): 50 mg via ORAL
  Filled 2022-05-05 (×5): qty 1

## 2022-05-05 MED ORDER — BISACODYL 10 MG RE SUPP: 10.0000 mg | Freq: Every day | RECTAL | Status: DC | PRN

## 2022-05-05 MED ORDER — KETOROLAC TROMETHAMINE 30 MG/ML IJ SOLN
INTRAMUSCULAR | Status: DC | PRN
  Administered 2022-05-05: 30 mg

## 2022-05-05 MED ORDER — LOSARTAN POTASSIUM 25 MG PO TABS
25.0000 mg | ORAL_TABLET | Freq: Every day | ORAL | Status: DC
  Administered 2022-05-05 – 2022-05-06 (×2): 25 mg via ORAL
  Filled 2022-05-05 (×2): qty 1

## 2022-05-05 MED ORDER — ONDANSETRON HCL 4 MG/2ML IJ SOLN: 4.0000 mg | Freq: Once | INTRAMUSCULAR | Status: DC | PRN

## 2022-05-05 MED ORDER — PROPOFOL 1000 MG/100ML IV EMUL
INTRAVENOUS | Status: AC
  Filled 2022-05-05: qty 100

## 2022-05-05 MED ORDER — MAGNESIUM CITRATE PO SOLN: 1.0000 | Freq: Once | ORAL | Status: DC | PRN

## 2022-05-05 MED ORDER — ALUM & MAG HYDROXIDE-SIMETH 200-200-20 MG/5ML PO SUSP
30.0000 mL | ORAL | Status: DC | PRN
  Filled 2022-05-05: qty 30

## 2022-05-05 MED ORDER — BUPIVACAINE HCL 0.25 % IJ SOLN
INTRAMUSCULAR | Status: DC | PRN
  Administered 2022-05-05: 30 mL

## 2022-05-05 MED ORDER — CARVEDILOL 3.125 MG PO TABS
3.1250 mg | ORAL_TABLET | Freq: Two times a day (BID) | ORAL | Status: DC
  Administered 2022-05-05 – 2022-05-07 (×4): 3.125 mg via ORAL
  Filled 2022-05-05 (×4): qty 1

## 2022-05-05 MED ORDER — METHOCARBAMOL 500 MG IVPB - SIMPLE MED: 500.0000 mg | Freq: Four times a day (QID) | INTRAVENOUS | Status: DC | PRN

## 2022-05-05 SURGICAL SUPPLY — 52 items
ADH SKN CLS APL DERMABOND .7 (GAUZE/BANDAGES/DRESSINGS) ×2
ATTUNE MED DOME PAT 38 KNEE (Knees) IMPLANT
ATTUNE PS FEM RT SZ 6 CEM KNEE (Femur) IMPLANT
BASE TIBIAL CEM ATTUNE SZ 7 (Knees) ×1 IMPLANT
BASEPLATE TIB CEM ATTUNE SZ7 (Knees) IMPLANT
BLADE SAG 18X100X1.27 (BLADE) ×1 IMPLANT
BLADE SAW SGTL 11.0X1.19X90.0M (BLADE) IMPLANT
BLADE SAW SGTL 13X75X1.27 (BLADE) ×1 IMPLANT
BLADE SURG 15 STRL LF DISP TIS (BLADE) ×1 IMPLANT
BLADE SURG 15 STRL SS (BLADE) ×1
BNDG CMPR MED 10X6 ELC LF (GAUZE/BANDAGES/DRESSINGS) ×1
BNDG ELASTIC 6X10 VLCR STRL LF (GAUZE/BANDAGES/DRESSINGS) ×1 IMPLANT
BOWL SMART MIX CTS (DISPOSABLE) ×1 IMPLANT
BSPLAT TIB 7 CMNT FX BRNG STRL (Knees) ×1 IMPLANT
CEMENT HV SMART SET (Cement) ×2 IMPLANT
CLSR STERI-STRIP ANTIMIC 1/2X4 (GAUZE/BANDAGES/DRESSINGS) ×2 IMPLANT
COVER SURGICAL LIGHT HANDLE (MISCELLANEOUS) ×1 IMPLANT
CUFF TOURN SGL QUICK 34 (TOURNIQUET CUFF) ×1
CUFF TRNQT CYL 34X4.125X (TOURNIQUET CUFF) ×1 IMPLANT
DERMABOND ADVANCED .7 DNX12 (GAUZE/BANDAGES/DRESSINGS) IMPLANT
DRAPE SHEET LG 3/4 BI-LAMINATE (DRAPES) ×1 IMPLANT
DRAPE U-SHAPE 47X51 STRL (DRAPES) ×1 IMPLANT
DRSG MEPILEX POST OP 4X12 (GAUZE/BANDAGES/DRESSINGS) ×1 IMPLANT
DURAPREP 26ML APPLICATOR (WOUND CARE) ×2 IMPLANT
ELECT REM PT RETURN 15FT ADLT (MISCELLANEOUS) ×1 IMPLANT
GAUZE PAD ABD 8X10 STRL (GAUZE/BANDAGES/DRESSINGS) ×2 IMPLANT
GLOVE BIO SURGEON STRL SZ 6.5 (GLOVE) ×1 IMPLANT
GLOVE BIO SURGEON STRL SZ7.5 (GLOVE) ×1 IMPLANT
GLOVE BIOGEL PI IND STRL 7.0 (GLOVE) ×1 IMPLANT
GLOVE BIOGEL PI IND STRL 8 (GLOVE) ×1 IMPLANT
GOWN STRL SURGICAL XL XLNG (GOWN DISPOSABLE) ×2 IMPLANT
HANDPIECE INTERPULSE COAX TIP (DISPOSABLE) ×1
IMMOBILIZER KNEE 20 (SOFTGOODS) ×1
IMMOBILIZER KNEE 20 THIGH 36 (SOFTGOODS) ×1 IMPLANT
INSERT TIB PS FB ATTUNE SZ6X5 (Knees) IMPLANT
KIT TURNOVER KIT A (KITS) IMPLANT
MANIFOLD NEPTUNE II (INSTRUMENTS) ×1 IMPLANT
NS IRRIG 1000ML POUR BTL (IV SOLUTION) ×1 IMPLANT
PACK TOTAL KNEE CUSTOM (KITS) ×1 IMPLANT
PIN STEINMAN FIXATION KNEE (PIN) IMPLANT
PROTECTOR NERVE ULNAR (MISCELLANEOUS) ×1 IMPLANT
SET HNDPC FAN SPRY TIP SCT (DISPOSABLE) ×1 IMPLANT
SET PAD KNEE POSITIONER (MISCELLANEOUS) ×1 IMPLANT
SUT VIC AB 1 CT1 36 (SUTURE) ×2 IMPLANT
SUT VIC AB 2-0 CT1 27 (SUTURE) ×1
SUT VIC AB 2-0 CT1 TAPERPNT 27 (SUTURE) ×1 IMPLANT
SUT VIC AB 3-0 SH 27 (SUTURE)
SUT VIC AB 3-0 SH 27X BRD (SUTURE) IMPLANT
TRAY FOLEY MTR SLVR 16FR STAT (SET/KITS/TRAYS/PACK) ×1 IMPLANT
TUBE SUCTION HIGH CAP CLEAR NV (SUCTIONS) ×1 IMPLANT
WATER STERILE IRR 1000ML POUR (IV SOLUTION) ×2 IMPLANT
WRAP KNEE MAXI GEL POST OP (GAUZE/BANDAGES/DRESSINGS) ×1 IMPLANT

## 2022-05-05 NOTE — Interval H&P Note (Signed)
History and Physical Interval Note:  05/05/2022 9:41 AM  Heather Smith  has presented today for surgery, with the diagnosis of DJD RIGHT KNEE.  The various methods of treatment have been discussed with the patient and family. After consideration of risks, benefits and other options for treatment, the patient has consented to  Procedure(s): TOTAL KNEE ARTHROPLASTY (Right) as a surgical intervention.  The patient's history has been reviewed, patient examined, no change in status, stable for surgery.  I have reviewed the patient's chart and labs.  Questions were answered to the patient's satisfaction.     Johnny Bridge

## 2022-05-05 NOTE — Anesthesia Procedure Notes (Signed)
Procedure Name: MAC Date/Time: 05/05/2022 10:48 AM  Performed by: Maxwell Caul, CRNAPre-anesthesia Checklist: Patient identified, Emergency Drugs available, Suction available and Patient being monitored Oxygen Delivery Method: Simple face mask

## 2022-05-05 NOTE — Discharge Instructions (Signed)
INSTRUCTIONS AFTER JOINT REPLACEMENT  ° °Remove items at home which could result in a fall. This includes throw rugs or furniture in walking pathways °ICE to the affected joint every three hours while awake for 30 minutes at a time, for at least the first 3-5 days, and then as needed for pain and swelling.  Continue to use ice for pain and swelling. You may notice swelling that will progress down to the foot and ankle.  This is normal after surgery.  Elevate your leg when you are not up walking on it.   °Continue to use the breathing machine you got in the hospital (incentive spirometer) which will help keep your temperature down.  It is common for your temperature to cycle up and down following surgery, especially at night when you are not up moving around and exerting yourself.  The breathing machine keeps your lungs expanded and your temperature down. ° ° °DIET:  As you were doing prior to hospitalization, we recommend a well-balanced diet. ° °DRESSING / WOUND CARE / SHOWERING ° °You may shower 3 days after surgery, but keep the wounds dry during showering.  You may use an occlusive plastic wrap (Press'n Seal for example), NO SOAKING/SUBMERGING IN THE BATHTUB.  If the bandage gets wet, change with a clean dry gauze.  If the incision gets wet, pat the wound dry with a clean towel. ° °ACTIVITY ° °Increase activity slowly as tolerated, but follow the weight bearing instructions below.   °No driving for 6 weeks or until further direction given by your physician.  You cannot drive while taking narcotics.  °No lifting or carrying greater than 10 lbs. until further directed by your surgeon. °Avoid periods of inactivity such as sitting longer than an hour when not asleep. This helps prevent blood clots.  °You may return to work once you are authorized by your doctor.  ° ° ° °WEIGHT BEARING  ° °Weight bearing as tolerated with assist device (walker, cane, etc) as directed, use it as long as suggested by your surgeon or  therapist, typically at least 4-6 weeks. ° ° °EXERCISES ° °Results after joint replacement surgery are often greatly improved when you follow the exercise, range of motion and muscle strengthening exercises prescribed by your doctor. Safety measures are also important to protect the joint from further injury. Any time any of these exercises cause you to have increased pain or swelling, decrease what you are doing until you are comfortable again and then slowly increase them. If you have problems or questions, call your caregiver or physical therapist for advice.  ° °Rehabilitation is important following a joint replacement. After just a few days of immobilization, the muscles of the leg can become weakened and shrink (atrophy).  These exercises are designed to build up the tone and strength of the thigh and leg muscles and to improve motion. Often times heat used for twenty to thirty minutes before working out will loosen up your tissues and help with improving the range of motion but do not use heat for the first two weeks following surgery (sometimes heat can increase post-operative swelling).  ° °These exercises can be done on a training (exercise) mat, on the floor, on a table or on a bed. Use whatever works the best and is most comfortable for you.    Use music or television while you are exercising so that the exercises are a pleasant break in your day. This will make your life better with the exercises acting   as a break in your routine that you can look forward to.   Perform all exercises about fifteen times, three times per day or as directed.  You should exercise both the operative leg and the other leg as well. ° °Exercises include: °  °Quad Sets - Tighten up the muscle on the front of the thigh (Quad) and hold for 5-10 seconds.   °Straight Leg Raises - With your knee straight (if you were given a brace, keep it on), lift the leg to 60 degrees, hold for 3 seconds, and slowly lower the leg.  Perform this  exercise against resistance later as your leg gets stronger.  °Leg Slides: Lying on your back, slowly slide your foot toward your buttocks, bending your knee up off the floor (only go as far as is comfortable). Then slowly slide your foot back down until your leg is flat on the floor again.  °Angel Wings: Lying on your back spread your legs to the side as far apart as you can without causing discomfort.  °Hamstring Strength:  Lying on your back, push your heel against the floor with your leg straight by tightening up the muscles of your buttocks.  Repeat, but this time bend your knee to a comfortable angle, and push your heel against the floor.  You may put a pillow under the heel to make it more comfortable if necessary.  ° °A rehabilitation program following joint replacement surgery can speed recovery and prevent re-injury in the future due to weakened muscles. Contact your doctor or a physical therapist for more information on knee rehabilitation.  ° ° °CONSTIPATION ° °Constipation is defined medically as fewer than three stools per week and severe constipation as less than one stool per week.  Even if you have a regular bowel pattern at home, your normal regimen is likely to be disrupted due to multiple reasons following surgery.  Combination of anesthesia, postoperative narcotics, change in appetite and fluid intake all can affect your bowels.  ° °YOU MUST use at least one of the following options; they are listed in order of increasing strength to get the job done.  They are all available over the counter, and you may need to use some, POSSIBLY even all of these options:   ° °Drink plenty of fluids (prune juice may be helpful) and high fiber foods °Colace 100 mg by mouth twice a day  °Senokot for constipation as directed and as needed Dulcolax (bisacodyl), take with full glass of water  °Miralax (polyethylene glycol) once or twice a day as needed. ° °If you have tried all these things and are unable to have a  bowel movement in the first 3-4 days after surgery call either your surgeon or your primary doctor.   ° °If you experience loose stools or diarrhea, hold the medications until you stool forms back up.  If your symptoms do not get better within 1 week or if they get worse, check with your doctor.  If you experience "the worst abdominal pain ever" or develop nausea or vomiting, please contact the office immediately for further recommendations for treatment. ° ° °ITCHING:  If you experience itching with your medications, try taking only a single pain pill, or even half a pain pill at a time.  You can also use Benadryl over the counter for itching or also to help with sleep.  ° °TED HOSE STOCKINGS:  Use stockings on both legs until for at least 2 weeks or as directed by   physician office. They may be removed at night for sleeping. ° °MEDICATIONS:  See your medication summary on the “After Visit Summary” that nursing will review with you.  You may have some home medications which will be placed on hold until you complete the course of blood thinner medication.  It is important for you to complete the blood thinner medication as prescribed. ° °PRECAUTIONS:  If you experience chest pain or shortness of breath - call 911 immediately for transfer to the hospital emergency department.  ° °If you develop a fever greater that 101 F, purulent drainage from wound, increased redness or drainage from wound, foul odor from the wound/dressing, or calf pain - CONTACT YOUR SURGEON.   °                                                °FOLLOW-UP APPOINTMENTS:  If you do not already have a post-op appointment, please call the office for an appointment to be seen by your surgeon.  Guidelines for how soon to be seen are listed in your “After Visit Summary”, but are typically between 1-4 weeks after surgery. ° °OTHER INSTRUCTIONS:  ° °Knee Replacement:  Do not place pillow under knee, focus on keeping the knee straight while resting.   ° ° °POST-OPERATIVE OPIOID TAPER INSTRUCTIONS: °It is important to wean off of your opioid medication as soon as possible. If you do not need pain medication after your surgery it is ok to stop day one. °Opioids include: °Codeine, Hydrocodone(Norco, Vicodin), Oxycodone(Percocet, oxycontin) and hydromorphone amongst others.  °Long term and even short term use of opiods can cause: °Increased pain response °Dependence °Constipation °Depression °Respiratory depression °And more.  °Withdrawal symptoms can include °Flu like symptoms °Nausea, vomiting °And more °Techniques to manage these symptoms °Hydrate well °Eat regular healthy meals °Stay active °Use relaxation techniques(deep breathing, meditating, yoga) °Do Not substitute Alcohol to help with tapering °If you have been on opioids for less than two weeks and do not have pain than it is ok to stop all together.  °Plan to wean off of opioids °This plan should start within one week post op of your joint replacement. °Maintain the same interval or time between taking each dose and first decrease the dose.  °Cut the total daily intake of opioids by one tablet each day °Next start to increase the time between doses. °The last dose that should be eliminated is the evening dose.  ° °MAKE SURE YOU:  °Understand these instructions.  °Get help right away if you are not doing well or get worse.  ° ° °Thank you for letting us be a part of your medical care team.  It is a privilege we respect greatly.  We hope these instructions will help you stay on track for a fast and full recovery!  ° ° °  °

## 2022-05-05 NOTE — Anesthesia Procedure Notes (Signed)
Anesthesia Regional Block: Adductor canal block   Pre-Anesthetic Checklist: , timeout performed,  Correct Patient, Correct Site, Correct Laterality,  Correct Procedure, Correct Position, site marked,  Risks and benefits discussed,  Surgical consent,  Pre-op evaluation,  At surgeon's request and post-op pain management  Laterality: Right  Prep: Maximum Sterile Barrier Precautions used, chloraprep       Needles:  Injection technique: Single-shot  Needle Type: Echogenic Stimulator Needle     Needle Length: 9cm  Needle Gauge: 22     Additional Needles:   Procedures:,,,, ultrasound used (permanent image in chart),,    Narrative:  Start time: 05/05/2022 9:20 AM End time: 05/05/2022 9:30 AM Injection made incrementally with aspirations every 5 mL.  Performed by: Personally  Anesthesiologist: Pervis Hocking, DO  Additional Notes: Monitors applied. No increased pain on injection. No increased resistance to injection. Injection made in 5cc increments. Good needle visualization. Patient tolerated procedure well.

## 2022-05-05 NOTE — Anesthesia Procedure Notes (Signed)
Spinal  Patient location during procedure: OR End time: 05/05/2022 10:55 AM Reason for block: surgical anesthesia Staffing Performed: resident/CRNA  Anesthesiologist: Pervis Hocking, DO Resident/CRNA: Maxwell Caul, CRNA Performed by: Maxwell Caul, CRNA Authorized by: Pervis Hocking, DO   Preanesthetic Checklist Completed: patient identified, IV checked, site marked, risks and benefits discussed, surgical consent, monitors and equipment checked, pre-op evaluation and timeout performed Spinal Block Patient position: sitting Prep: DuraPrep Patient monitoring: heart rate, cardiac monitor, continuous pulse ox and blood pressure Approach: midline Location: L3-4 Injection technique: single-shot Needle Needle type: Pencan  Needle gauge: 24 G Needle length: 10 cm Assessment Sensory level: T4 Events: CSF return Additional Notes IV functioning, monitors applied to pt. Expiration date of kit checked and confirmed to be in date. Sterile prep and drape, hand hygiene and sterile gloves used. Pt was positioned and spine was prepped in sterile fashion. Skin was anesthetized with lidocaine. Free flow of clear CSF obtained prior to injecting local anesthetic into CSF. Spinal needle aspirated freely following injection. Needle was carefully withdrawn, and pt tolerated procedure well. Loss of motor and sensory on exam post injection. Dr Elgie Congo at bedside during entire placement.

## 2022-05-05 NOTE — Op Note (Signed)
DATE OF SURGERY:  05/05/2022 TIME: 12:38 PM  PATIENT NAME:  Heather Smith   AGE: 89 y.o.    PRE-OPERATIVE DIAGNOSIS: Right knee primary localized osteoarthritis  POST-OPERATIVE DIAGNOSIS:  Same  PROCEDURE: RIGHT total Knee Arthroplasty  SURGEON:  Johnny Bridge, MD   ASSISTANT:  Merlene Pulling, PA-C, present and scrubbed throughout the case, critical for assistance with exposure, retraction, instrumentation, and closure.  Implant Name Type Inv. Item Serial No. Manufacturer Lot No. LRB No. Used Action  CEMENT HV SMART SET - [id number] Cement CEMENT HV SMART SET  [employer] [id number] Right 2 Implanted  BASE TIBIAL CEM ATTUNE SZ 7 - [id number] Knees BASE TIBIAL CEM ATTUNE SZ 7  [employer] [id number] Right 1 Implanted  ATTUNE PS FEM RT SZ 6 CEM KNEE - [id number] Femur ATTUNE PS FEM RT SZ 6 CEM KNEE  [employer] [id number] Right 1 Implanted  ATTUNE MED DOME PAT 38 KNEE - [id number] Knees ATTUNE MED DOME PAT 38 KNEE  [employer] [id number] Right 1 Implanted  INSERT TIB PS FB ATTUNE SZ6X5 - [id number] Knees INSERT TIB PS FB ATTUNE SZ6X5  [employer] [id number] Right 1 Implanted     PREOPERATIVE INDICATIONS:  Heather Smith is a 89 y.o. year old female with end stage bone on bone degenerative arthritis of the knee who failed conservative treatment, including injections, antiinflammatories, activity modification, and assistive devices, and had significant impairment of their activities of daily living, and elected for Total Knee Arthroplasty.   The risks, benefits, and alternatives were discussed at length including but not limited to the risks of infection, bleeding, nerve injury, stiffness, blood clots, the need for revision surgery, cardiopulmonary complications, among others, and they were willing to proceed.  OPERATIVE FINDINGS AND UNIQUE ASPECTS OF THE CASE: She had advanced changes with eburnation medially, with a lot of grooving, and destruction laterally  as well.  Bone quality was mediocre.  She had a 15 degree preoperative flexion contracture, and I was able to achieve extension by the completion of the case.  The femoral canal was fairly tight, the intramedullary guide did not want to seat easily, but gently I was able to advance it.  ESTIMATED BLOOD LOSS: 150 mL  OPERATIVE DESCRIPTION:  The patient was brought to the operative room and placed in a supine position.  Anesthesia was administered.  IV antibiotics were given.  The lower extremity was prepped and draped in the usual sterile fashion.  Time out was performed.  The leg was elevated and exsanguinated and the tourniquet was inflated.  Anterior quadriceps tendon splitting approach was performed.  The patella was everted and osteophytes were removed.  The anterior horn of the medial and lateral meniscus was removed.   The patella was then measured, and cut with the saw.  The thickness before the cut was 22 and after the cut was 14.  A metal shield was used to protect the patella throughout the case.    The distal femur was opened with the drill and the intramedullary distal femoral cutting jig was utilized, set at 5 degrees resecting 9 mm off the distal femur.  Care was taken to protect the collateral ligaments.  Then the extramedullary tibial cutting jig was utilized making the appropriate cut using the anterior tibial crest as a reference building in appropriate posterior slope.  Care was taken during the cut to protect the medial and collateral ligaments.  The proximal tibia was removed along with the posterior horns of the menisci.  The PCL was sacrificed.    The extensor gap was measured and found to have adequate resection, measuring to a size 5.    The distal femoral sizing jig was applied, taking care to avoid notching.  This was set at 3 degrees of external rotation.  Then the 4-in-1 cutting jig was applied and the anterior and posterior femur was cut, along with the chamfer cuts.   All posterior osteophytes were removed.  The flexion gap was then measured and was symmetric with the extension gap.  I completed the distal femoral preparation using the appropriate jig to prepare the box.  The proximal tibia sized and prepared accordingly with the reamer and the punch, and then all components were trialed with the poly insert.  The knee was found to have excellent balance and full motion.    The above named components were then cemented into place and all excess cement was removed.  The real polyethylene implant was placed.  After the cement had cured I released the tourniquet and confirmed excellent hemostasis with no major posterior vessel injury.    The knee was easily taken through a range of motion and the patella tracked well and the knee irrigated copiously and the parapatellar and subcutaneous tissue closed with vicryl, and monocryl with steri strips for the skin.  The wounds were injected with marcaine, and dressed with sterile gauze and the patient was awakened and returned to the PACU in stable and satisfactory condition.  There were no complications.  Total tourniquet time was approximately 70 minutes.

## 2022-05-05 NOTE — Transfer of Care (Signed)
Immediate Anesthesia Transfer of Care Note  Patient: Heather Smith  Procedure(s) Performed: TOTAL KNEE ARTHROPLASTY (Right: Knee)  Patient Location: PACU  Anesthesia Type:Spinal  Level of Consciousness: awake, alert , and oriented  Airway & Oxygen Therapy: Patient Spontanous Breathing and Patient connected to face mask oxygen  Post-op Assessment: Report given to RN and Post -op Vital signs reviewed and stable  Post vital signs: Reviewed and stable  Last Vitals:  Vitals Value Taken Time  BP 121/59 05/05/22 1313  Temp    Pulse 51 05/05/22 1316  Resp 17 05/05/22 1316  SpO2 84 % 05/05/22 1316  Vitals shown include unvalidated device data.  Last Pain:  Vitals:   05/05/22 0904  TempSrc:   PainSc: 0-No pain         Complications: No notable events documented.

## 2022-05-06 ENCOUNTER — Encounter (HOSPITAL_COMMUNITY): Payer: Self-pay | Admitting: Orthopedic Surgery

## 2022-05-06 DIAGNOSIS — I5022 Chronic systolic (congestive) heart failure: Secondary | ICD-10-CM | POA: Diagnosis not present

## 2022-05-06 DIAGNOSIS — Z96652 Presence of left artificial knee joint: Secondary | ICD-10-CM | POA: Diagnosis not present

## 2022-05-06 DIAGNOSIS — Z950 Presence of cardiac pacemaker: Secondary | ICD-10-CM | POA: Diagnosis not present

## 2022-05-06 DIAGNOSIS — E039 Hypothyroidism, unspecified: Secondary | ICD-10-CM | POA: Diagnosis not present

## 2022-05-06 DIAGNOSIS — M1711 Unilateral primary osteoarthritis, right knee: Secondary | ICD-10-CM | POA: Diagnosis not present

## 2022-05-06 DIAGNOSIS — I130 Hypertensive heart and chronic kidney disease with heart failure and stage 1 through stage 4 chronic kidney disease, or unspecified chronic kidney disease: Secondary | ICD-10-CM | POA: Diagnosis not present

## 2022-05-06 DIAGNOSIS — Z85828 Personal history of other malignant neoplasm of skin: Secondary | ICD-10-CM | POA: Diagnosis not present

## 2022-05-06 DIAGNOSIS — Z79899 Other long term (current) drug therapy: Secondary | ICD-10-CM | POA: Diagnosis not present

## 2022-05-06 DIAGNOSIS — N1831 Chronic kidney disease, stage 3a: Secondary | ICD-10-CM | POA: Diagnosis not present

## 2022-05-06 LAB — CBC
HCT: 32.5 % — ABNORMAL LOW (ref 36.0–46.0)
Hemoglobin: 10.7 g/dL — ABNORMAL LOW (ref 12.0–15.0)
MCH: 31.5 pg (ref 26.0–34.0)
MCHC: 32.9 g/dL (ref 30.0–36.0)
MCV: 95.6 fL (ref 80.0–100.0)
Platelets: 134 10*3/uL — ABNORMAL LOW (ref 150–400)
RBC: 3.4 MIL/uL — ABNORMAL LOW (ref 3.87–5.11)
RDW: 13.2 % (ref 11.5–15.5)
WBC: 11.1 10*3/uL — ABNORMAL HIGH (ref 4.0–10.5)
nRBC: 0 % (ref 0.0–0.2)

## 2022-05-06 LAB — BASIC METABOLIC PANEL
Anion gap: 5 (ref 5–15)
BUN: 26 mg/dL — ABNORMAL HIGH (ref 8–23)
CO2: 24 mmol/L (ref 22–32)
Calcium: 7.5 mg/dL — ABNORMAL LOW (ref 8.9–10.3)
Chloride: 110 mmol/L (ref 98–111)
Creatinine, Ser: 1.12 mg/dL — ABNORMAL HIGH (ref 0.44–1.00)
GFR, Estimated: 47 mL/min — ABNORMAL LOW (ref >60)
Glucose, Bld: 132 mg/dL — ABNORMAL HIGH (ref 70–99)
Potassium: 6.9 mmol/L (ref 3.5–5.1)
Sodium: 139 mmol/L (ref 135–145)

## 2022-05-06 LAB — GLUCOSE, CAPILLARY: Glucose-Capillary: 193 mg/dL — ABNORMAL HIGH (ref 70–99)

## 2022-05-06 LAB — POTASSIUM
Potassium: 4.6 mmol/L (ref 3.5–5.1)
Potassium: 4.3 mmol/L (ref 3.5–5.1)
Potassium: 4.4 mmol/L (ref 3.5–5.1)

## 2022-05-06 MED ORDER — CALCIUM GLUCONATE-NACL 1-0.675 GM/50ML-% IV SOLN
1.0000 g | Freq: Once | INTRAVENOUS | Status: DC
  Administered 2022-05-06: 1000 mg via INTRAVENOUS
  Filled 2022-05-06: qty 50

## 2022-05-06 MED ORDER — TRAMADOL HCL 50 MG PO TABS
50.0000 mg | ORAL_TABLET | Freq: Four times a day (QID) | ORAL | Status: DC | PRN
  Administered 2022-05-07 (×2): 50 mg via ORAL
  Filled 2022-05-06 (×3): qty 1

## 2022-05-06 MED ORDER — OXYCODONE HCL 5 MG PO TABS
5.0000 mg | ORAL_TABLET | ORAL | Status: DC | PRN
  Administered 2022-05-06 – 2022-05-07 (×2): 5 mg via ORAL
  Filled 2022-05-06 (×2): qty 1

## 2022-05-06 MED ORDER — INSULIN ASPART 100 UNIT/ML IV SOLN
5.0000 [IU] | Freq: Once | INTRAVENOUS | Status: AC
  Administered 2022-05-06: 5 [IU] via INTRAVENOUS

## 2022-05-06 MED ORDER — ACETAMINOPHEN 500 MG PO TABS
1000.0000 mg | ORAL_TABLET | Freq: Four times a day (QID) | ORAL | Status: DC
  Administered 2022-05-07 (×3): 1000 mg via ORAL
  Filled 2022-05-06 (×3): qty 2

## 2022-05-06 MED ORDER — LIDOCAINE 4 % EX CREA
TOPICAL_CREAM | Freq: Every day | CUTANEOUS | Status: DC | PRN
  Administered 2022-05-06: 1 via TOPICAL
  Filled 2022-05-06: qty 5

## 2022-05-06 MED ORDER — LIDOCAINE 4 % EX CREA
TOPICAL_CREAM | Freq: Every day | CUTANEOUS | Status: DC | PRN
  Filled 2022-05-06: qty 5

## 2022-05-06 MED ORDER — FUROSEMIDE 10 MG/ML IJ SOLN
40.0000 mg | Freq: Once | INTRAMUSCULAR | Status: DC
  Filled 2022-05-06: qty 4

## 2022-05-06 MED ORDER — HYDROCODONE-ACETAMINOPHEN 7.5-325 MG PO TABS
1.0000 | ORAL_TABLET | ORAL | Status: DC | PRN
  Administered 2022-05-06: 2 via ORAL
  Filled 2022-05-06: qty 2

## 2022-05-06 MED ORDER — ASPIRIN 325 MG PO TBEC
325.0000 mg | DELAYED_RELEASE_TABLET | Freq: Two times a day (BID) | ORAL | Status: DC
  Administered 2022-05-06 – 2022-05-07 (×2): 325 mg via ORAL
  Filled 2022-05-06 (×2): qty 1

## 2022-05-06 MED ORDER — DEXTROSE 50 % IV SOLN
1.0000 | Freq: Once | INTRAVENOUS | Status: AC
  Administered 2022-05-06: 50 mL via INTRAVENOUS
  Filled 2022-05-06: qty 50

## 2022-05-06 NOTE — Anesthesia Postprocedure Evaluation (Signed)
Anesthesia Post Note  Patient: Heather Smith  Procedure(s) Performed: TOTAL KNEE ARTHROPLASTY (Right: Knee)     Patient location during evaluation: PACU Anesthesia Type: Regional, MAC and Spinal Level of consciousness: awake and alert and oriented Pain management: pain level controlled Vital Signs Assessment: post-procedure vital signs reviewed and stable Respiratory status: spontaneous breathing, nonlabored ventilation and respiratory function stable Cardiovascular status: blood pressure returned to baseline and stable Postop Assessment: no headache, no backache, spinal receding and patient able to bend at knees Anesthetic complications: no   No notable events documented.  Last Vitals:  Vitals:   05/06/22 0449 05/06/22 0721  BP: 118/62 124/61  Pulse: 73 75  Resp: 16   Temp: 36.8 C 36.7 C  SpO2: 98% 100%    Last Pain:  Vitals:   05/06/22 0721  TempSrc: Oral  PainSc: [place]

## 2022-05-06 NOTE — TOC Transition Note (Signed)
Transition of Care [facility]) - CM/SW Discharge Note   Patient Details  Name: DANELLA AGRICOLA MRN: [id number] Date of Birth: October 23, 1932  [facility] CM/SW Contact:  Phyllis Ginger, RN Phone Number: 05/06/2022, 10:06 AM   Clinical Narrative:   Met with pt in room, confirmed HHPT prearranged with [facility]. Pt confirmed has needed equipment at home. No further TOC needs.    Final next level of care: [place] Barriers to Discharge: No Barriers Identified   Patient Goals and CMS Choice      Discharge Placement                         Discharge Plan and Services Additional resources added to the After Visit Summary for                  DME Arranged: N/A         HH Arranged: PT HH Agency: [place]        Social Determinants of Health (SDOH) Interventions SDOH Screenings   Food Insecurity: No Food Insecurity (05/05/2022)  Housing: Low Risk  (05/05/2022)  Transportation Needs: No Transportation Needs (05/05/2022)  Utilities: Not At Risk (05/05/2022)  Financial Resource Strain: Low Risk  (11/22/2017)  Tobacco Use: Low Risk  (05/05/2022)     Readmission Risk Interventions     No data to display

## 2022-05-06 NOTE — Progress Notes (Signed)
Date and time results received: 05/06/22 04:50am   Test: K+ Critical Value: 6.9  Name of Provider Notified: Chrys Racer PA-C  Orders Received? Or Actions Taken?: Orders Received - See Orders for details

## 2022-05-06 NOTE — Progress Notes (Signed)
Physical Therapy Treatment Patient Details Name: Heather Smith MRN: [id number] DOB: 09/08/1932 Today's Date: 05/06/2022   History of Present Illness Pt is 89 yo female s/p R TKA on 05/06/22.  Pt with hx including but not limited to arthritis, CHF, depression, GERD, HCL, HTN, spinal stenosis, neuropathy, L TKA 2017    PT Comments    Pt has good quad activation and ability to move R LE, but is limited some by pain, confusion, and safety.  She has been hallucinating (daughter reports last night and now).  She had some improvement in pain but had IV pain meds at start of session.  Pt requiring min A for transfers and to ambulate 60' but required repeated multimodal cues for safety, use of RW, and sequencing.  Pt has excellent flexion and quad activation, but does have difficulty with terminal knee ext - educated on quad sets and resting with leg straight.  Daughter present for session.  Pt will benefit from further therapy before being able to safely return home from PT perspective.   Recommendations for follow up therapy are one component of a multi-disciplinary discharge planning process, led by the attending physician.  Recommendations may be updated based on patient status, additional functional criteria and insurance authorization.         Assistance Recommended at Discharge Frequent or constant Supervision/Assistance  Patient can return home with the following A lot of help with walking and/or transfers;A lot of help with bathing/dressing/bathroom;Assistance with cooking/housework;Help with stairs or ramp for entrance   Equipment Recommendations  None recommended by PT    Recommendations for Other Services       Precautions / Restrictions Precautions Precautions: Fall;Knee Restrictions Weight Bearing Restrictions: Yes RLE Weight Bearing: Weight bearing as tolerated     Mobility  Bed Mobility Overal bed mobility: Needs Assistance Bed Mobility: Sit to Supine, Rolling      Supine to sit: Supervision Sit to supine: Supervision   General bed mobility comments: supervision for lines    Transfers Overall transfer level: Needs assistance Equipment used: Rolling walker (2 wheels) Transfers: Sit to/from Stand Sit to Stand: Min assist           General transfer comment: Cues for hand placement and R LE management; performed x 2; family educated on guarding technique    Ambulation/Gait Ambulation/Gait assistance: Min Web designer (Feet): 60 Feet Assistive device: Rolling walker (2 wheels) Gait Pattern/deviations: Step-to pattern, Decreased stride length, Decreased weight shift to right, Antalgic Gait velocity: decreased     General Gait Details: Reporting less pain and able to increase step length some this afternoon (did have IV pain meds just prior to session).  No buckling but still required min A for balance and RW management.  Frequent multimodal cues for RW proximity   Stairs Stairs:  (held today due to confusion and pain)           Wheelchair Mobility    Modified Rankin (Stroke Patients Only)       Balance Overall balance assessment: Needs assistance Sitting-balance support: No upper extremity supported Sitting balance-Leahy Scale: Good     Standing balance support: Bilateral upper extremity supported Standing balance-Leahy Scale: Poor Standing balance comment: Requring RW and min A                            Cognition Arousal/Alertness: Awake/alert Behavior During Therapy: WFL for tasks assessed/performed Overall Cognitive Status: Impaired/Different from baseline Area of Impairment:  Problem solving, Attention, Following commands                       Following Commands: Follows one step commands consistently, Follows multi-step commands inconsistently     Problem Solving: Requires verbal cues, Requires tactile cues, Decreased initiation General Comments: Pt HOH required repetition at times  with multimodal cues.  Additionally, pt's daughter reports she had no sleep last night and was hallucinating and seems to be again.  Pt reports seeing wasp in air vent and despite multiple reassurance there is not, keeps asking staff to look. RN aware.  After session pt returned to bed to sleep        Exercises Total Joint Exercises Ankle Circles/Pumps: AROM, Both, 10 reps, Supine Quad Sets: AROM, Both, 10 reps, Supine Heel Slides: AAROM, Right, 5 reps, Supine Hip ABduction/ADduction: AAROM, Right, 5 reps, Supine Long Arc Quad: AROM, Right, Seated, 10 reps Knee Flexion: AROM, Right, Seated, 10 reps Goniometric ROM: R knee 10 to 80 degrees Other Exercises Other Exercises: Family educated on providing cues and positioning for exercise    General Comments        Pertinent Vitals/Pain Pain Assessment Pain Assessment: 0-10 Pain Score: 4  Pain Location: R knee Pain Descriptors / Indicators: Discomfort Pain Intervention(s): Limited activity within patient's tolerance, Monitored during session, Premedicated before session, Repositioned, Ice applied (Pt had IV morphine at start of session)    Home Living                          Prior Function            PT Goals (current goals can now be found in the care plan section) Acute Rehab PT Goals Patient Stated Goal: return home PT Goal Formulation: With patient/family Time For Goal Achievement: 05/20/22 Potential to Achieve Goals: Good Progress towards PT goals: Progressing toward goals    Frequency    7X/week      PT Plan Current plan remains appropriate    Co-evaluation              AM-PAC PT "6 Clicks" Mobility   Outcome Measure  Help needed turning from your back to your side while in a flat bed without using bedrails?: A Little Help needed moving from lying on your back to sitting on the side of a flat bed without using bedrails?: A Little Help needed moving to and from a bed to a chair (including a  wheelchair)?: A Little Help needed standing up from a chair using your arms (e.g., wheelchair or bedside chair)?: A Little Help needed to walk in hospital room?: A Lot (chair follow; mod cues) Help needed climbing 3-5 steps with a railing? : A Lot 6 Click Score: 16    End of Session Equipment Utilized During Treatment: Gait belt Activity Tolerance: Other (comment) (Tolerated well for POD #1 but does have confusion and some pain that limits.) Patient left: in bed;with bed alarm set;with call bell/phone within reach;with SCD's reapplied (Pt wanting to sleep and sleeps on L.  Positioned L sidelying with pillows between knees to support.) Nurse Communication: Mobility status PT Visit Diagnosis: Other abnormalities of gait and mobility (R26.89);Muscle weakness (generalized) (M62.81)     Time: [id number] PT Time Calculation (min) (ACUTE ONLY): 34 min  Charges:  $Gait Training: 8-22 mins $Therapeutic Exercise: 8-22 mins  Abran Richard, PT Acute Rehab [facility] [phone]    Karlton Lemon 05/06/2022, 3:58 PM

## 2022-05-06 NOTE — Evaluation (Signed)
Physical Therapy Evaluation Patient Details Name: Heather Smith MRN: [id number] DOB: 1932-11-01 Today's Date: 05/06/2022  History of Present Illness  Pt is 89 yo female s/p R TKA on 05/06/22.  Pt with hx including but not limited to arthritis, CHF, depression, GERD, HCL, HTN, spinal stenosis, neuropathy, L TKA 2017  Clinical Impression  Pt is s/p TKA resulting in the deficits listed below (see PT Problem List). At baseline, pt is independent and lives alone.  She will have help from her children (who are present) at discharge.  Today, pt with excellent quad activation and tolerance for exercise/bed mobility but had significantly increased pain with weight bearing.  She ambulated 46' but required mod multimodal cues for sequencing and small steps.  She overall required min A for walking but had 1 LOB from buckling. requiring mod A to recover, does report R LE would buckle at baseline.   Pt will benefit from acute skilled PT to increase their independence and safety with mobility to allow discharge.         Recommendations for follow up therapy are one component of a multi-disciplinary discharge planning process, led by the attending physician.  Recommendations may be updated based on patient status, additional functional criteria and insurance authorization.  Follow Up Recommendations       Assistance Recommended at Discharge Frequent or constant Supervision/Assistance  Patient can return home with the following  A lot of help with walking and/or transfers;A lot of help with bathing/dressing/bathroom;Assistance with cooking/housework;Help with stairs or ramp for entrance    Equipment Recommendations None recommended by PT  Recommendations for Other Services       Functional Status Assessment Patient has had a recent decline in their functional status and demonstrates the ability to make significant improvements in function in a reasonable and predictable amount of time.     Precautions /  Restrictions Precautions Precautions: Fall;Knee Restrictions Weight Bearing Restrictions: Yes RLE Weight Bearing: Weight bearing as tolerated      Mobility  Bed Mobility Overal bed mobility: Needs Assistance Bed Mobility: Supine to Sit     Supine to sit: Supervision, HOB elevated          Transfers Overall transfer level: Needs assistance Equipment used: Rolling walker (2 wheels) Transfers: Sit to/from Stand Sit to Stand: Min assist           General transfer comment: Cues for hand placement and R LE management; performed x 3; family educated on guarding technique    Ambulation/Gait Ambulation/Gait assistance: Min assist, Mod assist Gait Distance (Feet): 18 Feet Assistive device: Rolling walker (2 wheels) Gait Pattern/deviations: Step-to pattern, Decreased stride length, Decreased weight shift to right, Antalgic Gait velocity: decreased     General Gait Details: Pt had increased pain with weight bearing.  Requiring mod multimodal cues for sequencing and small steps and getting R foot flat. Pt initially taking very large steps and had difficulty correcting (potentially due to peripheral neuropathy, pt reports severe).  Required visual and tactile cues to shorten step length.  Some improvement in pain and stability with short steps; however, pt did still have an episode of R LE buckling requiring mod A for support.  Min physical assist otherwise. Does report she had some buckling R LE at baseline  Stairs            Wheelchair Mobility    Modified Rankin (Stroke Patients Only)       Balance Overall balance assessment: Needs assistance Sitting-balance support: No upper extremity  supported Sitting balance-Leahy Scale: Good     Standing balance support: Bilateral upper extremity supported Standing balance-Leahy Scale: Poor Standing balance comment: Requring RW and min A                             Pertinent Vitals/Pain Pain Assessment Pain  Assessment: 0-10 Pain Score: 8  Pain Location: R knee with weight bearing Pain Descriptors / Indicators: Discomfort, Grimacing Pain Intervention(s): Limited activity within patient's tolerance, Monitored during session, Premedicated before session, Repositioned, Ice applied    Home Living Family/patient expects to be discharged to:: Private residence Living Arrangements: Alone Available Help at Discharge: Family;Available 24 hours/day Type of Home: House Home Access: Stairs to enter Entrance Stairs-Rails: Right;Left;Can reach both Entrance Stairs-Number of Steps: 5   Home Layout: One level Home Equipment: BSC/3in1;Rolling Walker (2 wheels);Cane - single point      Prior Function Prior Level of Function : Independent/Modified Independent;Driving             Mobility Comments: could ambulate in community; used cane past 2 months b/c of knee pain ADLs Comments: independent with adls and iadls     Hand Dominance        Extremity/Trunk Assessment   Upper Extremity Assessment Upper Extremity Assessment: Overall WFL for tasks assessed    Lower Extremity Assessment Lower Extremity Assessment: LLE deficits/detail;RLE deficits/detail RLE Deficits / Details: Expected post op changes; ROM: knee ~10 to  80 degrees; MMT: ankle 5/5, knee and hip 3/5 RLE Sensation: history of peripheral neuropathy LLE Deficits / Details: ROM WFL; MMT 5/5 LLE Sensation: history of peripheral neuropathy    Cervical / Trunk Assessment Cervical / Trunk Assessment: Normal  Communication   Communication: No difficulties  Cognition Arousal/Alertness: Awake/alert Behavior During Therapy: WFL for tasks assessed/performed Overall Cognitive Status: Within Functional Limits for tasks assessed                                 General Comments: Pt HOH required repetition at times with multimodal cues.  Daughter and 2 sons present        General Comments   Educated on safe ice use, no  pivots, car transfers, resting with leg straight. Also, encouraged walking every 1-2 hours during day. Educated on HEP with focus on mobility the first weeks. Discussed doing exercises within pain control and if pain increasing could decreased ROM, reps, and stop exercises as needed. Encouraged to perform quad sets and ankle pumps frequently for blood flow and to promote full knee extension.  Provided detailed multimodal (including written) instruction on all of the above to pt and family.     Exercises Total Joint Exercises Ankle Circles/Pumps: AROM, Both, 10 reps, Supine Quad Sets: AROM, Both, 10 reps, Supine Heel Slides: AAROM, Right, 5 reps, Supine Hip ABduction/ADduction: AAROM, Right, 5 reps, Supine Long Arc Quad: AROM, Right, 5 reps, Seated Knee Flexion: AROM, Right, 5 reps, Seated Goniometric ROM: R knee 10 to 80 degrees   Assessment/Plan    PT Assessment Patient needs continued PT services  PT Problem List Decreased strength;Pain;Decreased range of motion;Decreased activity tolerance;Decreased balance;Decreased mobility;Decreased knowledge of precautions;Decreased knowledge of use of DME;Impaired sensation;Decreased safety awareness       PT Treatment Interventions DME instruction;Therapeutic exercise;Gait training;Stair training;Functional mobility training;Therapeutic activities;Patient/family education;Modalities;Balance training    PT Goals (Current goals can be found in the Care Plan section)  Acute  Rehab PT Goals Patient Stated Goal: return home PT Goal Formulation: With patient/family Time For Goal Achievement: 05/20/22 Potential to Achieve Goals: Good    Frequency 7X/week     Co-evaluation               AM-PAC PT "6 Clicks" Mobility  Outcome Measure Help needed turning from your back to your side while in a flat bed without using bedrails?: A Little Help needed moving from lying on your back to sitting on the side of a flat bed without using bedrails?: A  Little Help needed moving to and from a bed to a chair (including a wheelchair)?: A Little Help needed standing up from a chair using your arms (e.g., wheelchair or bedside chair)?: A Little Help needed to walk in hospital room?: Total Help needed climbing 3-5 steps with a railing? : Total 6 Click Score: 14    End of Session Equipment Utilized During Treatment: Gait belt Activity Tolerance: Patient limited by pain Patient left: with chair alarm set;in chair;with call bell/phone within reach;with family/visitor present Nurse Communication: Mobility status PT Visit Diagnosis: Other abnormalities of gait and mobility (R26.89);Muscle weakness (generalized) (M62.81)    Time: [id number] PT Time Calculation (min) (ACUTE ONLY): 44 min   Charges:   PT Evaluation $PT Eval Low Complexity: 1 Low PT Treatments $Gait Training: 8-22 mins $Therapeutic Exercise: 8-22 mins        Abran Richard, PT Acute Rehab [facility] [phone]   Karlton Lemon 05/06/2022, 1:33 PM

## 2022-05-06 NOTE — Progress Notes (Signed)
Subjective: 1 Day Post-Op s/p Procedure(s): TOTAL KNEE ARTHROPLASTY   Patient is alert, oriented. Pain control difficult overnight, pain was severe and she was unable to sleep. Foley has not yet been removed this morning, patient has incontinence at baseline. Complains for nerve related pain down legs, cannot take gabapentin (used to be on this but this was discontinued due to heart issues). No BM yet.  Denies chest pain, SOB, Calf pain. No nausea/vomiting. No other complaints.   Objective:  PE: VITALS:   Vitals:   05/05/22 2238 05/06/22 0105 05/06/22 0449 05/06/22 0721  BP: 112/61 (!) 114/59 118/62 124/61  Pulse: 80 74 73 75  Resp: 18 18 16    Temp: 98.3 F (36.8 C) 98.2 F (36.8 C) 98.3 F (36.8 C) 98.1 F (36.7 C)  TempSrc: Oral Oral Oral Oral  SpO2: 93% 94% 98% 100%  Weight:      Height:        ABD soft Sensation intact distally Intact pulses distally Dorsiflexion/Plantar flexion intact Incision: dressing C/D/I  LABS  Results for orders placed or performed during the hospital encounter of 05/05/22 (from the past 24 hour(s))  CBC     Status: Abnormal   Collection Time: 05/06/22  3:44 AM  Result Value Ref Range   WBC 11.1 (H) 4.0 - 10.5 K/uL   RBC 3.40 (L) 3.87 - 5.11 MIL/uL   Hemoglobin 10.7 (L) 12.0 - 15.0 g/dL   HCT 32.5 (L) 36.0 - 46.0 %   MCV 95.6 80.0 - 100.0 fL   MCH 31.5 26.0 - 34.0 pg   MCHC 32.9 30.0 - 36.0 g/dL   RDW 13.2 11.5 - 15.5 %   Platelets 134 (L) 150 - 400 K/uL   nRBC 0.0 0.0 - 0.2 %  Basic metabolic panel     Status: Abnormal   Collection Time: 05/06/22  3:44 AM  Result Value Ref Range   Sodium 139 135 - 145 mmol/L   Potassium 6.9 (HH) 3.5 - 5.1 mmol/L   Chloride 110 98 - 111 mmol/L   CO2 24 22 - 32 mmol/L   Glucose, Bld 132 (H) 70 - 99 mg/dL   BUN 26 (H) 8 - 23 mg/dL   Creatinine, Ser 1.12 (H) 0.44 - 1.00 mg/dL   Calcium 7.5 (L) 8.9 - 10.3 mg/dL   GFR, Estimated 47 (L) >60 mL/min   Anion gap 5 5 - 15  Potassium     Status:  None   Collection Time: 05/06/22  7:15 AM  Result Value Ref Range   Potassium 4.6 3.5 - 5.1 mmol/L  Glucose, capillary     Status: Abnormal   Collection Time: 05/06/22  7:53 AM  Result Value Ref Range   Glucose-Capillary 193 (H) 70 - 99 mg/dL    DG Knee Right Port  Result Date: 05/05/2022 CLINICAL DATA:  [id number] S/P total knee arthroplasty, right [id number] EXAM: PORTABLE RIGHT KNEE - 1-2 VIEW COMPARISON:  None Available. FINDINGS: Components of knee arthroplasty project in expected location. No fracture or dislocation. Gas in the suprapatellar bursa. Regional soft tissues otherwise unremarkable. IMPRESSION: Right knee arthroplasty without apparent complication. Electronically Signed   By: Lucrezia Europe M.D.   On: 05/05/2022 14:19    Assessment/Plan: Principal Problem:   S/P total knee replacement, right  1 Day Post-Op s/p Procedure(s): TOTAL KNEE ARTHROPLASTY - K resulted 6.9 this morning, STAT K was ordered, was just drawn, waiting on results of this. EKG results not back yet.  Addendum: Raliegh Ip  within normal range on second draw.  Weightbearing: WBAT RLE Insicional and dressing care: Reinforce dressings as needed VTE prophylaxis: Aspirin 325mg  BID x 30 days Pain control: will increase regimen from hydrocodone to oxycodone Follow - up plan: 2 weeks with Dr. Mardelle Matte Dispo: pending progress with PT today  Contact information:   Merlene Pulling, PA-C Weekdays 8-5  After hours and holidays please check [email or web address] for group call information for [facility]  Ventura Bruns 05/06/2022, 8:40 AM

## 2022-05-07 DIAGNOSIS — Z79899 Other long term (current) drug therapy: Secondary | ICD-10-CM | POA: Diagnosis not present

## 2022-05-07 DIAGNOSIS — N1831 Chronic kidney disease, stage 3a: Secondary | ICD-10-CM | POA: Diagnosis not present

## 2022-05-07 DIAGNOSIS — M1711 Unilateral primary osteoarthritis, right knee: Secondary | ICD-10-CM | POA: Diagnosis not present

## 2022-05-07 DIAGNOSIS — Z950 Presence of cardiac pacemaker: Secondary | ICD-10-CM | POA: Diagnosis not present

## 2022-05-07 DIAGNOSIS — Z85828 Personal history of other malignant neoplasm of skin: Secondary | ICD-10-CM | POA: Diagnosis not present

## 2022-05-07 DIAGNOSIS — Z96652 Presence of left artificial knee joint: Secondary | ICD-10-CM | POA: Diagnosis not present

## 2022-05-07 DIAGNOSIS — E039 Hypothyroidism, unspecified: Secondary | ICD-10-CM | POA: Diagnosis not present

## 2022-05-07 DIAGNOSIS — I5022 Chronic systolic (congestive) heart failure: Secondary | ICD-10-CM | POA: Diagnosis not present

## 2022-05-07 DIAGNOSIS — I130 Hypertensive heart and chronic kidney disease with heart failure and stage 1 through stage 4 chronic kidney disease, or unspecified chronic kidney disease: Secondary | ICD-10-CM | POA: Diagnosis not present

## 2022-05-07 LAB — CBC
HCT: 32.6 % — ABNORMAL LOW (ref 36.0–46.0)
Hemoglobin: 10.8 g/dL — ABNORMAL LOW (ref 12.0–15.0)
MCH: 31.5 pg (ref 26.0–34.0)
MCHC: 33.1 g/dL (ref 30.0–36.0)
MCV: 95 fL (ref 80.0–100.0)
Platelets: 140 10*3/uL — ABNORMAL LOW (ref 150–400)
RBC: 3.43 MIL/uL — ABNORMAL LOW (ref 3.87–5.11)
RDW: 13.3 % (ref 11.5–15.5)
WBC: 10.9 10*3/uL — ABNORMAL HIGH (ref 4.0–10.5)
nRBC: 0 % (ref 0.0–0.2)

## 2022-05-07 MED ORDER — ACETAMINOPHEN 325 MG PO TABS: 325.0000 mg | ORAL_TABLET | ORAL | 0 refills | Status: AC | PRN

## 2022-05-07 MED ORDER — TRAMADOL HCL 50 MG PO TABS: 50.0000 mg | ORAL_TABLET | ORAL | 0 refills | Status: DC | PRN

## 2022-05-07 MED ORDER — ACETAMINOPHEN 325 MG PO TABS: 325.0000 mg | ORAL_TABLET | ORAL | 0 refills | Status: DC | PRN

## 2022-05-07 MED ORDER — TRAMADOL HCL 50 MG PO TABS: 50.0000 mg | ORAL_TABLET | ORAL | 0 refills | Status: AC | PRN

## 2022-05-07 MED ORDER — SENNA-DOCUSATE SODIUM 8.6-50 MG PO TABS: 2.0000 | ORAL_TABLET | Freq: Every day | ORAL | 1 refills | Status: DC

## 2022-05-07 MED ORDER — ONDANSETRON HCL 4 MG PO TABS: 4.0000 mg | ORAL_TABLET | Freq: Three times a day (TID) | ORAL | 0 refills | Status: DC | PRN

## 2022-05-07 MED ORDER — LIDOCAINE 5 % EX OINT: 1.0000 | TOPICAL_OINTMENT | CUTANEOUS | 0 refills | Status: AC | PRN

## 2022-05-07 MED ORDER — SENNA-DOCUSATE SODIUM 8.6-50 MG PO TABS: 2.0000 | ORAL_TABLET | Freq: Every day | ORAL | 1 refills | Status: AC

## 2022-05-07 MED ORDER — ASPIRIN 325 MG PO TBEC: 325.0000 mg | DELAYED_RELEASE_TABLET | Freq: Two times a day (BID) | ORAL | 0 refills | Status: DC

## 2022-05-07 MED ORDER — TRAMADOL HCL 50 MG PO TABS: 50.0000 mg | ORAL_TABLET | ORAL | Status: DC | PRN

## 2022-05-07 NOTE — Plan of Care (Signed)
Patient discharged home via private vehicle with children. Ivan Anchors, RN 05/07/22 4:57 PM

## 2022-05-07 NOTE — Progress Notes (Signed)
Physical Therapy Treatment Patient Details Name: Heather Smith MRN: [id number] DOB: November 16, 1932 Today's Date: 05/07/2022   History of Present Illness Pt is 89 yo female s/p R TKA on 05/06/22.  Pt with hx including but not limited to arthritis, CHF, depression, GERD, HCL, HTN, spinal stenosis, neuropathy, L TKA 2017    PT Comments    POD # 1 am session Pt in recliner and Family present.  Assisted with amb in hallway, practiced stairs then back to bed to rest. Will see pt again after lunch and expect D/C to home this afternoon.  Recommendations for follow up therapy are one component of a multi-disciplinary discharge planning process, led by the attending physician.  Recommendations may be updated based on patient status, additional functional criteria and insurance authorization.  Follow Up Recommendations       Assistance Recommended at Discharge Frequent or constant Supervision/Assistance  Patient can return home with the following A lot of help with walking and/or transfers;A lot of help with bathing/dressing/bathroom;Assistance with cooking/housework;Help with stairs or ramp for entrance   Equipment Recommendations  None recommended by PT    Recommendations for Other Services       Precautions / Restrictions Precautions Precautions: Fall;Knee Precaution Comments: instructed no pillow under knee Restrictions Weight Bearing Restrictions: No RLE Weight Bearing: Weight bearing as tolerated     Mobility  Bed Mobility Overal bed mobility: Needs Assistance Bed Mobility: Sit to Supine       Sit to supine: Supervision, Min guard   General bed mobility comments: demonstarted and instructed how to use belt to self assist LE up onto bed    Transfers Overall transfer level: Needs assistance Equipment used: Rolling walker (2 wheels) Transfers: Sit to/from Stand Sit to Stand: Supervision, Min guard           General transfer comment: had Family "hands on" assist pt from  recliner as well as back to bed.  VC's on safe handling with use of safety belt.    Ambulation/Gait Ambulation/Gait assistance: Supervision, Min guard Gait Distance (Feet): 45 Feet Assistive device: Rolling walker (2 wheels) Gait Pattern/deviations: Step-to pattern, Decreased stride length, Decreased weight shift to right, Antalgic Gait velocity: decreased     General Gait Details: had Family "hands on" assist pt with amb in hallway a functional distance of 45 feet with VC's on proper walker to self distance and to decrease step length for increased knee stability.   Stairs Stairs: Yes Stairs assistance: Min guard, Min assist Stair Management: Two rails, Step to pattern, Forwards Number of Stairs: 3 General stair comments: with Family "hands on" assisted using safety belt up/down 3 steps B rails VC's on proper sequencing as well as safe handling.   Wheelchair Mobility    Modified Rankin (Stroke Patients Only)       Balance                                            Cognition Arousal/Alertness: Awake/alert Behavior During Therapy: WFL for tasks assessed/performed Overall Cognitive Status: Within Functional Limits for tasks assessed                                 General Comments: AxO x 3 very supportive family.  @ Sons and a Daughter present.  Exercises      General Comments        Pertinent Vitals/Pain Pain Assessment Pain Assessment: 0-10 Pain Score: 7  Pain Location: R knee Pain Descriptors / Indicators: Discomfort, Operative site guarding, Sore, Grimacing Pain Intervention(s): Monitored during session, Premedicated before session, Repositioned, Ice applied    Home Living                          Prior Function            PT Goals (current goals can now be found in the care plan section) Progress towards PT goals: Progressing toward goals    Frequency    7X/week      PT Plan Current plan  remains appropriate    Co-evaluation              AM-PAC PT "6 Clicks" Mobility   Outcome Measure  Help needed turning from your back to your side while in a flat bed without using bedrails?: A Little Help needed moving from lying on your back to sitting on the side of a flat bed without using bedrails?: A Little Help needed moving to and from a bed to a chair (including a wheelchair)?: A Little Help needed standing up from a chair using your arms (e.g., wheelchair or bedside chair)?: A Little Help needed to walk in hospital room?: A Little Help needed climbing 3-5 steps with a railing? : A Little 6 Click Score: 18    End of Session Equipment Utilized During Treatment: Gait belt Activity Tolerance: Patient tolerated treatment well Patient left: with family/visitor present Nurse Communication: Mobility status PT Visit Diagnosis: Other abnormalities of gait and mobility (R26.89);Muscle weakness (generalized) (M62.81)     Time: [id number] PT Time Calculation (min) (ACUTE ONLY): 34 min  Charges:  $Gait Training: 8-22 mins $Therapeutic Activity: 8-22 mins                     Rica Koyanagi  PTA Acute  Rehabilitation Services Office M-F          [phone]

## 2022-05-07 NOTE — Progress Notes (Signed)
Physical Therapy Treatment Patient Details Name: Heather Smith MRN: [id number] DOB: [birth year] Today's Date: 05/07/2022   History of Present Illness Pt is 89 yo female s/p R TKA on 05/06/22.  Pt with hx including but not limited to arthritis, CHF, depression, GERD, HCL, HTN, spinal stenosis, neuropathy, L TKA 2017    PT Comments    POD # 1 pm session Assisted OOB to amb in hallway, practice stairs again Then returned to room to perform some TE's following HEP handout.  Instructed on proper tech, freq as well as use of ICE.   Addressed all mobility questions, discussed appropriate activity, educated on use of ICE.  Pt ready for D/C to home.   Recommendations for follow up therapy are one component of a multi-disciplinary discharge planning process, led by the attending physician.  Recommendations may be updated based on patient status, additional functional criteria and insurance authorization.  Follow Up Recommendations       Assistance Recommended at Discharge Frequent or constant Supervision/Assistance  Patient can return home with the following A lot of help with walking and/or transfers;A lot of help with bathing/dressing/bathroom;Assistance with cooking/housework;Help with stairs or ramp for entrance   Equipment Recommendations  None recommended by PT    Recommendations for Other Services       Precautions / Restrictions Precautions Precautions: Fall;Knee Precaution Comments: instructed no pillow under knee Restrictions Weight Bearing Restrictions: No RLE Weight Bearing: Weight bearing as tolerated     Mobility  Bed Mobility Overal bed mobility: Needs Assistance Bed Mobility: Supine to Sit     Supine to sit: Supervision Sit to supine: Supervision, Min guard   General bed mobility comments: demonstarted and instructed how to use belt to self assist LE up onto bed.  Self able.    Transfers Overall transfer level: Needs assistance Equipment used: Rolling walker  (2 wheels) Transfers: Sit to/from Stand Sit to Stand: Supervision           General transfer comment: had Daughter "hands on" assist pt from recliner as well as back to bed.  VC's on safe handling with use of safety belt.    Ambulation/Gait Ambulation/Gait assistance: Supervision, Min guard Gait Distance (Feet): 40 Feet Assistive device: Rolling walker (2 wheels) Gait Pattern/deviations: Step-to pattern, Decreased stride length, Decreased weight shift to right, Antalgic Gait velocity: decreased     General Gait Details: had Daughter "hands on" assist pt with amb in hallway a functional distance of 40 feet with VC's on proper walker to self distance and to decrease step length for increased knee stability.   Stairs Stairs: Yes Stairs assistance: Min guard, Min assist Stair Management: Two rails, Step to pattern, Forwards Number of Stairs: 4 General stair comments: with Daughter "hands on" assisted using safety belt up/down 2 steps B rails twice VC's on proper sequencing as well as safe handling.   Wheelchair Mobility    Modified Rankin (Stroke Patients Only)       Balance                                            Cognition Arousal/Alertness: Awake/alert Behavior During Therapy: WFL for tasks assessed/performed Overall Cognitive Status: Within Functional Limits for tasks assessed  General Comments: AxO x 3 very supportive family.  @ Sons and a Daughter present.        Exercises   Total Knee Replacement TE's following HEP handout 10 reps B LE ankle pumps 05 reps towel squeezes 05 reps knee presses 05 reps heel slides  05 reps SAQ's 05 reps SLR's 05 reps ABD Educated on use of gait belt to assist with TE's Followed by ICE    General Comments        Pertinent Vitals/Pain Pain Assessment Pain Assessment: 0-10 Pain Score: 5  Pain Location: R knee Pain Descriptors / Indicators: Discomfort,  Operative site guarding, Sore, Grimacing Pain Intervention(s): Monitored during session, Premedicated before session, Repositioned, Ice applied    Home Living                          Prior Function            PT Goals (current goals can now be found in the care plan section) Progress towards PT goals: Progressing toward goals    Frequency    7X/week      PT Plan Current plan remains appropriate    Co-evaluation              AM-PAC PT "6 Clicks" Mobility   Outcome Measure  Help needed turning from your back to your side while in a flat bed without using bedrails?: A Little Help needed moving from lying on your back to sitting on the side of a flat bed without using bedrails?: A Little Help needed moving to and from a bed to a chair (including a wheelchair)?: A Little Help needed standing up from a chair using your arms (e.g., wheelchair or bedside chair)?: A Little Help needed to walk in hospital room?: A Little Help needed climbing 3-5 steps with a railing? : A Little 6 Click Score: 18    End of Session Equipment Utilized During Treatment: Gait belt Activity Tolerance: Patient tolerated treatment well Patient left: with family/visitor present;in chair;with call bell/phone within reach Nurse Communication: Mobility status PT Visit Diagnosis: Other abnormalities of gait and mobility (R26.89);Muscle weakness (generalized) (M62.81)     Time: [id number] PT Time Calculation (min) (ACUTE ONLY): 25 min  Charges:  $Gait Training: 8-22 mins $Therapeutic Exercise: 8-22 mins                     Rica Koyanagi  PTA Acute  Rehabilitation Services Office M-F          [phone]

## 2022-05-07 NOTE — Progress Notes (Signed)
Subjective: 2 Days Post-Op s/p Procedure(s): TOTAL KNEE ARTHROPLASTY   Patient is alert, oriented. Pain control difficult yesterday due to neuropathy pain, however limited by what we can use. Improved as of this morning. Patient frustrated that she was unable to sleep well last night. Voiding well. No BM yet.  Denies chest pain, SOB, Calf pain. No nausea/vomiting. No other complaints.   Objective:  PE: VITALS:   Vitals:   05/06/22 1212 05/06/22 1517 05/06/22 2050 05/07/22 0534  BP: (!) 147/75 (!) 124/55 (!) 127/57 123/72  Pulse: 79 86 83 72  Resp: 15 18 18 18   Temp: 98.4 F (36.9 C) 98.3 F (36.8 C) 98 F (36.7 C) 98.1 F (36.7 C)  TempSrc: Oral  Oral Oral  SpO2: 93% 92% 95% 92%  Weight:      Height:        ABD soft Sensation intact distally Intact pulses distally Dorsiflexion/Plantar flexion intact Incision: dressing C/D/I  LABS  Results for orders placed or performed during the hospital encounter of 05/05/22 (from the past 24 hour(s))  Glucose, capillary     Status: Abnormal   Collection Time: 05/06/22  7:53 AM  Result Value Ref Range   Glucose-Capillary 193 (H) 70 - 99 mg/dL  Potassium     Status: None   Collection Time: 05/06/22  9:10 AM  Result Value Ref Range   Potassium 4.3 3.5 - 5.1 mmol/L  Potassium     Status: None   Collection Time: 05/06/22 11:48 AM  Result Value Ref Range   Potassium 4.4 3.5 - 5.1 mmol/L  CBC     Status: Abnormal   Collection Time: 05/07/22  3:28 AM  Result Value Ref Range   WBC 10.9 (H) 4.0 - 10.5 K/uL   RBC 3.43 (L) 3.87 - 5.11 MIL/uL   Hemoglobin 10.8 (L) 12.0 - 15.0 g/dL   HCT 32.6 (L) 36.0 - 46.0 %   MCV 95.0 80.0 - 100.0 fL   MCH 31.5 26.0 - 34.0 pg   MCHC 33.1 30.0 - 36.0 g/dL   RDW 13.3 11.5 - 15.5 %   Platelets 140 (L) 150 - 400 K/uL   nRBC 0.0 0.0 - 0.2 %    DG Knee Right Port  Result Date: 05/05/2022 CLINICAL DATA:  [id number] S/P total knee arthroplasty, right [id number] EXAM: PORTABLE RIGHT KNEE - 1-2 VIEW  COMPARISON:  None Available. FINDINGS: Components of knee arthroplasty project in expected location. No fracture or dislocation. Gas in the suprapatellar bursa. Regional soft tissues otherwise unremarkable. IMPRESSION: Right knee arthroplasty without apparent complication. Electronically Signed   By: Lucrezia Europe M.D.   On: 05/05/2022 14:19    Assessment/Plan: Principal Problem:   S/P total knee replacement, right  2 Days Post-Op s/p Procedure(s): TOTAL KNEE ARTHROPLASTY  Weightbearing: WBAT RLE Insicional and dressing care: Reinforce dressings as needed VTE prophylaxis: Aspirin 325mg  BID x 30 days Pain control: Patient states not getting adequate pain relief on hydrocodone regimen yesterday, daughter told nurses that she felt like patient was hallucinating. Hydrocodone discontinued midday. Patient has tramadol prn for moderate pain and oxycodone for severe pain with IV morphine if needed for breakthrough. I'd like to just use the tramadol as much as possible. She also has robaxin on board, if any signs of hallucinations or other issues today will d/c the robaxin.  Patient complained of severe neuropathy pain yesterday however we are limited by what we can use. She has been taken off of gabapentin in the past  due to cardiac issues so I am hesitant to use gabapentin or lyrica for her. Added on lidocaine cream as another option. Not yet complaining of neuropathy pain this morning today. Follow - up plan: 2 weeks with Dr. Mardelle Matte Dispo: Patient able to walk 60 feet with a walker yesterday, she would like to go home today, I am hopeful that she can pass stair training and discharge home later this afternoon.  Contact information:   Merlene Pulling, Hershal Coria [phone]  After hours and holidays please check [email or web address] for group call information for [facility]  Ventura Bruns 05/07/2022, 7:23 AM

## 2022-05-07 NOTE — Plan of Care (Signed)
Problem: Education: Goal: Knowledge of the prescribed therapeutic regimen will improve Outcome: Progressing   Problem: Activity: Goal: Ability to avoid complications of mobility impairment will improve Outcome: Progressing   Problem: Clinical Measurements: Goal: Postoperative complications will be avoided or minimized Outcome: Progressing   Problem: Pain Management: Goal: Pain level will decrease with appropriate interventions Outcome: [place], RN 05/07/22 9:21 AM

## 2022-05-08 DIAGNOSIS — K219 Gastro-esophageal reflux disease without esophagitis: Secondary | ICD-10-CM | POA: Diagnosis not present

## 2022-05-08 DIAGNOSIS — Z7982 Long term (current) use of aspirin: Secondary | ICD-10-CM | POA: Diagnosis not present

## 2022-05-08 DIAGNOSIS — M48062 Spinal stenosis, lumbar region with neurogenic claudication: Secondary | ICD-10-CM | POA: Diagnosis not present

## 2022-05-08 DIAGNOSIS — G629 Polyneuropathy, unspecified: Secondary | ICD-10-CM | POA: Diagnosis not present

## 2022-05-08 DIAGNOSIS — M1990 Unspecified osteoarthritis, unspecified site: Secondary | ICD-10-CM | POA: Diagnosis not present

## 2022-05-08 DIAGNOSIS — I480 Paroxysmal atrial fibrillation: Secondary | ICD-10-CM | POA: Diagnosis not present

## 2022-05-08 DIAGNOSIS — Z95810 Presence of automatic (implantable) cardiac defibrillator: Secondary | ICD-10-CM | POA: Diagnosis not present

## 2022-05-08 DIAGNOSIS — Z96651 Presence of right artificial knee joint: Secondary | ICD-10-CM | POA: Diagnosis not present

## 2022-05-08 DIAGNOSIS — E7800 Pure hypercholesterolemia, unspecified: Secondary | ICD-10-CM | POA: Diagnosis not present

## 2022-05-08 DIAGNOSIS — E039 Hypothyroidism, unspecified: Secondary | ICD-10-CM | POA: Diagnosis not present

## 2022-05-08 DIAGNOSIS — H9113 Presbycusis, bilateral: Secondary | ICD-10-CM | POA: Diagnosis not present

## 2022-05-08 DIAGNOSIS — F32A Depression, unspecified: Secondary | ICD-10-CM | POA: Diagnosis not present

## 2022-05-08 DIAGNOSIS — N1831 Chronic kidney disease, stage 3a: Secondary | ICD-10-CM | POA: Diagnosis not present

## 2022-05-08 DIAGNOSIS — I428 Other cardiomyopathies: Secondary | ICD-10-CM | POA: Diagnosis not present

## 2022-05-08 DIAGNOSIS — I130 Hypertensive heart and chronic kidney disease with heart failure and stage 1 through stage 4 chronic kidney disease, or unspecified chronic kidney disease: Secondary | ICD-10-CM | POA: Diagnosis not present

## 2022-05-08 DIAGNOSIS — R7303 Prediabetes: Secondary | ICD-10-CM | POA: Diagnosis not present

## 2022-05-08 DIAGNOSIS — E559 Vitamin D deficiency, unspecified: Secondary | ICD-10-CM | POA: Diagnosis not present

## 2022-05-08 DIAGNOSIS — Z85828 Personal history of other malignant neoplasm of skin: Secondary | ICD-10-CM | POA: Diagnosis not present

## 2022-05-08 DIAGNOSIS — Z96652 Presence of left artificial knee joint: Secondary | ICD-10-CM | POA: Diagnosis not present

## 2022-05-08 DIAGNOSIS — Z471 Aftercare following joint replacement surgery: Secondary | ICD-10-CM | POA: Diagnosis not present

## 2022-05-08 DIAGNOSIS — I5042 Chronic combined systolic (congestive) and diastolic (congestive) heart failure: Secondary | ICD-10-CM | POA: Diagnosis not present

## 2022-05-08 DIAGNOSIS — Z8616 Personal history of COVID-19: Secondary | ICD-10-CM | POA: Diagnosis not present

## 2022-05-08 DIAGNOSIS — I447 Left bundle-branch block, unspecified: Secondary | ICD-10-CM | POA: Diagnosis not present

## 2022-05-10 DIAGNOSIS — Z8616 Personal history of COVID-19: Secondary | ICD-10-CM | POA: Diagnosis not present

## 2022-05-10 DIAGNOSIS — Z471 Aftercare following joint replacement surgery: Secondary | ICD-10-CM | POA: Diagnosis not present

## 2022-05-10 DIAGNOSIS — I480 Paroxysmal atrial fibrillation: Secondary | ICD-10-CM | POA: Diagnosis not present

## 2022-05-10 DIAGNOSIS — E7800 Pure hypercholesterolemia, unspecified: Secondary | ICD-10-CM | POA: Diagnosis not present

## 2022-05-10 DIAGNOSIS — Z85828 Personal history of other malignant neoplasm of skin: Secondary | ICD-10-CM | POA: Diagnosis not present

## 2022-05-10 DIAGNOSIS — Z95810 Presence of automatic (implantable) cardiac defibrillator: Secondary | ICD-10-CM | POA: Diagnosis not present

## 2022-05-10 DIAGNOSIS — G629 Polyneuropathy, unspecified: Secondary | ICD-10-CM | POA: Diagnosis not present

## 2022-05-10 DIAGNOSIS — Z96651 Presence of right artificial knee joint: Secondary | ICD-10-CM | POA: Diagnosis not present

## 2022-05-10 DIAGNOSIS — M1990 Unspecified osteoarthritis, unspecified site: Secondary | ICD-10-CM | POA: Diagnosis not present

## 2022-05-10 DIAGNOSIS — E039 Hypothyroidism, unspecified: Secondary | ICD-10-CM | POA: Diagnosis not present

## 2022-05-10 DIAGNOSIS — E559 Vitamin D deficiency, unspecified: Secondary | ICD-10-CM | POA: Diagnosis not present

## 2022-05-10 DIAGNOSIS — H9113 Presbycusis, bilateral: Secondary | ICD-10-CM | POA: Diagnosis not present

## 2022-05-10 DIAGNOSIS — I5042 Chronic combined systolic (congestive) and diastolic (congestive) heart failure: Secondary | ICD-10-CM | POA: Diagnosis not present

## 2022-05-10 DIAGNOSIS — N1831 Chronic kidney disease, stage 3a: Secondary | ICD-10-CM | POA: Diagnosis not present

## 2022-05-10 DIAGNOSIS — I130 Hypertensive heart and chronic kidney disease with heart failure and stage 1 through stage 4 chronic kidney disease, or unspecified chronic kidney disease: Secondary | ICD-10-CM | POA: Diagnosis not present

## 2022-05-10 DIAGNOSIS — K219 Gastro-esophageal reflux disease without esophagitis: Secondary | ICD-10-CM | POA: Diagnosis not present

## 2022-05-10 DIAGNOSIS — I447 Left bundle-branch block, unspecified: Secondary | ICD-10-CM | POA: Diagnosis not present

## 2022-05-10 DIAGNOSIS — Z7982 Long term (current) use of aspirin: Secondary | ICD-10-CM | POA: Diagnosis not present

## 2022-05-10 DIAGNOSIS — I428 Other cardiomyopathies: Secondary | ICD-10-CM | POA: Diagnosis not present

## 2022-05-10 DIAGNOSIS — M48062 Spinal stenosis, lumbar region with neurogenic claudication: Secondary | ICD-10-CM | POA: Diagnosis not present

## 2022-05-10 DIAGNOSIS — F32A Depression, unspecified: Secondary | ICD-10-CM | POA: Diagnosis not present

## 2022-05-10 DIAGNOSIS — R7303 Prediabetes: Secondary | ICD-10-CM | POA: Diagnosis not present

## 2022-05-10 DIAGNOSIS — Z96652 Presence of left artificial knee joint: Secondary | ICD-10-CM | POA: Diagnosis not present

## 2022-05-11 DIAGNOSIS — Z95810 Presence of automatic (implantable) cardiac defibrillator: Secondary | ICD-10-CM | POA: Diagnosis not present

## 2022-05-11 DIAGNOSIS — H9113 Presbycusis, bilateral: Secondary | ICD-10-CM | POA: Diagnosis not present

## 2022-05-11 DIAGNOSIS — I428 Other cardiomyopathies: Secondary | ICD-10-CM | POA: Diagnosis not present

## 2022-05-11 DIAGNOSIS — I5042 Chronic combined systolic (congestive) and diastolic (congestive) heart failure: Secondary | ICD-10-CM | POA: Diagnosis not present

## 2022-05-11 DIAGNOSIS — E039 Hypothyroidism, unspecified: Secondary | ICD-10-CM | POA: Diagnosis not present

## 2022-05-11 DIAGNOSIS — Z96652 Presence of left artificial knee joint: Secondary | ICD-10-CM | POA: Diagnosis not present

## 2022-05-11 DIAGNOSIS — F32A Depression, unspecified: Secondary | ICD-10-CM | POA: Diagnosis not present

## 2022-05-11 DIAGNOSIS — N1831 Chronic kidney disease, stage 3a: Secondary | ICD-10-CM | POA: Diagnosis not present

## 2022-05-11 DIAGNOSIS — Z96651 Presence of right artificial knee joint: Secondary | ICD-10-CM | POA: Diagnosis not present

## 2022-05-11 DIAGNOSIS — E559 Vitamin D deficiency, unspecified: Secondary | ICD-10-CM | POA: Diagnosis not present

## 2022-05-11 DIAGNOSIS — I130 Hypertensive heart and chronic kidney disease with heart failure and stage 1 through stage 4 chronic kidney disease, or unspecified chronic kidney disease: Secondary | ICD-10-CM | POA: Diagnosis not present

## 2022-05-11 DIAGNOSIS — Z8616 Personal history of COVID-19: Secondary | ICD-10-CM | POA: Diagnosis not present

## 2022-05-11 DIAGNOSIS — K219 Gastro-esophageal reflux disease without esophagitis: Secondary | ICD-10-CM | POA: Diagnosis not present

## 2022-05-11 DIAGNOSIS — R7303 Prediabetes: Secondary | ICD-10-CM | POA: Diagnosis not present

## 2022-05-11 DIAGNOSIS — Z471 Aftercare following joint replacement surgery: Secondary | ICD-10-CM | POA: Diagnosis not present

## 2022-05-11 DIAGNOSIS — E7800 Pure hypercholesterolemia, unspecified: Secondary | ICD-10-CM | POA: Diagnosis not present

## 2022-05-11 DIAGNOSIS — M48062 Spinal stenosis, lumbar region with neurogenic claudication: Secondary | ICD-10-CM | POA: Diagnosis not present

## 2022-05-11 DIAGNOSIS — G629 Polyneuropathy, unspecified: Secondary | ICD-10-CM | POA: Diagnosis not present

## 2022-05-11 DIAGNOSIS — Z85828 Personal history of other malignant neoplasm of skin: Secondary | ICD-10-CM | POA: Diagnosis not present

## 2022-05-11 DIAGNOSIS — Z7982 Long term (current) use of aspirin: Secondary | ICD-10-CM | POA: Diagnosis not present

## 2022-05-11 DIAGNOSIS — I447 Left bundle-branch block, unspecified: Secondary | ICD-10-CM | POA: Diagnosis not present

## 2022-05-11 DIAGNOSIS — I480 Paroxysmal atrial fibrillation: Secondary | ICD-10-CM | POA: Diagnosis not present

## 2022-05-11 DIAGNOSIS — M1990 Unspecified osteoarthritis, unspecified site: Secondary | ICD-10-CM | POA: Diagnosis not present

## 2022-05-11 NOTE — Discharge Summary (Signed)
Discharge Summary  Patient ID: Heather Smith MRN: [id number] DOB/AGE: 87-Jul-1934 87 y.o.  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  Echocardiogram 10/05/12: EF 30-35%, diffuse HK, moderate LAE. Echocardiogram (01/2013): Lateral to septal wall dyssynchrony EF 20-25%, diffuse HK   Osteoarthritis    "about all over" (02/22/2013)   Pre-diabetes    Presence of permanent cardiac pacemaker    PVC's (premature ventricular contractions)    Spinal stenosis    Vitamin D deficiency     Surgeries: Procedure(s): TOTAL KNEE ARTHROPLASTY on 05/05/2022   Consultants (if any):   Discharged Condition: Improved  Hospital Course: Heather Garretdele B Leaming is an 89 y.o. female who was admitted 05/05/2022 with a diagnosis of S/P total knee replacement, right and went to the operating room on 05/05/2022 and underwent the above named procedures.    She was given perioperative antibiotics:  Anti-infectives (From admission, onward)    Start     Dose/Rate Route Frequency Ordered Stop   05/05/22 0815  ceFAZolin (ANCEF) IVPB 2g/100 mL premix        2 g 200 mL/hr over 30 Minutes Intravenous On call to  O.R. 05/05/22 [id number] 05/05/22 1056     .  She was given sequential compression devices, early ambulation, and aspirin for DVT prophylaxis.  She benefited maximally from the hospital stay and there were no complications.    Recent vital signs:  Vitals:   05/07/22 0534 05/07/22 1343  BP: 123/72 (!) 144/66  Pulse: 72 70  Resp: 18 18  Temp: 98.1 F (36.7 C) 97.8 F (36.6 C)  SpO2: 92% 90%    Recent laboratory studies:  Lab Results  Component Value Date   HGB 10.8 (L) 05/07/2022   HGB 10.7 (L) 05/06/2022   HGB 14.4 11/20/2021   Lab Results  Component Value Date   WBC 10.9 (H) 05/07/2022   PLT 140 (L) 05/07/2022   Lab Results  Component Value Date   INR 0.97 07/15/2015   Lab Results  Component Value Date   NA 139 05/06/2022   K 4.4 05/06/2022   CL 110 05/06/2022   CO2 24 05/06/2022   BUN 26 (H) 05/06/2022   CREATININE 1.12 (H) 05/06/2022   GLUCOSE 132 (H) 05/06/2022    Discharge Medications:   Allergies as of 05/07/2022       Reactions   Statins Other (See Comments)   "Makes her bones hurt and very sore"   Niaspan [niacin Er] Other (See Comments)   High doses, causes hot flashes & aching    Hydrocodone Itching   States she's had hallucinations  on hydrocodone in the past   Simvastatin Other (See Comments)   Muscle aches    Benadryl [diphenhydramine] Other (See Comments)   Makes pt feel jittery    Dilaudid [hydromorphone Hcl] Itching        Medication List     TAKE these medications    acetaminophen 325 MG tablet Commonly known as: TYLENOL Take 1-2 tablets (325-650 mg total) by mouth every 4 (four) hours as needed for moderate pain, headache or mild pain. What changed:  how much to take when to take this reasons to take this   aspirin EC 325 MG tablet Take 1 tablet (325 mg total) by mouth 2 (two) times daily.   carvedilol 3.125 MG tablet Commonly known as: COREG TAKE ONE TABLET BY MOUTH AT BREAKFAST AND AT BEDTIME   cyanocobalamin 1000 MCG  tablet Commonly known as: VITAMIN B12 Take 1,000 mcg by mouth daily.   diclofenac Sodium 1 % Gel Commonly known as: VOLTAREN Apply 1 Application topically at bedtime.   diphenhydramine-acetaminophen 25-500 MG Tabs tablet Commonly known as: TYLENOL PM Take 1 tablet by mouth at bedtime as needed (sleep/pain).   DULoxetine 60 MG capsule Commonly known as: CYMBALTA Take 60 mg by mouth daily.   ezetimibe 10 MG tablet Commonly known as: ZETIA Take 1 tablet by mouth once daily   flecainide 50 MG tablet Commonly known as: TAMBOCOR TAKE ONE TABLET BY MOUTH EVERY MORNING and TAKE ONE TABLET BY MOUTH EVERYDAY AT BEDTIME   furosemide 20 MG tablet Commonly known as: LASIX TAKE ONE TABLET BY MOUTH daily AS NEEDED FOR fluid (no more THAN twice PER WEEK)   levothyroxine 50 MCG tablet Commonly known as: SYNTHROID Take 50 mcg by mouth daily before breakfast.   lidocaine 5 % ointment Commonly known as: XYLOCAINE Apply 1 Application topically as needed (for neuropathy pain).   losartan 25 MG tablet Commonly known as: COZAAR Take 1 tablet (25 mg total) by mouth daily. What changed: when to take this   Magnesium 250 MG Tabs Take 250 mg by mouth daily.   multivitamin with minerals tablet Take 1 tablet by mouth daily.   ondansetron 4 MG tablet Commonly known as: Zofran Take 1 tablet (4 mg total) by mouth every 8 (eight) hours as needed for nausea or vomiting.   OVER THE COUNTER MEDICATION Take 2 each by mouth daily. Circulation Sweets Gummies   pantoprazole 40 MG tablet Commonly known as: PROTONIX Take 40 mg by mouth 2 (two) times daily.   sennosides-docusate sodium 8.6-50 MG tablet Commonly known as: SENOKOT-S Take 2 tablets by mouth daily.   spironolactone 25 MG tablet Commonly known as: ALDACTONE Take 1 tablet (25 mg total) by mouth daily.   traMADol 50 MG tablet Commonly known as: Ultram Take 1 tablet (50 mg total) by mouth every 4 (four) hours as needed for severe  pain. What changed:  when to take this reasons to take this   traZODone 50 MG tablet Commonly known as: DESYREL Take 50 mg by mouth at bedtime.   Vitamin D-3 25 MCG (1000 UT) Caps Take 1,000 Units by mouth daily.   zinc gluconate 50 MG tablet Take 50 mg by mouth daily.        Diagnostic Studies: DG Knee Right Port  Result Date: 05/05/2022 CLINICAL DATA:  [id number] S/P total knee arthroplasty, right [id number] EXAM: PORTABLE RIGHT KNEE - 1-2 VIEW COMPARISON:  None Available. FINDINGS: Components of knee arthroplasty project in expected location. No fracture or dislocation. Gas  in the suprapatellar bursa. Regional soft tissues otherwise unremarkable. IMPRESSION: Right knee arthroplasty without apparent complication. Electronically Signed   By: Corlis Leak M.D.   On: 05/05/2022 14:19    Disposition: Discharge disposition: 06-Home-Health Care Svc          Follow-up Information     Teryl Lucy, MD. Go on 05/05/2022.   Specialty: Orthopedic Surgery Why: Your appointment is scheduled for 3:30 Contact information: [address] [place] [address] [phone]         [facility] Follow up.   Specialty: Home Health Services Why: HHPT will provide 6 home visits prior to starting outpatient physical therapy Contact information: [address] [place] [address] [phone]         [facility] Follow up.   Why: Your outpatient physical therapy is scheduled to start at 2:30. Please arrive at 2:15 to complete your paperwork Contact information: [facility] [address] [place] [address] [phone]                  Signed: Annita Brod 05/11/2022, 12:32 PM

## 2022-05-13 DIAGNOSIS — I447 Left bundle-branch block, unspecified: Secondary | ICD-10-CM | POA: Diagnosis not present

## 2022-05-13 DIAGNOSIS — Z85828 Personal history of other malignant neoplasm of skin: Secondary | ICD-10-CM | POA: Diagnosis not present

## 2022-05-13 DIAGNOSIS — Z8616 Personal history of COVID-19: Secondary | ICD-10-CM | POA: Diagnosis not present

## 2022-05-13 DIAGNOSIS — E7800 Pure hypercholesterolemia, unspecified: Secondary | ICD-10-CM | POA: Diagnosis not present

## 2022-05-13 DIAGNOSIS — I5042 Chronic combined systolic (congestive) and diastolic (congestive) heart failure: Secondary | ICD-10-CM | POA: Diagnosis not present

## 2022-05-13 DIAGNOSIS — M1990 Unspecified osteoarthritis, unspecified site: Secondary | ICD-10-CM | POA: Diagnosis not present

## 2022-05-13 DIAGNOSIS — K219 Gastro-esophageal reflux disease without esophagitis: Secondary | ICD-10-CM | POA: Diagnosis not present

## 2022-05-13 DIAGNOSIS — M48062 Spinal stenosis, lumbar region with neurogenic claudication: Secondary | ICD-10-CM | POA: Diagnosis not present

## 2022-05-13 DIAGNOSIS — Z7982 Long term (current) use of aspirin: Secondary | ICD-10-CM | POA: Diagnosis not present

## 2022-05-13 DIAGNOSIS — Z95810 Presence of automatic (implantable) cardiac defibrillator: Secondary | ICD-10-CM | POA: Diagnosis not present

## 2022-05-13 DIAGNOSIS — I480 Paroxysmal atrial fibrillation: Secondary | ICD-10-CM | POA: Diagnosis not present

## 2022-05-13 DIAGNOSIS — Z96651 Presence of right artificial knee joint: Secondary | ICD-10-CM | POA: Diagnosis not present

## 2022-05-13 DIAGNOSIS — Z471 Aftercare following joint replacement surgery: Secondary | ICD-10-CM | POA: Diagnosis not present

## 2022-05-13 DIAGNOSIS — Z96652 Presence of left artificial knee joint: Secondary | ICD-10-CM | POA: Diagnosis not present

## 2022-05-13 DIAGNOSIS — I130 Hypertensive heart and chronic kidney disease with heart failure and stage 1 through stage 4 chronic kidney disease, or unspecified chronic kidney disease: Secondary | ICD-10-CM | POA: Diagnosis not present

## 2022-05-13 DIAGNOSIS — R7303 Prediabetes: Secondary | ICD-10-CM | POA: Diagnosis not present

## 2022-05-13 DIAGNOSIS — G629 Polyneuropathy, unspecified: Secondary | ICD-10-CM | POA: Diagnosis not present

## 2022-05-13 DIAGNOSIS — F32A Depression, unspecified: Secondary | ICD-10-CM | POA: Diagnosis not present

## 2022-05-13 DIAGNOSIS — I428 Other cardiomyopathies: Secondary | ICD-10-CM | POA: Diagnosis not present

## 2022-05-13 DIAGNOSIS — N1831 Chronic kidney disease, stage 3a: Secondary | ICD-10-CM | POA: Diagnosis not present

## 2022-05-13 DIAGNOSIS — E039 Hypothyroidism, unspecified: Secondary | ICD-10-CM | POA: Diagnosis not present

## 2022-05-13 DIAGNOSIS — H9113 Presbycusis, bilateral: Secondary | ICD-10-CM | POA: Diagnosis not present

## 2022-05-13 DIAGNOSIS — E559 Vitamin D deficiency, unspecified: Secondary | ICD-10-CM | POA: Diagnosis not present

## 2022-05-14 DIAGNOSIS — I447 Left bundle-branch block, unspecified: Secondary | ICD-10-CM | POA: Diagnosis not present

## 2022-05-14 DIAGNOSIS — K219 Gastro-esophageal reflux disease without esophagitis: Secondary | ICD-10-CM | POA: Diagnosis not present

## 2022-05-14 DIAGNOSIS — G629 Polyneuropathy, unspecified: Secondary | ICD-10-CM | POA: Diagnosis not present

## 2022-05-14 DIAGNOSIS — R7303 Prediabetes: Secondary | ICD-10-CM | POA: Diagnosis not present

## 2022-05-14 DIAGNOSIS — E559 Vitamin D deficiency, unspecified: Secondary | ICD-10-CM | POA: Diagnosis not present

## 2022-05-14 DIAGNOSIS — Z471 Aftercare following joint replacement surgery: Secondary | ICD-10-CM | POA: Diagnosis not present

## 2022-05-14 DIAGNOSIS — H9113 Presbycusis, bilateral: Secondary | ICD-10-CM | POA: Diagnosis not present

## 2022-05-14 DIAGNOSIS — Z96651 Presence of right artificial knee joint: Secondary | ICD-10-CM | POA: Diagnosis not present

## 2022-05-14 DIAGNOSIS — Z7982 Long term (current) use of aspirin: Secondary | ICD-10-CM | POA: Diagnosis not present

## 2022-05-14 DIAGNOSIS — I5042 Chronic combined systolic (congestive) and diastolic (congestive) heart failure: Secondary | ICD-10-CM | POA: Diagnosis not present

## 2022-05-14 DIAGNOSIS — F32A Depression, unspecified: Secondary | ICD-10-CM | POA: Diagnosis not present

## 2022-05-14 DIAGNOSIS — M48062 Spinal stenosis, lumbar region with neurogenic claudication: Secondary | ICD-10-CM | POA: Diagnosis not present

## 2022-05-14 DIAGNOSIS — E039 Hypothyroidism, unspecified: Secondary | ICD-10-CM | POA: Diagnosis not present

## 2022-05-14 DIAGNOSIS — Z85828 Personal history of other malignant neoplasm of skin: Secondary | ICD-10-CM | POA: Diagnosis not present

## 2022-05-14 DIAGNOSIS — I480 Paroxysmal atrial fibrillation: Secondary | ICD-10-CM | POA: Diagnosis not present

## 2022-05-14 DIAGNOSIS — I130 Hypertensive heart and chronic kidney disease with heart failure and stage 1 through stage 4 chronic kidney disease, or unspecified chronic kidney disease: Secondary | ICD-10-CM | POA: Diagnosis not present

## 2022-05-14 DIAGNOSIS — Z95810 Presence of automatic (implantable) cardiac defibrillator: Secondary | ICD-10-CM | POA: Diagnosis not present

## 2022-05-14 DIAGNOSIS — N1831 Chronic kidney disease, stage 3a: Secondary | ICD-10-CM | POA: Diagnosis not present

## 2022-05-14 DIAGNOSIS — M1990 Unspecified osteoarthritis, unspecified site: Secondary | ICD-10-CM | POA: Diagnosis not present

## 2022-05-14 DIAGNOSIS — Z8616 Personal history of COVID-19: Secondary | ICD-10-CM | POA: Diagnosis not present

## 2022-05-14 DIAGNOSIS — I428 Other cardiomyopathies: Secondary | ICD-10-CM | POA: Diagnosis not present

## 2022-05-14 DIAGNOSIS — E7800 Pure hypercholesterolemia, unspecified: Secondary | ICD-10-CM | POA: Diagnosis not present

## 2022-05-14 DIAGNOSIS — Z96652 Presence of left artificial knee joint: Secondary | ICD-10-CM | POA: Diagnosis not present

## 2022-05-17 DIAGNOSIS — E559 Vitamin D deficiency, unspecified: Secondary | ICD-10-CM | POA: Diagnosis not present

## 2022-05-17 DIAGNOSIS — I428 Other cardiomyopathies: Secondary | ICD-10-CM | POA: Diagnosis not present

## 2022-05-17 DIAGNOSIS — Z7982 Long term (current) use of aspirin: Secondary | ICD-10-CM | POA: Diagnosis not present

## 2022-05-17 DIAGNOSIS — I130 Hypertensive heart and chronic kidney disease with heart failure and stage 1 through stage 4 chronic kidney disease, or unspecified chronic kidney disease: Secondary | ICD-10-CM | POA: Diagnosis not present

## 2022-05-17 DIAGNOSIS — I480 Paroxysmal atrial fibrillation: Secondary | ICD-10-CM | POA: Diagnosis not present

## 2022-05-17 DIAGNOSIS — I5042 Chronic combined systolic (congestive) and diastolic (congestive) heart failure: Secondary | ICD-10-CM | POA: Diagnosis not present

## 2022-05-17 DIAGNOSIS — M48062 Spinal stenosis, lumbar region with neurogenic claudication: Secondary | ICD-10-CM | POA: Diagnosis not present

## 2022-05-17 DIAGNOSIS — F32A Depression, unspecified: Secondary | ICD-10-CM | POA: Diagnosis not present

## 2022-05-17 DIAGNOSIS — K219 Gastro-esophageal reflux disease without esophagitis: Secondary | ICD-10-CM | POA: Diagnosis not present

## 2022-05-17 DIAGNOSIS — Z471 Aftercare following joint replacement surgery: Secondary | ICD-10-CM | POA: Diagnosis not present

## 2022-05-17 DIAGNOSIS — Z96652 Presence of left artificial knee joint: Secondary | ICD-10-CM | POA: Diagnosis not present

## 2022-05-17 DIAGNOSIS — H9113 Presbycusis, bilateral: Secondary | ICD-10-CM | POA: Diagnosis not present

## 2022-05-17 DIAGNOSIS — Z85828 Personal history of other malignant neoplasm of skin: Secondary | ICD-10-CM | POA: Diagnosis not present

## 2022-05-17 DIAGNOSIS — N1831 Chronic kidney disease, stage 3a: Secondary | ICD-10-CM | POA: Diagnosis not present

## 2022-05-17 DIAGNOSIS — M1990 Unspecified osteoarthritis, unspecified site: Secondary | ICD-10-CM | POA: Diagnosis not present

## 2022-05-17 DIAGNOSIS — Z8616 Personal history of COVID-19: Secondary | ICD-10-CM | POA: Diagnosis not present

## 2022-05-17 DIAGNOSIS — E039 Hypothyroidism, unspecified: Secondary | ICD-10-CM | POA: Diagnosis not present

## 2022-05-17 DIAGNOSIS — I447 Left bundle-branch block, unspecified: Secondary | ICD-10-CM | POA: Diagnosis not present

## 2022-05-17 DIAGNOSIS — R7303 Prediabetes: Secondary | ICD-10-CM | POA: Diagnosis not present

## 2022-05-17 DIAGNOSIS — E7800 Pure hypercholesterolemia, unspecified: Secondary | ICD-10-CM | POA: Diagnosis not present

## 2022-05-17 DIAGNOSIS — Z96651 Presence of right artificial knee joint: Secondary | ICD-10-CM | POA: Diagnosis not present

## 2022-05-17 DIAGNOSIS — Z95810 Presence of automatic (implantable) cardiac defibrillator: Secondary | ICD-10-CM | POA: Diagnosis not present

## 2022-05-17 DIAGNOSIS — G629 Polyneuropathy, unspecified: Secondary | ICD-10-CM | POA: Diagnosis not present

## 2022-05-18 DIAGNOSIS — M1711 Unilateral primary osteoarthritis, right knee: Secondary | ICD-10-CM | POA: Diagnosis not present

## 2022-05-18 DIAGNOSIS — M25661 Stiffness of right knee, not elsewhere classified: Secondary | ICD-10-CM | POA: Diagnosis not present

## 2022-05-18 DIAGNOSIS — R262 Difficulty in walking, not elsewhere classified: Secondary | ICD-10-CM | POA: Diagnosis not present

## 2022-05-18 DIAGNOSIS — M6281 Muscle weakness (generalized): Secondary | ICD-10-CM | POA: Diagnosis not present

## 2022-05-18 DIAGNOSIS — Z96652 Presence of left artificial knee joint: Secondary | ICD-10-CM | POA: Diagnosis not present

## 2022-05-20 DIAGNOSIS — M6281 Muscle weakness (generalized): Secondary | ICD-10-CM | POA: Diagnosis not present

## 2022-05-20 DIAGNOSIS — R262 Difficulty in walking, not elsewhere classified: Secondary | ICD-10-CM | POA: Diagnosis not present

## 2022-05-20 DIAGNOSIS — M1711 Unilateral primary osteoarthritis, right knee: Secondary | ICD-10-CM | POA: Diagnosis not present

## 2022-05-20 DIAGNOSIS — M25661 Stiffness of right knee, not elsewhere classified: Secondary | ICD-10-CM | POA: Diagnosis not present

## 2022-05-25 DIAGNOSIS — R262 Difficulty in walking, not elsewhere classified: Secondary | ICD-10-CM | POA: Diagnosis not present

## 2022-05-25 DIAGNOSIS — M6281 Muscle weakness (generalized): Secondary | ICD-10-CM | POA: Diagnosis not present

## 2022-05-25 DIAGNOSIS — M1711 Unilateral primary osteoarthritis, right knee: Secondary | ICD-10-CM | POA: Diagnosis not present

## 2022-05-25 DIAGNOSIS — M25661 Stiffness of right knee, not elsewhere classified: Secondary | ICD-10-CM | POA: Diagnosis not present

## 2022-05-27 DIAGNOSIS — M6281 Muscle weakness (generalized): Secondary | ICD-10-CM | POA: Diagnosis not present

## 2022-05-27 DIAGNOSIS — M1711 Unilateral primary osteoarthritis, right knee: Secondary | ICD-10-CM | POA: Diagnosis not present

## 2022-05-27 DIAGNOSIS — R262 Difficulty in walking, not elsewhere classified: Secondary | ICD-10-CM | POA: Diagnosis not present

## 2022-05-27 DIAGNOSIS — M25661 Stiffness of right knee, not elsewhere classified: Secondary | ICD-10-CM | POA: Diagnosis not present

## 2022-06-01 DIAGNOSIS — M1711 Unilateral primary osteoarthritis, right knee: Secondary | ICD-10-CM | POA: Diagnosis not present

## 2022-06-01 DIAGNOSIS — R262 Difficulty in walking, not elsewhere classified: Secondary | ICD-10-CM | POA: Diagnosis not present

## 2022-06-01 DIAGNOSIS — M25661 Stiffness of right knee, not elsewhere classified: Secondary | ICD-10-CM | POA: Diagnosis not present

## 2022-06-01 DIAGNOSIS — M6281 Muscle weakness (generalized): Secondary | ICD-10-CM | POA: Diagnosis not present

## 2022-06-03 DIAGNOSIS — M1711 Unilateral primary osteoarthritis, right knee: Secondary | ICD-10-CM | POA: Diagnosis not present

## 2022-06-03 DIAGNOSIS — M25661 Stiffness of right knee, not elsewhere classified: Secondary | ICD-10-CM | POA: Diagnosis not present

## 2022-06-03 DIAGNOSIS — R262 Difficulty in walking, not elsewhere classified: Secondary | ICD-10-CM | POA: Diagnosis not present

## 2022-06-03 DIAGNOSIS — M6281 Muscle weakness (generalized): Secondary | ICD-10-CM | POA: Diagnosis not present

## 2022-06-09 ENCOUNTER — Other Ambulatory Visit: Payer: Self-pay | Admitting: Family Medicine

## 2022-06-09 ENCOUNTER — Other Ambulatory Visit (HOSPITAL_COMMUNITY): Payer: Self-pay | Admitting: Cardiology

## 2022-06-09 DIAGNOSIS — E782 Mixed hyperlipidemia: Secondary | ICD-10-CM | POA: Diagnosis not present

## 2022-06-09 DIAGNOSIS — I130 Hypertensive heart and chronic kidney disease with heart failure and stage 1 through stage 4 chronic kidney disease, or unspecified chronic kidney disease: Secondary | ICD-10-CM | POA: Diagnosis not present

## 2022-06-09 DIAGNOSIS — E039 Hypothyroidism, unspecified: Secondary | ICD-10-CM | POA: Diagnosis not present

## 2022-06-09 DIAGNOSIS — N1831 Chronic kidney disease, stage 3a: Secondary | ICD-10-CM | POA: Diagnosis not present

## 2022-06-09 DIAGNOSIS — I5022 Chronic systolic (congestive) heart failure: Secondary | ICD-10-CM | POA: Diagnosis not present

## 2022-06-09 DIAGNOSIS — I10 Essential (primary) hypertension: Secondary | ICD-10-CM | POA: Diagnosis not present

## 2022-06-09 DIAGNOSIS — K219 Gastro-esophageal reflux disease without esophagitis: Secondary | ICD-10-CM | POA: Diagnosis not present

## 2022-06-09 DIAGNOSIS — F324 Major depressive disorder, single episode, in partial remission: Secondary | ICD-10-CM | POA: Diagnosis not present

## 2022-06-09 DIAGNOSIS — Z95810 Presence of automatic (implantable) cardiac defibrillator: Secondary | ICD-10-CM | POA: Diagnosis not present

## 2022-06-09 DIAGNOSIS — R7303 Prediabetes: Secondary | ICD-10-CM | POA: Diagnosis not present

## 2022-06-11 DIAGNOSIS — M1711 Unilateral primary osteoarthritis, right knee: Secondary | ICD-10-CM | POA: Diagnosis not present

## 2022-06-11 DIAGNOSIS — M25661 Stiffness of right knee, not elsewhere classified: Secondary | ICD-10-CM | POA: Diagnosis not present

## 2022-06-11 DIAGNOSIS — M6281 Muscle weakness (generalized): Secondary | ICD-10-CM | POA: Diagnosis not present

## 2022-06-11 DIAGNOSIS — R262 Difficulty in walking, not elsewhere classified: Secondary | ICD-10-CM | POA: Diagnosis not present

## 2022-06-15 DIAGNOSIS — M1711 Unilateral primary osteoarthritis, right knee: Secondary | ICD-10-CM | POA: Diagnosis not present

## 2022-06-18 ENCOUNTER — Ambulatory Visit (INDEPENDENT_AMBULATORY_CARE_PROVIDER_SITE_OTHER): Payer: Medicare HMO

## 2022-06-18 DIAGNOSIS — I428 Other cardiomyopathies: Secondary | ICD-10-CM | POA: Diagnosis not present

## 2022-06-18 DIAGNOSIS — M1711 Unilateral primary osteoarthritis, right knee: Secondary | ICD-10-CM | POA: Diagnosis not present

## 2022-06-18 DIAGNOSIS — M6281 Muscle weakness (generalized): Secondary | ICD-10-CM | POA: Diagnosis not present

## 2022-06-18 DIAGNOSIS — R262 Difficulty in walking, not elsewhere classified: Secondary | ICD-10-CM | POA: Diagnosis not present

## 2022-06-18 DIAGNOSIS — M25661 Stiffness of right knee, not elsewhere classified: Secondary | ICD-10-CM | POA: Diagnosis not present

## 2022-06-18 LAB — CUP PACEART REMOTE DEVICE CHECK
Battery Remaining Longevity: 16 mo
Battery Voltage: 2.9 V
Brady Statistic AP VP Percent: 23.23 %
Brady Statistic AP VS Percent: 0.3 %
Brady Statistic AS VP Percent: 74.06 %
Brady Statistic AS VS Percent: 2.41 %
Brady Statistic RA Percent Paced: 23.04 %
Brady Statistic RV Percent Paced: 95.19 %
Date Time Interrogation Session: 20240516042404
HighPow Impedance: 67 Ohm
Implantable Lead Connection Status: 753985
Implantable Lead Connection Status: 753985
Implantable Lead Connection Status: 753985
Implantable Lead Implant Date: 20150121
Implantable Lead Implant Date: 20150121
Implantable Lead Implant Date: 20150121
Implantable Lead Location: 753858
Implantable Lead Location: 753859
Implantable Lead Location: 753860
Implantable Lead Model: 4298
Implantable Lead Model: 5076
Implantable Pulse Generator Implant Date: 20200717
Lead Channel Impedance Value: 184.154
Lead Channel Impedance Value: 188.1 Ohm
Lead Channel Impedance Value: 195.429
Lead Channel Impedance Value: 204.14 Ohm
Lead Channel Impedance Value: 218.087
Lead Channel Impedance Value: 342 Ohm
Lead Channel Impedance Value: 399 Ohm
Lead Channel Impedance Value: 399 Ohm
Lead Channel Impedance Value: 418 Ohm
Lead Channel Impedance Value: 456 Ohm
Lead Channel Impedance Value: 456 Ohm
Lead Channel Impedance Value: 513 Ohm
Lead Channel Impedance Value: 532 Ohm
Lead Channel Impedance Value: 608 Ohm
Lead Channel Impedance Value: 646 Ohm
Lead Channel Impedance Value: 665 Ohm
Lead Channel Impedance Value: 703 Ohm
Lead Channel Impedance Value: 722 Ohm
Lead Channel Pacing Threshold Amplitude: 0.625 V
Lead Channel Pacing Threshold Amplitude: 0.75 V
Lead Channel Pacing Threshold Amplitude: 1.625 V
Lead Channel Pacing Threshold Pulse Width: 0.4 ms
Lead Channel Pacing Threshold Pulse Width: 0.4 ms
Lead Channel Pacing Threshold Pulse Width: 1 ms
Lead Channel Sensing Intrinsic Amplitude: 14 mV
Lead Channel Sensing Intrinsic Amplitude: 14 mV
Lead Channel Sensing Intrinsic Amplitude: 3.125 mV
Lead Channel Sensing Intrinsic Amplitude: 3.125 mV
Lead Channel Setting Pacing Amplitude: 1.5 V
Lead Channel Setting Pacing Amplitude: 2 V
Lead Channel Setting Pacing Amplitude: 2.75 V
Lead Channel Setting Pacing Pulse Width: 0.4 ms
Lead Channel Setting Pacing Pulse Width: 1 ms
Lead Channel Setting Sensing Sensitivity: 0.3 mV
Zone Setting Status: 755011
Zone Setting Status: 755011

## 2022-06-22 ENCOUNTER — Encounter: Payer: Medicare HMO | Admitting: Internal Medicine

## 2022-06-30 ENCOUNTER — Encounter: Payer: Self-pay | Admitting: Internal Medicine

## 2022-06-30 ENCOUNTER — Ambulatory Visit: Payer: Medicare HMO | Attending: Internal Medicine | Admitting: Internal Medicine

## 2022-06-30 VITALS — BP 142/78 | HR 78 | Ht 67.0 in | Wt 188.4 lb

## 2022-06-30 DIAGNOSIS — I493 Ventricular premature depolarization: Secondary | ICD-10-CM

## 2022-06-30 DIAGNOSIS — I5022 Chronic systolic (congestive) heart failure: Secondary | ICD-10-CM | POA: Diagnosis not present

## 2022-06-30 DIAGNOSIS — Z95810 Presence of automatic (implantable) cardiac defibrillator: Secondary | ICD-10-CM | POA: Diagnosis not present

## 2022-06-30 DIAGNOSIS — I428 Other cardiomyopathies: Secondary | ICD-10-CM | POA: Diagnosis not present

## 2022-06-30 DIAGNOSIS — Z79899 Other long term (current) drug therapy: Secondary | ICD-10-CM

## 2022-06-30 MED ORDER — FUROSEMIDE 20 MG PO TABS: ORAL_TABLET | ORAL | 11 refills | Status: DC

## 2022-06-30 MED ORDER — CARVEDILOL 6.25 MG PO TABS: 6.2500 mg | ORAL_TABLET | Freq: Two times a day (BID) | ORAL | 3 refills | Status: DC

## 2022-06-30 MED ORDER — SPIRONOLACTONE 25 MG PO TABS: 12.5000 mg | ORAL_TABLET | Freq: Every day | ORAL | 3 refills | Status: DC

## 2022-06-30 NOTE — Patient Instructions (Signed)
Medication Instructions:  Your physician has recommended you make the following change in your medication:   ** Increase Furosemide 20mg  - 1 tablet by mouth 4 times per week.  ** IN 3 WEEKS - stop Carvedilol 3.125mg   and begin Carvedilol 6.25mg  - 1 tablet by mouth twice daily.  ** Decrease Spironolactone 25mg  to 1/2 tablet by mouth daily    *If you need a refill on your cardiac medications before your next appointment, please call your pharmacy*   Lab Work: BMET in 6 weeks If you have labs (blood work) drawn today and your tests are completely normal, you will receive your results only by: MyChart Message (if you have MyChart) OR A paper copy in the mail If you have any lab test that is abnormal or we need to change your treatment, we will call you to review the results.   Testing/Procedures: None ordered.    Follow-Up: At [facility], you and your health needs are our priority.  As part of our continuing mission to provide you with exceptional heart care, we have created designated Provider Care Teams.  These Care Teams include your primary Cardiologist (physician) and Advanced Practice Providers (APPs -  Physician Assistants and Nurse Practitioners) who all work together to provide you with the care you need, when you need it.  We recommend signing up for the patient portal called "MyChart".  Sign up information is provided on this After Visit Summary.  MyChart is used to connect with patients for Virtual Visits (Telemedicine).  Patients are able to view lab/test results, encounter notes, upcoming appointments, etc.  Non-urgent messages can be sent to your provider as well.   To learn more about what you can do with MyChart, go to [email or web address].    Your next appointment:   6 weeks with Dr Odessa Fleming PA

## 2022-06-30 NOTE — Progress Notes (Signed)
5//      Patient Care Team: Johny Blamer, MD as PCP - General Duke Salvia, MD as PCP - Electrophysiology (Cardiology)   HPI  Heather Smith is a 89 y.o. female Seen in followup for CRT implantation for nonischemic cardiomyopathy and depressed LV function. This was accomplished fall 2014  change out 2020  Interval partial lead dislodgment noted after she saw Dr. Jamse Mead in March and an x-ray was obtained and reviewed. This  x-ray was reviewed. We reprogrammed the device    I reviewed with her the data from her CPX 9/16 showed excellent functional capacity and her echo from last week demonstrated normal LV function  DATE TEST EF   12/14 Echo  20-25%   1/17    Echo   55-62 %   1/18    Echo  55-60 %   11/19 MYOVIEW 75% NO ischemia   1/20 Echo  50-55% 2 MR jets-overall mild  12/22 Echo   55-60%   11/23 Echo   60-65%    Recent PYP scan inclusive because of neuropathy;   Her husband died Jun 28, 2022 from melanoma diagnosed to death time about 5 weeks they have been married almost 69 years she met him when she was 75.  Interval total knee  The patient denies chest pain, nocturnal dyspnea, orthopnea or peripheral edema.  There have been no palpitations, lightheadedness or syncope.  Complains of  chronic shortness of breath.   Date Cr K  8//18 1.07 4.8  10/19  1.11 4.8  1/22 1.18 4.4  4/23 1.13 4.3   DATE QRS Paced duration Flecainide  7/18 144    12/18 168 50  12/19 174 50  7/20 182 50  7/23 162 50  2022-06-28 178 50      Past Medical History:  Diagnosis Date   Cancer (HCC)    back of right leg. squamus cell   Cardiac resynchronization therapy defibrillator (CRT-D) in place    Chronic kidney disease, stage 3a (HCC)    Chronic systolic CHF (congestive heart failure) (HCC)    Complication of anesthesia    difficult to wake up and blood pressure drops   Depression    Dyspnea    GERD (gastroesophageal reflux disease)    Hypercholesterolemia    Hypertension     Hypothyroidism    Left bundle branch block    LV lead partial dislodgment 05/30/2013   Neurogenic claudication due to lumbar spinal stenosis    Neuropathy    Non-ischemic cardiomyopathy (HCC)    LHC 10/04/12: Normal coronary arteries, EF 25-30% with global HK.  Echocardiogram 10/05/12: EF 30-35%, diffuse HK, moderate LAE. Echocardiogram (01/2013): Lateral to septal wall dyssynchrony EF 20-25%, diffuse HK   Osteoarthritis    "about all over" (02/22/2013)   Pre-diabetes    Presence of permanent cardiac pacemaker    PVC's (premature ventricular contractions)    Spinal stenosis    Vitamin D deficiency     Past Surgical History:  Procedure Laterality Date   APPENDECTOMY     BI-VENTRICULAR IMPLANTABLE CARDIOVERTER DEFIBRILLATOR N/A 02/22/2013   Procedure: BI-VENTRICULAR IMPLANTABLE CARDIOVERTER DEFIBRILLATOR  (CRT-D);  Surgeon: Duke Salvia, MD;  Location: [facility] CATH LAB;  Service: Cardiovascular;  Laterality: N/A;   BIV ICD GENERATOR CHANGEOUT N/A 08/19/2018   Procedure: BIV ICD GENERATOR CHANGEOUT;  Surgeon: Duke Salvia, MD;  Location: [facility] INVASIVE CV LAB;  Service: Cardiovascular;  Laterality: N/A;   BLEPHAROPLASTY Bilateral    EXCISIONAL HEMORRHOIDECTOMY  FOOT ARTHRODESIS, MODIFIED MCBRIDE Bilateral    KNEE ARTHROSCOPY Right    LEFT HEART CATHETERIZATION WITH CORONARY ANGIOGRAM N/A 10/04/2012   Procedure: LEFT HEART CATHETERIZATION WITH CORONARY ANGIOGRAM;  Surgeon: Peter M Swaziland, MD;  Location: [facility] CATH LAB;  Service: Cardiovascular;  Laterality: N/A;   LUMBAR LAMINECTOMY  11/03/2006    Bilateral 3, 4, 5 laminectomy, partial L2, decompression of the thecal sac, foraminotomy, posterolateral arthrodesis L3-S1 with autograft   -- SURGEON:  Hilda Lias, M.D.    TONSILLECTOMY     TOTAL KNEE ARTHROPLASTY Left 07/15/2015   Procedure: LEFT TOTAL KNEE ARTHROPLASTY;  Surgeon: Gean Birchwood, MD;  Location: [facility] OR;  Service: Orthopedics;  Laterality: Left;   TOTAL KNEE ARTHROPLASTY Right 05/05/2022    Procedure: TOTAL KNEE ARTHROPLASTY;  Surgeon: Teryl Lucy, MD;  Location: [facility] ORS;  Service: Orthopedics;  Laterality: Right;   TUBAL LIGATION     VAGINAL HYSTERECTOMY      Current Outpatient Medications  Medication Sig Dispense Refill   acetaminophen (TYLENOL) 325 MG tablet Take 1-2 tablets (325-650 mg total) by mouth every 4 (four) hours as needed for moderate pain, headache or mild pain. 60 tablet 0   aspirin EC 325 MG tablet Take 1 tablet (325 mg total) by mouth 2 (two) times daily. 60 tablet 0   carvedilol (COREG) 3.125 MG tablet TAKE ONE TABLET BY MOUTH AT BREAKFAST AND AT BEDTIME 90 tablet 4   Cholecalciferol (VITAMIN D-3) 25 MCG (1000 UT) CAPS Take 1,000 Units by mouth daily.     diclofenac Sodium (VOLTAREN) 1 % GEL Apply 1 Application topically at bedtime.     diphenhydramine-acetaminophen (TYLENOL PM) 25-500 MG TABS tablet Take 1 tablet by mouth at bedtime as needed (sleep/pain).     DULoxetine (CYMBALTA) 60 MG capsule Take 60 mg by mouth daily.     ezetimibe (ZETIA) 10 MG tablet Take 1 tablet by mouth once daily 90 tablet 0   flecainide (TAMBOCOR) 50 MG tablet TAKE ONE TABLET BY MOUTH EVERY MORNING and TAKE ONE TABLET BY MOUTH EVERYDAY AT BEDTIME 180 tablet 3   furosemide (LASIX) 20 MG tablet TAKE ONE TABLET BY MOUTH daily AS NEEDED FOR fluid (no more THAN twice PER WEEK) 8 tablet 11   levothyroxine (SYNTHROID, LEVOTHROID) 50 MCG tablet Take 50 mcg by mouth daily before breakfast.     lidocaine (XYLOCAINE) 5 % ointment Apply 1 Application topically as needed (for neuropathy pain). 35.44 g 0   losartan (COZAAR) 25 MG tablet Take 1 tablet (25 mg total) by mouth daily. (Patient taking differently: Take 25 mg by mouth at bedtime.) 90 tablet 3   Magnesium 250 MG TABS Take 250 mg by mouth daily.     Multiple Vitamins-Minerals (MULTIVITAMIN WITH MINERALS) tablet Take 1 tablet by mouth daily.     ondansetron (ZOFRAN) 4 MG tablet Take 1 tablet (4 mg total) by mouth every 8 (eight) hours as  needed for nausea or vomiting. 10 tablet 0   OVER THE COUNTER MEDICATION Take 2 each by mouth daily. Circulation Sweets Gummies     pantoprazole (PROTONIX) 40 MG tablet Take 40 mg by mouth 2 (two) times daily.     sennosides-docusate sodium (SENOKOT-S) 8.6-50 MG tablet Take 2 tablets by mouth daily. 30 tablet 1   spironolactone (ALDACTONE) 25 MG tablet Take 1 tablet (25 mg total) by mouth daily. 90 tablet 3   traMADol (ULTRAM) 50 MG tablet Take 1 tablet (50 mg total) by mouth every 4 (four) hours as needed for  severe pain. 30 tablet 0   traZODone (DESYREL) 50 MG tablet Take 50 mg by mouth at bedtime.     vitamin B-12 (CYANOCOBALAMIN) 1000 MCG tablet Take 1,000 mcg by mouth daily.     zinc gluconate 50 MG tablet Take 50 mg by mouth daily.     No current facility-administered medications for this visit.    Allergies  Allergen Reactions   Statins Other (See Comments)    "Makes her bones hurt and very sore"   Niaspan [Niacin Er] Other (See Comments)    High doses, causes hot flashes & aching    Hydrocodone Itching    States she's had hallucinations on hydrocodone in the past   Simvastatin Other (See Comments)    Muscle aches    Benadryl [Diphenhydramine] Other (See Comments)    Makes pt feel jittery    Dilaudid [Hydromorphone Hcl] Itching    Review of Systems negative except from HPI and PMH  Physical Exam There were no vitals taken for this visit. Well developed and well nourished in no acute distress HENT normal Neck supple with JVP-flat Clear Device pocket well healed; without hematoma or erythema.  There is no tethering  Regular rate and rhythm, no  gallop No / murmur Abd-soft with active BS No Clubbing cyanosis edema Skin-warm and dry A & Oriented  Grossly normal sensory and motor function  ECG AV pacing at 78  Device function is normal. Programming changes   See Paceart for details    Assessment and  Plan  Nonischemic cardiomyopathy-- interval  normalization  Congestive heart failure-chronic-diastolic    [employer] ICD-CRT  T   Atrial fibrillation-paroxysmal  LV threshold change  Chronotropic incompetence  Depression   PVCs  Chest pain   Functional status is stable.  With chronic dyspnea.  With normalized left ventricular function, HFpEF, and we have discussed the physiology of it.  Will undertake 2 strategies, the first is to increase her furosemide to 20 mg taken 4 days a week not as needed concomitantly, we will decrease her spironolactone from 25--12.5.  In addition, in 3 weeks so as to be able to distinguish 1 from effect from the other we will increase her carvedilol from 3.125--6.25 twice daily so as to slow down her chronotropic response.  She is programmed DDDR which I probably should have inactivated rate response but has chosen not to because she does not really atrial pace to speak of except that her lower rate limit  PVCs are quiescient about 3 or 4%  LVEF has normalized.      Mood is stable.

## 2022-07-02 NOTE — Progress Notes (Signed)
Remote ICD transmission.   

## 2022-07-13 DIAGNOSIS — M1711 Unilateral primary osteoarthritis, right knee: Secondary | ICD-10-CM | POA: Diagnosis not present

## 2022-08-10 DIAGNOSIS — H02403 Unspecified ptosis of bilateral eyelids: Secondary | ICD-10-CM | POA: Diagnosis not present

## 2022-08-10 DIAGNOSIS — H2513 Age-related nuclear cataract, bilateral: Secondary | ICD-10-CM | POA: Diagnosis not present

## 2022-08-10 DIAGNOSIS — H25043 Posterior subcapsular polar age-related cataract, bilateral: Secondary | ICD-10-CM | POA: Diagnosis not present

## 2022-08-10 DIAGNOSIS — H43813 Vitreous degeneration, bilateral: Secondary | ICD-10-CM | POA: Diagnosis not present

## 2022-08-11 ENCOUNTER — Ambulatory Visit: Payer: Medicare HMO | Attending: Internal Medicine

## 2022-08-11 DIAGNOSIS — Z79899 Other long term (current) drug therapy: Secondary | ICD-10-CM

## 2022-08-11 DIAGNOSIS — I5022 Chronic systolic (congestive) heart failure: Secondary | ICD-10-CM | POA: Diagnosis not present

## 2022-08-11 DIAGNOSIS — Z95810 Presence of automatic (implantable) cardiac defibrillator: Secondary | ICD-10-CM

## 2022-08-11 DIAGNOSIS — I428 Other cardiomyopathies: Secondary | ICD-10-CM | POA: Diagnosis not present

## 2022-08-11 DIAGNOSIS — I493 Ventricular premature depolarization: Secondary | ICD-10-CM | POA: Diagnosis not present

## 2022-08-12 LAB — BASIC METABOLIC PANEL
BUN/Creatinine Ratio: 19 (ref 12–28)
BUN: 20 mg/dL (ref 8–27)
CO2: 27 mmol/L (ref 20–29)
Calcium: 9.8 mg/dL (ref 8.7–10.3)
Chloride: 101 mmol/L (ref 96–106)
Creatinine, Ser: 1.08 mg/dL — ABNORMAL HIGH (ref 0.57–1.00)
Glucose: 97 mg/dL (ref 70–99)
Potassium: 4.9 mmol/L (ref 3.5–5.2)
Sodium: 142 mmol/L (ref 134–144)
eGFR: 49 mL/min/{1.73_m2} — ABNORMAL LOW (ref >59)

## 2022-08-18 ENCOUNTER — Encounter: Payer: Self-pay | Admitting: Student

## 2022-08-18 ENCOUNTER — Ambulatory Visit: Payer: Medicare HMO | Attending: Student | Admitting: Student

## 2022-08-18 VITALS — BP 126/86 | HR 74 | Ht 67.0 in | Wt 186.0 lb

## 2022-08-18 DIAGNOSIS — I10 Essential (primary) hypertension: Secondary | ICD-10-CM | POA: Diagnosis not present

## 2022-08-18 DIAGNOSIS — I493 Ventricular premature depolarization: Secondary | ICD-10-CM

## 2022-08-18 DIAGNOSIS — I480 Paroxysmal atrial fibrillation: Secondary | ICD-10-CM | POA: Diagnosis not present

## 2022-08-18 DIAGNOSIS — I428 Other cardiomyopathies: Secondary | ICD-10-CM | POA: Diagnosis not present

## 2022-08-18 DIAGNOSIS — I5022 Chronic systolic (congestive) heart failure: Secondary | ICD-10-CM | POA: Diagnosis not present

## 2022-08-18 LAB — CUP PACEART INCLINIC DEVICE CHECK
Date Time Interrogation Session: 20240716130828
Implantable Lead Connection Status: 753985
Implantable Lead Connection Status: 753985
Implantable Lead Connection Status: 753985
Implantable Lead Implant Date: 20150121
Implantable Lead Implant Date: 20150121
Implantable Lead Implant Date: 20150121
Implantable Lead Location: 753858
Implantable Lead Location: 753859
Implantable Lead Location: 753860
Implantable Lead Model: 4298
Implantable Lead Model: 5076
Implantable Pulse Generator Implant Date: 20200717

## 2022-08-18 MED ORDER — FUROSEMIDE 20 MG PO TABS: 20.0000 mg | ORAL_TABLET | Freq: Every day | ORAL | 3 refills | Status: DC | PRN

## 2022-08-18 NOTE — Progress Notes (Signed)
  Electrophysiology Office Note:   ID:  Mandy, Peeks Jan 19, 1933, MRN [id number]  Primary Cardiologist: None Electrophysiologist: Sherryl Manges, MD      History of Present Illness:   Heather Smith is a 89 y.o. female with h/o NICM, chronic systolic CHF, LBBB, s/p CRT-D, PVCs, fatigue, and HTN seen today for routine electrophysiology followup.   Seen by Dr. Graciela Husbands 06/30/2022 -> Furosemide and Coreg increased (note unavailable).   Since last being seen in our clinic the patient reports doing about the same. Still has some SOB with moderate exertion. Most exertion she does is walking around the grocery store, otherwise not very active. Limiting sodium intake, Drinks < 2 L a day. She is not sure how she is taking spironolactone ; gets them in a bubble back from [facility].  she denies chest pain, palpitations, PND, orthopnea, nausea, vomiting, dizziness, syncope, weight gain, or early satiety.   Review of systems complete and found to be negative unless listed in HPI.   EP Information / Studies Reviewed:    EKG is ordered today. Personal review as below.  EKG Interpretation Date/Time:  Tuesday August 18 2022 12:29:34 EDT Ventricular Rate:  74 PR Interval:  148 QRS Duration:  196 QT Interval:  474 QTC Calculation: 526 R Axis:   126  Text Interpretation: AV dual-paced rhythm When compared with ECG of 06-May-2022 06:34, No significant change was found Confirmed by Maxine Glenn [id number]) on 08/18/2022 12:36:20 PM    ICD Interrogation-  reviewed in detail today,  See PACEART report.  Device History: MDT CRTD implanted 2015 for CHF, gen change 2020 History of appropriate therapy: No History of AAD therapy: No  Physical Exam:   VS:  BP 126/86   Pulse 74   Ht 5\' 7"  (1.702 m)   Wt 186 lb (84.4 kg)   SpO2 92%   BMI 29.13 kg/m    Wt Readings from Last 3 Encounters:  08/18/22 186 lb (84.4 kg)  06/30/22 188 lb 6.4 oz (85.5 kg)  05/05/22 189 lb 9.5 oz (86 kg)     GEN: Well  nourished, well developed in no acute distress NECK: No JVD; No carotid bruits CARDIAC: Regular rate and rhythm, no murmurs, rubs, gallops RESPIRATORY:  Clear to auscultation without rales, wheezing or rhonchi  ABDOMEN: Soft, non-tender, non-distended EXTREMITIES:  No edema; No deformity   ASSESSMENT AND PLAN:    Chronic systolic dysfunction s/p Medtronic CRT-D  Stable on an appropriate medical regimen Normal ICD function See Pace Art report No changes today LV threshold stable @ 1.75 V @ 1.0 ms today. Effective pacing is >97% Volume status slightly up on Optivol and still having some SOB Labs today to help figure out spironolactone dosing. Continue lasix 20 mg twice weekly with additional as needed for weight gain of 3 lbs overnight or 5 lbs within one week.   PVCs EKG today shows NSR with stable intervals and no PVCs today. Continue flecainide Continue coreg  Disposition:   Follow up with Dr. Graciela Husbands in 6 months  Signed, Graciella Freer, PA-C

## 2022-08-18 NOTE — Patient Instructions (Signed)
Medication Instructions:  We sent in a new prescription for furosemide (Lasix) 20 mg for you to take as needed for weight gain of 3 lbs or more overnight or 5 lbs or more in one week. *If you need a refill on your cardiac medications before your next appointment, please call your pharmacy*  Lab Work: BMET-TODAY If you have labs (blood work) drawn today and your tests are completely normal, you will receive your results only by: MyChart Message (if you have MyChart) OR A paper copy in the mail If you have any lab test that is abnormal or we need to change your treatment, we will call you to review the results.  Follow-Up: At [facility], you and your health needs are our priority.  As part of our continuing mission to provide you with exceptional heart care, we have created designated Provider Care Teams.  These Care Teams include your primary Cardiologist (physician) and Advanced Practice Providers (APPs -  Physician Assistants and Nurse Practitioners) who all work together to provide you with the care you need, when you need it.  Your next appointment:   6 month(s)  Provider:   Sherryl Manges, MD

## 2022-08-19 ENCOUNTER — Other Ambulatory Visit: Payer: Self-pay | Admitting: *Deleted

## 2022-08-19 LAB — BASIC METABOLIC PANEL
BUN/Creatinine Ratio: 18 (ref 12–28)
BUN: 22 mg/dL (ref 8–27)
CO2: 28 mmol/L (ref 20–29)
Calcium: 10 mg/dL (ref 8.7–10.3)
Chloride: 99 mmol/L (ref 96–106)
Creatinine, Ser: 1.24 mg/dL — ABNORMAL HIGH (ref 0.57–1.00)
Glucose: 129 mg/dL — ABNORMAL HIGH (ref 70–99)
Potassium: 4.8 mmol/L (ref 3.5–5.2)
Sodium: 141 mmol/L (ref 134–144)
eGFR: 42 mL/min/{1.73_m2} — ABNORMAL LOW (ref >59)

## 2022-08-19 MED ORDER — SPIRONOLACTONE 25 MG PO TABS: 12.5000 mg | ORAL_TABLET | Freq: Every day | ORAL | 3 refills | Status: DC

## 2022-09-12 ENCOUNTER — Other Ambulatory Visit (HOSPITAL_COMMUNITY): Payer: Self-pay | Admitting: Cardiology

## 2022-09-17 ENCOUNTER — Ambulatory Visit (INDEPENDENT_AMBULATORY_CARE_PROVIDER_SITE_OTHER): Payer: Medicare HMO

## 2022-09-17 DIAGNOSIS — I428 Other cardiomyopathies: Secondary | ICD-10-CM | POA: Diagnosis not present

## 2022-09-17 LAB — CUP PACEART REMOTE DEVICE CHECK
Battery Remaining Longevity: 13 mo
Battery Voltage: 2.89 V
Brady Statistic AP VP Percent: 36.32 %
Brady Statistic AP VS Percent: 0.3 %
Brady Statistic AS VP Percent: 62.46 %
Brady Statistic AS VS Percent: 0.91 %
Brady Statistic RA Percent Paced: 36.56 %
Brady Statistic RV Percent Paced: 98.44 %
Date Time Interrogation Session: 20240815073626
HighPow Impedance: 66 Ohm
Implantable Lead Connection Status: 753985
Implantable Lead Connection Status: 753985
Implantable Lead Connection Status: 753985
Implantable Lead Implant Date: 20150121
Implantable Lead Implant Date: 20150121
Implantable Lead Implant Date: 20150121
Implantable Lead Location: 753858
Implantable Lead Location: 753859
Implantable Lead Location: 753860
Implantable Lead Model: 4298
Implantable Lead Model: 5076
Implantable Pulse Generator Implant Date: 20200717
Lead Channel Impedance Value: 184.154
Lead Channel Impedance Value: 184.154
Lead Channel Impedance Value: 188.1 Ohm
Lead Channel Impedance Value: 199.5 Ohm
Lead Channel Impedance Value: 204.14 Ohm
Lead Channel Impedance Value: 342 Ohm
Lead Channel Impedance Value: 399 Ohm
Lead Channel Impedance Value: 399 Ohm
Lead Channel Impedance Value: 399 Ohm
Lead Channel Impedance Value: 418 Ohm
Lead Channel Impedance Value: 475 Ohm
Lead Channel Impedance Value: 532 Ohm
Lead Channel Impedance Value: 551 Ohm
Lead Channel Impedance Value: 608 Ohm
Lead Channel Impedance Value: 646 Ohm
Lead Channel Impedance Value: 665 Ohm
Lead Channel Impedance Value: 665 Ohm
Lead Channel Impedance Value: 703 Ohm
Lead Channel Pacing Threshold Amplitude: 0.625 V
Lead Channel Pacing Threshold Amplitude: 0.625 V
Lead Channel Pacing Threshold Amplitude: 2 V
Lead Channel Pacing Threshold Pulse Width: 0.4 ms
Lead Channel Pacing Threshold Pulse Width: 0.4 ms
Lead Channel Pacing Threshold Pulse Width: 1 ms
Lead Channel Sensing Intrinsic Amplitude: 15.875 mV
Lead Channel Sensing Intrinsic Amplitude: 15.875 mV
Lead Channel Sensing Intrinsic Amplitude: 2.625 mV
Lead Channel Sensing Intrinsic Amplitude: 2.625 mV
Lead Channel Setting Pacing Amplitude: 1.5 V
Lead Channel Setting Pacing Amplitude: 2 V
Lead Channel Setting Pacing Amplitude: 2.75 V
Lead Channel Setting Pacing Pulse Width: 0.4 ms
Lead Channel Setting Pacing Pulse Width: 1 ms
Lead Channel Setting Sensing Sensitivity: 0.3 mV
Zone Setting Status: 755011
Zone Setting Status: 755011

## 2022-09-29 NOTE — Progress Notes (Signed)
Remote ICD transmission.   

## 2022-10-12 DIAGNOSIS — M1711 Unilateral primary osteoarthritis, right knee: Secondary | ICD-10-CM | POA: Diagnosis not present

## 2022-10-14 DIAGNOSIS — H20011 Primary iridocyclitis, right eye: Secondary | ICD-10-CM | POA: Diagnosis not present

## 2022-10-14 DIAGNOSIS — H16041 Marginal corneal ulcer, right eye: Secondary | ICD-10-CM | POA: Diagnosis not present

## 2022-10-16 DIAGNOSIS — H16041 Marginal corneal ulcer, right eye: Secondary | ICD-10-CM | POA: Diagnosis not present

## 2022-10-16 DIAGNOSIS — H20011 Primary iridocyclitis, right eye: Secondary | ICD-10-CM | POA: Diagnosis not present

## 2022-10-29 DIAGNOSIS — R051 Acute cough: Secondary | ICD-10-CM | POA: Diagnosis not present

## 2022-10-29 DIAGNOSIS — R062 Wheezing: Secondary | ICD-10-CM | POA: Diagnosis not present

## 2022-10-29 DIAGNOSIS — J209 Acute bronchitis, unspecified: Secondary | ICD-10-CM | POA: Diagnosis not present

## 2022-11-10 DIAGNOSIS — H18413 Arcus senilis, bilateral: Secondary | ICD-10-CM | POA: Diagnosis not present

## 2022-11-10 DIAGNOSIS — H25013 Cortical age-related cataract, bilateral: Secondary | ICD-10-CM | POA: Diagnosis not present

## 2022-11-10 DIAGNOSIS — H25043 Posterior subcapsular polar age-related cataract, bilateral: Secondary | ICD-10-CM | POA: Diagnosis not present

## 2022-11-10 DIAGNOSIS — H2512 Age-related nuclear cataract, left eye: Secondary | ICD-10-CM | POA: Diagnosis not present

## 2022-11-10 DIAGNOSIS — H2513 Age-related nuclear cataract, bilateral: Secondary | ICD-10-CM | POA: Diagnosis not present

## 2022-11-23 DIAGNOSIS — H2512 Age-related nuclear cataract, left eye: Secondary | ICD-10-CM | POA: Diagnosis not present

## 2022-11-24 DIAGNOSIS — H2511 Age-related nuclear cataract, right eye: Secondary | ICD-10-CM | POA: Diagnosis not present

## 2022-11-26 ENCOUNTER — Other Ambulatory Visit (HOSPITAL_COMMUNITY): Payer: Self-pay | Admitting: Cardiology

## 2022-12-07 DIAGNOSIS — H2511 Age-related nuclear cataract, right eye: Secondary | ICD-10-CM | POA: Diagnosis not present

## 2022-12-09 ENCOUNTER — Other Ambulatory Visit (HOSPITAL_COMMUNITY): Payer: Self-pay | Admitting: Family Medicine

## 2022-12-17 ENCOUNTER — Ambulatory Visit (INDEPENDENT_AMBULATORY_CARE_PROVIDER_SITE_OTHER): Payer: Medicare HMO

## 2022-12-17 DIAGNOSIS — I428 Other cardiomyopathies: Secondary | ICD-10-CM

## 2022-12-17 DIAGNOSIS — N1831 Chronic kidney disease, stage 3a: Secondary | ICD-10-CM | POA: Diagnosis not present

## 2022-12-17 DIAGNOSIS — E782 Mixed hyperlipidemia: Secondary | ICD-10-CM | POA: Diagnosis not present

## 2022-12-17 DIAGNOSIS — Z1331 Encounter for screening for depression: Secondary | ICD-10-CM | POA: Diagnosis not present

## 2022-12-17 DIAGNOSIS — Z23 Encounter for immunization: Secondary | ICD-10-CM | POA: Diagnosis not present

## 2022-12-17 DIAGNOSIS — I5022 Chronic systolic (congestive) heart failure: Secondary | ICD-10-CM | POA: Diagnosis not present

## 2022-12-17 DIAGNOSIS — Z0000 Encounter for general adult medical examination without abnormal findings: Secondary | ICD-10-CM | POA: Diagnosis not present

## 2022-12-17 DIAGNOSIS — Z95810 Presence of automatic (implantable) cardiac defibrillator: Secondary | ICD-10-CM | POA: Diagnosis not present

## 2022-12-17 DIAGNOSIS — G6289 Other specified polyneuropathies: Secondary | ICD-10-CM | POA: Diagnosis not present

## 2022-12-17 DIAGNOSIS — R7303 Prediabetes: Secondary | ICD-10-CM | POA: Diagnosis not present

## 2022-12-17 DIAGNOSIS — I130 Hypertensive heart and chronic kidney disease with heart failure and stage 1 through stage 4 chronic kidney disease, or unspecified chronic kidney disease: Secondary | ICD-10-CM | POA: Diagnosis not present

## 2022-12-17 DIAGNOSIS — G4700 Insomnia, unspecified: Secondary | ICD-10-CM | POA: Diagnosis not present

## 2022-12-17 DIAGNOSIS — E039 Hypothyroidism, unspecified: Secondary | ICD-10-CM | POA: Diagnosis not present

## 2022-12-17 LAB — CUP PACEART REMOTE DEVICE CHECK
Battery Remaining Longevity: 11 mo
Battery Voltage: 2.88 V
Brady Statistic AP VP Percent: 33.7 %
Brady Statistic AP VS Percent: 0.45 %
Brady Statistic AS VP Percent: 64.76 %
Brady Statistic AS VS Percent: 1.1 %
Brady Statistic RA Percent Paced: 33.99 %
Brady Statistic RV Percent Paced: 97.89 %
Date Time Interrogation Session: 20241114042205
HighPow Impedance: 68 Ohm
Implantable Lead Connection Status: 753985
Implantable Lead Connection Status: 753985
Implantable Lead Connection Status: 753985
Implantable Lead Implant Date: 20150121
Implantable Lead Implant Date: 20150121
Implantable Lead Implant Date: 20150121
Implantable Lead Location: 753858
Implantable Lead Location: 753859
Implantable Lead Location: 753860
Implantable Lead Model: 4298
Implantable Lead Model: 5076
Implantable Pulse Generator Implant Date: 20200717
Lead Channel Impedance Value: 171 Ohm
Lead Channel Impedance Value: 171 Ohm
Lead Channel Impedance Value: 175.622
Lead Channel Impedance Value: 175.622
Lead Channel Impedance Value: 175.622
Lead Channel Impedance Value: 342 Ohm
Lead Channel Impedance Value: 342 Ohm
Lead Channel Impedance Value: 342 Ohm
Lead Channel Impedance Value: 361 Ohm
Lead Channel Impedance Value: 399 Ohm
Lead Channel Impedance Value: 418 Ohm
Lead Channel Impedance Value: 456 Ohm
Lead Channel Impedance Value: 532 Ohm
Lead Channel Impedance Value: 532 Ohm
Lead Channel Impedance Value: 551 Ohm
Lead Channel Impedance Value: 551 Ohm
Lead Channel Impedance Value: 589 Ohm
Lead Channel Impedance Value: 589 Ohm
Lead Channel Pacing Threshold Amplitude: 0.5 V
Lead Channel Pacing Threshold Amplitude: 0.75 V
Lead Channel Pacing Threshold Amplitude: 2.375 V
Lead Channel Pacing Threshold Pulse Width: 0.4 ms
Lead Channel Pacing Threshold Pulse Width: 0.4 ms
Lead Channel Pacing Threshold Pulse Width: 1 ms
Lead Channel Sensing Intrinsic Amplitude: 14.25 mV
Lead Channel Sensing Intrinsic Amplitude: 14.25 mV
Lead Channel Sensing Intrinsic Amplitude: 3.25 mV
Lead Channel Sensing Intrinsic Amplitude: 3.25 mV
Lead Channel Setting Pacing Amplitude: 1.5 V
Lead Channel Setting Pacing Amplitude: 2 V
Lead Channel Setting Pacing Amplitude: 2.75 V
Lead Channel Setting Pacing Pulse Width: 0.4 ms
Lead Channel Setting Pacing Pulse Width: 1 ms
Lead Channel Setting Sensing Sensitivity: 0.3 mV
Zone Setting Status: 755011
Zone Setting Status: 755011

## 2022-12-25 ENCOUNTER — Other Ambulatory Visit (HOSPITAL_COMMUNITY): Payer: Self-pay | Admitting: Internal Medicine

## 2022-12-30 NOTE — Progress Notes (Signed)
Remote ICD transmission.   

## 2023-01-29 DIAGNOSIS — M25552 Pain in left hip: Secondary | ICD-10-CM | POA: Diagnosis not present

## 2023-01-29 DIAGNOSIS — M545 Low back pain, unspecified: Secondary | ICD-10-CM | POA: Diagnosis not present

## 2023-02-05 DIAGNOSIS — M25552 Pain in left hip: Secondary | ICD-10-CM | POA: Diagnosis not present

## 2023-02-15 ENCOUNTER — Ambulatory Visit: Payer: Medicare HMO | Attending: Internal Medicine | Admitting: Internal Medicine

## 2023-02-15 ENCOUNTER — Encounter: Payer: Self-pay | Admitting: Internal Medicine

## 2023-02-15 VITALS — BP 138/69 | HR 70 | Ht 67.0 in | Wt 184.6 lb

## 2023-02-15 DIAGNOSIS — I493 Ventricular premature depolarization: Secondary | ICD-10-CM

## 2023-02-15 DIAGNOSIS — D649 Anemia, unspecified: Secondary | ICD-10-CM | POA: Diagnosis not present

## 2023-02-15 DIAGNOSIS — I480 Paroxysmal atrial fibrillation: Secondary | ICD-10-CM

## 2023-02-15 DIAGNOSIS — I5022 Chronic systolic (congestive) heart failure: Secondary | ICD-10-CM

## 2023-02-15 DIAGNOSIS — Z95810 Presence of automatic (implantable) cardiac defibrillator: Secondary | ICD-10-CM | POA: Diagnosis not present

## 2023-02-15 DIAGNOSIS — I428 Other cardiomyopathies: Secondary | ICD-10-CM | POA: Diagnosis not present

## 2023-02-15 NOTE — Progress Notes (Signed)
 5//      Patient Care Team: Arloa Elsie SAUNDERS, MD as PCP - General Fernande Elspeth BROCKS, MD as PCP - Electrophysiology (Cardiology)   HPI  Heather Smith is a 89 y.o. female Seen in followup for CRT implantation for nonischemic cardiomyopathy and depressed LV function. This was accomplished fall 2014  change out 2020  Interval partial lead dislodgment  We reprogrammed the device    I reviewed with her the data from her CPX 9/16 showed excellent functional capacity and her echo from last week demonstrated normal LV function  DATE TEST EF   12/14 Echo  20-25%   1/17    Echo   55-62 %   1/18    Echo  55-60 %   11/19 MYOVIEW  75% NO ischemia   1/20 Echo  50-55% 2 MR jets-overall mild  12/22 Echo   55-60%   11/23 Echo   60-65%      Her husband died 10-Jul-2023; from melanoma diagnosed to death time about 5 weeks they have been married almost 69 years; she met him when she was 46.  Interval total knee  The patient denies chest pain, nocturnal dyspnea, orthopnea or peripheral edema.  There have been no palpitations, lightheadedness or syncope.  Complains of fatigue and chronic but stable dyspnea.   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   Cardiac resynchronization therapy defibrillator (CRT-D) in place    Chronic kidney disease, stage 3a (HCC)    Chronic systolic CHF (congestive heart failure) (HCC)    Complication of anesthesia    difficult to wake up and blood pressure drops   Depression    Dyspnea    GERD (gastroesophageal reflux disease)    Hypercholesterolemia    Hypertension    Hypothyroidism    Left bundle branch block    LV lead partial dislodgment 05/30/2013   Neurogenic  claudication due to lumbar spinal stenosis    Neuropathy    Non-ischemic cardiomyopathy (HCC)    LHC 10/04/12: Normal coronary arteries, EF 25-30% with global HK.  Echocardiogram 10/05/12: EF 30-35%, diffuse HK, moderate LAE. Echocardiogram (01/2013): Lateral to septal wall dyssynchrony EF 20-25%, diffuse HK   Osteoarthritis    about all over (02/22/2013)   Pre-diabetes    Presence of permanent cardiac pacemaker    PVC's (premature ventricular contractions)    Spinal stenosis    Vitamin D  deficiency     Past Surgical History:  Procedure Laterality Date   APPENDECTOMY     BI-VENTRICULAR IMPLANTABLE CARDIOVERTER DEFIBRILLATOR N/A 02/22/2013   Procedure: BI-VENTRICULAR IMPLANTABLE CARDIOVERTER DEFIBRILLATOR  (CRT-D);  Surgeon: Elspeth BROCKS Fernande, MD;  Location: [facility] CATH LAB;  Service: Cardiovascular;  Laterality: N/A;   BIV ICD GENERATOR CHANGEOUT N/A 08/19/2018   Procedure: BIV ICD GENERATOR CHANGEOUT;  Surgeon: Fernande Elspeth BROCKS, MD;  Location: [facility] INVASIVE CV LAB;  Service: Cardiovascular;  Laterality: N/A;   BLEPHAROPLASTY Bilateral    EXCISIONAL HEMORRHOIDECTOMY     FOOT ARTHRODESIS, MODIFIED MCBRIDE Bilateral    KNEE ARTHROSCOPY Right  LEFT HEART CATHETERIZATION WITH CORONARY ANGIOGRAM N/A 10/04/2012   Procedure: LEFT HEART CATHETERIZATION WITH CORONARY ANGIOGRAM;  Surgeon: Peter M Jordan, MD;  Location: [facility] CATH LAB;  Service: Cardiovascular;  Laterality: N/A;   LUMBAR LAMINECTOMY  11/03/2006    Bilateral 3, 4, 5 laminectomy, partial L2, decompression of the thecal sac, foraminotomy, posterolateral arthrodesis L3-S1 with autograft   -- SURGEON:  Catalina Stains, M.D.    TONSILLECTOMY     TOTAL KNEE ARTHROPLASTY Left 07/15/2015   Procedure: LEFT TOTAL KNEE ARTHROPLASTY;  Surgeon: Dempsey Sensor, MD;  Location: [facility] OR;  Service: Orthopedics;  Laterality: Left;   TOTAL KNEE ARTHROPLASTY Right 05/05/2022   Procedure: TOTAL KNEE ARTHROPLASTY;  Surgeon: Josefina Chew, MD;  Location: [facility] ORS;  Service:  Orthopedics;  Laterality: Right;   TUBAL LIGATION     VAGINAL HYSTERECTOMY      Current Outpatient Medications  Medication Sig Dispense Refill   acetaminophen  (TYLENOL ) 325 MG tablet Take 1-2 tablets (325-650 mg total) by mouth every 4 (four) hours as needed for moderate pain, headache or mild pain. 60 tablet 0   carvedilol  (COREG ) 6.25 MG tablet Take 1 tablet (6.25 mg total) by mouth 2 (two) times daily with a meal. 180 tablet 1   Cholecalciferol  (VITAMIN D -3) 25 MCG (1000 UT) CAPS Take 1,000 Units by mouth daily.     diclofenac Sodium (VOLTAREN) 1 % GEL Apply 1 Application topically at bedtime.     DULoxetine  (CYMBALTA ) 60 MG capsule Take 60 mg by mouth daily.     ezetimibe  (ZETIA ) 10 MG tablet Take 1 tablet by mouth once daily 90 tablet 3   flecainide  (TAMBOCOR ) 50 MG tablet TAKE ONE TABLET BY MOUTH EVERY MORNING and TAKE ONE TABLET BY MOUTH EVERYDAY AT BEDTIME 180 tablet 3   furosemide  (LASIX ) 20 MG tablet Take 1 tablet (20 mg total) by mouth daily as needed (for weight gain of 3 lbs or more overnight or 5 lbs or more in a week). 30 tablet 3   furosemide  (LASIX ) 20 MG tablet TAKE 1 TABLET BY MOUTH DAILY AS NEEDED FOR FLUID *NO MORE THAN TWICE PER WEEK* 8 tablet 10   levothyroxine  (SYNTHROID , LEVOTHROID) 50 MCG tablet Take 50 mcg by mouth daily before breakfast.     lidocaine  (XYLOCAINE ) 5 % ointment Apply 1 Application topically as needed (for neuropathy pain). 35.44 g 0   losartan  (COZAAR ) 25 MG tablet Take 1 tablet (25 mg total) by mouth daily. NEEDS FOLLOW UP APPOINTMENT FOR ANYMORE REFILLS 90 tablet 0   Magnesium  250 MG TABS Take 250 mg by mouth daily.     Multiple Vitamins-Minerals (MULTIVITAMIN WITH MINERALS) tablet Take 1 tablet by mouth daily.     OVER THE COUNTER MEDICATION Take 2 each by mouth daily. Circulation Sweets Gummies     pantoprazole  (PROTONIX ) 40 MG tablet Take 40 mg by mouth 2 (two) times daily.     sennosides-docusate sodium  (SENOKOT-S) 8.6-50 MG tablet Take 2  tablets by mouth daily. 30 tablet 1   spironolactone  (ALDACTONE ) 25 MG tablet Take 0.5 tablets (12.5 mg total) by mouth daily. 45 tablet 3   traMADol  (ULTRAM ) 50 MG tablet Take 1 tablet (50 mg total) by mouth every 4 (four) hours as needed for severe pain. 30 tablet 0   traZODone  (DESYREL ) 50 MG tablet Take 50 mg by mouth at bedtime.     vitamin B-12 (CYANOCOBALAMIN ) 1000 MCG tablet Take 1,000 mcg by mouth daily.     zinc  gluconate 50 MG tablet Take  50 mg by mouth daily.     No current facility-administered medications for this visit.    Allergies  Allergen Reactions   Statins Other (See Comments)    Makes her bones hurt and very sore   Niaspan [Niacin Er (Antihyperlipidemic)] Other (See Comments)    High doses, causes hot flashes & aching    Hydrocodone  Itching    States she's had hallucinations on hydrocodone  in the past   Simvastatin Other (See Comments)    Muscle aches    Benadryl  [Diphenhydramine ] Other (See Comments)    Makes pt feel jittery    Dilaudid  [Hydromorphone  Hcl] Itching    Review of Systems negative except from HPI and PMH  Physical Exam BP 138/69   Pulse 70   Ht 5' 7 (1.702 m)   Wt 184 lb 9.6 oz (83.7 kg)   SpO2 95%   BMI 28.91 kg/m  Well developed and well nourished in no acute distress HENT normal Neck supple with JVP-flat Clear Device pocket well healed; without hematoma or erythema.  There is no tethering  Regular rate and rhythm, no  murmur Abd-soft with active BS No Clubbing cyanosis  edema Skin-warm and dry A & Oriented  Grossly normal sensory and motor function  ECG AV pacing with an upright QRS lead I and upright QRS lead V1  Device function is normal. Programming changes none  See Paceart for details    Assessment and  Plan  Nonischemic cardiomyopathy-- interval normalization  Congestive heart failure-chronic-diastolic    Medtronic ICD-CRT  T   Atrial fibrillation-paroxysmal  LV threshold change  Chronotropic  incompetence  Depression   PVCs  Chest pain  Functional status is stable  Not on SGLT2  ( has seen CHF) not sure why   No VT   No AF  PVC quiescent  continue flec 50 bid       Mood is stable.

## 2023-02-15 NOTE — Patient Instructions (Signed)
 Medication Instructions:  Your physician recommends that you continue on your current medications as directed. Please refer to the Current Medication list given to you today.  *If you need a refill on your cardiac medications before your next appointment, please call your pharmacy*   Lab Work: Today: CBC  If you have labs (blood work) drawn today and your tests are completely normal, you will receive your results only by: MyChart Message (if you have MyChart) OR A paper copy in the mail If you have any lab test that is abnormal or we need to change your treatment, we will call you to review the results.   Testing/Procedures: None ordered   Follow-Up: At [facility], you and your health needs are our priority.  As part of our continuing mission to provide you with exceptional heart care, we have created designated Provider Care Teams.  These Care Teams include your primary Cardiologist (physician) and Advanced Practice Providers (APPs -  Physician Assistants and Nurse Practitioners) who all work together to provide you with the care you need, when you need it.  Your next appointment:   1 year(s)  The format for your next appointment:   In Person  Provider:   You will see one of the following Advanced Practice Providers on your designated Care Team:   Charlies Arthur, PA-C Michael Andy Tillery, PA-C Suzann Riddle, NP Daphne Barrack, NP   Thank you for choosing [facility]!!   [phone]

## 2023-02-16 LAB — CBC
Hematocrit: 41.9 % (ref 34.0–46.6)
Hemoglobin: 14 g/dL (ref 11.1–15.9)
MCH: 30.9 pg (ref 26.6–33.0)
MCHC: 33.4 g/dL (ref 31.5–35.7)
MCV: 93 fL (ref 79–97)
Platelets: 176 10*3/uL (ref 150–450)
RBC: 4.53 x10E6/uL (ref 3.77–5.28)
RDW: 12.9 % (ref 11.7–15.4)
WBC: 8.7 10*3/uL (ref 3.4–10.8)

## 2023-02-28 LAB — CUP PACEART INCLINIC DEVICE CHECK
Date Time Interrogation Session: 20250113153529
Implantable Lead Connection Status: 753985
Implantable Lead Connection Status: 753985
Implantable Lead Connection Status: 753985
Implantable Lead Implant Date: 20150121
Implantable Lead Implant Date: 20150121
Implantable Lead Implant Date: 20150121
Implantable Lead Location: 753858
Implantable Lead Location: 753859
Implantable Lead Location: 753860
Implantable Lead Model: 4298
Implantable Lead Model: 5076
Implantable Pulse Generator Implant Date: 20200717

## 2023-03-05 ENCOUNTER — Other Ambulatory Visit (HOSPITAL_COMMUNITY): Payer: Self-pay | Admitting: Family Medicine

## 2023-03-18 ENCOUNTER — Ambulatory Visit (INDEPENDENT_AMBULATORY_CARE_PROVIDER_SITE_OTHER): Payer: Medicare HMO

## 2023-03-18 DIAGNOSIS — I428 Other cardiomyopathies: Secondary | ICD-10-CM

## 2023-03-20 LAB — CUP PACEART REMOTE DEVICE CHECK
Battery Remaining Longevity: 8 mo
Battery Voltage: 2.86 V
Brady Statistic AP VP Percent: 40.4 %
Brady Statistic AP VS Percent: 0.66 %
Brady Statistic AS VP Percent: 57.91 %
Brady Statistic AS VS Percent: 1.04 %
Brady Statistic RA Percent Paced: 40.76 %
Brady Statistic RV Percent Paced: 97.49 %
Date Time Interrogation Session: 20250213072404
HighPow Impedance: 70 Ohm
Implantable Lead Connection Status: 753985
Implantable Lead Connection Status: 753985
Implantable Lead Connection Status: 753985
Implantable Lead Implant Date: 20150121
Implantable Lead Implant Date: 20150121
Implantable Lead Implant Date: 20150121
Implantable Lead Location: 753858
Implantable Lead Location: 753859
Implantable Lead Location: 753860
Implantable Lead Model: 4298
Implantable Lead Model: 5076
Implantable Pulse Generator Implant Date: 20200717
Lead Channel Impedance Value: 184.154
Lead Channel Impedance Value: 184.154
Lead Channel Impedance Value: 188.1 Ohm
Lead Channel Impedance Value: 199.5 Ohm
Lead Channel Impedance Value: 204.14 Ohm
Lead Channel Impedance Value: 342 Ohm
Lead Channel Impedance Value: 399 Ohm
Lead Channel Impedance Value: 399 Ohm
Lead Channel Impedance Value: 418 Ohm
Lead Channel Impedance Value: 418 Ohm
Lead Channel Impedance Value: 456 Ohm
Lead Channel Impedance Value: 513 Ohm
Lead Channel Impedance Value: 513 Ohm
Lead Channel Impedance Value: 589 Ohm
Lead Channel Impedance Value: 589 Ohm
Lead Channel Impedance Value: 608 Ohm
Lead Channel Impedance Value: 646 Ohm
Lead Channel Impedance Value: 703 Ohm
Lead Channel Pacing Threshold Amplitude: 0.5 V
Lead Channel Pacing Threshold Amplitude: 0.625 V
Lead Channel Pacing Threshold Amplitude: 1.375 V
Lead Channel Pacing Threshold Pulse Width: 0.4 ms
Lead Channel Pacing Threshold Pulse Width: 0.4 ms
Lead Channel Pacing Threshold Pulse Width: 1 ms
Lead Channel Sensing Intrinsic Amplitude: 15.875 mV
Lead Channel Sensing Intrinsic Amplitude: 15.875 mV
Lead Channel Sensing Intrinsic Amplitude: 2.625 mV
Lead Channel Sensing Intrinsic Amplitude: 2.625 mV
Lead Channel Setting Pacing Amplitude: 1.5 V
Lead Channel Setting Pacing Amplitude: 2 V
Lead Channel Setting Pacing Amplitude: 2.75 V
Lead Channel Setting Pacing Pulse Width: 0.4 ms
Lead Channel Setting Pacing Pulse Width: 1 ms
Lead Channel Setting Sensing Sensitivity: 0.3 mV
Zone Setting Status: 755011
Zone Setting Status: 755011

## 2023-04-05 ENCOUNTER — Encounter: Payer: Self-pay | Admitting: Internal Medicine

## 2023-04-21 NOTE — Addendum Note (Signed)
 Addended by: Elease Etienne A on: 04/21/2023 09:30 AM   Modules accepted: Orders

## 2023-04-21 NOTE — Progress Notes (Signed)
 Remote ICD transmission.

## 2023-04-27 ENCOUNTER — Other Ambulatory Visit (HOSPITAL_COMMUNITY): Payer: Self-pay | Admitting: Cardiology

## 2023-04-27 ENCOUNTER — Other Ambulatory Visit (HOSPITAL_COMMUNITY): Payer: Self-pay | Admitting: Family Medicine

## 2023-05-03 ENCOUNTER — Other Ambulatory Visit (HOSPITAL_COMMUNITY): Payer: Self-pay | Admitting: Family Medicine

## 2023-05-26 ENCOUNTER — Other Ambulatory Visit (HOSPITAL_COMMUNITY): Payer: Self-pay | Admitting: Cardiology

## 2023-05-26 ENCOUNTER — Other Ambulatory Visit (HOSPITAL_COMMUNITY): Payer: Self-pay | Admitting: Family Medicine

## 2023-05-28 NOTE — Progress Notes (Signed)
 Patient ID: Heather Smith, female   DOB: 06-24-1932, 89 y.o.   MRN: [id number] PCP: Dr. Raquel Cables HF Cardiology: Mitzie Anda EP: Dr. Rodolfo Clan   89 y.o. with history of nonischemic cardiomyopathy and chronic systolic HF.  In 9/14, she was admitted to [facility] with chest pain.  Cardiac cath was done, showing no significant coronary disease.  However, echo showed EF 30-35%.  She was started on meds for CHF, and echo was repeated in 12/14, EF was read as 20-25%.  She had a Medtonic CRT-D device placed (had baseline LBBB).  In 3/15, the LV lead was not capturing properly so device had to be reprogrammed.  She had AV optimization in 5/15.  Limited echo at that time showed improvement in EF to 40-45%.  CPX in 5/15 was submaximal but suggested low normal to mildly reduced functional capacity. Echo in 6/16 showed EF 35-40% with diffuse hypokinesis.  CPX was done in 9/16.  This actually showed good functional capacity. She had repeat echo in 1/17, showing EF increased to 60-65%.  Most recent echo in 8/18 showed EF 50-55% with mild LV dilation and mild MR.    She was seen by Dr. Rodolfo Clan for frequent PVCs (symptomatic).  She was started on flecainide .  She thinks that this has helped .   Atypical chest pain in 11/19, Cardiolite  was normal.   Repeat echo in 1/20 showed EF 50-55%, moderate MR.    PYP scan in 1/21 was not suggestive of TTR amyloidosis.  Echo in 5/21 showed EF 50-55%, normal RV, mild MR.   She was admitted 12/22 with near syncope and AKI due to hypotension in setting of COVID-19 infection. SCr 1.5 on admit (baseline ~1.1). Also had brief episodes of atrial flutter, captured on device interrogation (longest duration 8 min). Head CT was negative. She was treated by general cardiology. Per documentation, there was no evidence of  acute HF decompensation/hypervolemia. Coreg , Entresto  and diuretics were initially held. Gentle IVF hydration administered. AKI resolved and BP improved. She had no further AFL/SVT.   Echo showed EF 55-60%. RV ok. No significant valvular dysfunction. HF meds were reintroduced prior to d/c, lasix  PRN. She completed antiviral Molpunavir for COVID. Scr 1.07 at discharge.   Follow up 10/23, continued with fatigue and poor energy since husband's passing in the Spring. Volume stable on device interrogation but CRT pacing < 90%. Repeat echo arranged and referred back to E for device optimization.  Echo 11/23 showed EF 60-65%, normal RV, moderate MR.  Last seen 12/2021.  S/p R TKA 4/24.  Today she returns for HF follow up with her daughter. Overall feeling fine. She has SOB walking up her lengthy driveway (this is up an incline). She had a mechanical fall this week, she did not hit her head or LOC. Balance is poor due to neuropathy. Denies palpitations, abnormal bleeding, CP, dizziness, edema, or PND/Orthopnea. Appetite ok. Weight at home 180-183 pounds. Taking all medications. Takes Lasix  about once a week. Daughter lives nearby.  ECG (personally reviewed): A-sensed V-paced   Medtronic device interrogation (personally reviewed):OptiVol ok, 0.9 hr/day activity, no AF, 96.5% BiV paced  Labs (10/23): K 4.7, creatinine 1.17, normal LFTs, LDL 74, HDL 38, TGs 214 Labs (7/24): K 4.9, creatinine 1.08  PMH: 1. Chronic systolic CHF: Nonischemic cardiomyopathy.  LHC (9/14) with no significant coronary disease, EF 25-30% with global hypokinesis.  Echo (9/14) with EF 30-35%, moderate LV dilation, diffuse hypokinesis.  Echo (12/14) with EF 20-25%.  She does not drink ETOH.  She had Medtronic CRT-D device implantation in 1/15. Echo (5/15) with EF 40-45%, mild LVH.  CPX (5/15) was submaximal with RER 0.99, peak VO2 14, VE/VCO2 37.3; low normal functional capacity may be limited by loss of BiV pacing at higher HR and by deconditioning but interpretation somewhat limited since study was submaximal.  Echo (6/16) with EF 35-40%, severe LV dilation, diffuse hypokinesis, mild MR.  CPX (9/16) with peak VO2  17.7, VE/VCO2 slope 32.3, RER 1.11 => excellent functional capacity. Echo (11/17) with EF 60-65%.  - Echo (8/18): EF 50-55%, mildly dilated LV, mild MR.  - Echo (1/20): EF 50-55%, moderate MR.  - PYP scan (1/21): H/CL 1.0, grade 1.  Not suggestive of TTR amyloidosis.  - Echo (5/21): EF 50-55%, normal RV, mild MR. - Ltd echo (12/22): EF 55-60% - Echo (11/23): EF 60-65%, normal RV, moderate MR 2. Back pain s/p lumbar laminectomy 3. OA: Knees. Left TKR in 6/17.  4. Hyperlipidemia 5. Depression 6. PVCs 7. Peripheral neuropathy: Uncertain etiology.  She is not a diabetic.  8. Lung nodule: Last CT chest in 1/19 showed stable 6 mm LLL nodule, no further followup recommended.  9. Chest pain: Cardiolite  (11/19) with EF 74%, no evidence for ischemia/infarction.   SH: Lives [place], married, never smoked, no ETOH.    FH: Mother with MI at 73, possible ischemic cardiomyopathy.  Father with cardiomyopathy, died at 6.  She has 5 brothers, none with significant coronary disease.    ROS: All systems reviewed and negative except as per HPI.   Current Outpatient Medications  Medication Sig Dispense Refill   acetaminophen  (TYLENOL ) 325 MG tablet Take 1-2 tablets (325-650 mg total) by mouth every 4 (four) hours as needed for moderate pain, headache or mild pain. 60 tablet 0   carvedilol  (COREG ) 6.25 MG tablet TAKE 1 TABLET BY MOUTH TWICE DAILY WITH MEALS *PATIENT NEEDS APPOINTMENT* 60 tablet 2   Cholecalciferol  (VITAMIN D -3) 25 MCG (1000 UT) CAPS Take 1,000 Units by mouth daily.     diclofenac Sodium (VOLTAREN) 1 % GEL Apply 1 Application topically at bedtime.     DULoxetine  (CYMBALTA ) 60 MG capsule Take 60 mg by mouth daily.     ezetimibe  (ZETIA ) 10 MG tablet Take 1 tablet by mouth once daily 90 tablet 3   flecainide  (TAMBOCOR ) 50 MG tablet TAKE 1 TABLET BY MOUTH TWICE DAILY *PATIENT NEEDS APPOINTMENT* 60 tablet 0   furosemide  (LASIX ) 20 MG tablet Take 1 tablet (20 mg total) by mouth daily as  needed (for weight gain of 3 lbs or more overnight or 5 lbs or more in a week). 30 tablet 3   furosemide  (LASIX ) 20 MG tablet TAKE 1 TABLET BY MOUTH DAILY AS NEEDED FOR FLUID *NO MORE THAN TWICE PER WEEK* 8 tablet 10   levothyroxine  (SYNTHROID , LEVOTHROID) 50 MCG tablet Take 50 mcg by mouth daily before breakfast.     lidocaine  (XYLOCAINE ) 5 % ointment Apply 1 Application topically as needed (for neuropathy pain). 35.44 g 0   losartan  (COZAAR ) 25 MG tablet TAKE 1 TABLET BY MOUTH ONCE DAILY . APPOINTMENT REQUIRED FOR FUTURE REFILLS 30 tablet 0   Magnesium  250 MG TABS Take 250 mg by mouth daily.     Multiple Vitamins-Minerals (MULTIVITAMIN WITH MINERALS) tablet Take 1 tablet by mouth daily.     OVER THE COUNTER MEDICATION Take 2 each by mouth daily. Circulation Sweets Gummies     pantoprazole  (PROTONIX ) 40 MG tablet Take 40 mg by mouth 2 (two) times  daily.     rosuvastatin  (CRESTOR ) 10 MG tablet Take 10 mg by mouth 3 (three) times a week.     sennosides-docusate sodium  (SENOKOT-S) 8.6-50 MG tablet Take 2 tablets by mouth daily. 30 tablet 1   spironolactone  (ALDACTONE ) 25 MG tablet Take 0.5 tablets (12.5 mg total) by mouth daily. 45 tablet 3   traMADol  (ULTRAM ) 50 MG tablet Take 50 mg by mouth daily as needed.     traZODone  (DESYREL ) 50 MG tablet Take 100 mg by mouth at bedtime.     vitamin B-12 (CYANOCOBALAMIN ) 1000 MCG tablet Take 1,000 mcg by mouth daily.     zinc  gluconate 50 MG tablet Take 50 mg by mouth daily.     No current facility-administered medications for this encounter.   BP 134/82   Pulse 61   Ht 5\' 7"  (1.702 m)   Wt 85.6 kg (188 lb 12.8 oz)   SpO2 96%   BMI 29.57 kg/m   Wt Readings from Last 3 Encounters:  06/01/23 85.6 kg (188 lb 12.8 oz)  02/15/23 83.7 kg (184 lb 9.6 oz)  08/18/22 84.4 kg (186 lb)   PHYSICAL EXAM: General:  NAD. No resp difficulty, walked into clinic, appears younger than stated age. HEENT: Normal Neck: Supple. No JVD. Cor: Regular rate & rhythm. No  rubs, gallops or murmurs. Lungs: Clear Abdomen: Soft, nontender, nondistended.  Extremities: No cyanosis, clubbing, rash, edema Neuro: Alert & oriented x 3, moves all 4 extremities w/o difficulty. Affect pleasant.  Assessment/Plan: 1. Chronic systolic CHF: Nonischemic cardiomyopathy s/p Medtronic CRT-D. EF 35-40% (6/16). She does have a family history of cardiomyopathy of uncertain etiology (father) but none of her siblings developed a cardiomyopathy.  Familial cardiomyopathy is a consideration.  Viral myocarditis is also a possible etiology.  Echo in 1/17 showed improvement in EF to 60-65%, echo in 8/18 showed EF 50-55% and echo in 1/20 showed stable EF 50-55%.  PYP scan not suggestive of TTR amyloidosis. Echo in 5/21 showed EF 50-55%, normal RV, mild MR. Echo 12/22 EF 55-60%, RV ok. Echo 11/23 showed EF 60-65%, normal RV, moderate MR. NYHA II. She is not volume overloaded on exam or by device.  - Increase spironolactone  to 25 mg daily. BMET/BNP today, repeat BMET in 1 week. - Continue losartan  25 mg daily. - Continue Coreg  6.25 mg bid . - Continue Lasix  20 mg PRN.  - Update echo next visit. 2. PVCs: Followed by EP, now on flecainide . Minimal palpitations now.  - Continue flecainide  50 mg bid.  Will need nodal blocking agent while on flecainide , she is on Coreg .  3. Peripheral neuropathy: Bothersome peripheral neuropathy. PYP scan not suggestive of TTR amyloidosis.                    4. Fatigue: Chronic, has been present for years.  5. HTN: BP stable today. - Med changes as above. 6. PAD: Abnormal ABIs on insurance screening.  She has peripheral neuropathy symptoms but no definite claudication. LE arterial dopplers 10/22 w/ normal ABIs  7. HLD: LDL 74 on 10/23.  - Continue Zetia  10 mg daily (cannot tolerate statins). Check lipids today. 8. AF/Atrial Flutter: No further AT/AF detected on today's interrogation. No indication for a/c.  - Device followed in EP device clinic.   Follow up in 4  months with Dr. Mitzie Anda + echo.  Arlice [place], FNP-BC 06/01/2023

## 2023-05-31 ENCOUNTER — Telehealth (HOSPITAL_COMMUNITY): Payer: Self-pay

## 2023-05-31 DIAGNOSIS — H43813 Vitreous degeneration, bilateral: Secondary | ICD-10-CM | POA: Diagnosis not present

## 2023-05-31 NOTE — Telephone Encounter (Signed)
 Called to confirm/remind patient of their appointment at the [facility] on 06/01/2023 2:30.   Appointment:   [x] Confirmed  [] Left mess   [] No answer/No voice mail  [] VM Full/unable to leave message  [] Phone not in service  Patient reminded to bring all medications and/or complete list.  Confirmed patient has transportation. Gave directions, instructed to utilize valet parking.

## 2023-06-01 ENCOUNTER — Ambulatory Visit (HOSPITAL_COMMUNITY)
Admission: RE | Admit: 2023-06-01 | Discharge: 2023-06-01 | Disposition: A | Discharge status: Home / Self Care | Source: Ambulatory Visit | Attending: Family Medicine | Admitting: Family Medicine

## 2023-06-01 ENCOUNTER — Encounter (HOSPITAL_COMMUNITY): Payer: Self-pay

## 2023-06-01 VITALS — BP 134/82 | HR 61 | Ht 67.0 in | Wt 188.8 lb

## 2023-06-01 DIAGNOSIS — R5382 Chronic fatigue, unspecified: Secondary | ICD-10-CM | POA: Insufficient documentation

## 2023-06-01 DIAGNOSIS — E785 Hyperlipidemia, unspecified: Secondary | ICD-10-CM

## 2023-06-01 DIAGNOSIS — I739 Peripheral vascular disease, unspecified: Secondary | ICD-10-CM

## 2023-06-01 DIAGNOSIS — I447 Left bundle-branch block, unspecified: Secondary | ICD-10-CM | POA: Diagnosis not present

## 2023-06-01 DIAGNOSIS — I493 Ventricular premature depolarization: Secondary | ICD-10-CM

## 2023-06-01 DIAGNOSIS — I10 Essential (primary) hypertension: Secondary | ICD-10-CM | POA: Diagnosis not present

## 2023-06-01 DIAGNOSIS — Z79899 Other long term (current) drug therapy: Secondary | ICD-10-CM | POA: Insufficient documentation

## 2023-06-01 DIAGNOSIS — G629 Polyneuropathy, unspecified: Secondary | ICD-10-CM | POA: Diagnosis not present

## 2023-06-01 DIAGNOSIS — I5022 Chronic systolic (congestive) heart failure: Secondary | ICD-10-CM | POA: Diagnosis not present

## 2023-06-01 DIAGNOSIS — I110 Hypertensive heart disease with heart failure: Secondary | ICD-10-CM | POA: Diagnosis not present

## 2023-06-01 DIAGNOSIS — I428 Other cardiomyopathies: Secondary | ICD-10-CM | POA: Insufficient documentation

## 2023-06-01 DIAGNOSIS — Z95810 Presence of automatic (implantable) cardiac defibrillator: Secondary | ICD-10-CM | POA: Insufficient documentation

## 2023-06-01 DIAGNOSIS — I5021 Acute systolic (congestive) heart failure: Secondary | ICD-10-CM | POA: Diagnosis not present

## 2023-06-01 LAB — BRAIN NATRIURETIC PEPTIDE: B Natriuretic Peptide: 22.2 pg/mL (ref 0.0–100.0)

## 2023-06-01 LAB — BASIC METABOLIC PANEL WITH GFR
Anion gap: 10 (ref 5–15)
BUN: 23 mg/dL (ref 8–23)
CO2: 30 mmol/L (ref 22–32)
Calcium: 9.8 mg/dL (ref 8.9–10.3)
Chloride: 101 mmol/L (ref 98–111)
Creatinine, Ser: 1.17 mg/dL — ABNORMAL HIGH (ref 0.44–1.00)
GFR, Estimated: 45 mL/min — ABNORMAL LOW (ref >60)
Glucose, Bld: 108 mg/dL — ABNORMAL HIGH (ref 70–99)
Potassium: 5.1 mmol/L (ref 3.5–5.1)
Sodium: 141 mmol/L (ref 135–145)

## 2023-06-01 LAB — LIPID PANEL
Cholesterol: 160 mg/dL (ref 0–200)
HDL: 47 mg/dL (ref >40)
LDL Cholesterol: 71 mg/dL (ref 0–99)
Total CHOL/HDL Ratio: 3.4 ratio
Triglycerides: 211 mg/dL — ABNORMAL HIGH (ref <150)
VLDL: 42 mg/dL — ABNORMAL HIGH (ref 0–40)

## 2023-06-01 MED ORDER — SPIRONOLACTONE 25 MG PO TABS: 25.0000 mg | ORAL_TABLET | Freq: Every day | ORAL | 3 refills | Status: AC

## 2023-06-01 MED ORDER — SPIRONOLACTONE 25 MG PO TABS: 25.0000 mg | ORAL_TABLET | Freq: Every day | ORAL | 3 refills | Status: DC

## 2023-06-01 NOTE — Patient Instructions (Signed)
 Increase spiro to 25 mg daily - updated Rx sent. Labs today - will call you if abnormal. Repeat labs in one week - see below. Return to see Dr. Mitzie Anda in 4 months with echo. CALL US  AT [phone] IN AUGUST TO SCHEDULE THIS APPOINTMENT.  Please call us  at [phone] if any questions or concerns prior to your next appointment.

## 2023-06-03 ENCOUNTER — Other Ambulatory Visit (HOSPITAL_COMMUNITY): Payer: Self-pay | Admitting: Family Medicine

## 2023-06-09 ENCOUNTER — Ambulatory Visit (HOSPITAL_COMMUNITY)
Admission: RE | Admit: 2023-06-09 | Discharge: 2023-06-09 | Disposition: A | Discharge status: Home / Self Care | Source: Ambulatory Visit | Attending: Internal Medicine | Admitting: Internal Medicine

## 2023-06-09 DIAGNOSIS — I5022 Chronic systolic (congestive) heart failure: Secondary | ICD-10-CM | POA: Diagnosis not present

## 2023-06-09 LAB — BASIC METABOLIC PANEL WITH GFR
Anion gap: 9 (ref 5–15)
BUN: 15 mg/dL (ref 8–23)
CO2: 29 mmol/L (ref 22–32)
Calcium: 9.7 mg/dL (ref 8.9–10.3)
Chloride: 101 mmol/L (ref 98–111)
Creatinine, Ser: 1.02 mg/dL — ABNORMAL HIGH (ref 0.44–1.00)
GFR, Estimated: 53 mL/min — ABNORMAL LOW (ref >60)
Glucose, Bld: 91 mg/dL (ref 70–99)
Potassium: 4.7 mmol/L (ref 3.5–5.1)
Sodium: 139 mmol/L (ref 135–145)

## 2023-06-15 DIAGNOSIS — C44622 Squamous cell carcinoma of skin of right upper limb, including shoulder: Secondary | ICD-10-CM | POA: Diagnosis not present

## 2023-06-15 DIAGNOSIS — D485 Neoplasm of uncertain behavior of skin: Secondary | ICD-10-CM | POA: Diagnosis not present

## 2023-06-17 ENCOUNTER — Encounter: Payer: Self-pay | Admitting: Internal Medicine

## 2023-06-17 ENCOUNTER — Ambulatory Visit (INDEPENDENT_AMBULATORY_CARE_PROVIDER_SITE_OTHER): Payer: Medicare HMO

## 2023-06-17 DIAGNOSIS — I428 Other cardiomyopathies: Secondary | ICD-10-CM

## 2023-06-17 LAB — CUP PACEART REMOTE DEVICE CHECK
Battery Remaining Longevity: 7 mo
Battery Voltage: 2.84 V
Brady Statistic AP VP Percent: 51.02 %
Brady Statistic AP VS Percent: 0.39 %
Brady Statistic AS VP Percent: 47.84 %
Brady Statistic AS VS Percent: 0.75 %
Brady Statistic RA Percent Paced: 50.97 %
Brady Statistic RV Percent Paced: 98.02 %
Date Time Interrogation Session: 20250515063523
HighPow Impedance: 74 Ohm
Implantable Lead Connection Status: 753985
Implantable Lead Connection Status: 753985
Implantable Lead Connection Status: 753985
Implantable Lead Implant Date: 20150121
Implantable Lead Implant Date: 20150121
Implantable Lead Implant Date: 20150121
Implantable Lead Location: 753858
Implantable Lead Location: 753859
Implantable Lead Location: 753860
Implantable Lead Model: 4298
Implantable Lead Model: 5076
Implantable Pulse Generator Implant Date: 20200717
Lead Channel Impedance Value: 184.154
Lead Channel Impedance Value: 188.1 Ohm
Lead Channel Impedance Value: 188.1 Ohm
Lead Channel Impedance Value: 204.14 Ohm
Lead Channel Impedance Value: 209 Ohm
Lead Channel Impedance Value: 342 Ohm
Lead Channel Impedance Value: 399 Ohm
Lead Channel Impedance Value: 418 Ohm
Lead Channel Impedance Value: 418 Ohm
Lead Channel Impedance Value: 418 Ohm
Lead Channel Impedance Value: 456 Ohm
Lead Channel Impedance Value: 532 Ohm
Lead Channel Impedance Value: 532 Ohm
Lead Channel Impedance Value: 608 Ohm
Lead Channel Impedance Value: 646 Ohm
Lead Channel Impedance Value: 646 Ohm
Lead Channel Impedance Value: 703 Ohm
Lead Channel Impedance Value: 703 Ohm
Lead Channel Pacing Threshold Amplitude: 0.5 V
Lead Channel Pacing Threshold Amplitude: 0.5 V
Lead Channel Pacing Threshold Amplitude: 1.875 V
Lead Channel Pacing Threshold Pulse Width: 0.4 ms
Lead Channel Pacing Threshold Pulse Width: 0.4 ms
Lead Channel Pacing Threshold Pulse Width: 1 ms
Lead Channel Sensing Intrinsic Amplitude: 19 mV
Lead Channel Sensing Intrinsic Amplitude: 19 mV
Lead Channel Sensing Intrinsic Amplitude: 3 mV
Lead Channel Sensing Intrinsic Amplitude: 3 mV
Lead Channel Setting Pacing Amplitude: 1.5 V
Lead Channel Setting Pacing Amplitude: 2 V
Lead Channel Setting Pacing Amplitude: 2.75 V
Lead Channel Setting Pacing Pulse Width: 0.4 ms
Lead Channel Setting Pacing Pulse Width: 1 ms
Lead Channel Setting Sensing Sensitivity: 0.3 mV
Zone Setting Status: 755011
Zone Setting Status: 755011

## 2023-06-20 ENCOUNTER — Ambulatory Visit: Payer: Self-pay | Admitting: Cardiology

## 2023-07-08 ENCOUNTER — Other Ambulatory Visit (HOSPITAL_COMMUNITY): Payer: Self-pay | Admitting: Family Medicine

## 2023-07-19 ENCOUNTER — Ambulatory Visit (INDEPENDENT_AMBULATORY_CARE_PROVIDER_SITE_OTHER)

## 2023-07-19 DIAGNOSIS — I5022 Chronic systolic (congestive) heart failure: Secondary | ICD-10-CM

## 2023-07-19 DIAGNOSIS — I428 Other cardiomyopathies: Secondary | ICD-10-CM

## 2023-07-19 LAB — CUP PACEART REMOTE DEVICE CHECK
Battery Remaining Longevity: 6 mo
Battery Voltage: 2.83 V
Brady Statistic AP VP Percent: 46.86 %
Brady Statistic AP VS Percent: 0.37 %
Brady Statistic AS VP Percent: 51.97 %
Brady Statistic AS VS Percent: 0.8 %
Brady Statistic RA Percent Paced: 47.01 %
Brady Statistic RV Percent Paced: 98.29 %
Date Time Interrogation Session: 20250616063324
HighPow Impedance: 70 Ohm
Implantable Lead Connection Status: 753985
Implantable Lead Connection Status: 753985
Implantable Lead Connection Status: 753985
Implantable Lead Implant Date: 20150121
Implantable Lead Implant Date: 20150121
Implantable Lead Implant Date: 20150121
Implantable Lead Location: 753858
Implantable Lead Location: 753859
Implantable Lead Location: 753860
Implantable Lead Model: 4298
Implantable Lead Model: 5076
Implantable Pulse Generator Implant Date: 20200717
Lead Channel Impedance Value: 172.541
Lead Channel Impedance Value: 172.541
Lead Channel Impedance Value: 176 Ohm
Lead Channel Impedance Value: 199.5 Ohm
Lead Channel Impedance Value: 204.14 Ohm
Lead Channel Impedance Value: 304 Ohm
Lead Channel Impedance Value: 399 Ohm
Lead Channel Impedance Value: 399 Ohm
Lead Channel Impedance Value: 418 Ohm
Lead Channel Impedance Value: 418 Ohm
Lead Channel Impedance Value: 418 Ohm
Lead Channel Impedance Value: 513 Ohm
Lead Channel Impedance Value: 551 Ohm
Lead Channel Impedance Value: 608 Ohm
Lead Channel Impedance Value: 646 Ohm
Lead Channel Impedance Value: 646 Ohm
Lead Channel Impedance Value: 665 Ohm
Lead Channel Impedance Value: 665 Ohm
Lead Channel Pacing Threshold Amplitude: 0.625 V
Lead Channel Pacing Threshold Amplitude: 0.625 V
Lead Channel Pacing Threshold Amplitude: 2.75 V
Lead Channel Pacing Threshold Pulse Width: 0.4 ms
Lead Channel Pacing Threshold Pulse Width: 0.4 ms
Lead Channel Pacing Threshold Pulse Width: 1 ms
Lead Channel Sensing Intrinsic Amplitude: 16.75 mV
Lead Channel Sensing Intrinsic Amplitude: 16.75 mV
Lead Channel Sensing Intrinsic Amplitude: 2.25 mV
Lead Channel Sensing Intrinsic Amplitude: 2.25 mV
Lead Channel Setting Pacing Amplitude: 1.5 V
Lead Channel Setting Pacing Amplitude: 2 V
Lead Channel Setting Pacing Amplitude: 2.75 V
Lead Channel Setting Pacing Pulse Width: 0.4 ms
Lead Channel Setting Pacing Pulse Width: 1 ms
Lead Channel Setting Sensing Sensitivity: 0.3 mV
Zone Setting Status: 755011
Zone Setting Status: 755011

## 2023-07-26 NOTE — Progress Notes (Signed)
 Remote ICD transmission.

## 2023-08-19 ENCOUNTER — Ambulatory Visit

## 2023-08-20 LAB — CUP PACEART REMOTE DEVICE CHECK
Battery Remaining Longevity: 6 mo
Battery Voltage: 2.83 V
Brady Statistic AP VP Percent: 54.83 %
Brady Statistic AP VS Percent: 0.2 %
Brady Statistic AS VP Percent: 44.43 %
Brady Statistic AS VS Percent: 0.54 %
Brady Statistic RA Percent Paced: 54.8 %
Brady Statistic RV Percent Paced: 98.82 %
Date Time Interrogation Session: 20250717061703
HighPow Impedance: 70 Ohm
Implantable Lead Connection Status: 753985
Implantable Lead Connection Status: 753985
Implantable Lead Connection Status: 753985
Implantable Lead Implant Date: 20150121
Implantable Lead Implant Date: 20150121
Implantable Lead Implant Date: 20150121
Implantable Lead Location: 753858
Implantable Lead Location: 753859
Implantable Lead Location: 753860
Implantable Lead Model: 4298
Implantable Lead Model: 5076
Implantable Pulse Generator Implant Date: 20200717
Lead Channel Impedance Value: 184.154
Lead Channel Impedance Value: 188.1 Ohm
Lead Channel Impedance Value: 188.1 Ohm
Lead Channel Impedance Value: 204.14 Ohm
Lead Channel Impedance Value: 204.14 Ohm
Lead Channel Impedance Value: 342 Ohm
Lead Channel Impedance Value: 399 Ohm
Lead Channel Impedance Value: 418 Ohm
Lead Channel Impedance Value: 418 Ohm
Lead Channel Impedance Value: 418 Ohm
Lead Channel Impedance Value: 418 Ohm
Lead Channel Impedance Value: 532 Ohm
Lead Channel Impedance Value: 532 Ohm
Lead Channel Impedance Value: 608 Ohm
Lead Channel Impedance Value: 608 Ohm
Lead Channel Impedance Value: 646 Ohm
Lead Channel Impedance Value: 665 Ohm
Lead Channel Impedance Value: 703 Ohm
Lead Channel Pacing Threshold Amplitude: 0.5 V
Lead Channel Pacing Threshold Amplitude: 0.5 V
Lead Channel Pacing Threshold Amplitude: 2.375 V
Lead Channel Pacing Threshold Pulse Width: 0.4 ms
Lead Channel Pacing Threshold Pulse Width: 0.4 ms
Lead Channel Pacing Threshold Pulse Width: 1 ms
Lead Channel Sensing Intrinsic Amplitude: 14.125 mV
Lead Channel Sensing Intrinsic Amplitude: 14.125 mV
Lead Channel Sensing Intrinsic Amplitude: 3 mV
Lead Channel Sensing Intrinsic Amplitude: 3 mV
Lead Channel Setting Pacing Amplitude: 1.5 V
Lead Channel Setting Pacing Amplitude: 2 V
Lead Channel Setting Pacing Amplitude: 2.75 V
Lead Channel Setting Pacing Pulse Width: 0.4 ms
Lead Channel Setting Pacing Pulse Width: 1 ms
Lead Channel Setting Sensing Sensitivity: 0.3 mV
Zone Setting Status: 755011
Zone Setting Status: 755011

## 2023-08-26 NOTE — Progress Notes (Signed)
 Remote ICD transmission.

## 2023-08-29 ENCOUNTER — Other Ambulatory Visit (HOSPITAL_COMMUNITY): Payer: Self-pay | Admitting: Cardiology

## 2023-09-02 DIAGNOSIS — E782 Mixed hyperlipidemia: Secondary | ICD-10-CM | POA: Diagnosis not present

## 2023-09-02 DIAGNOSIS — F324 Major depressive disorder, single episode, in partial remission: Secondary | ICD-10-CM | POA: Diagnosis not present

## 2023-09-02 DIAGNOSIS — I5022 Chronic systolic (congestive) heart failure: Secondary | ICD-10-CM | POA: Diagnosis not present

## 2023-09-02 DIAGNOSIS — I480 Paroxysmal atrial fibrillation: Secondary | ICD-10-CM | POA: Diagnosis not present

## 2023-09-10 ENCOUNTER — Other Ambulatory Visit (HOSPITAL_COMMUNITY): Payer: Self-pay | Admitting: Internal Medicine

## 2023-09-20 ENCOUNTER — Ambulatory Visit (INDEPENDENT_AMBULATORY_CARE_PROVIDER_SITE_OTHER)

## 2023-09-20 DIAGNOSIS — I428 Other cardiomyopathies: Secondary | ICD-10-CM

## 2023-09-21 LAB — CUP PACEART REMOTE DEVICE CHECK
Battery Remaining Longevity: 6 mo
Battery Voltage: 2.81 V
Brady Statistic AP VP Percent: 59.5 %
Brady Statistic AP VS Percent: 0.25 %
Brady Statistic AS VP Percent: 39.47 %
Brady Statistic AS VS Percent: 0.77 %
Brady Statistic RA Percent Paced: 58.59 %
Brady Statistic RV Percent Paced: 97.45 %
Date Time Interrogation Session: 20250818073824
HighPow Impedance: 74 Ohm
Implantable Lead Connection Status: 753985
Implantable Lead Connection Status: 753985
Implantable Lead Connection Status: 753985
Implantable Lead Implant Date: 20150121
Implantable Lead Implant Date: 20150121
Implantable Lead Implant Date: 20150121
Implantable Lead Location: 753858
Implantable Lead Location: 753859
Implantable Lead Location: 753860
Implantable Lead Model: 4298
Implantable Lead Model: 5076
Implantable Pulse Generator Implant Date: 20200717
Lead Channel Impedance Value: 172.541
Lead Channel Impedance Value: 172.541
Lead Channel Impedance Value: 182.4 Ohm
Lead Channel Impedance Value: 199.5 Ohm
Lead Channel Impedance Value: 212.8 Ohm
Lead Channel Impedance Value: 304 Ohm
Lead Channel Impedance Value: 399 Ohm
Lead Channel Impedance Value: 399 Ohm
Lead Channel Impedance Value: 418 Ohm
Lead Channel Impedance Value: 456 Ohm
Lead Channel Impedance Value: 475 Ohm
Lead Channel Impedance Value: 532 Ohm
Lead Channel Impedance Value: 532 Ohm
Lead Channel Impedance Value: 589 Ohm
Lead Channel Impedance Value: 646 Ohm
Lead Channel Impedance Value: 646 Ohm
Lead Channel Impedance Value: 703 Ohm
Lead Channel Impedance Value: 722 Ohm
Lead Channel Pacing Threshold Amplitude: 0.5 V
Lead Channel Pacing Threshold Amplitude: 0.625 V
Lead Channel Pacing Threshold Amplitude: 2.75 V
Lead Channel Pacing Threshold Pulse Width: 0.4 ms
Lead Channel Pacing Threshold Pulse Width: 0.4 ms
Lead Channel Pacing Threshold Pulse Width: 1 ms
Lead Channel Sensing Intrinsic Amplitude: 17.75 mV
Lead Channel Sensing Intrinsic Amplitude: 17.75 mV
Lead Channel Sensing Intrinsic Amplitude: 2.5 mV
Lead Channel Sensing Intrinsic Amplitude: 2.5 mV
Lead Channel Setting Pacing Amplitude: 1.5 V
Lead Channel Setting Pacing Amplitude: 2 V
Lead Channel Setting Pacing Amplitude: 2.75 V
Lead Channel Setting Pacing Pulse Width: 0.4 ms
Lead Channel Setting Pacing Pulse Width: 1 ms
Lead Channel Setting Sensing Sensitivity: 0.3 mV
Zone Setting Status: 755011
Zone Setting Status: 755011

## 2023-09-23 ENCOUNTER — Ambulatory Visit: Payer: Self-pay | Admitting: Cardiology

## 2023-09-24 NOTE — Telephone Encounter (Signed)
 Pt returning nurse call

## 2023-09-24 NOTE — Progress Notes (Signed)
 Patient called and advised of findings.   Device clinic apt made 10/07/2023 @ 10:00 am. Location, date and time discussed with patient. Was appreciative of call.

## 2023-10-03 DIAGNOSIS — I480 Paroxysmal atrial fibrillation: Secondary | ICD-10-CM | POA: Diagnosis not present

## 2023-10-03 DIAGNOSIS — I5022 Chronic systolic (congestive) heart failure: Secondary | ICD-10-CM | POA: Diagnosis not present

## 2023-10-03 DIAGNOSIS — F324 Major depressive disorder, single episode, in partial remission: Secondary | ICD-10-CM | POA: Diagnosis not present

## 2023-10-03 DIAGNOSIS — E782 Mixed hyperlipidemia: Secondary | ICD-10-CM | POA: Diagnosis not present

## 2023-10-07 ENCOUNTER — Ambulatory Visit: Attending: Cardiology

## 2023-10-07 DIAGNOSIS — I428 Other cardiomyopathies: Secondary | ICD-10-CM

## 2023-10-07 LAB — CUP PACEART INCLINIC DEVICE CHECK
Date Time Interrogation Session: 20250904123559
Implantable Lead Connection Status: 753985
Implantable Lead Connection Status: 753985
Implantable Lead Connection Status: 753985
Implantable Lead Implant Date: 20150121
Implantable Lead Implant Date: 20150121
Implantable Lead Implant Date: 20150121
Implantable Lead Location: 753858
Implantable Lead Location: 753859
Implantable Lead Location: 753860
Implantable Lead Model: 4298
Implantable Lead Model: 5076
Implantable Pulse Generator Implant Date: 20200717

## 2023-10-07 NOTE — Patient Instructions (Signed)
 Follow up as scheduled.

## 2023-10-07 NOTE — Progress Notes (Signed)
 Patient brought into clinic to test LV threshold. See attachment for details. Will continue to monitor.   Programming changes: - LV threshold output changed from 2.75V @ 1.52ms to 3.0V @ 1.74ms.

## 2023-10-21 ENCOUNTER — Encounter

## 2023-10-21 LAB — CUP PACEART REMOTE DEVICE CHECK
Battery Remaining Longevity: 5 mo
Battery Voltage: 2.8 V
Brady Statistic AP VP Percent: 66.84 %
Brady Statistic AP VS Percent: 0.17 %
Brady Statistic AS VP Percent: 32.53 %
Brady Statistic AS VS Percent: 0.45 %
Brady Statistic RA Percent Paced: 65.37 %
Brady Statistic RV Percent Paced: 97.54 %
Date Time Interrogation Session: 20250918072404
HighPow Impedance: 70 Ohm
Implantable Lead Connection Status: 753985
Implantable Lead Connection Status: 753985
Implantable Lead Connection Status: 753985
Implantable Lead Implant Date: 20150121
Implantable Lead Implant Date: 20150121
Implantable Lead Implant Date: 20150121
Implantable Lead Location: 753858
Implantable Lead Location: 753859
Implantable Lead Location: 753860
Implantable Lead Model: 4298
Implantable Lead Model: 5076
Implantable Pulse Generator Implant Date: 20200717
Lead Channel Impedance Value: 184.154
Lead Channel Impedance Value: 184.154
Lead Channel Impedance Value: 188.1 Ohm
Lead Channel Impedance Value: 199.5 Ohm
Lead Channel Impedance Value: 204.14 Ohm
Lead Channel Impedance Value: 342 Ohm
Lead Channel Impedance Value: 399 Ohm
Lead Channel Impedance Value: 399 Ohm
Lead Channel Impedance Value: 418 Ohm
Lead Channel Impedance Value: 418 Ohm
Lead Channel Impedance Value: 456 Ohm
Lead Channel Impedance Value: 513 Ohm
Lead Channel Impedance Value: 532 Ohm
Lead Channel Impedance Value: 608 Ohm
Lead Channel Impedance Value: 646 Ohm
Lead Channel Impedance Value: 646 Ohm
Lead Channel Impedance Value: 665 Ohm
Lead Channel Impedance Value: 665 Ohm
Lead Channel Pacing Threshold Amplitude: 0.5 V
Lead Channel Pacing Threshold Amplitude: 0.625 V
Lead Channel Pacing Threshold Amplitude: 2.5 V
Lead Channel Pacing Threshold Pulse Width: 0.4 ms
Lead Channel Pacing Threshold Pulse Width: 0.4 ms
Lead Channel Pacing Threshold Pulse Width: 1 ms
Lead Channel Sensing Intrinsic Amplitude: 16.25 mV
Lead Channel Sensing Intrinsic Amplitude: 16.25 mV
Lead Channel Sensing Intrinsic Amplitude: 2.75 mV
Lead Channel Sensing Intrinsic Amplitude: 2.75 mV
Lead Channel Setting Pacing Amplitude: 1.5 V
Lead Channel Setting Pacing Amplitude: 2 V
Lead Channel Setting Pacing Amplitude: 3 V
Lead Channel Setting Pacing Pulse Width: 0.4 ms
Lead Channel Setting Pacing Pulse Width: 1 ms
Lead Channel Setting Sensing Sensitivity: 0.3 mV
Zone Setting Status: 755011
Zone Setting Status: 755011

## 2023-10-27 NOTE — Progress Notes (Signed)
Remote ICD Transmission.

## 2023-11-02 DIAGNOSIS — F324 Major depressive disorder, single episode, in partial remission: Secondary | ICD-10-CM | POA: Diagnosis not present

## 2023-11-02 DIAGNOSIS — I5022 Chronic systolic (congestive) heart failure: Secondary | ICD-10-CM | POA: Diagnosis not present

## 2023-11-02 DIAGNOSIS — I480 Paroxysmal atrial fibrillation: Secondary | ICD-10-CM | POA: Diagnosis not present

## 2023-11-02 DIAGNOSIS — E782 Mixed hyperlipidemia: Secondary | ICD-10-CM | POA: Diagnosis not present

## 2023-11-09 ENCOUNTER — Ambulatory Visit (HOSPITAL_COMMUNITY): Payer: Self-pay | Admitting: Family Medicine

## 2023-11-09 ENCOUNTER — Ambulatory Visit (HOSPITAL_COMMUNITY)
Admission: RE | Admit: 2023-11-09 | Discharge: 2023-11-09 | Disposition: A | Discharge status: Home / Self Care | Source: Ambulatory Visit | Attending: Family Medicine | Admitting: Family Medicine

## 2023-11-09 ENCOUNTER — Ambulatory Visit (HOSPITAL_COMMUNITY): Payer: Self-pay | Admitting: Cardiology

## 2023-11-09 ENCOUNTER — Ambulatory Visit (HOSPITAL_COMMUNITY)
Admission: RE | Admit: 2023-11-09 | Discharge: 2023-11-09 | Disposition: A | Source: Ambulatory Visit | Attending: Cardiology | Admitting: Cardiology

## 2023-11-09 ENCOUNTER — Ambulatory Visit

## 2023-11-09 ENCOUNTER — Other Ambulatory Visit (HOSPITAL_COMMUNITY): Payer: Self-pay

## 2023-11-09 ENCOUNTER — Telehealth (HOSPITAL_COMMUNITY): Payer: Self-pay

## 2023-11-09 VITALS — BP 112/68 | HR 62 | Wt 187.0 lb

## 2023-11-09 DIAGNOSIS — Z95810 Presence of automatic (implantable) cardiac defibrillator: Secondary | ICD-10-CM | POA: Insufficient documentation

## 2023-11-09 DIAGNOSIS — I5021 Acute systolic (congestive) heart failure: Secondary | ICD-10-CM | POA: Diagnosis not present

## 2023-11-09 DIAGNOSIS — Z7984 Long term (current) use of oral hypoglycemic drugs: Secondary | ICD-10-CM | POA: Diagnosis not present

## 2023-11-09 DIAGNOSIS — I5022 Chronic systolic (congestive) heart failure: Secondary | ICD-10-CM | POA: Diagnosis not present

## 2023-11-09 DIAGNOSIS — I428 Other cardiomyopathies: Secondary | ICD-10-CM | POA: Diagnosis not present

## 2023-11-09 DIAGNOSIS — R5383 Other fatigue: Secondary | ICD-10-CM | POA: Diagnosis not present

## 2023-11-09 DIAGNOSIS — Z79899 Other long term (current) drug therapy: Secondary | ICD-10-CM | POA: Diagnosis not present

## 2023-11-09 DIAGNOSIS — G629 Polyneuropathy, unspecified: Secondary | ICD-10-CM | POA: Diagnosis not present

## 2023-11-09 DIAGNOSIS — I082 Rheumatic disorders of both aortic and tricuspid valves: Secondary | ICD-10-CM | POA: Insufficient documentation

## 2023-11-09 DIAGNOSIS — I493 Ventricular premature depolarization: Secondary | ICD-10-CM | POA: Diagnosis not present

## 2023-11-09 DIAGNOSIS — I110 Hypertensive heart disease with heart failure: Secondary | ICD-10-CM | POA: Insufficient documentation

## 2023-11-09 LAB — BASIC METABOLIC PANEL WITH GFR
Anion gap: 10 (ref 5–15)
BUN: 21 mg/dL (ref 8–23)
CO2: 30 mmol/L (ref 22–32)
Calcium: 9.4 mg/dL (ref 8.9–10.3)
Chloride: 100 mmol/L (ref 98–111)
Creatinine, Ser: 1.13 mg/dL — ABNORMAL HIGH (ref 0.44–1.00)
GFR, Estimated: 47 mL/min — ABNORMAL LOW (ref >60)
Glucose, Bld: 78 mg/dL (ref 70–99)
Potassium: 4.3 mmol/L (ref 3.5–5.1)
Sodium: 140 mmol/L (ref 135–145)

## 2023-11-09 LAB — ECHOCARDIOGRAM COMPLETE
Area-P 1/2: 2.18 cm2
S' Lateral: 4.5 cm

## 2023-11-09 LAB — BRAIN NATRIURETIC PEPTIDE: B Natriuretic Peptide: 24.9 pg/mL (ref 0.0–100.0)

## 2023-11-09 MED ORDER — FARXIGA 10 MG PO TABS: 10.0000 mg | ORAL_TABLET | Freq: Every day | ORAL | 3 refills | Status: AC

## 2023-11-09 NOTE — Telephone Encounter (Signed)
 [facility] Patient Advocate Encounter  The patient was approved for a [employer] grant that will help cover the cost of Carvedilol , Farxiga/Jardiance, Losartan , Spironolactone .  Total amount awarded, $7,500.  Effective: 10/10/2023 - 10/08/2024.  BIN [id number] PCN [id number] Group [id number] ID [id number]  Pharmacy provided with approval and processing information. Patient informed while in office.  Rachel DEL, CPhT Rx Patient Advocate Phone: [phone]

## 2023-11-09 NOTE — Progress Notes (Signed)
 Patient ID: Heather Smith, female   DOB: 10/22/1932, 89 y.o.   MRN: [id number] PCP: Dr. Arloa HF Cardiology: Rolan EP: Dr. Fernande   Chief complaint: CHF  89 y.o. with history of nonischemic cardiomyopathy and chronic systolic HF.  In 9/14, she was admitted to [facility] with chest pain.  Cardiac cath was done, showing no significant coronary disease.  However, echo showed EF 30-35%.  She was started on meds for CHF, and echo was repeated in 12/14, EF was read as 20-25%.  She had a Medtonic CRT-D device placed (had baseline LBBB).  In 3/15, the LV lead was not capturing properly so device had to be reprogrammed.  She had AV optimization in 5/15.  Limited echo at that time showed improvement in EF to 40-45%.  CPX in 5/15 was submaximal but suggested low normal to mildly reduced functional capacity. Echo in 6/16 showed EF 35-40% with diffuse hypokinesis.  CPX was done in 9/16.  This actually showed good functional capacity. She had repeat echo in 1/17, showing EF increased to 60-65%.  Most recent echo in 8/18 showed EF 50-55% with mild LV dilation and mild MR.    She was seen by Dr. Fernande for frequent PVCs (symptomatic).  She was started on flecainide .    Atypical chest pain in 11/19, Cardiolite  was normal.   Repeat echo in 1/20 showed EF 50-55%, moderate MR.    PYP scan in 1/21 was not suggestive of TTR amyloidosis.  Echo in 5/21 showed EF 50-55%, normal RV, mild MR.   She was admitted 12/22 with near syncope and AKI due to hypotension in setting of COVID-19 infection. SCr 1.5 on admit (baseline ~1.1). Also had brief episodes of atrial flutter, captured on device interrogation (longest duration 8 min). Head CT was negative. She was treated by general cardiology. Per documentation, there was no evidence of  acute HF decompensation/hypervolemia. Coreg , Entresto  and diuretics were initially held. Gentle IVF hydration administered. AKI resolved and BP improved. She had no further AFL/SVT.  Echo showed  EF 55-60%. RV ok. No significant valvular dysfunction. HF meds were reintroduced prior to d/c, lasix  PRN. She completed antiviral Molpunavir for COVID. Scr 1.07 at discharge.   Echo 11/23 showed EF 60-65%, normal RV, moderate MR.  S/p R TKA 4/24.  Echo was done today and reviewed, EF 60%, normal RV, mild MR.   Today she returns for HF follow up with her son.  She has been doing well generally.  She gets short of breath walking up a hill.  She fatigue easily. No chest pain.  No lightheadedness/dizziness.  She has not had to use Lasix .  Weight down 1 lb.   ECG (personally reviewed): A-BiV dual pacing  Medtronic device interrogation (personally reviewed): Thoracic impedance trending down but fluid index still < threshold, 95% BiV pacing, no AF/VT.   Labs (10/23): K 4.7, creatinine 1.17, normal LFTs, LDL 74, HDL 38, TGs 214 Labs (7/24): K 4.9, creatinine 1.08 Labs (4/25): LDL 71 Labs (5/25): K 4.7, creatinine 1.02  PMH: 1. Chronic systolic CHF: Nonischemic cardiomyopathy.  LHC (9/14) with no significant coronary disease, EF 25-30% with global hypokinesis.  Echo (9/14) with EF 30-35%, moderate LV dilation, diffuse hypokinesis.  Echo (12/14) with EF 20-25%.  She does not drink ETOH.  She had Medtronic CRT-D device implantation in 1/15. Echo (5/15) with EF 40-45%, mild LVH.  CPX (5/15) was submaximal with RER 0.99, peak VO2 14, VE/VCO2 37.3; low normal functional capacity may be limited by  loss of BiV pacing at higher HR and by deconditioning but interpretation somewhat limited since study was submaximal.  Echo (6/16) with EF 35-40%, severe LV dilation, diffuse hypokinesis, mild MR.  CPX (9/16) with peak VO2 17.7, VE/VCO2 slope 32.3, RER 1.11 => excellent functional capacity. Echo (11/17) with EF 60-65%.  - Echo (8/18): EF 50-55%, mildly dilated LV, mild MR.  - Echo (1/20): EF 50-55%, moderate MR.  - PYP scan (1/21): H/CL 1.0, grade 1.  Not suggestive of TTR amyloidosis.  - Echo (5/21): EF 50-55%,  normal RV, mild MR. - Ltd echo (12/22): EF 55-60% - Echo (11/23): EF 60-65%, normal RV, moderate MR - Echo (10/25): EF 60%, normal RV, mild MR.  2. Back pain s/p lumbar laminectomy 3. OA: Knees. Left TKR in 6/17.  4. Hyperlipidemia 5. Depression 6. PVCs 7. Peripheral neuropathy: Uncertain etiology.  She is not a diabetic.  8. Lung nodule: Last CT chest in 1/19 showed stable 6 mm LLL nodule, no further followup recommended.  9. Chest pain: Cardiolite  (11/19) with EF 74%, no evidence for ischemia/infarction.   SH: Lives [place], married, never smoked, no ETOH.    FH: Mother with MI at 22, possible ischemic cardiomyopathy.  Father with cardiomyopathy, died at 12.  She has 5 brothers, none with significant coronary disease.    ROS: All systems reviewed and negative except as per HPI.   Current Outpatient Medications  Medication Sig Dispense Refill   acetaminophen  (TYLENOL ) 325 MG tablet Take 1-2 tablets (325-650 mg total) by mouth every 4 (four) hours as needed for moderate pain, headache or mild pain. 60 tablet 0   carvedilol  (COREG ) 6.25 MG tablet TAKE 1 TABLET BY MOUTH TWICE DAILY WITH MEALS 60 tablet 11   Cholecalciferol  (VITAMIN D -3) 25 MCG (1000 UT) CAPS Take 1,000 Units by mouth daily.     diclofenac Sodium (VOLTAREN) 1 % GEL Apply 1 Application topically at bedtime.     DULoxetine  (CYMBALTA ) 60 MG capsule Take 60 mg by mouth daily.     ezetimibe  (ZETIA ) 10 MG tablet Take 1 tablet by mouth once daily 90 tablet 2   FARXIGA 10 MG TABS tablet Take 1 tablet (10 mg total) by mouth daily before breakfast. 90 tablet 3   flecainide  (TAMBOCOR ) 50 MG tablet TAKE 1 TABLET BY MOUTH TWICE DAILY 60 tablet 11   furosemide  (LASIX ) 20 MG tablet Take 1 tablet (20 mg total) by mouth daily as needed (for weight gain of 3 lbs or more overnight or 5 lbs or more in a week). 30 tablet 3   levothyroxine  (SYNTHROID , LEVOTHROID) 50 MCG tablet Take 50 mcg by mouth daily before breakfast.     lidocaine   (XYLOCAINE ) 5 % ointment Apply 1 Application topically as needed (for neuropathy pain). 35.44 g 0   losartan  (COZAAR ) 25 MG tablet TAKE 1 TABLET BY MOUTH ONCE DAILY APPOINTMENT REQUIRED FOR FUTHER REFILLS 90 tablet 1   Magnesium  250 MG TABS Take 250 mg by mouth daily.     Multiple Vitamins-Minerals (MULTIVITAMIN WITH MINERALS) tablet Take 1 tablet by mouth daily.     OVER THE COUNTER MEDICATION Take 2 each by mouth daily. Circulation Sweets Gummies     pantoprazole  (PROTONIX ) 40 MG tablet Take 40 mg by mouth 2 (two) times daily.     rosuvastatin  (CRESTOR ) 10 MG tablet Take 10 mg by mouth 3 (three) times a week.     sennosides-docusate sodium  (SENOKOT-S) 8.6-50 MG tablet Take 2 tablets by mouth daily. 30 tablet  1   spironolactone  (ALDACTONE ) 25 MG tablet Take 1 tablet (25 mg total) by mouth daily. 90 tablet 3   traMADol  (ULTRAM ) 50 MG tablet Take 50 mg by mouth daily as needed.     traZODone  (DESYREL ) 50 MG tablet Take 50 mg by mouth at bedtime.     vitamin B-12 (CYANOCOBALAMIN ) 1000 MCG tablet Take 1,000 mcg by mouth daily.     zinc  gluconate 50 MG tablet Take 50 mg by mouth daily.     No current facility-administered medications for this encounter.   BP 112/68   Pulse 62   Wt 84.8 kg (187 lb)   SpO2 94%   BMI 29.29 kg/m   Wt Readings from Last 3 Encounters:  11/09/23 84.8 kg (187 lb)  06/01/23 85.6 kg (188 lb 12.8 oz)  02/15/23 83.7 kg (184 lb 9.6 oz)   PHYSICAL EXAM: General: NAD Neck: No JVD, no thyromegaly or thyroid  nodule.  Lungs: Clear to auscultation bilaterally with normal respiratory effort. CV: Nondisplaced PMI.  Heart regular S1/S2, no S3/S4, no murmur.  No peripheral edema.  No carotid bruit.  Normal pedal pulses.  Abdomen: Soft, nontender, no hepatosplenomegaly, no distention.  Skin: Intact without lesions or rashes.  Neurologic: Alert and oriented x 3.  Psych: Normal affect. Extremities: No clubbing or cyanosis.  HEENT: Normal.    Assessment/Plan: 1. Chronic  systolic CHF: Nonischemic cardiomyopathy s/p Medtronic CRT-D, she was a responder to CRT. EF 35-40% (6/16). She does have a family history of cardiomyopathy of uncertain etiology (father) but none of her siblings developed a cardiomyopathy.  Familial cardiomyopathy is a consideration.  Viral myocarditis is also a possible etiology.  Echo in 1/17 showed improvement in EF to 60-65%, echo in 8/18 showed EF 50-55% and echo in 1/20 showed stable EF 50-55%.  PYP scan not suggestive of TTR amyloidosis. Echo in 5/21 showed EF 50-55%, normal RV, mild MR. Echo 12/22 EF 55-60%, RV ok. Echo 11/23 showed EF 60-65%, normal RV, moderate MR. Echo today was reviewed and showed EF 60%, normal RV, mild MR.  NYHA II. Not volume overloaded on exam though thoracic impedance is decreasing on Optivol.  - Continue spironolactone  25 mg daily. - Continue losartan  25 mg daily. - Continue Coreg  6.25 mg bid . - Add Farxiga 10 mg daily. BMET/BNP today, BMET in 10 days.   2. PVCs: Followed by EP, now on flecainide . Minimal palpitations now.  - Continue flecainide  50 mg bid.  Will need nodal blocking agent while on flecainide , she is on Coreg .  3. Peripheral neuropathy: She has had a bothersome peripheral neuropathy. PYP scan not suggestive of TTR amyloidosis.                    4. HTN: BP stable today. 5. PAD: Abnormal ABIs on insurance screening.  She has peripheral neuropathy symptoms but no definite claudication. LE arterial dopplers 10/22 w/ normal ABIs   Follow up in 4 months with APP  I spent 32 minutes reviewing records, interviewing/examining patient, and managing orders.   Ezra Shuck,  11/09/2023

## 2023-11-09 NOTE — Patient Instructions (Signed)
 START Farixga 10 mg daily.  Labs done today, your results will be available in MyChart, we will contact you for abnormal readings.  REPEAT blood work in 10 days.  Your physician recommends that you schedule a follow-up appointment in: 4 months.  If you have any questions or concerns before your next appointment please send us  a message through [place] or call our office at [phone].    TO LEAVE A MESSAGE FOR THE NURSE SELECT OPTION 2, PLEASE LEAVE A MESSAGE INCLUDING: YOUR NAME DATE OF BIRTH CALL BACK NUMBER REASON FOR CALL**this is important as we prioritize the call backs  YOU WILL RECEIVE A CALL BACK THE SAME DAY AS LONG AS YOU CALL BEFORE 4:00 PM  At the [facility], you and your health needs are our priority. As part of our continuing mission to provide you with exceptional heart care, we have created designated Provider Care Teams. These Care Teams include your primary Cardiologist (physician) and Advanced Practice Providers (APPs- Physician Assistants and Nurse Practitioners) who all work together to provide you with the care you need, when you need it.   You may see any of the following providers on your designated Care Team at your next follow up: Dr Toribio Fuel Dr Ezra Shuck Dr. Ria Commander Dr. Morene Brownie Amy Lenetta, NP Caffie Shed, [place] [facility] [place], [place] Beckey Coe, NP [place] Lee, NP Ellouise Class, NP Tinnie Redman, PharmD Jaun Bash, PharmD   Please be sure to bring in all your medications bottles to every appointment.    Thank you for choosing [facility]

## 2023-11-10 LAB — CUP PACEART REMOTE DEVICE CHECK
Battery Remaining Longevity: 4 mo
Battery Voltage: 2.79 V
Brady Statistic AP VP Percent: 65.14 %
Brady Statistic AP VS Percent: 0.19 %
Brady Statistic AS VP Percent: 34.14 %
Brady Statistic AS VS Percent: 0.53 %
Brady Statistic RA Percent Paced: 63.77 %
Brady Statistic RV Percent Paced: 97.49 %
Date Time Interrogation Session: 20251007145915
HighPow Impedance: 85 Ohm
Implantable Lead Connection Status: 753985
Implantable Lead Connection Status: 753985
Implantable Lead Connection Status: 753985
Implantable Lead Implant Date: 20150121
Implantable Lead Implant Date: 20150121
Implantable Lead Implant Date: 20150121
Implantable Lead Location: 753858
Implantable Lead Location: 753859
Implantable Lead Location: 753860
Implantable Lead Model: 4298
Implantable Lead Model: 5076
Implantable Pulse Generator Implant Date: 20200717
Lead Channel Impedance Value: 180.5 Ohm
Lead Channel Impedance Value: 193.707
Lead Channel Impedance Value: 201.488
Lead Channel Impedance Value: 201.488
Lead Channel Impedance Value: 218.087
Lead Channel Impedance Value: 361 Ohm
Lead Channel Impedance Value: 361 Ohm
Lead Channel Impedance Value: 399 Ohm
Lead Channel Impedance Value: 418 Ohm
Lead Channel Impedance Value: 418 Ohm
Lead Channel Impedance Value: 456 Ohm
Lead Channel Impedance Value: 513 Ohm
Lead Channel Impedance Value: 513 Ohm
Lead Channel Impedance Value: 608 Ohm
Lead Channel Impedance Value: 665 Ohm
Lead Channel Impedance Value: 703 Ohm
Lead Channel Impedance Value: 722 Ohm
Lead Channel Impedance Value: 760 Ohm
Lead Channel Pacing Threshold Amplitude: 0.5 V
Lead Channel Pacing Threshold Amplitude: 0.625 V
Lead Channel Pacing Threshold Amplitude: 2.5 V
Lead Channel Pacing Threshold Pulse Width: 0.4 ms
Lead Channel Pacing Threshold Pulse Width: 0.4 ms
Lead Channel Pacing Threshold Pulse Width: 1 ms
Lead Channel Sensing Intrinsic Amplitude: 16.25 mV
Lead Channel Sensing Intrinsic Amplitude: 16.25 mV
Lead Channel Sensing Intrinsic Amplitude: 2.875 mV
Lead Channel Sensing Intrinsic Amplitude: 2.875 mV
Lead Channel Setting Pacing Amplitude: 1.5 V
Lead Channel Setting Pacing Amplitude: 2 V
Lead Channel Setting Pacing Amplitude: 3 V
Lead Channel Setting Pacing Pulse Width: 0.4 ms
Lead Channel Setting Pacing Pulse Width: 1 ms
Lead Channel Setting Sensing Sensitivity: 0.3 mV
Zone Setting Status: 755011
Zone Setting Status: 755011

## 2023-11-19 ENCOUNTER — Ambulatory Visit (HOSPITAL_COMMUNITY)
Admission: RE | Admit: 2023-11-19 | Discharge: 2023-11-19 | Disposition: A | Source: Ambulatory Visit | Attending: Internal Medicine

## 2023-11-19 DIAGNOSIS — I5022 Chronic systolic (congestive) heart failure: Secondary | ICD-10-CM | POA: Diagnosis not present

## 2023-11-19 LAB — BASIC METABOLIC PANEL WITH GFR
Anion gap: 9 (ref 5–15)
BUN: 21 mg/dL (ref 8–23)
CO2: 30 mmol/L (ref 22–32)
Calcium: 9.5 mg/dL (ref 8.9–10.3)
Chloride: 103 mmol/L (ref 98–111)
Creatinine, Ser: 1.21 mg/dL — ABNORMAL HIGH (ref 0.44–1.00)
GFR, Estimated: 43 mL/min — ABNORMAL LOW (ref >60)
Glucose, Bld: 114 mg/dL — ABNORMAL HIGH (ref 70–99)
Potassium: 4.6 mmol/L (ref 3.5–5.1)
Sodium: 142 mmol/L (ref 135–145)

## 2023-11-22 ENCOUNTER — Encounter

## 2023-11-23 ENCOUNTER — Other Ambulatory Visit (HOSPITAL_COMMUNITY): Payer: Self-pay | Admitting: Family Medicine

## 2023-11-27 ENCOUNTER — Other Ambulatory Visit (HOSPITAL_COMMUNITY): Payer: Self-pay | Admitting: Cardiology

## 2023-12-03 DIAGNOSIS — I5022 Chronic systolic (congestive) heart failure: Secondary | ICD-10-CM | POA: Diagnosis not present

## 2023-12-03 DIAGNOSIS — I480 Paroxysmal atrial fibrillation: Secondary | ICD-10-CM | POA: Diagnosis not present

## 2023-12-03 DIAGNOSIS — E782 Mixed hyperlipidemia: Secondary | ICD-10-CM | POA: Diagnosis not present

## 2023-12-03 DIAGNOSIS — F324 Major depressive disorder, single episode, in partial remission: Secondary | ICD-10-CM | POA: Diagnosis not present

## 2023-12-23 ENCOUNTER — Ambulatory Visit

## 2023-12-23 DIAGNOSIS — I5022 Chronic systolic (congestive) heart failure: Secondary | ICD-10-CM | POA: Diagnosis not present

## 2023-12-23 LAB — CUP PACEART REMOTE DEVICE CHECK
Battery Remaining Longevity: 3 mo
Battery Voltage: 2.76 V
Brady Statistic AP VP Percent: 67.66 %
Brady Statistic AP VS Percent: 0.39 %
Brady Statistic AS VP Percent: 30.99 %
Brady Statistic AS VS Percent: 0.95 %
Brady Statistic RA Percent Paced: 65.06 %
Brady Statistic RV Percent Paced: 95.42 %
Date Time Interrogation Session: 20251120073724
HighPow Impedance: 71 Ohm
Implantable Lead Connection Status: 753985
Implantable Lead Connection Status: 753985
Implantable Lead Connection Status: 753985
Implantable Lead Implant Date: 20150121
Implantable Lead Implant Date: 20150121
Implantable Lead Implant Date: 20150121
Implantable Lead Location: 753858
Implantable Lead Location: 753859
Implantable Lead Location: 753860
Implantable Lead Model: 4298
Implantable Lead Model: 5076
Implantable Pulse Generator Implant Date: 20200717
Lead Channel Impedance Value: 184.154
Lead Channel Impedance Value: 188.1 Ohm
Lead Channel Impedance Value: 195.429
Lead Channel Impedance Value: 204.14 Ohm
Lead Channel Impedance Value: 218.087
Lead Channel Impedance Value: 342 Ohm
Lead Channel Impedance Value: 399 Ohm
Lead Channel Impedance Value: 418 Ohm
Lead Channel Impedance Value: 418 Ohm
Lead Channel Impedance Value: 456 Ohm
Lead Channel Impedance Value: 456 Ohm
Lead Channel Impedance Value: 551 Ohm
Lead Channel Impedance Value: 551 Ohm
Lead Channel Impedance Value: 665 Ohm
Lead Channel Impedance Value: 665 Ohm
Lead Channel Impedance Value: 703 Ohm
Lead Channel Impedance Value: 722 Ohm
Lead Channel Impedance Value: 722 Ohm
Lead Channel Pacing Threshold Amplitude: 0.5 V
Lead Channel Pacing Threshold Amplitude: 0.625 V
Lead Channel Pacing Threshold Amplitude: 3.25 V
Lead Channel Pacing Threshold Pulse Width: 0.4 ms
Lead Channel Pacing Threshold Pulse Width: 0.4 ms
Lead Channel Pacing Threshold Pulse Width: 1 ms
Lead Channel Sensing Intrinsic Amplitude: 14.875 mV
Lead Channel Sensing Intrinsic Amplitude: 14.875 mV
Lead Channel Sensing Intrinsic Amplitude: 3.25 mV
Lead Channel Sensing Intrinsic Amplitude: 3.25 mV
Lead Channel Setting Pacing Amplitude: 1.5 V
Lead Channel Setting Pacing Amplitude: 2 V
Lead Channel Setting Pacing Amplitude: 3 V
Lead Channel Setting Pacing Pulse Width: 0.4 ms
Lead Channel Setting Pacing Pulse Width: 1 ms
Lead Channel Setting Sensing Sensitivity: 0.3 mV
Zone Setting Status: 755011
Zone Setting Status: 755011

## 2023-12-27 ENCOUNTER — Ambulatory Visit: Payer: Self-pay | Admitting: Cardiology

## 2023-12-27 NOTE — Progress Notes (Signed)
 Remote ICD Transmission

## 2024-01-02 DIAGNOSIS — F324 Major depressive disorder, single episode, in partial remission: Secondary | ICD-10-CM | POA: Diagnosis not present

## 2024-01-02 DIAGNOSIS — I480 Paroxysmal atrial fibrillation: Secondary | ICD-10-CM | POA: Diagnosis not present

## 2024-01-02 DIAGNOSIS — E782 Mixed hyperlipidemia: Secondary | ICD-10-CM | POA: Diagnosis not present

## 2024-01-02 DIAGNOSIS — I5022 Chronic systolic (congestive) heart failure: Secondary | ICD-10-CM | POA: Diagnosis not present

## 2024-01-06 DIAGNOSIS — D485 Neoplasm of uncertain behavior of skin: Secondary | ICD-10-CM | POA: Diagnosis not present

## 2024-01-06 DIAGNOSIS — L430 Hypertrophic lichen planus: Secondary | ICD-10-CM | POA: Diagnosis not present

## 2024-01-06 DIAGNOSIS — L820 Inflamed seborrheic keratosis: Secondary | ICD-10-CM | POA: Diagnosis not present

## 2024-01-06 DIAGNOSIS — L821 Other seborrheic keratosis: Secondary | ICD-10-CM | POA: Diagnosis not present

## 2024-01-06 DIAGNOSIS — B078 Other viral warts: Secondary | ICD-10-CM | POA: Diagnosis not present

## 2024-01-06 DIAGNOSIS — L570 Actinic keratosis: Secondary | ICD-10-CM | POA: Diagnosis not present

## 2024-01-06 DIAGNOSIS — Z85828 Personal history of other malignant neoplasm of skin: Secondary | ICD-10-CM | POA: Diagnosis not present

## 2024-01-23 ENCOUNTER — Ambulatory Visit: Attending: Cardiology

## 2024-01-23 LAB — CUP PACEART REMOTE DEVICE CHECK
Battery Remaining Longevity: 2 mo
Battery Voltage: 2.69 V
Brady Statistic AP VP Percent: 59.41 %
Brady Statistic AP VS Percent: 0.39 %
Brady Statistic AS VP Percent: 39.14 %
Brady Statistic AS VS Percent: 1.06 %
Brady Statistic RA Percent Paced: 57.24 %
Brady Statistic RV Percent Paced: 95.4 %
Date Time Interrogation Session: 20251221063524
HighPow Impedance: 73 Ohm
Implantable Lead Connection Status: 753985
Implantable Lead Connection Status: 753985
Implantable Lead Connection Status: 753985
Implantable Lead Implant Date: 20150121
Implantable Lead Implant Date: 20150121
Implantable Lead Implant Date: 20150121
Implantable Lead Location: 753858
Implantable Lead Location: 753859
Implantable Lead Location: 753860
Implantable Lead Model: 4298
Implantable Lead Model: 5076
Implantable Pulse Generator Implant Date: 20200717
Lead Channel Impedance Value: 189.525
Lead Channel Impedance Value: 189.525
Lead Channel Impedance Value: 193.707
Lead Channel Impedance Value: 199.5 Ohm
Lead Channel Impedance Value: 204.14 Ohm
Lead Channel Impedance Value: 361 Ohm
Lead Channel Impedance Value: 399 Ohm
Lead Channel Impedance Value: 399 Ohm
Lead Channel Impedance Value: 418 Ohm
Lead Channel Impedance Value: 418 Ohm
Lead Channel Impedance Value: 513 Ohm
Lead Channel Impedance Value: 513 Ohm
Lead Channel Impedance Value: 589 Ohm
Lead Channel Impedance Value: 608 Ohm
Lead Channel Impedance Value: 646 Ohm
Lead Channel Impedance Value: 665 Ohm
Lead Channel Impedance Value: 665 Ohm
Lead Channel Impedance Value: 703 Ohm
Lead Channel Pacing Threshold Amplitude: 0.5 V
Lead Channel Pacing Threshold Amplitude: 0.625 V
Lead Channel Pacing Threshold Amplitude: 3 V
Lead Channel Pacing Threshold Pulse Width: 0.4 ms
Lead Channel Pacing Threshold Pulse Width: 0.4 ms
Lead Channel Pacing Threshold Pulse Width: 1 ms
Lead Channel Sensing Intrinsic Amplitude: 15.625 mV
Lead Channel Sensing Intrinsic Amplitude: 15.625 mV
Lead Channel Sensing Intrinsic Amplitude: 2.25 mV
Lead Channel Sensing Intrinsic Amplitude: 2.25 mV
Lead Channel Setting Pacing Amplitude: 1.5 V
Lead Channel Setting Pacing Amplitude: 2 V
Lead Channel Setting Pacing Amplitude: 3 V
Lead Channel Setting Pacing Pulse Width: 0.4 ms
Lead Channel Setting Pacing Pulse Width: 1 ms
Lead Channel Setting Sensing Sensitivity: 0.3 mV
Zone Setting Status: 755011
Zone Setting Status: 755011

## 2024-01-24 ENCOUNTER — Encounter

## 2024-01-26 ENCOUNTER — Telehealth: Payer: Self-pay

## 2024-01-26 NOTE — Telephone Encounter (Signed)
 ICD reached RRT 01/26/24. Pt is due for upcoming apt 02/2023, needs to be scheduled to discuss gen change and 1 year f/u. Routing to staff to review.

## 2024-01-26 NOTE — Telephone Encounter (Signed)
 Spoke w/ patient - she is scheduled to see Jodie Passey, PA on 1/27.

## 2024-02-17 ENCOUNTER — Telehealth: Payer: Self-pay | Admitting: Student

## 2024-02-17 NOTE — Telephone Encounter (Signed)
 Pt states that for the last few weeks, every 2-3 days that her device has been beeping. She would like a c/b regarding this matter. Please addvise

## 2024-02-17 NOTE — Telephone Encounter (Signed)
 Spoke w/ patient regarding c/o device beeping every couple of days. Informed patient the audible tone she is hearing is d/t to the device reaching ERI. Verbalized understanding.   Informed patient she can come into device clinic and we can turn the ERI alert tone off. Patient states she would like to wait until her appointment w/ Prentice Passey, PA on 02/29/2024 and will have alert tone turned off at this time.   Informed patient to contact device clinic for any further questions or concerns. Appreciative for call.

## 2024-02-23 ENCOUNTER — Ambulatory Visit: Attending: Cardiology

## 2024-02-24 ENCOUNTER — Encounter

## 2024-02-25 LAB — CUP PACEART REMOTE DEVICE CHECK
Battery Remaining Longevity: 1 mo — CL
Battery Voltage: 2.7 V
Brady Statistic AP VP Percent: 63.6 %
Brady Statistic AP VS Percent: 0.66 %
Brady Statistic AS VP Percent: 34.57 %
Brady Statistic AS VS Percent: 1.17 %
Brady Statistic RA Percent Paced: 61.7 %
Brady Statistic RV Percent Paced: 95.09 %
Date Time Interrogation Session: 20260121073724
HighPow Impedance: 72 Ohm
Implantable Lead Connection Status: 753985
Implantable Lead Connection Status: 753985
Implantable Lead Connection Status: 753985
Implantable Lead Implant Date: 20150121
Implantable Lead Implant Date: 20150121
Implantable Lead Implant Date: 20150121
Implantable Lead Location: 753858
Implantable Lead Location: 753859
Implantable Lead Location: 753860
Implantable Lead Model: 4298
Implantable Lead Model: 5076
Implantable Pulse Generator Implant Date: 20200717
Lead Channel Impedance Value: 193.707
Lead Channel Impedance Value: 193.707
Lead Channel Impedance Value: 205.114
Lead Channel Impedance Value: 209 Ohm
Lead Channel Impedance Value: 222.34 Ohm
Lead Channel Impedance Value: 361 Ohm
Lead Channel Impedance Value: 418 Ohm
Lead Channel Impedance Value: 418 Ohm
Lead Channel Impedance Value: 456 Ohm
Lead Channel Impedance Value: 456 Ohm
Lead Channel Impedance Value: 475 Ohm
Lead Channel Impedance Value: 532 Ohm
Lead Channel Impedance Value: 551 Ohm
Lead Channel Impedance Value: 665 Ohm
Lead Channel Impedance Value: 665 Ohm
Lead Channel Impedance Value: 703 Ohm
Lead Channel Impedance Value: 703 Ohm
Lead Channel Impedance Value: 722 Ohm
Lead Channel Pacing Threshold Amplitude: 0.5 V
Lead Channel Pacing Threshold Amplitude: 0.625 V
Lead Channel Pacing Threshold Amplitude: 2.375 V
Lead Channel Pacing Threshold Pulse Width: 0.4 ms
Lead Channel Pacing Threshold Pulse Width: 0.4 ms
Lead Channel Pacing Threshold Pulse Width: 1 ms
Lead Channel Sensing Intrinsic Amplitude: 17.875 mV
Lead Channel Sensing Intrinsic Amplitude: 17.875 mV
Lead Channel Sensing Intrinsic Amplitude: 2.875 mV
Lead Channel Sensing Intrinsic Amplitude: 2.875 mV
Lead Channel Setting Pacing Amplitude: 1.5 V
Lead Channel Setting Pacing Amplitude: 2 V
Lead Channel Setting Pacing Amplitude: 3 V
Lead Channel Setting Pacing Pulse Width: 0.4 ms
Lead Channel Setting Pacing Pulse Width: 1 ms
Lead Channel Setting Sensing Sensitivity: 0.3 mV
Zone Setting Status: 755011
Zone Setting Status: 755011

## 2024-02-29 ENCOUNTER — Ambulatory Visit: Payer: Self-pay | Admitting: Cardiology

## 2024-02-29 ENCOUNTER — Encounter: Payer: Self-pay | Admitting: Student

## 2024-02-29 ENCOUNTER — Telehealth: Payer: Self-pay

## 2024-02-29 ENCOUNTER — Ambulatory Visit: Attending: Student | Admitting: Student

## 2024-02-29 VITALS — BP 124/74 | HR 81 | Ht 67.0 in | Wt 178.0 lb

## 2024-02-29 DIAGNOSIS — I428 Other cardiomyopathies: Secondary | ICD-10-CM

## 2024-02-29 DIAGNOSIS — I5022 Chronic systolic (congestive) heart failure: Secondary | ICD-10-CM

## 2024-02-29 DIAGNOSIS — I493 Ventricular premature depolarization: Secondary | ICD-10-CM | POA: Diagnosis not present

## 2024-02-29 LAB — CUP PACEART INCLINIC DEVICE CHECK
Battery Remaining Longevity: 1 mo — CL
Battery Voltage: 2.69 V
Brady Statistic AP VP Percent: 64.18 %
Brady Statistic AP VS Percent: 0.45 %
Brady Statistic AS VP Percent: 34.35 %
Brady Statistic AS VS Percent: 1.01 %
Brady Statistic RA Percent Paced: 61.98 %
Brady Statistic RV Percent Paced: 95.47 %
Date Time Interrogation Session: 20260127110351
HighPow Impedance: 72 Ohm
Implantable Lead Connection Status: 753985
Implantable Lead Connection Status: 753985
Implantable Lead Connection Status: 753985
Implantable Lead Implant Date: 20150121
Implantable Lead Implant Date: 20150121
Implantable Lead Implant Date: 20150121
Implantable Lead Location: 753858
Implantable Lead Location: 753859
Implantable Lead Location: 753860
Implantable Lead Model: 4298
Implantable Lead Model: 5076
Implantable Pulse Generator Implant Date: 20200717
Lead Channel Impedance Value: 184.154
Lead Channel Impedance Value: 188.1 Ohm
Lead Channel Impedance Value: 195.429
Lead Channel Impedance Value: 204.14 Ohm
Lead Channel Impedance Value: 212.8 Ohm
Lead Channel Impedance Value: 342 Ohm
Lead Channel Impedance Value: 399 Ohm
Lead Channel Impedance Value: 418 Ohm
Lead Channel Impedance Value: 456 Ohm
Lead Channel Impedance Value: 456 Ohm
Lead Channel Impedance Value: 456 Ohm
Lead Channel Impedance Value: 551 Ohm
Lead Channel Impedance Value: 589 Ohm
Lead Channel Impedance Value: 608 Ohm
Lead Channel Impedance Value: 646 Ohm
Lead Channel Impedance Value: 703 Ohm
Lead Channel Impedance Value: 722 Ohm
Lead Channel Impedance Value: 722 Ohm
Lead Channel Pacing Threshold Amplitude: 0.5 V
Lead Channel Pacing Threshold Amplitude: 0.625 V
Lead Channel Pacing Threshold Amplitude: 3.25 V
Lead Channel Pacing Threshold Pulse Width: 0.4 ms
Lead Channel Pacing Threshold Pulse Width: 0.4 ms
Lead Channel Pacing Threshold Pulse Width: 1 ms
Lead Channel Sensing Intrinsic Amplitude: 18 mV
Lead Channel Sensing Intrinsic Amplitude: 18 mV
Lead Channel Sensing Intrinsic Amplitude: 2.875 mV
Lead Channel Sensing Intrinsic Amplitude: 2.875 mV
Lead Channel Setting Pacing Amplitude: 1.5 V
Lead Channel Setting Pacing Amplitude: 2 V
Lead Channel Setting Pacing Amplitude: 3 V
Lead Channel Setting Pacing Pulse Width: 0.4 ms
Lead Channel Setting Pacing Pulse Width: 1 ms
Lead Channel Setting Sensing Sensitivity: 0.3 mV
Zone Setting Status: 755011
Zone Setting Status: 755011

## 2024-02-29 NOTE — Progress Notes (Signed)
" °  Electrophysiology Office Note:   ID:  Nelani, Schmelzle October 22, 1932, MRN [id number]  Primary Cardiologist: None Electrophysiologist: Will Gladis Norton, MD      History of Present Illness:   Heather Smith is a 89 y.o. female with h/o NICM, chronic systolic CHF, LBBB, s/p CRT-D, PVCs, fatigue, and HTN  seen today for routine electrophysiology followup for device at [facility].   Since last being seen in our clinic the patient reports doing well overall.  She does have some fatigue, but overall is not very active outside of ADLs. Follows regularly with HF clinic and EF has been normalized for several years. Overall, she denies chest pain, palpitations, PND, orthopnea, nausea, vomiting, dizziness, syncope, edema, weight gain, or early satiety.   Review of systems complete and found to be negative unless listed in HPI.   EP Information / Studies Reviewed:    EKG is ordered today. Personal review as below.  EKG Interpretation Date/Time:  Tuesday February 29 2024 10:37:06 EST Ventricular Rate:  81 PR Interval:  166 QRS Duration:  190 QT Interval:  456 QTC Calculation: 529 R Axis:   113  Text Interpretation: AV dual-paced rhythm Confirmed by Lesia Heck ([id number]) on 02/29/2024 10:40:14 AM    ICD Interrogation-  reviewed in detail today,  See PACEART report.  Arrhythmia/Device History Medtronic Bi V ICD implanted 2015 for CHF, Gen change 2020    Physical Exam:   VS:  BP 124/74   Pulse 81   Ht 5' 7 (1.702 m)   Wt 178 lb (80.7 kg)   SpO2 95%   BMI 27.88 kg/m    Wt Readings from Last 3 Encounters:  02/29/24 178 lb (80.7 kg)  11/09/23 187 lb (84.8 kg)  06/01/23 188 lb 12.8 oz (85.6 kg)     GEN: No acute distress  NECK: No JVD; No carotid bruits CARDIAC: Regular rate and rhythm, no murmurs, rubs, gallops RESPIRATORY:  Clear to auscultation without rales, wheezing or rhonchi  ABDOMEN: Soft, non-tender, non-distended EXTREMITIES:  No edema; No deformity   ASSESSMENT AND PLAN:     Chronic systolic CHF  s/p Medtronic CRT-D  EF remains normalized euvolemic today Stable on an appropriate medical regimen Normal ICD function See Pace Art report No changes today She would desire ICD therapies if warranted, and her DF4 ICD lead prevents down grade anyway.  If goals of care change in the future, would require re-programming.   Elevated LV threshold Stable today at 2.5V @ 1.31ms.  Other vectors not tested today, so additional programming may also be available.   PVCs EKG today shows NSR with stable intervals Continue flecainide  50 mg BID  Disposition:   Follow up with EP Team as usual post procedure   Signed, Ozell Prentice Lesia, PA-C  "

## 2024-02-29 NOTE — Patient Instructions (Signed)
 Medication Instructions:  Your physician recommends that you continue on your current medications as directed. Please refer to the Current Medication list given to you today.  *If you need a refill on your cardiac medications before your next appointment, please call your pharmacy*  Lab Work: BMET, CBC-TODAY If you have labs (blood work) drawn today and your tests are completely normal, you will receive your results only by: MyChart Message (if you have MyChart) OR A paper copy in the mail If you have any lab test that is abnormal or we need to change your treatment, we will call you to review the results.  Testing/Procedures: See letter  Follow-Up: At [facility], you and your health needs are our priority.  As part of our continuing mission to provide you with exceptional heart care, our providers are all part of one team.  This team includes your primary Cardiologist (physician) and Advanced Practice Providers or APPs (Physician Assistants and Nurse Practitioners) who all work together to provide you with the care you need, when you need it.  Your next appointment:   Follow will be arranged for you and be printed out on your discharge summary after your procedure.

## 2024-02-29 NOTE — Telephone Encounter (Signed)
-----   Message from Nurse Aldona SAUNDERS, RN sent at 02/29/2024 11:10 AM EST ----- Regarding: 02/18 BiV-ICD gen change 230 [place] Important: list procedure date as first item in subject line, followed by procedure type (e.g., 10/15/23 PPM implant)  Precert:  MD: Cam[place] Type of implant: CRT-D Device manufacturer: Medtronic Diagnosis: SRI CPT code: CRT-D change out (no LV lead change) - 33264 C-code(s), including quantity (if indicated):  Procedure scheduled (date/time): 02/18 230pm  Procedure:  Scrub given? Yes  Medication instructions: Routine Message sent to CVRR? No Added to calendar? Yes Orders entered? Yes Letter complete? Yes Scheduled with cath lab? Yes Labs ordered (CBC, BMET, PT/INR if on warfarin)? Yes Dye allergy? No Pre-meds ordered and instructions given? N/A Letter method: Patient pick-up Special instructions:  H&P: 01/27  Follow-up:  Cassie/Angel, please schedule Routine.  Covering RN:  Please send this message to [employer], EP scheduler, EP Scheduling pool, and EP [employer].

## 2024-02-29 NOTE — Addendum Note (Signed)
 Addended by: CASIMIR ALDONA BRAVO on: 02/29/2024 11:19 AM   Modules accepted: Orders

## 2024-03-01 LAB — CBC
Hematocrit: 47.9 % — ABNORMAL HIGH (ref 34.0–46.6)
Hemoglobin: 15.6 g/dL (ref 11.1–15.9)
MCH: 31 pg (ref 26.6–33.0)
MCHC: 32.6 g/dL (ref 31.5–35.7)
MCV: 95 fL (ref 79–97)
Platelets: 189 10*3/uL (ref 150–450)
RBC: 5.03 x10E6/uL (ref 3.77–5.28)
RDW: 12.3 % (ref 11.7–15.4)
WBC: 6 10*3/uL (ref 3.4–10.8)

## 2024-03-01 LAB — BASIC METABOLIC PANEL WITH GFR
BUN/Creatinine Ratio: 15 (ref 12–28)
BUN: 19 mg/dL (ref 8–27)
CO2: 24 mmol/L (ref 20–29)
Calcium: 10 mg/dL (ref 8.7–10.3)
Chloride: 102 mmol/L (ref 96–106)
Creatinine, Ser: 1.24 mg/dL — AB (ref 0.57–1.00)
Glucose: 119 mg/dL — ABNORMAL HIGH (ref 70–99)
Potassium: 4.6 mmol/L (ref 3.5–5.2)
Sodium: 144 mmol/L (ref 134–144)
eGFR: 42 mL/min/{1.73_m2} — AB

## 2024-03-08 NOTE — Progress Notes (Signed)
 " Patient ID: Heather Smith, female   DOB: [birth year], 89 y.o.   MRN: [id number] PCP: Dr. Arloa HF Cardiology: Rolan EP: Dr. Fernande   Chief complaint: CHF  89 y.o. with history of nonischemic cardiomyopathy and chronic systolic HF.  In 9/14, she was admitted to [facility] with chest pain.  Cardiac cath was done, showing no significant coronary disease.  However, echo showed EF 30-35%.  She was started on meds for CHF, and echo was repeated in 12/14, EF was read as 20-25%.  She had a Medtonic CRT-D device placed (had baseline LBBB).  In 3/15, the LV lead was not capturing properly so device had to be reprogrammed.  She had AV optimization in 5/15.  Limited echo at that time showed improvement in EF to 40-45%.  CPX in 5/15 was submaximal but suggested low normal to mildly reduced functional capacity. Echo in 6/16 showed EF 35-40% with diffuse hypokinesis.  CPX was done in 9/16.  This actually showed good functional capacity. She had repeat echo in 1/17, showing EF increased to 60-65%.  Most recent echo in 8/18 showed EF 50-55% with mild LV dilation and mild MR.    She was seen by Dr. Fernande for frequent PVCs (symptomatic).  She was started on flecainide .    Atypical chest pain in 11/19, Cardiolite  was normal.   Repeat echo in 1/20 showed EF 50-55%, moderate MR.    PYP scan in 1/21 was not suggestive of TTR amyloidosis.  Echo in 5/21 showed EF 50-55%, normal RV, mild MR.   She was admitted 12/22 with near syncope and AKI due to hypotension in setting of COVID-19 infection. SCr 1.5 on admit (baseline ~1.1). Also had brief episodes of atrial flutter, captured on device interrogation (longest duration 8 min). Head CT was negative. She was treated by general cardiology. Per documentation, there was no evidence of  acute HF decompensation/hypervolemia. Coreg , Entresto  and diuretics were initially held. Gentle IVF hydration administered. AKI resolved and BP improved. She had no further AFL/SVT.  Echo showed  EF 55-60%. RV ok. No significant valvular dysfunction. HF meds were reintroduced prior to d/c, lasix  PRN. She completed antiviral Molpunavir for COVID. Scr 1.07 at discharge.   Echo 11/23 showed EF 60-65%, normal RV, moderate MR.  S/p R TKA 4/24.  Echo was done today and reviewed, EF 60%, normal RV, mild MR.   Today she returns for HF follow up with her son.  She has been doing well generally.  She gets short of breath walking up a hill.  She fatigue easily. No chest pain.  No lightheadedness/dizziness.  She has not had to use Lasix .  Weight down 1 lb.   ECG (personally reviewed): A-BiV dual pacing  Medtronic device interrogation (personally reviewed): Thoracic impedance trending down but fluid index still < threshold, 95% BiV pacing, no AF/VT.   Labs (10/23): K 4.7, creatinine 1.17, normal LFTs, LDL 74, HDL 38, TGs 214 Labs (7/24): K 4.9, creatinine 1.08 Labs (4/25): LDL 71 Labs (5/25): K 4.7, creatinine 1.02  PMH: 1. Chronic systolic CHF: Nonischemic cardiomyopathy.  LHC (9/14) with no significant coronary disease, EF 25-30% with global hypokinesis.  Echo (9/14) with EF 30-35%, moderate LV dilation, diffuse hypokinesis.  Echo (12/14) with EF 20-25%.  She does not drink ETOH.  She had Medtronic CRT-D device implantation in 1/15. Echo (5/15) with EF 40-45%, mild LVH.  CPX (5/15) was submaximal with RER 0.99, peak VO2 14, VE/VCO2 37.3; low normal functional capacity may be limited  by loss of BiV pacing at higher HR and by deconditioning but interpretation somewhat limited since study was submaximal.  Echo (6/16) with EF 35-40%, severe LV dilation, diffuse hypokinesis, mild MR.  CPX (9/16) with peak VO2 17.7, VE/VCO2 slope 32.3, RER 1.11 => excellent functional capacity. Echo (11/17) with EF 60-65%.  - Echo (8/18): EF 50-55%, mildly dilated LV, mild MR.  - Echo (1/20): EF 50-55%, moderate MR.  - PYP scan (1/21): H/CL 1.0, grade 1.  Not suggestive of TTR amyloidosis.  - Echo (5/21): EF 50-55%,  normal RV, mild MR. - Ltd echo (12/22): EF 55-60% - Echo (11/23): EF 60-65%, normal RV, moderate MR - Echo (10/25): EF 60%, normal RV, mild MR.  2. Back pain s/p lumbar laminectomy 3. OA: Knees. Left TKR in 6/17.  4. Hyperlipidemia 5. Depression 6. PVCs 7. Peripheral neuropathy: Uncertain etiology.  She is not a diabetic.  8. Lung nodule: Last CT chest in 1/19 showed stable 6 mm LLL nodule, no further followup recommended.  9. Chest pain: Cardiolite  (11/19) with EF 74%, no evidence for ischemia/infarction.   SH: Lives [place], married, never smoked, no ETOH.    FH: Mother with MI at 59, possible ischemic cardiomyopathy.  Father with cardiomyopathy, died at 52.  She has 5 brothers, none with significant coronary disease.    ROS: All systems reviewed and negative except as per HPI.   Current Outpatient Medications  Medication Sig Dispense Refill   acetaminophen  (TYLENOL ) 325 MG tablet Take 1-2 tablets (325-650 mg total) by mouth every 4 (four) hours as needed for moderate pain, headache or mild pain. 60 tablet 0   carvedilol  (COREG ) 6.25 MG tablet TAKE 1 TABLET BY MOUTH TWICE DAILY WITH MEALS 60 tablet 11   Cholecalciferol  (VITAMIN D -3) 25 MCG (1000 UT) CAPS Take 1,000 Units by mouth daily.     diclofenac Sodium (VOLTAREN) 1 % GEL Apply 1 Application topically at bedtime.     DULoxetine  (CYMBALTA ) 60 MG capsule Take 60 mg by mouth daily.     ezetimibe  (ZETIA ) 10 MG tablet Take 1 tablet by mouth once daily 90 tablet 2   FARXIGA  10 MG TABS tablet Take 1 tablet (10 mg total) by mouth daily before breakfast. 90 tablet 3   flecainide  (TAMBOCOR ) 50 MG tablet TAKE 1 TABLET BY MOUTH TWICE DAILY 60 tablet 11   furosemide  (LASIX ) 20 MG tablet TAKE 1 TABLET BY MOUTH DAILY AS NEEDED FOR FLUID * NO MORE THAN TWICE PER WEEK* 8 tablet 11   levothyroxine  (SYNTHROID , LEVOTHROID) 50 MCG tablet Take 50 mcg by mouth daily before breakfast.     lidocaine  (XYLOCAINE ) 5 % ointment Apply 1 Application  topically as needed (for neuropathy pain). 35.44 g 0   losartan  (COZAAR ) 25 MG tablet Take 1 tablet (25 mg total) by mouth daily. 90 tablet 1   Magnesium  250 MG TABS Take 250 mg by mouth daily.     Multiple Vitamins-Minerals (MULTIVITAMIN WITH MINERALS) tablet Take 1 tablet by mouth daily.     OVER THE COUNTER MEDICATION Take 2 each by mouth daily. Circulation Sweets Gummies     pantoprazole  (PROTONIX ) 40 MG tablet Take 40 mg by mouth 2 (two) times daily.     rosuvastatin  (CRESTOR ) 10 MG tablet Take 10 mg by mouth 3 (three) times a week.     sennosides-docusate sodium  (SENOKOT-S) 8.6-50 MG tablet Take 2 tablets by mouth daily. 30 tablet 1   spironolactone  (ALDACTONE ) 25 MG tablet Take 1 tablet (25 mg  total) by mouth daily. 90 tablet 3   traMADol  (ULTRAM ) 50 MG tablet Take 50 mg by mouth daily as needed.     traZODone  (DESYREL ) 50 MG tablet Take 50 mg by mouth at bedtime.     vitamin B-12 (CYANOCOBALAMIN ) 1000 MCG tablet Take 1,000 mcg by mouth daily.     zinc  gluconate 50 MG tablet Take 50 mg by mouth daily.     No current facility-administered medications for this visit.   There were no vitals taken for this visit.  Wt Readings from Last 3 Encounters:  02/29/24 80.7 kg (178 lb)  11/09/23 84.8 kg (187 lb)  06/01/23 85.6 kg (188 lb 12.8 oz)   PHYSICAL EXAM: General: NAD Neck: No JVD, no thyromegaly or thyroid  nodule.  Lungs: Clear to auscultation bilaterally with normal respiratory effort. CV: Nondisplaced PMI.  Heart regular S1/S2, no S3/S4, no murmur.  No peripheral edema.  No carotid bruit.  Normal pedal pulses.  Abdomen: Soft, nontender, no hepatosplenomegaly, no distention.  Skin: Intact without lesions or rashes.  Neurologic: Alert and oriented x 3.  Psych: Normal affect. Extremities: No clubbing or cyanosis.  HEENT: Normal.    Assessment/Plan: 1. Chronic systolic CHF: Nonischemic cardiomyopathy s/p Medtronic CRT-D, she was a responder to CRT. EF 35-40% (6/16). She does have  a family history of cardiomyopathy of uncertain etiology (father) but none of her siblings developed a cardiomyopathy.  Familial cardiomyopathy is a consideration.  Viral myocarditis is also a possible etiology.  Echo in 1/17 showed improvement in EF to 60-65%, echo in 8/18 showed EF 50-55% and echo in 1/20 showed stable EF 50-55%.  PYP scan not suggestive of TTR amyloidosis. Echo in 5/21 showed EF 50-55%, normal RV, mild MR. Echo 12/22 EF 55-60%, RV ok. Echo 11/23 showed EF 60-65%, normal RV, moderate MR. Echo today was reviewed and showed EF 60%, normal RV, mild MR.  NYHA II. Not volume overloaded on exam though thoracic impedance is decreasing on Optivol.  - Continue spironolactone  25 mg daily. - Continue losartan  25 mg daily. - Continue Coreg  6.25 mg bid . - Add Farxiga  10 mg daily. BMET/BNP today, BMET in 10 days.   2. PVCs: Followed by EP, now on flecainide . Minimal palpitations now.  - Continue flecainide  50 mg bid.  Will need nodal blocking agent while on flecainide , she is on Coreg .  3. Peripheral neuropathy: She has had a bothersome peripheral neuropathy. PYP scan not suggestive of TTR amyloidosis.                    4. HTN: BP stable today. 5. PAD: Abnormal ABIs on insurance screening.  She has peripheral neuropathy symptoms but no definite claudication. LE arterial dopplers 10/22 w/ normal ABIs   Follow up in 4 months with APP  I spent 32 minutes reviewing records, interviewing/examining patient, and managing orders.   Harlene HERO Buras,  03/08/2024 "

## 2024-03-09 ENCOUNTER — Telehealth (HOSPITAL_COMMUNITY): Payer: Self-pay

## 2024-03-09 NOTE — Telephone Encounter (Signed)
 Called to confirm/remind patient of their appointment at the [facility] on 03/10/24.   Appointment:   [x] Confirmed  [] Left mess   [] No answer/No voice mail  [] VM Full/unable to leave message  [] Phone not in service  Patient reminded to bring all medications and/or complete list.  Confirmed patient has transportation. Gave directions, instructed to utilize valet parking.

## 2024-03-09 NOTE — Telephone Encounter (Signed)
 Attempted to reach patient to discuss upcoming procedure, no answer. Left VM for patient to return call.

## 2024-03-10 ENCOUNTER — Encounter (HOSPITAL_COMMUNITY): Payer: Self-pay

## 2024-03-10 ENCOUNTER — Ambulatory Visit (HOSPITAL_COMMUNITY): Admission: RE | Admit: 2024-03-10 | Source: Ambulatory Visit

## 2024-03-10 VITALS — BP 136/84 | HR 82 | Wt 178.0 lb

## 2024-03-10 DIAGNOSIS — I10 Essential (primary) hypertension: Secondary | ICD-10-CM

## 2024-03-10 DIAGNOSIS — R5383 Other fatigue: Secondary | ICD-10-CM

## 2024-03-10 DIAGNOSIS — I739 Peripheral vascular disease, unspecified: Secondary | ICD-10-CM

## 2024-03-10 DIAGNOSIS — G629 Polyneuropathy, unspecified: Secondary | ICD-10-CM

## 2024-03-10 DIAGNOSIS — I493 Ventricular premature depolarization: Secondary | ICD-10-CM

## 2024-03-10 DIAGNOSIS — I5022 Chronic systolic (congestive) heart failure: Secondary | ICD-10-CM

## 2024-03-10 LAB — TSH: TSH: 1.45 u[IU]/mL (ref 0.350–4.500)

## 2024-03-10 LAB — IRON AND TIBC
Iron: 102 ug/dL (ref 28–170)
Saturation Ratios: 24 % (ref 10.4–31.8)
TIBC: 420 ug/dL (ref 250–450)
UIBC: 318 ug/dL

## 2024-03-10 LAB — FERRITIN: Ferritin: 28 ng/mL (ref 11–307)

## 2024-03-10 MED ORDER — LOSARTAN POTASSIUM 25 MG PO TABS: 12.5000 mg | ORAL_TABLET | Freq: Every day | ORAL | 1 refills | Status: AC

## 2024-03-10 NOTE — Patient Instructions (Addendum)
 Good to see you today!  DECREASE losartan  to 12.5 mg ( 1/2 tablet) every night  Labs done today, your results will be available in MyChart, we will contact you for abnormal readings.  Your physician recommends that you schedule a follow-up appointment 6 months (August) Call office in June to schedule an appointment  Check b/p daily and log   Left arm and at same time each day  If you have any questions or concerns before your next appointment please send us  a message through [facility] or call our office at [phone].    TO LEAVE A MESSAGE FOR THE NURSE SELECT OPTION 2, PLEASE LEAVE A MESSAGE INCLUDING: YOUR NAME DATE OF BIRTH CALL BACK NUMBER REASON FOR CALL**this is important as we prioritize the call backs  YOU WILL RECEIVE A CALL BACK THE SAME DAY AS LONG AS YOU CALL BEFORE 4:00 PM At the [facility], you and your health needs are our priority. As part of our continuing mission to provide you with exceptional heart care, we have created designated Provider Care Teams. These Care Teams include your primary Cardiologist (physician) and Advanced Practice Providers (APPs- Physician Assistants and Nurse Practitioners) who all work together to provide you with the care you need, when you need it.   You may see any of the following providers on your designated Care Team at your next follow up: Dr Toribio Fuel Dr Ezra Shuck Dr. Morene Brownie Greig Mosses, NP Caffie Shed, [place] [facility] [place], [place] Beckey Coe, NP Jordan Lee, NP Ellouise Class, NP Tinnie Redman, PharmD Jaun Bash, PharmD   Please be sure to bring in all your medications bottles to every appointment.    Thank you for choosing [facility]

## 2024-03-22 ENCOUNTER — Ambulatory Visit (HOSPITAL_COMMUNITY): Admit: 2024-03-22 | Admitting: Cardiology

## 2024-03-22 ENCOUNTER — Encounter (HOSPITAL_COMMUNITY): Payer: Self-pay

## 2024-03-25 ENCOUNTER — Ambulatory Visit

## 2024-03-27 ENCOUNTER — Encounter

## 2024-04-25 ENCOUNTER — Ambulatory Visit

## 2024-05-26 ENCOUNTER — Ambulatory Visit

## 2024-06-26 ENCOUNTER — Ambulatory Visit
